# Patient Record
Sex: Female | Born: 1945 | Race: White | Hispanic: No | Smoking: Former smoker
Health system: Southern US, Community
[De-identification: ages and names within clinical notes are randomized; demographics above are authoritative.]

## PROBLEM LIST (undated history)

## (undated) ENCOUNTER — Inpatient Hospital Stay (HOSPITAL_COMMUNITY): Payer: Self-pay | Admitting: Orthopaedic Surgery

## (undated) ENCOUNTER — Encounter (HOSPITAL_COMMUNITY): Payer: Self-pay

## (undated) DIAGNOSIS — E039 Hypothyroidism, unspecified: Secondary | ICD-10-CM

## (undated) DIAGNOSIS — M169 Osteoarthritis of hip, unspecified: Secondary | ICD-10-CM

## (undated) DIAGNOSIS — I10 Essential (primary) hypertension: Secondary | ICD-10-CM

## (undated) DIAGNOSIS — E785 Hyperlipidemia, unspecified: Secondary | ICD-10-CM

## (undated) DIAGNOSIS — M1990 Unspecified osteoarthritis, unspecified site: Secondary | ICD-10-CM

## (undated) DIAGNOSIS — Z87442 Personal history of urinary calculi: Secondary | ICD-10-CM

## (undated) DIAGNOSIS — Z905 Acquired absence of kidney: Secondary | ICD-10-CM

## (undated) DIAGNOSIS — G4733 Obstructive sleep apnea (adult) (pediatric): Secondary | ICD-10-CM

## (undated) DIAGNOSIS — N390 Urinary tract infection, site not specified: Secondary | ICD-10-CM

## (undated) DIAGNOSIS — T8859XA Other complications of anesthesia, initial encounter: Secondary | ICD-10-CM

## (undated) DIAGNOSIS — Z978 Presence of other specified devices: Secondary | ICD-10-CM

## (undated) DIAGNOSIS — Z9989 Dependence on other enabling machines and devices: Secondary | ICD-10-CM

## (undated) DIAGNOSIS — R011 Cardiac murmur, unspecified: Secondary | ICD-10-CM

## (undated) DIAGNOSIS — R9389 Abnormal findings on diagnostic imaging of other specified body structures: Secondary | ICD-10-CM

## (undated) DIAGNOSIS — R519 Headache, unspecified: Secondary | ICD-10-CM

## (undated) DIAGNOSIS — R350 Frequency of micturition: Secondary | ICD-10-CM

## (undated) DIAGNOSIS — Z973 Presence of spectacles and contact lenses: Secondary | ICD-10-CM

## (undated) DIAGNOSIS — Z8639 Personal history of other endocrine, nutritional and metabolic disease: Secondary | ICD-10-CM

## (undated) DIAGNOSIS — C801 Malignant (primary) neoplasm, unspecified: Secondary | ICD-10-CM

## (undated) DIAGNOSIS — C649 Malignant neoplasm of unspecified kidney, except renal pelvis: Secondary | ICD-10-CM

## (undated) DIAGNOSIS — K219 Gastro-esophageal reflux disease without esophagitis: Secondary | ICD-10-CM

## (undated) DIAGNOSIS — M1710 Unilateral primary osteoarthritis, unspecified knee: Secondary | ICD-10-CM

## (undated) HISTORY — DX: Gastro-esophageal reflux disease without esophagitis: K21.9

## (undated) HISTORY — DX: Hypothyroidism, unspecified: E03.9

## (undated) HISTORY — DX: Osteoarthritis of hip, unspecified: M16.9

## (undated) HISTORY — DX: Malignant neoplasm of unspecified kidney, except renal pelvis (CMS-HCC): C64.9

## (undated) HISTORY — PX: COLONOSCOPY: PRO007

## (undated) HISTORY — DX: Essential (primary) hypertension: I10

## (undated) HISTORY — DX: Hyperlipidemia, unspecified: E78.5

## (undated) HISTORY — DX: Unilateral primary osteoarthritis, unspecified knee: M17.10

## (undated) HISTORY — PX: PB BIOPSY OF BREAST, INCISIONAL: 19101

## (undated) HISTORY — PX: PB REMOVE TONSILS/ADENOIDS,<12 Y/O: 42820

## (undated) HISTORY — PX: OTHER PROCEDURE: U1053

## (undated) HISTORY — PX: TOTAL HIP ARTHROPLASTY: SHX124

## (undated) HISTORY — PX: WISDOM TOOTH EXTRACTION: SHX21

## (undated) HISTORY — PX: COLON SURGERY: SHX602

## (undated) HISTORY — PX: TONSILLECTOMY: SUR1361

## (undated) HISTORY — PX: PARATHYROIDECTOMY: SHX19

## (undated) SURGERY — ARTHROPLASTY, KNEE
Site: Knee | Laterality: Right

## (undated) MED ORDER — LACTATED RINGERS IV SOLN
INTRAVENOUS | Status: AC
Start: 2015-12-22 — End: ?

## (undated) MED ORDER — SIMVASTATIN 10 MG OR TABS
10.00 mg | ORAL_TABLET | Freq: Every evening | ORAL | Status: AC
Start: 2014-01-06 — End: ?

## (undated) MED ORDER — ASPIRIN 325 MG OR TABS
325.00 mg | ORAL_TABLET | Freq: Two times a day (BID) | ORAL | Status: AC
Start: 2014-01-07 — End: ?

## (undated) MED ORDER — LIDOCAINE HCL 1 % IJ SOLN
0.10 mL | INTRAMUSCULAR | Status: AC | PRN
Start: 2013-12-24 — End: ?

## (undated) MED ORDER — LISINOPRIL-HYDROCHLOROTHIAZIDE 10-12.5 MG OR TABS
1.00 | ORAL_TABLET | Freq: Every morning | ORAL | Status: AC
Start: 2014-01-06 — End: ?

## (undated) MED ORDER — LEVOTHYROXINE SODIUM 137 MCG OR TABS
137.00 ug | ORAL_TABLET | Freq: Every morning | ORAL | Status: AC
Start: 2014-01-06 — End: ?

## (undated) MED ORDER — LIDOCAINE HCL 1 % IJ SOLN
0.10 mL | INTRAMUSCULAR | Status: AC | PRN
Start: 2015-12-22 — End: ?

## (undated) MED ORDER — NALOXONE HCL 0.4 MG/ML IJ SOLN
0.10 mg | INTRAMUSCULAR | Status: AC | PRN
Start: 2015-12-24 — End: ?

## (undated) MED ORDER — LACTATED RINGERS IV SOLN
INTRAVENOUS | Status: AC
Start: 2013-12-24 — End: ?

---

## 1993-12-05 DIAGNOSIS — E890 Postprocedural hypothyroidism: Secondary | ICD-10-CM

## 1993-12-05 HISTORY — PX: PB THYROIDECTOMY TOTAL/COMPLETE: 60240

## 1993-12-05 HISTORY — DX: Postprocedural hypothyroidism: E89.0

## 1993-12-05 HISTORY — PX: TOTAL THYROIDECTOMY: SHX2547

## 2003-12-06 HISTORY — PX: LAPAROSCOPIC CHOLECYSTECTOMY: SUR755

## 2006-12-05 HISTORY — PX: CHOLECYSTECTOMY, LAP: 56340

## 2009-03-04 ENCOUNTER — Ambulatory Visit (INDEPENDENT_AMBULATORY_CARE_PROVIDER_SITE_OTHER): Payer: Self-pay | Admitting: Orthopaedic Surgery

## 2009-03-11 ENCOUNTER — Ambulatory Visit (INDEPENDENT_AMBULATORY_CARE_PROVIDER_SITE_OTHER): Payer: Self-pay | Admitting: Orthopaedic Surgery

## 2009-03-17 ENCOUNTER — Encounter (INDEPENDENT_AMBULATORY_CARE_PROVIDER_SITE_OTHER): Payer: Self-pay | Admitting: Orthopaedic Surgery

## 2009-03-18 ENCOUNTER — Ambulatory Visit (INDEPENDENT_AMBULATORY_CARE_PROVIDER_SITE_OTHER): Admitting: Orthopaedic Surgery

## 2009-03-18 MED ORDER — LEVOTHYROXINE SODIUM 137 MCG OR TABS: 137.00 ug | ORAL_TABLET | Freq: Every morning | ORAL | Status: AC

## 2009-03-18 MED ORDER — LISINOPRIL-HYDROCHLOROTHIAZIDE 10-12.5 MG OR TABS: 1.00 | ORAL_TABLET | Freq: Every morning | ORAL | Status: AC

## 2009-03-18 NOTE — Progress Notes (Signed)
 This office note has been dictated.

## 2009-03-19 NOTE — Progress Notes (Signed)
 See Angela Calhoun's note for details. I saw and evaluated the patient. I  have discussed the patient with the [facility] and agree with his findings and  the plan we developed as written.    CLINIC: ORTHOPEDICS [place]      REPORT TYPE: NOTE      Dictating Practitioner: Inda Mcglothen L. Reedy Biernat, P.A.     Staff Physician: Samara Deist, M.D.    DATE OF SERVICE: 03/18/2009    REASON FOR VISIT:Right hip and left knee pain.            HISTORY OF PRESENT ILLNESS: This patient is a pleasant 63 year old female  who presents to orthopedic clinic today for the first time for evaluation  of right hip and left knee pain. She has a long history of progressive  right anterior hip pain dating back just over 5 years. She denies any  specific injury to the right hip, simply progressive pain and now  increasing stiffness and disability. She says she has primarily anterior  hip pain which radiates down into the groin as well as significant  difficulty getting her shoe and sock on. She does take over-the-counter  anti-inflammatories to help with this pain, but is having more and more  difficulty controlling her pain. She denies any specific leg length  discrepancy and denies any radicular component to her pain.    She also reports increasing left knee pain over the last year. She  localizes the pain primarily medially. She says it is not quite as bad as  her hip pain but now rates it about 3/10 in intensity. She denies any  specific instability or swelling. She does report some decreased range of  motion in the knee, but overall it is still better than her hip. She has  not had any injections in her hip or her knee, has not had any formal  physical therapy, and again takes only over-the-counter  anti-inflammatories.    PAST MEDICAL HISTORY: Significant for hypothyroidism, as well as  hypertension.    PAST SURGICAL HISTORY: Noncontributory.    FAMILY HISTORY: Noncontributory.    SOCIAL HISTORY: She is married. She is retired. She does have  no  children. She is a nonsmoker and denies illicit drug use. She eats no  special diet.    REVIEW OF SYSTEMS: An 18-point review of systems is well documented on her  chart. She does have a history of hypertension which is well controlled by  her report. Otherwise 18-point review of systems is negative.    PHYSICAL EXAMINATION:  On examination, this is a well-nourished, well-developed 63 year old female  in no current distress. She is 5 feet 7 inches tall, weighs 240 pounds.  Her BMI is nearly 38. Her blood pressure is 146/90 today. Temperature is  97.1, pulse is 73. She does walk with a markedly antalgic gait and has  moderate difficulty climbing onto and off of the examination table today.  Examination of the lower extremities reveals intact skin and nontender  varicosities. The leg lengths are essentially equal to apparent leg length  testing today. Hip range of motion testing from right to left reveals  flexion to 90/100, flexion contracture 0/0, abduction 30/60, adduction  5/10, internal rotation 0/15 and external rotation 30/40. She does have  markedly positive impingement signs in the right hip as well as a positive  Stinchfield test today. She has minimal tenderness over the greater  trochanter today. Examination of the left knee reveals mild varus  deformity.  She is tender along the medial joint line. She has mild  pseudo-laxity with valgus stress but no AP instability. Range of motion is  from 5 to 125 degrees. There is no effusion today. Distal strength and  sensation is well-maintained and distal pulses are 2+ sign.    IMAGING STUDIES: Plain radiographs of the right hip and pelvis obtained  today demonstrate advanced osteoarthritis of the right hip with joint space  narrowing and osteophyte formation. The left hip shows only mild  degenerative changes. Radiographs of the left knee obtained today  including AP, lateral and merchant view demonstrate significant medial  compartment osteoarthritis with  nearly complete loss of articular cartilage  space as well as osteophyte formation and subchondral sclerosis.    IMPRESSION:  1. Right hip pain secondary to osteoarthritis.  2. Left knee pain secondary to medial compartment osteoarthritis.    PLAN: We have had a long discussion with the patient today regarding  clinical and radiographic findings. She has symptomatic right hip  osteoarthritis as well as symptomatic left knee osteoarthritis. Although  historically and on examination today it appears that her hip is far more  symptomatic than her knee. We have talked about treatment options for both  her hip and knee arthritis today. These include activity modifications,  physical therapy, anti-inflammatories, weight loss, corticosteroid  injections or ultimately hip or knee replacement. At this point it appears  that since her hip is the most symptomatic, we would consider proceeding  with total hip arthroplasty first. We have talked about the importance of  losing some weight prior to surgery and we have discussed the fact that we  would prefer that her BMI was down under 35 prior to proceeding with any  kind of surgical intervention. She actually is a very understanding of  this and was hoping to lose some weight prior to surgery anyway. We have  talked about appropriate activities including stationary cycling as well as  swimming to help her lose some weight and avoid significant impact loading  on her lower extremity joints. We have also talked about the appropriate  use of anti-inflammatories to help control her symptoms. At this point,  she is not interested in any kind of injections in her knee or her hip. We  have talked about total hip arthroplasty including the risks, benefits and  alternatives today and will plan on bringing her back in about 3 months to  evaluate her weight loss goals and at that point likely begin the process  of scheduling a right total hip arthroplasty. The patient was seen  and  evaluated by Dr. Heide Spark and the plan was formulated and presented to  the patient by Dr. Newman Pies.      Job #: [id number]                Edited and Electronically signed by:  Samara Deist, M.D. 03/19/2009 01:11 P    DD: 03/18/2009 DT: 03/18/2009 04:09 P DocNo.: [id number]  [id number]      Primary Care Physician:  Bishop Limbo M.D  [phone] [address]  [place], [place] [address]    cc::

## 2009-06-17 ENCOUNTER — Ambulatory Visit (INDEPENDENT_AMBULATORY_CARE_PROVIDER_SITE_OTHER): Admitting: Orthopaedic Surgery

## 2009-06-17 MED ORDER — NAPROXEN SODIUM 220 MG OR TABS
220.00 mg | ORAL_TABLET | Freq: Three times a day (TID) | ORAL | Status: DC | PRN
Start: ? — End: 2013-12-24

## 2009-06-17 MED ORDER — OMEPRAZOLE 20 MG OR CPDR
20.00 mg | DELAYED_RELEASE_CAPSULE | Freq: Every morning | ORAL | Status: DC
Start: ? — End: 2019-01-22

## 2009-06-17 MED ORDER — SIMVASTATIN 20 MG OR TABS
20.00 mg | ORAL_TABLET | Freq: Every evening | ORAL | Status: DC
Start: ? — End: 2013-12-24

## 2009-06-17 NOTE — Patient Instructions (Signed)
Joint Replacement Surgery Scheduling    Angela Calhoun  Surgery Scheduler for Dr. Scott Ball  Office: [phone]  Fax: [phone]  [email or web address]    INSTRUCTIONS:  -contact my surgery scheduler to begin the surgery scheduling process.  -Please have your calender ready when discussing surgery dates.  -Depending on your age/health, you may need preoperative medical clearance by your primary care physician or specialist.  Please contact your primary care physician as soon as possible.    [facility], [facility], [facility] [place]  [address]   [place], [place] [address]  [phone]    [facility]  [address]  [place], [place] [address]  [phone]

## 2009-06-17 NOTE — Progress Notes (Signed)
 Subjective: Angela Calhoun returns today for recheck of her right hip and followup on her efforts on weight loss prior to considering any surgical intervention.  She has lost between 5 and 10 pounds and continues to work hard with dietary efforts.  She continues to experience rather significant right hip pain and does intermittently have left knee but her right hip is far more symptomatic.  She returns today for further discussion of right total hip replacement.  She has been trying to ride a stationary bike but is having difficulty doing this regularly due to hip pain.  She describes anterior hip pain and stiffness progressive disability.    PAST MEDICAL HISTORY: Significant for hypothyroidism, as well as  hypertension.    PAST SURGICAL HISTORY: Noncontributory.    FAMILY HISTORY: Noncontributory.    SOCIAL HISTORY: She is married. She is retired. She does have no  children. She is a nonsmoker and denies illicit drug use. She eats no  special diet.    REVIEW OF SYSTEMS: An 18-point review of systems is well documented on her  chart. She does have a history of hypertension which is well controlled by  her report. Otherwise 18-point review of systems is negative.    PHYSICAL EXAMINATION:  On examination, this is a well-nourished, well-developed 63 year old female  in no current distress. She is 5 feet 7 inches tall, weighs 240 pounds.  Her BMI is nearly 38. Her blood pressure is 146/90 today. Temperature is  97.1, pulse is 73. She does walk with a markedly antalgic gait and has  moderate difficulty climbing onto and off of the examination table today.  Examination of the lower extremities reveals intact skin and nontender  varicosities. The leg lengths are essentially equal to apparent leg length  testing today. Hip range of motion testing from right to left reveals  flexion to 90/100, flexion contracture 0/0, abduction 30/60, adduction  5/10, internal rotation 0/15 and external rotation 30/40. She  does have  markedly positive impingement signs in the right hip as well as a positive  Stinchfield test today. She has minimal tenderness over the greater  trochanter today. Examination of the left knee reveals mild varus  deformity. She is tender along the medial joint line. She has mild  pseudo-laxity with valgus stress but no AP instability. Range of motion is  from 5 to 125 degrees. There is no effusion today. Distal strength and  sensation is well-maintained and distal pulses are 2+ sign.    IMAGING STUDIES: Plain radiographs of the right hip and pelvis obtained  today demonstrate advanced osteoarthritis of the right hip with joint space  narrowing and osteophyte formation. The left hip shows only mild  degenerative changes. Radiographs of the left knee obtained today  including AP, lateral and merchant view demonstrate significant medial  compartment osteoarthritis with nearly complete loss of articular cartilage  space as well as osteophyte formation and subchondral sclerosis.    Plan: We have had a long discussion today regarding clinical and radiographic findings.  She certainly has endstage osteoarthritis of her right hip radiographically and has been trying hard to lose weight as per our recommendations.  She has been making some progress and we will go ahead and begin the scheduling process for her today.  We have requested that she continue to work hard on her weight loss efforts right up to the time of surgery.  At this point she would like to hold on any intervention for her left knee and will  proceed with a right total hip arthroplasty.  She has been provided with a class schedule today and has been provided a handout on total hip placement for her education.  All of her questions were answered today and she was provided Judeen Hammans his contact information.The patient was seen and evaluated by Dr. Newman Pies and the plan was formulated by Dr. Newman Pies and presented to the patient by him.

## 2009-06-19 NOTE — Progress Notes (Signed)
 ORTHO STAFF ADDENDUM:  See Magnus Sinning' note for details.  I saw and evaluated the patient.  I have discussed the patient with [facility] and agree with his findings and the plan we developed as written.    Angela Calhoun has significant right hip arthritis. She is overweight but has been making some progress toward losing weight.  We will start the scheduling process and she assured me she will continue with her weight loss program.  We discussed risks/benefits/rehab/expected outcomes of surgery today.  I answered all of her questions and provided her with a booklet about the surgery and also a [place] joint replacement class schedule.

## 2009-06-22 ENCOUNTER — Encounter (INDEPENDENT_AMBULATORY_CARE_PROVIDER_SITE_OTHER): Payer: Self-pay | Admitting: Orthopaedic Surgery

## 2009-09-09 ENCOUNTER — Telehealth (INDEPENDENT_AMBULATORY_CARE_PROVIDER_SITE_OTHER): Payer: Self-pay | Admitting: Orthopaedic Surgery

## 2009-09-29 ENCOUNTER — Encounter (INDEPENDENT_AMBULATORY_CARE_PROVIDER_SITE_OTHER): Payer: Self-pay | Admitting: Physician Assistant

## 2009-09-29 ENCOUNTER — Encounter (INDEPENDENT_AMBULATORY_CARE_PROVIDER_SITE_OTHER): Payer: Self-pay | Admitting: Anesthesiology

## 2009-11-11 ENCOUNTER — Encounter (INDEPENDENT_AMBULATORY_CARE_PROVIDER_SITE_OTHER): Payer: Self-pay | Admitting: Orthopaedic Surgery

## 2009-12-05 HISTORY — PX: OTHER PROCEDURE: U1053

## 2010-01-26 ENCOUNTER — Encounter (INDEPENDENT_AMBULATORY_CARE_PROVIDER_SITE_OTHER): Payer: Self-pay | Admitting: Orthopaedic Surgery

## 2010-01-28 ENCOUNTER — Ambulatory Visit (INDEPENDENT_AMBULATORY_CARE_PROVIDER_SITE_OTHER): Admitting: Orthopaedic Surgery

## 2010-01-28 VITALS — BP 113/57 | HR 57 | Temp 97.1°F | Ht 67.0 in | Wt 246.0 lb

## 2010-01-28 MED ORDER — HYDROCODONE-ACETAMINOPHEN 5-500 MG OR TABS
1.0000 | ORAL_TABLET | Freq: Four times a day (QID) | ORAL | Status: DC | PRN
Start: 2010-01-28 — End: 2010-03-11

## 2010-02-23 ENCOUNTER — Ambulatory Visit (INDEPENDENT_AMBULATORY_CARE_PROVIDER_SITE_OTHER): Admitting: Physician Assistant

## 2010-02-23 ENCOUNTER — Encounter (INDEPENDENT_AMBULATORY_CARE_PROVIDER_SITE_OTHER): Payer: Self-pay | Admitting: Physician Assistant

## 2010-02-23 ENCOUNTER — Encounter (INDEPENDENT_AMBULATORY_CARE_PROVIDER_SITE_OTHER): Admitting: Anesthesiology

## 2010-02-23 LAB — URINALYSIS
Bilirubin: NEGATIVE
Blood: NEGATIVE
Glucose: NEGATIVE
Ketones: NEGATIVE
Leuk Esterase: NEGATIVE
Nitrite: NEGATIVE
Protein: NEGATIVE
Specific Gravity: 1.008 (ref 1.002–1.030)
pH: 5 (ref 5.0–8.0)

## 2010-02-23 LAB — BASIC METABOLIC PANEL, BLOOD
BUN: 19 mg/dL (ref 6–20)
Bicarbonate: 32 mmol/L — ABNORMAL HIGH (ref 22–29)
Calcium: 9.8 mg/dL (ref 8.6–10.5)
Chloride: 101 mmol/L (ref 98–107)
Creatinine: 0.69 mg/dL (ref 0.51–0.95)
Glucose: 86 mg/dL (ref 70–115)
Potassium: 4.1 mmol/L (ref 3.5–5.1)
Sodium: 139 mmol/L (ref 136–145)

## 2010-02-23 LAB — CBC WITH DIFF, BLOOD
Abs Eosinophils: 0.2 10*3/uL (ref 0.0–0.5)
Abs Lymphs: 3 10*3/uL (ref 0.8–3.1)
Abs Monos: 0.5 10*3/uL (ref 0.2–0.8)
Absolute Neutrophil Count: 6.1 10*3/uL (ref 1.6–7.0)
Eosinophils: 2 % (ref 1–7)
Hct: 42.2 % (ref 34.0–45.0)
Hgb: 13.5 gm/dL (ref 11.2–15.7)
Imm Gran %: 1 % (ref 0–1)
Imm Gran Abs: 0.1 10*3/uL (ref 0.0–0.1)
Lymphocytes: 30 % (ref 19–53)
MCH: 27.6 pg (ref 26.0–32.0)
MCHC: 32 % (ref 32.0–36.0)
MCV: 86.3 um3 (ref 79.0–95.0)
MPV: 10.3 fL (ref 9.4–12.4)
Monocytes: 5 % (ref 5–12)
Plt Count: 198 10*3/uL (ref 160–370)
RBC: 4.89 10*6/uL (ref 3.90–5.20)
RDW: 15.3 % — ABNORMAL HIGH (ref 12.0–14.0)
Segs: 62 % (ref 34–71)
WBC: 9.8 10*3/uL (ref 4.0–10.0)

## 2010-02-23 LAB — PROTHROMBIN TIME, BLOOD
INR: 1
PT,Patient: 11.6 s (ref 9.7–12.5)

## 2010-02-23 LAB — SED RATE, BLOOD: Sed Rate: 16 mm/hr (ref 0–30)

## 2010-02-23 LAB — GFR: GFR: >60 mL/min

## 2010-02-24 LAB — MRSA SCREEN CULTURE

## 2010-02-25 LAB — URINE CULTURE

## 2010-03-01 LAB — ECG, COMPLETE (HC/~~LOC~~/ENCINITAS)
ATRIAL RATE: 64 {beats}/min
P AXIS: 61 degrees
PR INTERVAL: 146 ms
QRS INTERVAL/DURATION: 80 ms
QT: 396 ms
QTc (Bazett): 408 ms
R AXIS: 35 degrees
T AXIS: 39 degrees
VENTRICULAR RATE: 64 {beats}/min

## 2010-03-08 ENCOUNTER — Inpatient Hospital Stay
Admit: 2010-03-08 | Discharge: 2010-03-11 | Disposition: A | Discharge status: Home Health | Payer: Self-pay | Attending: Orthopaedic Surgery | Admitting: Orthopaedic Surgery

## 2010-03-08 ENCOUNTER — Encounter (HOSPITAL_COMMUNITY): Payer: Self-pay | Admitting: Orthopaedic Surgery

## 2010-03-08 DIAGNOSIS — K219 Gastro-esophageal reflux disease without esophagitis: Secondary | ICD-10-CM | POA: Insufficient documentation

## 2010-03-08 DIAGNOSIS — M169 Osteoarthritis of hip, unspecified: Principal | ICD-10-CM

## 2010-03-08 DIAGNOSIS — I10 Essential (primary) hypertension: Secondary | ICD-10-CM | POA: Insufficient documentation

## 2010-03-08 DIAGNOSIS — E039 Hypothyroidism, unspecified: Secondary | ICD-10-CM | POA: Insufficient documentation

## 2010-03-08 MED ORDER — OXYCODONE HCL 10 MG OR TB12
10.0000 mg | ORAL_TABLET | Freq: Two times a day (BID) | ORAL | Status: DC
Start: 2010-03-08 — End: 2010-03-11
  Administered 2010-03-08 – 2010-03-11 (×6): 10 mg via ORAL
  Filled 2010-03-08 (×6): qty 1

## 2010-03-08 MED ORDER — HYDROMORPHONE HCL 1 MG/ML IJ SOLN
0.4000 mg | INTRAMUSCULAR | Status: DC | PRN
Start: 2010-03-08 — End: 2010-03-08

## 2010-03-08 MED ORDER — FENTANYL CITRATE 0.05 MG/ML IJ SOLN
25.0000 ug | INTRAMUSCULAR | Status: DC | PRN
Start: 2010-03-08 — End: 2010-03-08

## 2010-03-08 MED ORDER — SODIUM CHLORIDE 0.9 % IV SOLN
12.5000 mg | Freq: Once | INTRAVENOUS | Status: DC | PRN
Start: 2010-03-08 — End: 2010-03-08

## 2010-03-08 MED ORDER — BISACODYL 10 MG RE SUPP
10.0000 mg | Freq: Every day | RECTAL | Status: DC | PRN
Start: 2010-03-10 — End: 2010-03-11

## 2010-03-08 MED ORDER — SIMVASTATIN 20 MG OR TABS
20.0000 mg | ORAL_TABLET | Freq: Every evening | ORAL | Status: DC
Start: 2010-03-08 — End: 2010-03-11
  Administered 2010-03-08 – 2010-03-10 (×3): 20 mg via ORAL
  Filled 2010-03-08 (×3): qty 1

## 2010-03-08 MED ORDER — LACTATED RINGERS IV SOLN
INTRAVENOUS | Status: DC
Start: 2010-03-08 — End: 2010-03-08

## 2010-03-08 MED ORDER — MAGNESIUM HYDROXIDE 2400 MG/10ML PO SUSP
10.0000 mL | Freq: Two times a day (BID) | ORAL | Status: DC | PRN
Start: 2010-03-09 — End: 2010-03-11

## 2010-03-08 MED ORDER — MENTHOL 3 MG MT LOZG
1.0000 | LOZENGE | Freq: Three times a day (TID) | OROMUCOSAL | Status: DC | PRN
Start: 2010-03-08 — End: 2010-03-11

## 2010-03-08 MED ORDER — ONDANSETRON HCL 4 MG/2ML IV SOLN
4.0000 mg | Freq: Four times a day (QID) | INTRAMUSCULAR | Status: DC | PRN
Start: 2010-03-08 — End: 2010-03-11

## 2010-03-08 MED ORDER — ZOLPIDEM TARTRATE 5 MG OR TABS
5.0000 mg | ORAL_TABLET | Freq: Every evening | ORAL | Status: DC | PRN
Start: 2010-03-08 — End: 2010-03-11

## 2010-03-08 MED ORDER — NALOXONE HCL 0.4 MG/ML IJ SOLN
0.1000 mg | INTRAMUSCULAR | Status: DC | PRN
Start: 2010-03-08 — End: 2010-03-11

## 2010-03-08 MED ORDER — LANSOPRAZOLE 30 MG OR CPDR
30.0000 mg | DELAYED_RELEASE_CAPSULE | Freq: Every day | ORAL | Status: DC
Start: 2010-03-09 — End: 2010-03-11
  Administered 2010-03-09 – 2010-03-11 (×3): 30 mg via ORAL
  Filled 2010-03-08 (×3): qty 1

## 2010-03-08 MED ORDER — BUPIVACAINE HCL 0.75 % IJ SOLN
INTRAMUSCULAR | Status: DC
Start: 2010-03-08 — End: 2010-03-10
  Filled 2010-03-08 (×2): qty 2.5

## 2010-03-08 MED ORDER — NALBUPHINE HCL 10 MG/ML IJ SOLN
1.5000 mg | INTRAMUSCULAR | Status: DC | PRN
Start: 2010-03-08 — End: 2010-03-11

## 2010-03-08 MED ORDER — OXYCODONE HCL 5 MG OR TABS
5.0000 mg | ORAL_TABLET | ORAL | Status: DC | PRN
Start: 2010-03-08 — End: 2010-03-11

## 2010-03-08 MED ORDER — MULTIVITAMINS OR TABS
1.0000 | ORAL_TABLET | Freq: Every day | ORAL | Status: DC
Start: 2010-03-08 — End: 2010-03-11
  Administered 2010-03-09 – 2010-03-11 (×3): 1 via ORAL
  Filled 2010-03-08 (×3): qty 1

## 2010-03-08 MED ORDER — ENOXAPARIN SODIUM 40 MG/0.4ML SC SOLN
40.0000 mg | Freq: Every day | SUBCUTANEOUS | Status: DC
Start: 2010-03-09 — End: 2010-03-11
  Administered 2010-03-09 – 2010-03-11 (×3): 40 mg via SUBCUTANEOUS
  Filled 2010-03-08 (×3): qty 0.4

## 2010-03-08 MED ORDER — ACETAMINOPHEN 325 MG PO TABS
650.0000 mg | ORAL_TABLET | ORAL | Status: DC | PRN
Start: 2010-03-08 — End: 2010-03-11

## 2010-03-08 MED ORDER — NARCOTIC DRIP (FOR PYXIS) PLACEHOLDER
Status: AC
Start: 2010-03-08 — End: 2010-03-09
  Filled 2010-03-08: qty 1

## 2010-03-08 MED ORDER — DEXTROSE-KCL-NACL 5-20-0.45 %-MEQ/L-% IV SOLN
INTRAVENOUS | Status: DC
Start: 2010-03-08 — End: 2010-03-11

## 2010-03-08 MED ORDER — HYDROCHLOROTHIAZIDE 25 MG OR TABS
12.5000 mg | ORAL_TABLET | Freq: Every day | ORAL | Status: DC
Start: 2010-03-08 — End: 2010-03-11
  Administered 2010-03-11: 12.5 mg via ORAL
  Filled 2010-03-08 (×3): qty 1

## 2010-03-08 MED ORDER — LISINOPRIL 10 MG OR TABS
10.0000 mg | ORAL_TABLET | Freq: Every day | ORAL | Status: DC
Start: 2010-03-08 — End: 2010-03-11
  Administered 2010-03-09 – 2010-03-11 (×3): 10 mg via ORAL
  Filled 2010-03-08 (×3): qty 1

## 2010-03-08 MED ORDER — DOCUSATE SODIUM 250 MG OR CAPS
250.0000 mg | ORAL_CAPSULE | Freq: Two times a day (BID) | ORAL | Status: DC
Start: 2010-03-08 — End: 2010-03-11
  Administered 2010-03-08 – 2010-03-11 (×6): 250 mg via ORAL
  Filled 2010-03-08 (×7): qty 1

## 2010-03-08 MED ORDER — PSYLLIUM 100 % OR PACK
1.0000 | PACK | Freq: Every day | ORAL | Status: DC
Start: 2010-03-09 — End: 2010-03-11
  Administered 2010-03-09 – 2010-03-11 (×3): 6 g via ORAL
  Filled 2010-03-08 (×3): qty 1

## 2010-03-08 MED ORDER — LISINOPRIL-HYDROCHLOROTHIAZIDE 10-12.5 MG OR TABS
1.0000 | ORAL_TABLET | Freq: Every day | ORAL | Status: DC
Start: 2010-03-08 — End: 2010-03-08

## 2010-03-08 MED ORDER — HYDRALAZINE HCL 20 MG/ML IJ SOLN
5.0000 mg | INTRAMUSCULAR | Status: DC | PRN
Start: 2010-03-08 — End: 2010-03-08

## 2010-03-08 MED ORDER — SODIUM CHLORIDE 0.9 % IV SOLN
2000.0000 mg | Freq: Three times a day (TID) | INTRAVENOUS | Status: AC
Start: 2010-03-08 — End: 2010-03-08
  Administered 2010-03-08 (×2): 2000 mg via INTRAVENOUS
  Filled 2010-03-08 (×2): qty 2000

## 2010-03-08 MED ORDER — CELECOXIB 200 MG OR CAPS
200.0000 mg | ORAL_CAPSULE | Freq: Two times a day (BID) | ORAL | Status: DC
Start: 2010-03-08 — End: 2010-03-11
  Administered 2010-03-08 – 2010-03-11 (×6): 200 mg via ORAL
  Filled 2010-03-08 (×6): qty 1

## 2010-03-08 MED ORDER — ACETAMINOPHEN 325 MG PO TABS
650.0000 mg | ORAL_TABLET | Freq: Four times a day (QID) | ORAL | Status: AC
Start: 2010-03-08 — End: 2010-03-10
  Administered 2010-03-08 – 2010-03-10 (×7): 650 mg via ORAL
  Filled 2010-03-08 (×7): qty 2

## 2010-03-08 MED ORDER — HYDROMORPHONE HCL 1 MG/ML IJ SOLN
0.5000 mg | INTRAMUSCULAR | Status: DC | PRN
Start: 2010-03-08 — End: 2010-03-11

## 2010-03-08 MED ORDER — ONDANSETRON HCL 4 MG/2ML IV SOLN
4.0000 mg | Freq: Once | INTRAMUSCULAR | Status: DC | PRN
Start: 2010-03-08 — End: 2010-03-08

## 2010-03-08 MED ORDER — OXYCODONE HCL 5 MG OR TABS
10.0000 mg | ORAL_TABLET | ORAL | Status: DC | PRN
Start: 2010-03-08 — End: 2010-03-11

## 2010-03-08 MED ORDER — NALOXONE HCL 0.4 MG/ML IJ SOLN
0.1000 mg | INTRAMUSCULAR | Status: DC | PRN
Start: 2010-03-08 — End: 2010-03-08

## 2010-03-08 MED ORDER — LABETALOL HCL 5 MG/ML IV SOLN
5.0000 mg | INTRAVENOUS | Status: DC | PRN
Start: 2010-03-08 — End: 2010-03-08

## 2010-03-08 MED ORDER — FLEET ADULT ENEMA 7-19 GM/118ML RE ENEM
1.0000 | ENEMA | Freq: Every day | RECTAL | Status: DC | PRN
Start: 2010-03-11 — End: 2010-03-11

## 2010-03-08 MED ORDER — LEVOTHYROXINE SODIUM 137 MCG OR TABS
137.0000 ug | ORAL_TABLET | Freq: Every day | ORAL | Status: DC
Start: 2010-03-08 — End: 2010-03-11
  Administered 2010-03-10 – 2010-03-11 (×2): 137 ug via ORAL
  Filled 2010-03-08 (×3): qty 1

## 2010-03-08 MED ORDER — DIPHENHYDRAMINE HCL 50 MG/ML IJ SOLN
12.5000 mg | Freq: Once | INTRAMUSCULAR | Status: DC | PRN
Start: 2010-03-08 — End: 2010-03-08

## 2010-03-08 NOTE — Interdisciplinary (Signed)
Respirations    Pt's respiratory rate has dropped as low as 3 breaths/min per monitor. Paged first MD on call to notify them. Count pt's respirations manually and obtained 8 breaths/min at the least. Respirations 9-12 breaths/min otherwise. Pt easily arousable. Continue to monitor.

## 2010-03-09 DIAGNOSIS — Z96649 Presence of unspecified artificial hip joint: Secondary | ICD-10-CM | POA: Insufficient documentation

## 2010-03-09 LAB — HEMOGRAM, BLOOD
Hct: 31.6 % — ABNORMAL LOW (ref 34.0–45.0)
Hgb: 10.1 g/dL — ABNORMAL LOW (ref 11.2–15.7)
MCH: 26.9 pg (ref 26.0–32.0)
MCHC: 32 % (ref 32.0–36.0)
MCV: 84 um3 (ref 79.0–95.0)
MPV: 10.5 fL (ref 9.4–12.4)
Plt Count: 159 10*3/uL — ABNORMAL LOW (ref 160–370)
RBC: 3.76 10*6/uL — ABNORMAL LOW (ref 3.90–5.20)
RDW: 15.3 % — ABNORMAL HIGH (ref 12.0–14.0)
WBC: 9.6 10*3/uL (ref 4.0–10.0)

## 2010-03-09 LAB — BASIC METABOLIC PANEL, BLOOD
BUN: 15 mg/dL (ref 6–20)
Bicarbonate: 23 mmol/L (ref 22–29)
Calcium: 8.4 mg/dL — ABNORMAL LOW (ref 8.6–10.5)
Chloride: 100 mmol/L (ref 98–107)
Creatinine: 0.64 mg/dL (ref 0.51–0.95)
Glucose: 118 mg/dL — ABNORMAL HIGH (ref 70–115)
Potassium: 3.9 mmol/L (ref 3.5–5.1)
Sodium: 133 mmol/L — ABNORMAL LOW (ref 136–145)

## 2010-03-09 LAB — GFR: GFR: >60 mL/min

## 2010-03-11 MED ORDER — OXYCODONE HCL 5 MG OR TABS
5.0000 mg | ORAL_TABLET | ORAL | Status: DC | PRN
Start: 2010-03-11 — End: 2013-12-24

## 2010-03-11 MED ORDER — DOCUSATE SODIUM 250 MG OR CAPS
250.0000 mg | ORAL_CAPSULE | Freq: Two times a day (BID) | ORAL | Status: DC
Start: 2010-03-11 — End: 2013-12-24

## 2010-03-11 MED ORDER — ENOXAPARIN SODIUM 40 MG/0.4ML SC SOLN
40.0000 mg | Freq: Every day | SUBCUTANEOUS | Status: DC
Start: 2010-03-11 — End: 2013-12-24

## 2010-03-11 MED ORDER — OXYCODONE HCL 10 MG OR TB12
10.0000 mg | ORAL_TABLET | Freq: Two times a day (BID) | ORAL | Status: DC
Start: 2010-03-11 — End: 2013-12-24

## 2010-03-18 ENCOUNTER — Telehealth (INDEPENDENT_AMBULATORY_CARE_PROVIDER_SITE_OTHER): Payer: Self-pay | Admitting: Orthopaedic Surgery

## 2010-03-31 ENCOUNTER — Encounter (INDEPENDENT_AMBULATORY_CARE_PROVIDER_SITE_OTHER): Payer: Self-pay | Admitting: Orthopaedic Surgery

## 2010-04-08 ENCOUNTER — Ambulatory Visit (INDEPENDENT_AMBULATORY_CARE_PROVIDER_SITE_OTHER): Admitting: Orthopaedic Surgery

## 2010-04-08 MED ORDER — TYLENOL PO
650.00 mg | Freq: Four times a day (QID) | ORAL | Status: DC | PRN
Start: ? — End: 2015-12-25

## 2010-05-05 ENCOUNTER — Telehealth (INDEPENDENT_AMBULATORY_CARE_PROVIDER_SITE_OTHER): Payer: Self-pay | Admitting: Orthopaedic Surgery

## 2010-05-07 ENCOUNTER — Telehealth (INDEPENDENT_AMBULATORY_CARE_PROVIDER_SITE_OTHER): Payer: Self-pay | Admitting: Orthopaedic Surgery

## 2010-05-07 DIAGNOSIS — M25559 Pain in unspecified hip: Secondary | ICD-10-CM

## 2010-05-07 DIAGNOSIS — Z96649 Presence of unspecified artificial hip joint: Secondary | ICD-10-CM

## 2010-05-07 DIAGNOSIS — M1610 Unilateral primary osteoarthritis, unspecified hip: Secondary | ICD-10-CM

## 2010-05-07 MED ORDER — AMOXICILLIN 500 MG OR CAPS
2000.0000 mg | ORAL_CAPSULE | ORAL | Status: DC
Start: 2010-05-07 — End: 2014-01-08

## 2010-06-23 ENCOUNTER — Encounter (INDEPENDENT_AMBULATORY_CARE_PROVIDER_SITE_OTHER): Payer: Self-pay | Admitting: Orthopaedic Surgery

## 2010-06-23 ENCOUNTER — Ambulatory Visit (INDEPENDENT_AMBULATORY_CARE_PROVIDER_SITE_OTHER): Admitting: Orthopaedic Surgery

## 2010-06-23 NOTE — Progress Notes (Signed)
 Post-op Total Hip Visit      Subjective: The patient returns today now approximately 3 months following Right total hip arthroplasty. The patient is ambulating with no support. The patient reports she is taking 0 pain pills a day. The patient rates her pain at 0. The patient denies problems with the incision or any fevers, chills or night sweats.    Medications: Current outpatient prescriptions   Medication Sig Dispense Refill   . amoxicillin (AMOXIL) 500 MG capsule Take 4 capsules by mouth 1 hour before procedure.  20 capsule  2   . oxyCODONE (ROXICODONE) 5 MG immediate release tablet Take 1 tablet by mouth every 4 hours as needed for Severe Pain (Pain Score 7-10). 1-2 tabs by mouth every 4 hours as needed for pain.  100 tablet  0   . simvastatin (ZOCOR) 20 MG tablet Take 20 mg by mouth every evening.       Marland Kitchen omeprazole (PRILOSEC) 20 MG capsule Take 20 mg by mouth daily.       Marland Kitchen levothyroxine (LEVOXYL) 137 MCG tablet Take 137 mcg by mouth daily.       Marland Kitchen lisinopril-hydrochlorothiazide (PRINZIDE, ZESTORETIC) 10-12.5 MG per tablet Take 1 Tab by mouth daily.       . Acetaminophen (TYLENOL PO) as needed.       . enoxaparin (LOVENOX) 40 MG/0.4ML injection Inject 0.4 mLs under the skin daily.  12 Syringe  0   . oxyCODONE (OXYCONTIN) 10 MG CR tablet Take 1 tablet by mouth every 12 hours.  20 tablet  0   . docusate sodium (COLACE) 250 MG capsule Take 1 capsule by mouth 2 times daily. For use while on narcotic pain medication post-operatively.  Hold for diarrhea  60 capsule  0   . naproxen (ALEVE) 220 MG tablet Take 220 mg by mouth every 8 hours as needed. 2 tabs twice a day               Allergies:  is allergic to adhesive tape.     Objective:   BP 121/84  Pulse 77  Temp(Src) 97.2 F (36.2 C) (Oral)  Ht 5\' 7"  (1.702 m)  Wt 111.131 kg (245 lb)  BMI 38.37 kg/m2   On examination this is a well nourished, well developed female in no current distress. Examination of the Right hip reveals a well healed incision without  evidence of erythema or infection. The leg lengths are equal. Range of motion testing reveals flexion to 80, abduction to 30, internal rotation to 30 and external rotation to 20. The calf is supple and non-tender. Distal strength and sensation is well maintained. Pulses at the dorsalis pedis and posterior tibial arteries are 2+.    Imaging: Radiographs of the Right hip, including AP pelvis and  frog leg lateral demonstrate a cementless total hip in excellent position. There is no evidence of fracture, dislocation, subsidence or implant failure.    Impression: Status post Right total hip arthroplasty. 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    Plan: We have again discussed post-operative instructions. Directions related to a home exercise program have been discussed with the patient. The patient may gradually increase her level of activity as tolerated. We have discussed antibiotics for dental prophylaxis and the patient was provided with a prescription for amoxicillin. Posterior hip precautions have been reviewed. All of the patient's questions were answered. We will plan on seeing the patient back at her one year  anniversary. If any problems develop prior to that time the patient will let us know and we can see her right away. The patient also has medial compartment osteoarthritis of the left knee and left hip pain, we discussed with the patient that if she needs surgical intervention for either her knee or hip prior to her one year anniversary and that we would be happy to see her sooner and the patient was also counseled that if she were having problems we will be happy to see her sooner.

## 2010-06-25 ENCOUNTER — Encounter (INDEPENDENT_AMBULATORY_CARE_PROVIDER_SITE_OTHER): Payer: Self-pay | Admitting: Orthopaedic Surgery

## 2010-07-08 ENCOUNTER — Encounter (INDEPENDENT_AMBULATORY_CARE_PROVIDER_SITE_OTHER): Payer: Self-pay | Admitting: Orthopaedic Surgery

## 2010-07-12 NOTE — Progress Notes (Signed)
 ORTHO STAFF ADDENDUM:  See the resident's note for details.  I saw and evaluated the patient.  I have discussed the patient with the resident and agree with his findings and the plan we developed as written.    Doing well with R THA.  Some Left knee and Left  Hip pain but R THA doing great.  F/u at one year anniversary or sooner PRN if she wishes to proceed w treatment of the left knee.

## 2012-12-05 DIAGNOSIS — Z85528 Personal history of other malignant neoplasm of kidney: Secondary | ICD-10-CM

## 2012-12-05 HISTORY — PX: NEPHRECTOMY: SHX65

## 2012-12-05 HISTORY — PX: TOTAL KNEE ARTHROPLASTY: SHX125

## 2012-12-05 HISTORY — DX: Personal history of other malignant neoplasm of kidney: Z85.528

## 2013-06-06 ENCOUNTER — Encounter (INDEPENDENT_AMBULATORY_CARE_PROVIDER_SITE_OTHER): Payer: Self-pay | Admitting: Orthopaedic Surgery

## 2013-06-18 ENCOUNTER — Encounter (INDEPENDENT_AMBULATORY_CARE_PROVIDER_SITE_OTHER): Payer: Self-pay | Admitting: Orthopaedic Surgery

## 2013-06-27 ENCOUNTER — Other Ambulatory Visit (INDEPENDENT_AMBULATORY_CARE_PROVIDER_SITE_OTHER): Payer: Medicare Other

## 2013-06-27 ENCOUNTER — Ambulatory Visit (INDEPENDENT_AMBULATORY_CARE_PROVIDER_SITE_OTHER): Payer: Medicare Other | Admitting: Orthopaedic Surgery

## 2013-06-27 ENCOUNTER — Other Ambulatory Visit (INDEPENDENT_AMBULATORY_CARE_PROVIDER_SITE_OTHER): Payer: Self-pay

## 2013-06-27 VITALS — BP 113/65 | HR 68 | Temp 97.2°F | Ht 67.0 in | Wt 254.0 lb

## 2013-06-27 DIAGNOSIS — M25559 Pain in unspecified hip: Secondary | ICD-10-CM

## 2013-06-27 MED ORDER — SIMVASTATIN 10 MG OR TABS: 10.00 mg | ORAL_TABLET | Freq: Every evening | ORAL | Status: AC

## 2013-06-27 NOTE — Patient Instructions (Signed)
Joint Replacement Surgery Scheduling      You can start the scheduling process by contacting my surgery scheduler, Teri Lopez:  Phone:  [phone]  Fax: [phone]    After reviewing your medical records it appears that you will require preoperative medical clearance by your PCP.    Please arrange a visit with your medical care provider and have any records or medical clearance documentation faxed to my surgery scheduler.    Once you have been formally cleared by your medical care provider we can provide you with a date for surgery.  Our surgery scheduler will provide this date for you and will help you coordinate preoperative visits.      Preoperative Appointments  Many patients require an office visit with our anesthesia department in the preoperative care center. This visit typically occurs 1-2 weeks prior to surgery.     During this visit your past medical history and medications will be reviewed.  Laboratory testing will be performed at this visit and occasionally additional x-rays or ECG will be needed.     These visits will be scheduled and coordinated by our surgery scheduler once you have a surgery date.      Preoperative Education  You will be offered the opportunity to attend a preoperative education class and review an online educational program related to your joint replacement surgery. This will be arranged and offered by our surgery scheduler.             [facility], [facility] [place]  [address]   [place], [place] [address]  [phone]    [facility]  [address]  [place] [address]  [phone]

## 2013-06-27 NOTE — Progress Notes (Signed)
Chief Complaint: left knee pain    History of Present Illness:    Angela Calhoun is a 67 year old female who is here for evaluation of left knee pain.  Patient had a total hip replacement on the right in 2010 by Dr Newman Pies which is doing great. At that time her left knee and left hip were also shown to have arthritis and she had severe medial compartment OA. Since 2010, her left knee pain has worsened significantly to the point she is significantly limited in her daily activities including walking stairs, housework etc.     She has recently began a weight loss program and medication with an internist at [facility]      She takes Aleve, which provides some relief, but otherwise has not tried other remedies. She is interested in a total knee arthroplasty.    Past Medical History   Diagnosis Date    Dyslipidemia     HTN (hypertension)     GERD (gastroesophageal reflux disease)     Hypothyroidism     Osteoarthritis of hip      Past Surgical History   Procedure Laterality Date    Cholecystectomy, lap  2008    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      Pb remove tonsils/adenoids,<13 y/o      Pb biopsy of breast, incisional  1990's    Knee i&d  1980's     Traumatic Injury to Knee      Allergies   Allergen Reactions    Adhesive Tape Rash     Current Outpatient Prescriptions   Medication Sig    Acetaminophen (TYLENOL PO) as needed.    amoxicillin (AMOXIL) 500 MG capsule Take 4 capsules by mouth 1 hour before procedure.    docusate sodium (COLACE) 250 MG capsule Take 1 capsule by mouth 2 times daily. For use while on narcotic pain medication post-operatively.  Hold for diarrhea    enoxaparin (LOVENOX) 40 MG/0.4ML injection Inject 0.4 mLs under the skin daily.    levothyroxine (LEVOXYL) 137 MCG tablet Take 137 mcg by mouth daily.    lisinopril-hydrochlorothiazide (PRINZIDE, ZESTORETIC) 10-12.5 MG per tablet Take 1 Tab by mouth daily.    naproxen (ALEVE) 220 MG tablet Take 220 mg by  mouth every 8 hours as needed. 2 tabs twice a day    omeprazole (PRILOSEC) 20 MG capsule Take 20 mg by mouth daily.    oxyCODONE (OXYCONTIN) 10 MG CR tablet Take 1 tablet by mouth every 12 hours.    oxyCODONE (ROXICODONE) 5 MG immediate release tablet Take 1 tablet by mouth every 4 hours as needed for Severe Pain (Pain Score 7-10). 1-2 tabs by mouth every 4 hours as needed for pain.    simvastatin (ZOCOR) 10 MG tablet Take 10 mg by mouth every evening.    simvastatin (ZOCOR) 20 MG tablet Take 20 mg by mouth every evening.     No current facility-administered medications for this visit.     History     Social History    Marital Status: Married     Spouse Name: N/A     Number of Children: 0    Years of Education: N/A     Occupational History    Retired      Social History Main Topics    Smoking status: Former Smoker    Smokeless tobacco: Not on file    Alcohol Use: No    Drug Use: No  Sexually Active: Yes -- Female partner(s)     Other Topics Concern    Not on file     Social History Narrative    Regular diet        Minimal Exercise     Family History   Problem Relation Age of Onset    Heart Disease Father      CAD    Heart Disease Mother     Heart Disease Sister      CAD    Heart Disease Brother      CAD    Diabetes Father      Type 2       Review of Systems:   General:  Negative for fevers, chills or night sweats.  Skin: Negative for rashes, sores or infection.  Eyes: Negative for visual changes, diplopia or blurry vision.  Ears/Nose/Throat/Mouth: Negative for dental infection or problems.  Negative for sore throat or congestion.   Respiratory: Negative for cough, sputum production, wheezing, sleep apnea.  Cardiovascular: Negative for chest pain, palpitations, syncope, orthopnea or pnd.  Gastrointestinal: Negative for nausea, vomiting, diarrhea, melena or hematochezia.    Genitourinary: Negative for dysuria, frequency, hesitancy or nocturia.  Musculoskeletal: Significant for joint  pain.  Neurologic: Negative for history of seizures, numbness, tingling or weakness.  Psychiatric: negative  Hematologic/Lymphatic/Immunologic: Negative for anemia or bleeding problems.  Negative for DVT or PE history.  Endocrine: negative    Physical Exam:  BP 113/65   Pulse 68   Temp(Src) 97.2 F (36.2 C) (Oral)   Ht 5\' 7"  (1.702 m)   Wt 115.214 kg (254 lb)   BMI 39.77 kg/m2  General: Pleasant and cooperative. Alert and Oriented. No distress.    Evaluation of the knee shows varus deformity and antalgic gait  There is mild effusion and no erythema  Palpation:    MJL: Pain   LJL: pain   Gerdy's: no pain   Pes bursa: pain   Tibial Tuberosity: no pain   Patellar Facets: no pain   Patellar Tendon: no pain     Range of motion:                                             Right Knee:   Left Knee:  Extension (Normal 0)         5     110  Flexion (Normal 140)         15                                     100             Provocative Testing:  Left knee stable to ligament exam.  Pain with McMurray's  Negative patellar Grind    Vascular: Distal pulses 2+ and regular.  Sensation: SILT intact bilateral LE      Imaging:  Imaging obtained today demonstrates progression of left knee OA with increased joint space narrowing, osteophytes, and sclerotic changes of all 3 compartments.     Left Hip radiograph demonstrates slight progression of arthritis compared to prior films in 2011.     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 Patient also attempting weight loss managed through a [facility] program and hopes to lose weight over the next 4 months prior to surgery.    Plan:  We have discussed clinical and radiographic findings with the patient today. We have discussed conservative treatment which would include activity modifications, weight reduction, NSAIDs and rest. We  have also discussed injections as a treatment option but they are unlikely to provide any lasting relief. Surgical options, including total knee replacement have also been presented to the patient.     The patient is a great candidate for total knee replacement on the left. We have discussed the risks and benefits of the operation today. Patient was counseled on expected recovery and perioperative protocols and will contact Teri to schedule.  All of the patient's questions were answered.     The patient was seen and evaluated by Dr. Newman Pies and the plan was formulated by Dr. Newman Pies and presented to the patient by him.

## 2013-06-28 NOTE — Progress Notes (Signed)
ORTHO STAFF ADDENDUM:  See the student's note for details.  I saw and evaluated the patient.  I have discussed the patient with the student and agree with his findings and the plan we developed as written.    [name] returns - R TKR doing great.  Left knee pain from OA.    L knee ROM 15-100.    Xrays show severe OA, L knee.    Plann - LEFT TKR.  We discussed risks/benefits/rehab/expected outcomes of surgery today.  I answered all of her questions.  We generated a worksheet today and gave her Teri's card for scheduling purposes.    Med clearance.    Chief Complaint: left knee pain   History of Present Illness:   Angela Calhoun is a 67 year old female who is here for evaluation of left knee pain. Patient had a total hip replacement on the right in 2010 by Dr Newman Pies which is doing great. At that time her left knee and left hip were also shown to have arthritis and she had severe medial compartment OA. Since 2010, her left knee pain has worsened significantly to the point she is significantly limited in her daily activities including walking stairs, housework etc.   She has recently began a weight loss program and medication with an internist at [facility]   She takes Aleve, which provides some relief, but otherwise has not tried other remedies. She is interested in a total knee arthroplasty.   Past Medical History    Diagnosis  Date    .  Dyslipidemia     .  HTN (hypertension)     .  GERD (gastroesophageal reflux disease)     .  Hypothyroidism     .  Osteoarthritis of hip       Past Surgical History    Procedure  Laterality  Date    .  Cholecystectomy, lap   2008    .  Pb thyroidectomy total/complete   1995      Goiter. Noncancerous.    .  Pb remove tonsils/adenoids,<13 y/o      .  Pb biopsy of breast, incisional   1990's    .  Knee i&d   1980's      Traumatic Injury to Knee      Allergies    Allergen  Reactions    .  Adhesive Tape  Rash      Current Outpatient Prescriptions    Medication  Sig    .  Acetaminophen  (TYLENOL PO)  as needed.    Marland Kitchen  amoxicillin (AMOXIL) 500 MG capsule  Take 4 capsules by mouth 1 hour before procedure.    .  docusate sodium (COLACE) 250 MG capsule  Take 1 capsule by mouth 2 times daily. For use while on narcotic pain medication post-operatively. Hold for diarrhea    .  enoxaparin (LOVENOX) 40 MG/0.4ML injection  Inject 0.4 mLs under the skin daily.    Marland Kitchen  levothyroxine (LEVOXYL) 137 MCG tablet  Take 137 mcg by mouth daily.    Marland Kitchen  lisinopril-hydrochlorothiazide (PRINZIDE, ZESTORETIC) 10-12.5 MG per tablet  Take 1 Tab by mouth daily.    .  naproxen (ALEVE) 220 MG tablet  Take 220 mg by mouth every 8 hours as needed. 2 tabs twice a day    .  omeprazole (PRILOSEC) 20 MG capsule  Take 20 mg by mouth daily.    Marland Kitchen  oxyCODONE (OXYCONTIN) 10 MG CR tablet  Take 1  tablet by mouth every 12 hours.    Marland Kitchen  oxyCODONE (ROXICODONE) 5 MG immediate release tablet  Take 1 tablet by mouth every 4 hours as needed for Severe Pain (Pain Score 7-10). 1-2 tabs by mouth every 4 hours as needed for pain.    .  simvastatin (ZOCOR) 10 MG tablet  Take 10 mg by mouth every evening.    .  simvastatin (ZOCOR) 20 MG tablet  Take 20 mg by mouth every evening.      No current facility-administered medications for this visit.      History      Social History    .  Marital Status:  Married      Spouse Name:  N/A      Number of Children:  0    .  Years of Education:  N/A      Occupational History    .  Retired       Social History Main Topics    .  Smoking status:  Former Games developer    .  Smokeless tobacco:  Not on file    .  Alcohol Use:  No    .  Drug Use:  No    .  Sexually Active:  Yes -- Female partner(s)      Other Topics  Concern    .  Not on file      Social History Narrative     Regular diet         Minimal Exercise      Family History    Problem  Relation  Age of Onset    .  Heart Disease  Father       CAD    .  Heart Disease  Mother     .  Heart Disease  Sister       CAD    .  Heart Disease  Brother       CAD    .  Diabetes  Father        Type 2      Review of Systems:   General: Negative for fevers, chills or night sweats.   Skin: Negative for rashes, sores or infection.   Eyes: Negative for visual changes, diplopia or blurry vision.   Ears/Nose/Throat/Mouth: Negative for dental infection or problems. Negative for sore throat or congestion.   Respiratory: Negative for cough, sputum production, wheezing, sleep apnea.   Cardiovascular: Negative for chest pain, palpitations, syncope, orthopnea or pnd.   Gastrointestinal: Negative for nausea, vomiting, diarrhea, melena or hematochezia.   Genitourinary: Negative for dysuria, frequency, hesitancy or nocturia.   Musculoskeletal: Significant for joint pain.   Neurologic: Negative for history of seizures, numbness, tingling or weakness.   Psychiatric: negative   Hematologic/Lymphatic/Immunologic: Negative for anemia or bleeding problems. Negative for DVT or PE history.   Endocrine: negative   Physical Exam:   BP 113/65  Pulse 68  Temp(Src) 97.2 F (36.2 C) (Oral)  Ht 5\' 7"  (1.702 m)  Wt 115.214 kg (254 lb)  BMI 39.77 kg/m2   General: Pleasant and cooperative. Alert and Oriented. No distress.   Evaluation of the knee shows varus deformity and antalgic gait   There is mild effusion and no erythema   Palpation:   MJL: Pain   LJL: pain   Gerdy's: no pain   Pes bursa: pain   Tibial Tuberosity: no pain   Patellar Facets: no pain   Patellar Tendon: no pain   Range of  motion:   Right Knee: Left Knee:   Extension (Normal 0) 5 110   Flexion (Normal 140) 15 100   Provocative Testing:   Left knee stable to ligament exam.   Pain with McMurray's   Negative patellar Grind   Vascular: Distal pulses 2+ and regular.   Sensation: SILT intact bilateral LE   Imaging:   Imaging obtained today demonstrates progression of left knee OA with increased joint space narrowing, osteophytes, and sclerotic changes of all 3 compartments.   Left Hip radiograph demonstrates slight progression of arthritis compared to prior films in  2011.   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 Patient also attempting weight loss managed through a [facility] program and hopes to lose weight over the next 4 months prior to surgery.   Plan:   We have discussed clinical and radiographic findings with the patient today. We have discussed conservative treatment which would include activity modifications, weight reduction, NSAIDs and rest. We have also discussed injections as a treatment option but they are unlikely to provide any lasting relief. Surgical options, including total knee replacement have also been presented to the patient.   The patient is a great candidate for total knee replacement on the left. We have discussed the risks and benefits of the operation today. Patient was counseled on expected recovery and perioperative protocols and will contact Teri to schedule. All of the patient's questions were answered.

## 2013-08-21 HISTORY — PX: PB LAPAROSCOPY RADICAL NEPHRECTOMY: 50545

## 2013-10-17 ENCOUNTER — Telehealth (INDEPENDENT_AMBULATORY_CARE_PROVIDER_SITE_OTHER): Payer: Self-pay | Admitting: Orthopaedic Surgery

## 2013-10-21 ENCOUNTER — Other Ambulatory Visit: Payer: Self-pay

## 2013-10-22 ENCOUNTER — Encounter (INDEPENDENT_AMBULATORY_CARE_PROVIDER_SITE_OTHER): Payer: Self-pay | Admitting: Orthopaedic Surgery

## 2013-11-10 LAB — EMMI , TOTAL KNEE REPLACEMENT: EMMI Video Order Number: 13749110360

## 2013-11-22 ENCOUNTER — Telehealth (INDEPENDENT_AMBULATORY_CARE_PROVIDER_SITE_OTHER): Payer: Self-pay | Admitting: Orthopaedic Surgery

## 2013-11-22 NOTE — Telephone Encounter (Signed)
 Rcv'd mc fm ZOX:[id number], Uma please review. Procedure:L TKA

## 2013-12-05 HISTORY — PX: OTHER PROCEDURE: U1053

## 2013-12-08 LAB — EMMI , ANESTHESIA (ADULT): EMMI Video Order Number: 17407152333

## 2013-12-12 ENCOUNTER — Telehealth (INDEPENDENT_AMBULATORY_CARE_PROVIDER_SITE_OTHER): Payer: Self-pay | Admitting: Orthopaedic Surgery

## 2013-12-12 NOTE — Telephone Encounter (Signed)
S/w pt - DOS: 2/2 Pre/post op appts scheduled.

## 2013-12-12 NOTE — Telephone Encounter (Signed)
Patient is calling to schedule an appointment for her surgery she states she called surgery scheduler left VM and emailed her. Patient just wants to schedule her surgery. Last time she heard from Faythe Dingwall was an email on 11/22/13. Please advice patient.    Ok per patent to leave detailed msg on VM.

## 2013-12-24 ENCOUNTER — Ambulatory Visit (INDEPENDENT_AMBULATORY_CARE_PROVIDER_SITE_OTHER): Payer: Medicare Other | Admitting: Physician Assistant

## 2013-12-24 ENCOUNTER — Other Ambulatory Visit (INDEPENDENT_AMBULATORY_CARE_PROVIDER_SITE_OTHER): Payer: Medicare Other | Attending: Physician Assistant

## 2013-12-24 ENCOUNTER — Ambulatory Visit (INDEPENDENT_AMBULATORY_CARE_PROVIDER_SITE_OTHER): Payer: Medicare Other

## 2013-12-24 ENCOUNTER — Encounter (INDEPENDENT_AMBULATORY_CARE_PROVIDER_SITE_OTHER): Payer: Self-pay | Admitting: Physician Assistant

## 2013-12-24 ENCOUNTER — Encounter (INDEPENDENT_AMBULATORY_CARE_PROVIDER_SITE_OTHER): Payer: Self-pay

## 2013-12-24 VITALS — BP 113/63 | HR 72 | Temp 97.5°F | Resp 16 | Ht 67.0 in | Wt 244.9 lb

## 2013-12-24 DIAGNOSIS — M1710 Unilateral primary osteoarthritis, unspecified knee: Secondary | ICD-10-CM

## 2013-12-24 DIAGNOSIS — I10 Essential (primary) hypertension: Secondary | ICD-10-CM

## 2013-12-24 DIAGNOSIS — M25569 Pain in unspecified knee: Principal | ICD-10-CM | POA: Insufficient documentation

## 2013-12-24 DIAGNOSIS — C649 Malignant neoplasm of unspecified kidney, except renal pelvis: Secondary | ICD-10-CM

## 2013-12-24 DIAGNOSIS — Z01818 Encounter for other preprocedural examination: Secondary | ICD-10-CM | POA: Insufficient documentation

## 2013-12-24 DIAGNOSIS — K219 Gastro-esophageal reflux disease without esophagitis: Secondary | ICD-10-CM

## 2013-12-24 DIAGNOSIS — E785 Hyperlipidemia, unspecified: Secondary | ICD-10-CM

## 2013-12-24 LAB — CBC WITH DIFF, BLOOD
ANC-Automated: 4.6 10*3/uL (ref 1.6–7.0)
Abs Eosinophils: 0.2 10*3/uL (ref 0.1–0.5)
Abs Lymphs: 2.5 10*3/uL (ref 0.8–3.1)
Abs Monos: 0.5 10*3/uL (ref 0.2–0.8)
EOSINOPHILS: 2 % (ref 1–4)
Hct: 41.7 % (ref 34.0–45.0)
Hgb: 13.3 g/dL (ref 11.2–15.7)
Lymphocytes: 32 % (ref 19–53)
MCH: 26.8 pg (ref 26.0–32.0)
MCHC: 31.9 % — ABNORMAL LOW (ref 32.0–36.0)
MCV: 83.9 um3 (ref 79.0–95.0)
MONOCYTES: 7 % (ref 5–12)
MPV: 10.1 fL (ref 9.4–12.4)
Plt Count: 226 10*3/uL (ref 140–370)
RBC: 4.97 10*6/uL (ref 3.90–5.20)
RDW: 15.6 % — ABNORMAL HIGH (ref 12.0–14.0)
Segs: 59 % (ref 34–71)
WBC: 7.8 10*3/uL (ref 4.0–10.0)

## 2013-12-24 LAB — URINALYSIS
Bilirubin: NEGATIVE
Glucose: NEGATIVE
Ketones: NEGATIVE
Leuk Esterase: NEGATIVE
Nitrite: NEGATIVE
Protein: NEGATIVE
Specific Gravity: 1.014 (ref 1.002–1.030)
pH: 6 (ref 5.0–8.0)

## 2013-12-24 LAB — BASIC METABOLIC PANEL, BLOOD
BUN: 22 mg/dL — ABNORMAL HIGH (ref 6–20)
Bicarbonate: 26 mmol/L (ref 22–29)
CHLORIDE: 98 mmol/L (ref 98–107)
Calcium: 10.5 mg/dL (ref 8.5–10.6)
Creatinine: 1.05 mg/dL — ABNORMAL HIGH (ref 0.51–0.95)
GFR: 52 mL/min
Glucose: 84 mg/dL (ref 70–115)
Potassium: 4.3 mmol/L (ref 3.5–5.1)
Sodium: 138 mmol/L (ref 136–145)

## 2013-12-24 LAB — PROTHROMBIN TIME, BLOOD
INR: 1
PT,Patient: 10.4 s (ref 9.7–12.5)

## 2013-12-24 LAB — TYPE & SCREEN
ABO/RH: A POS
ANTIBODY SCREEN: NEGATIVE

## 2013-12-24 LAB — SED RATE, BLOOD: Sed Rate: 11 mm/h (ref 0–30)

## 2013-12-24 LAB — ABO/RH CONFIRMATION: ABO/RH: A POS

## 2013-12-24 NOTE — Patient Instructions (Addendum)
Patient preoperative instructions:  1. Nothing to eat or drink after midnight the night prior to surgery.  However, please only take the following medications the morning of surgery with sips of water: Omeprazole and Levothyroxine.  Please do NOT take Lisinopril-HCT on the day of surgery.  2. Please hold any aspirin, ibuprofen, aleve, naprosyn, Mobic, and other nonsteroidal anti-inflammatory drugs, Celebrex, fish oil, Co-Q 10, Glucosamine/Chondroitin/MSM, vitamin E, other vitamins and herbal supplements for 7 days prior to surgery.  3. Prior to surgery, DO NOT HOLD YOUR NORMAL PRESCRIPTION MEDICATIONS unless advised otherwise.   4. You may take acetaminophen, (Tylenol) or other prescription pain meds not containing aspirin/ibuprofen prior to surgery if needed.  5. Please go to the [facility] lab located on the 3rd floor today: Room 327. (Note: the [facility] lab closes at 4:30 pm).

## 2013-12-24 NOTE — Progress Notes (Signed)
Follow-Up Visit      Attending MD:   Diona Foley    Chief Complaint   Patient presents with    Knee Pain, Left      History of Present Illness:     Angela Calhoun is a 68 year old female who is here for recheck of her left knee.  She has had years of progressive knee pain and disability.  She has global knee pain.  She is limited in her gait tolerance.  She is ready for knee replacement.    Previous hip replacement doing well.     Past Medical History   Diagnosis Date    Dyslipidemia     HTN (hypertension)     GERD (gastroesophageal reflux disease)     Hypothyroidism     Osteoarthritis of hip     Arthritis of knee     Kidney carcinoma 2014     Past Surgical History   Procedure Laterality Date    Cholecystectomy, lap  2008    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      Pb remove tonsils/adenoids,<13 y/o      Pb biopsy of breast, incisional  1990's    Knee i&d  1980's     Traumatic Injury to Knee     Pb laparoscopy radical nephrectomy       Cancer     Allergies   Allergen Reactions    Dermabond [Other] Rash    Adhesive Tape Rash     Current Outpatient Prescriptions   Medication Sig    Acetaminophen (TYLENOL PO) as needed.    amoxicillin (AMOXIL) 500 MG capsule Take 4 capsules by mouth 1 hour before procedure.    levothyroxine (LEVOXYL) 137 MCG tablet Take 137 mcg by mouth daily.    lisinopril-hydrochlorothiazide (PRINZIDE, ZESTORETIC) 10-12.5 MG per tablet Take 1 Tab by mouth daily.    omeprazole (PRILOSEC) 20 MG capsule Take 20 mg by mouth daily.    simvastatin (ZOCOR) 10 MG tablet Take 10 mg by mouth every evening.     No current facility-administered medications for this visit.         History     Social History    Marital Status: Married     Spouse Name: N/A     Number of Children: 0    Years of Education: N/A     Occupational History    Retired      Social History Main Topics    Smoking status: Former Smoker    Smokeless tobacco: Not on file    Alcohol Use: No     Drug Use: No    Sexual Activity: Yes     Partners: Male     Other Topics Concern    Not on file     Social History Narrative    Regular diet        Minimal Exercise     Family History   Problem Relation Age of Onset    Heart Disease Father      CAD    Heart Disease Mother     Heart Disease Sister      CAD    Heart Disease Brother      CAD    Diabetes Father      Type 2       Review of Systems:    General:  Negative for fevers, chills or night sweats.  Skin: Negative for rashes, sores or infection.  Ears/Nose/Throat/Mouth: Negative for dental infection or problems.  Negative for sore throat or congestion.   Cardiovascular: Negative for chest pain, palpitations, syncope, orthopnea or pnd.  Musculoskeletal: Significant for joint pain.  Neurologic: Negative for history of seizures, numbness, tingling or weakness.  Hematologic/Lymphatic/Immunologic: Negative for anemia or bleeding problems.  Neg for DVT or PE history.  No history of MRSA.    Physical Exam:  There were no vitals taken for this visit.  General Appearance: healthy, alert, no distress, pleasant affect, cooperative.  Extremities:  no cyanosis, clubbing. No edema.  Vascular:  Dorsal Ped: R: 2    L:2  Post Tib: R: 2     L:2  Skin:  Skin color, texture, turgor normal. No rashes or lesions.  Mental Status: Appearance/Cooperation: in no apparent distress and oriented times 3  Musculoskeletal: Left knee varus. TTP medial.  5 to 115.  Effusion.   The patient currently has severe pain with all passive range of motion of the joint. There is notable limitation in the range of motion of the joint with active and passive range of motion.  The patient has an antalgic gait pattern due to pain and joint dysfunction.    Recent radiographs of the left knee have been reviewed and demonstrate severe degenerative joint disease with joint space narrowing, osteophyte formation and subchondral sclerosis.        Assessment and Care Plan:  Left knee osteoarthritis  Plan:   Left total knee replacement    Orders Placed This Encounter   Procedures    Urinalysis    CBC w/Diff Lavender    Sedimentation Rate (ESR), Blood Lavender    Basic Metabolic Panel, Blood Green Plasma Separator Tube    Prothrombin Time, Blood Blue    Type & Screen Lavender       We did discuss postoperative instructions including physical therapy and pain control issues.  We have answered all questions regarding surgery and recovery.    We discussed risks including but not limited to bleeding, infection, dvt, pe, stiffness, dislocation, leg length inequality, nerve or vessel injury, fracture, incomplete resolution of symptoms, need for repeat surgery, MI, CVA and risks of anesthesia.      Patient has signed consent today.      NPO post midnight.  Ancef 1 gram IV preop.    Stop Nsaids and asa.      Discharge plan: Home with HHPT.    Will avoid NSAIDs due to recent nephrectomy.         Note Author: [name], PA

## 2013-12-24 NOTE — Anesthesia Preprocedure Evaluation (Addendum)
ANESTHESIA PRE-OPERATIVE EVALUATION    Patient Information  Name: Angela Calhoun    MRN: [id number]    DOB: [birth year]    Age: 68 year old    Sex: female  Primary language spoken:  English    C/C: 68 y/o F with Left knee OA and pain is scheduled for: Procedure(s): ARTHROPLASTY LT TOTAL KNEE with Dr. Leatrice Jewels on DOS: 01/06/2014.      ROS/Medical History:  General Review & History of Anesthetic Complications:   Patient summary reviewed   No history of anesthetic complications, No PONV, No history of difficult intubation and no family history of anesthetic complications      Cardiovascular:    Exercise tolerance: >4 METS (Exercise: Walks only ~10 mins. Limited only due to pain. Denies exertional sxs.)  (+)  hypercholesterolemia  hypertension well controlled        (-) angina, DOE    ECG reviewed   Pulmonary:  - negative ROS   (-) COPD, asthma, recent URI, sleep apnea Hematology/Oncology:     (-)  chemotherapy and radiation treatment    ROS Comments + hx L kidney cancer, stage 1 per pt report: s/p lap Left nephrectomy: 08/21/2013. No Chemo/xrt.   Neuro/Psych:   - negative ROS       (-) seizures, CVA, numbness, weakness   Infectious Disease:   Negative ROS         Endo/Other:      (+) hypothyroidism, arthritis  (-) diabetes mellitus    ROS Comments: + left knee and Left hip OA. (s/p R THR)  Endorses current pain: 9+/10     Left knee xrays: 06/27/2013:  IMPRESSION:  Slight progression of tricompartmental osteoarthrosis of the left knee, moderate to severe in the medial compartment, associated with mild genu varus deformity.  Lateral subluxation of the patella is redemonstrated.  Multiple intra-articular bodies, including a 2 cm body posteriorly.    Left hip xrays: 06/27/13:  IMPRESSION:  No acute bony abnormality. Moderate to severe left hip osteoarthrosis, unchanged from the prior study. Uncomplicated total right hip arthroplasty.     GI/Hepatic:     (+) GERD well controlled,   (-) hepatitis, liver disease, renal disease    Renal:         No chronic renal disease, Electrolyte acid base abnormality and IV Contrast in last 24-48 hours  ROS comments (hx of L kidney cancer) Pregnancy History:  Para: 0  Gravida: 0  Currently pregnant? no       [facility]) Notes:  no beta blocker therapy  [facility] Test & records reviewed by [facility] provider  [facility] Echo/ECG/Angio test (EKG: 11/19/2013: NSR, 68 BPM, nl EKG. (See OSH record in EMR)) available.  [facility] labs available.    Patient preoperative instructions:  1. Nothing to eat or drink after midnight the night prior to surgery.  However, advised to only take the following medications the morning of surgery with sips of water: Levothyroxine and Omeprazole.  Advised to NOT take Lisinopril-HCT on the day of surgery.  2. Advised to hold any aspirin, ibuprofen, aleve, naprosyn, Mobic, and other nonsteroidal anti-inflammatory drugs, Celebrex, fish oil, Co-Q 10, Glucosamine/Chondroitin/MSM, vitamin E, other vitamins and herbal supplements for 7 days prior to surgery.  3. Prior to surgery, advised to NOT [place] unless advised otherwise.   4. Advised may take acetaminophen, (Tylenol) or other prescription pain meds not containing aspirin/ibuprofen prior to surgery if needed.  5. Labs: chem, cbc, ESR,  PT, T&S and UA:  :      Patient preoperative instructions:  1. Nothing to eat or drink after midnight the night prior to surgery.  However, advised to only take the following medications the morning of surgery with sips of water: Levothyroxine and Omeprazole.  Advised to NOT take Lisinopril-HCT on the day of surgery.  2. Advised to hold any aspirin, ibuprofen, aleve, naprosyn, Mobic, and other nonsteroidal anti-inflammatory drugs, Celebrex, fish oil, Co-Q 10, Glucosamine/Chondroitin/MSM, vitamin E, other vitamins and herbal supplements for 7 days prior to surgery.  3. Prior to surgery, advised to NOT [place] unless advised otherwise.   4. Advised  may take acetaminophen, (Tylenol) or other prescription pain meds not containing aspirin/ibuprofen prior to surgery if needed.  5. Labs: chem, cbc, ESR, PT, T&S and UA:    Vital signs:  BP 113/63   Pulse 72   Temp(Src) 97.5 F (36.4 C) (Oral)   Resp 16   Ht $R'5\' 7"'io$  (1.702 m)   Wt 111.1 kg (244 lb 14.9 oz)   BMI 38.35 kg/m2   SpO2 97%   General: NAD, alert and oriented to TPP and situation, PERRL, speech clear. Here with her husband.  Psych: Normal affect; no evidence of physical, emotional, or financial abuse.      Physical Exam    Airway:  Inter-inciser distance 3-4 cm  Prognanth Able    Mallampati: III  Neck ROM: full  TM distance: 5-6 cm  Short thick neck: No    Cardiovascular:  - cardiovascular exam normal   Rhythm: regular   Rate: normal     Negative for murmur     Pulmonary:  - pulmonary exam normal      breath sounds clear to auscultation        Neuro/Neck/Skeletal/Skin:    Comment: Left knee:  Endorses current pain: 9+/10 and has decreased ROM Left knee due to pain. Walks with antalgic gait. (Full exam deferred to ortho)      Positive for Skeletal abnormalities      Dental:  - normal exam    Abdominal:    Comment: + obese abd  General: obesity   Abdomen: soft.   Bowel sounds: normal.    Additional Clinical Notes:         Has a physician diagnosed you with sleep agnea?: No  Do you use a CPAP at home?: No  OSA total score (A score of 2 or more is high risk. Offer patient sleep study.): 1    Past Medical History   Diagnosis Date    Dyslipidemia     HTN (hypertension)     GERD (gastroesophageal reflux disease)     Hypothyroidism     Osteoarthritis of hip      Left and s/p R THR    Arthritis of knee      Left > Right    Left Kidney carcinoma 08/21/2013 sx     s/p Left nephrectomy 08/21/2013: no CHEMO/XRT     Past Surgical History   Procedure Laterality Date    Cholecystectomy, lap  2008    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      Pb remove tonsils/adenoids,<13 y/o      Pb  biopsy of breast, incisional  1990's    Knee i&d  1980's     Traumatic Injury to Knee     Pb laparoscopy radical nephrectomy Left 08/21/2013     Cancer of  Left kidney    Colonoscopy      S/p right thr  2011     GA per pt report with REG     History   Substance Use Topics    Smoking status: Former Smoker -- 2.00 packs/day for 12 years     Types: Cigarettes     Quit date: 12/24/1980    Smokeless tobacco: Never Used      Comment: quit smoking in early 1980's after 12 years of 2PPD    Alcohol Use: No       Current Outpatient Prescriptions   Medication Sig Dispense Refill    Acetaminophen (TYLENOL PO) Take 650 mg by mouth 4 times daily as needed.        amoxicillin (AMOXIL) 500 MG capsule Take 4 capsules by mouth 1 hour before procedure.  20 capsule  2    levothyroxine (LEVOXYL) 137 MCG tablet Take 137 mcg by mouth every morning.        lisinopril-hydrochlorothiazide (PRINZIDE, ZESTORETIC) 10-12.5 MG per tablet Take 1 tablet by mouth every morning.        omeprazole (PRILOSEC) 20 MG capsule Take 20 mg by mouth every morning.        simvastatin (ZOCOR) 10 MG tablet Take 10 mg by mouth every evening.         No current facility-administered medications for this visit.     Allergies   Allergen Reactions    Dermabond [Other] Rash    Adhesive Tape Rash       Labs and Other Data  Lab Results   Component Value Date    NA 133 03/09/2010    K 3.9 03/09/2010    CL 100 03/09/2010    BICARB 23 03/09/2010    BUN 15 03/09/2010    CREAT 0.64 03/09/2010    GLU 118 03/09/2010    [place] 8.4 03/09/2010     No results found for this basename: AST,  ALT,  GGT,  LDH,  ALK,  TP,  ALB,  TBILI,  DBILI     Lab Results   Component Value Date    WBC 9.6 03/09/2010    RBC 3.76 03/09/2010    HGB 10.1 03/09/2010    HCT 31.6 03/09/2010    MCV 84.0 03/09/2010    MCHC 32.0 03/09/2010    RDW 15.3 03/09/2010    PLT 159 03/09/2010    MPV 10.5 03/09/2010    SEG 62 02/23/2010    LYMPHS 30 02/23/2010    MONOS 5 02/23/2010    EOS 2 02/23/2010     Lab Results   Component Value Date    INR  1.0 02/23/2010     No results found for this basename: ARTPH,  ARTPO2,  ARTPCO2       Anesthesia Plan:  ASA 2 (Mild systemic disease)     Planned induction method: general, regional and spinal   Planned monitoring method: Routine monitoring  Comments: (Discussed spinal vs general with +/- REG anesthesia with pt today.  Anesthesia induction method(s) to be discussed with patient and confirmed by attending anesthesiologist on the day of surgery.)

## 2013-12-24 NOTE — H&P (Signed)
PREOPERATIVE HISTORY AND PHYSICAL AND ANESTHESIA PRE-OPERATIVE EVALUATION    Patient Information  Name: Angela Calhoun    MRN: [id number]    DOB: 1946-05-09    Age: 68 year old    Sex: female  Primary language spoken:  English    C/C: 68 y/o F with Left knee OA and pain is scheduled for: Procedure(s): ARTHROPLASTY LT TOTAL KNEE with Dr. Leatrice Jewels on DOS: 01/06/2014.      HPI: as per EMR:  68 year old female with years of progressive Left knee pain and disability. She has global knee pain. She is limited in her gait tolerance. She is ready for knee replacement.    ROS/Medical History:  General Review & History of Anesthetic Complications:   Patient summary reviewed   No history of anesthetic complications, No PONV, No history of difficult intubation and no family history of anesthetic complications      Cardiovascular:  Exercise tolerance: >4 METS (Exercise: Walks only ~10 mins. Limited only due to pain. Denies exertional sxs.)  (+)  hypercholesterolemia  hypertension well controlled    (-) angina, DOE  ECG reviewed   Pulmonary:  - negative ROS   (-) COPD, asthma, recent URI, sleep apnea Hematology/Oncology:   (-)  chemotherapy and radiation treatment    ROS Comments + hx L kidney cancer, stage 1 per pt report: s/p lap Left nephrectomy: 08/21/2013. No Chemo/xrt.   Neuro/Psych:   - negative ROS   (-) seizures, CVA, numbness, weakness Infectious Disease:   Negative ROS       Endo/Other:    (+) hypothyroidism, arthritis  (-) diabetes mellitus  ROS Comments:  + left knee and Left hip OA. (s/p R THR)  Endorses current pain: 9+/10     Left knee xrays: 06/27/2013:  IMPRESSION:  Slight progression of tricompartmental osteoarthrosis of the left knee, moderate to severe in the medial compartment, associated with mild genu varus deformity.  Lateral subluxation of the patella is redemonstrated.  Multiple intra-articular bodies, including a 2 cm body posteriorly.    Left hip xrays: 06/27/13:  IMPRESSION:  No acute bony abnormality.  Moderate to severe left hip osteoarthrosis, unchanged from the prior study. Uncomplicated total right hip arthroplasty.   GI/Hepatic:   (+) GERD well controlled,   (-) hepatitis, liver disease, renal disease   Renal:     No chronic renal disease, Electrolyte acid base abnormality and IV Contrast in last 24-48 hours  ROS comments (hx of L kidney cancer) Pregnancy History:  Para: 0  Gravida: 0  Currently pregnant? no       [facility]) Notes:  no beta blocker therapy  [facility] Test & records reviewed by [facility] provider  [facility] Echo/ECG/Angio test (EKG: 11/19/2013: NSR, 68 BPM, nl EKG. (See OSH record in EMR)) available.    [facility] labs available.      Vital signs:  BP 113/63   Pulse 72   Temp(Src) 97.5 F (36.4 C) (Oral)   Resp 16   Ht 5' 7"  (1.702 Angela Calhoun)   Wt 111.1 kg (244 lb 14.9 oz)   BMI 38.35 kg/m2   SpO2 97%   General: NAD, alert and oriented to TPP and situation, PERRL, speech clear. Here with her husband.  Psych: Normal affect; no evidence of physical, emotional, or financial abuse.    Physical Exam  Airway:  Inter-inciser distance 3-4 cm  Prognanth Able    Mallampati: III  Neck ROM: full  TM distance: 5-6 cm  Short thick neck: No    Cardiovascular:  - cardiovascular exam normal   Rhythm: regular   Rate: normal   Negative for murmur     Pulmonary:  - pulmonary exam normal   breath sounds clear to auscultation    Neuro/Neck/Skeletal/Skin:  Comment: Left knee:  Endorses current pain: 9+/10 and has decreased ROM Left knee due to pain. Walks with antalgic gait. (Full exam deferred to ortho)  Positive for Skeletal abnormalities    Dental:  - normal exam    Abdominal:  Comment: + obese abd  General: obesity   Abdomen: soft.   Bowel sounds: normal.    Additional Clinical Notes:     Has a physician diagnosed you with sleep agnea?: No  Do you use a CPAP at home?: No  OSA total score (A score of 2 or more is high risk. Offer patient sleep study.): 1    Past Medical History   Diagnosis Date    Dyslipidemia     HTN  (hypertension)     GERD (gastroesophageal reflux disease)     Hypothyroidism     Osteoarthritis of hip      Left and s/p R THR    Arthritis of knee      Left > Right    Left Kidney carcinoma 08/21/2013 sx     s/p Left nephrectomy 08/21/2013: no CHEMO/XRT     Past Surgical History   Procedure Laterality Date    Cholecystectomy, lap  2008    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      Pb remove tonsils/adenoids,<13 y/o      Pb biopsy of breast, incisional  1990's    Knee i&d  1980's     Traumatic Injury to Knee     Pb laparoscopy radical nephrectomy Left 08/21/2013     Cancer of Left kidney    Colonoscopy      S/p right thr  2011     GA per pt report with REG     History   Substance Use Topics    Smoking status: Former Smoker -- 2.00 packs/day for 12 years     Types: Cigarettes     Quit date: 12/24/1980    Smokeless tobacco: Never Used      Comment: quit smoking in early 1980's after 12 years of 2PPD    Alcohol Use: No       Current Outpatient Prescriptions   Medication Sig Dispense Refill    Acetaminophen (TYLENOL PO) Take 650 mg by mouth 4 times daily as needed.        amoxicillin (AMOXIL) 500 MG capsule Take 4 capsules by mouth 1 hour before procedure.  20 capsule  2    levothyroxine (LEVOXYL) 137 MCG tablet Take 137 mcg by mouth every morning.        lisinopril-hydrochlorothiazide (PRINZIDE, ZESTORETIC) 10-12.5 MG per tablet Take 1 tablet by mouth every morning.        omeprazole (PRILOSEC) 20 MG capsule Take 20 mg by mouth every morning.        simvastatin (ZOCOR) 10 MG tablet Take 10 mg by mouth every evening.         No current facility-administered medications for this visit.     Allergies   Allergen Reactions    Dermabond [Other] Rash    Adhesive Tape Rash     Labs and Other Data  Lab Results   Component Value Date  NA 133 03/09/2010    K 3.9 03/09/2010    CL 100 03/09/2010    BICARB 23 03/09/2010    BUN 15 03/09/2010    CREAT 0.64 03/09/2010    GLU 118 03/09/2010    [place] 8.4  03/09/2010     No results found for this basename: AST,  ALT,  GGT,  LDH,  ALK,  TP,  ALB,  TBILI,  DBILI     Lab Results   Component Value Date    WBC 9.6 03/09/2010    RBC 3.76 03/09/2010    HGB 10.1 03/09/2010    HCT 31.6 03/09/2010    MCV 84.0 03/09/2010    MCHC 32.0 03/09/2010    RDW 15.3 03/09/2010    PLT 159 03/09/2010    MPV 10.5 03/09/2010    SEG 62 02/23/2010    LYMPHS 30 02/23/2010    MONOS 5 02/23/2010    EOS 2 02/23/2010     Lab Results   Component Value Date    INR 1.0 02/23/2010     No results found for this basename: ARTPH,  ARTPO2,  ARTPCO2     Anesthesia Plan:  ASA 2 (Mild systemic disease)   Planned induction method: general, regional and spinal   Planned monitoring method: Routine monitoring  Comments: (Discussed spinal vs general with +/- REG anesthesia with pt today.  Anesthesia induction method(s) to be discussed with patient and confirmed by attending anesthesiologist on the day of surgery.)     Patient preoperative instructions:  1. Nothing to eat or drink after midnight the night prior to surgery.  However, advised to only take the following medications the morning of surgery with sips of water: Levothyroxine and Omeprazole.  Advised to NOT take Lisinopril-HCT on the day of surgery.  2. Advised to hold any aspirin, ibuprofen, aleve, naprosyn, Mobic, and other nonsteroidal anti-inflammatory drugs, Celebrex, fish oil, Co-Q 10, Glucosamine/Chondroitin/MSM, vitamin E, other vitamins and herbal supplements for 7 days prior to surgery.  3. Prior to surgery, advised to NOT [place] unless advised otherwise.   4. Advised may take acetaminophen, (Tylenol) or other prescription pain meds not containing aspirin/ibuprofen prior to surgery if needed.  5. Labs: chem, cbc, ESR, PT, T&S and UA

## 2013-12-25 LAB — MRSA SURVEILLANCE CULTURE

## 2013-12-25 NOTE — Progress Notes (Signed)
12/25/2013: f/u preop labs from 12/24/13:    Labs are essentially WNL except as follows:    BUN/CREAT: sl elevated 22/1.05 with sl lower GFR: 52    (Last BUN/CREAT in EMR: 03/09/2010: was 15/0.64 WNL)      C. Pearson-Bennett, NP   [facility] preop anesthesia

## 2014-01-02 LAB — EMMI , ANESTHESIA (ADULT): EMMI Video Order Number: 15891471664

## 2014-01-06 ENCOUNTER — Inpatient Hospital Stay
Admission: RE | Admit: 2014-01-06 | Discharge: 2014-01-08 | DRG: 470 | Disposition: A | Discharge status: Home Health | Payer: Medicare Other | Source: Ambulatory Visit | Attending: Orthopaedic Surgery | Admitting: Orthopaedic Surgery

## 2014-01-06 ENCOUNTER — Inpatient Hospital Stay (HOSPITAL_COMMUNITY): Payer: Medicare Other | Admitting: Anesthesiology

## 2014-01-06 ENCOUNTER — Encounter (HOSPITAL_COMMUNITY): Admission: RE | Disposition: A | Discharge status: Home Health | Payer: Self-pay | Attending: Orthopaedic Surgery

## 2014-01-06 ENCOUNTER — Inpatient Hospital Stay (HOSPITAL_COMMUNITY): Payer: Medicare Other | Admitting: Orthopaedic Surgery

## 2014-01-06 ENCOUNTER — Encounter (HOSPITAL_COMMUNITY): Payer: Medicare Other | Admitting: Anesthesiology

## 2014-01-06 ENCOUNTER — Encounter (HOSPITAL_COMMUNITY): Payer: Medicare Other

## 2014-01-06 ENCOUNTER — Encounter (HOSPITAL_COMMUNITY): Payer: Self-pay

## 2014-01-06 DIAGNOSIS — Z96659 Presence of unspecified artificial knee joint: Secondary | ICD-10-CM

## 2014-01-06 DIAGNOSIS — Z01818 Encounter for other preprocedural examination: Secondary | ICD-10-CM

## 2014-01-06 DIAGNOSIS — R269 Unspecified abnormalities of gait and mobility: Secondary | ICD-10-CM

## 2014-01-06 DIAGNOSIS — G8918 Other acute postprocedural pain: Secondary | ICD-10-CM

## 2014-01-06 DIAGNOSIS — IMO0002 Reserved for concepts with insufficient information to code with codable children: Secondary | ICD-10-CM

## 2014-01-06 DIAGNOSIS — D62 Acute posthemorrhagic anemia: Secondary | ICD-10-CM | POA: Diagnosis not present

## 2014-01-06 DIAGNOSIS — E785 Hyperlipidemia, unspecified: Secondary | ICD-10-CM | POA: Diagnosis present

## 2014-01-06 DIAGNOSIS — C649 Malignant neoplasm of unspecified kidney, except renal pelvis: Secondary | ICD-10-CM | POA: Diagnosis present

## 2014-01-06 DIAGNOSIS — I10 Essential (primary) hypertension: Secondary | ICD-10-CM | POA: Diagnosis present

## 2014-01-06 DIAGNOSIS — E039 Hypothyroidism, unspecified: Secondary | ICD-10-CM | POA: Diagnosis present

## 2014-01-06 DIAGNOSIS — M1710 Unilateral primary osteoarthritis, unspecified knee: Principal | ICD-10-CM | POA: Diagnosis present

## 2014-01-06 DIAGNOSIS — K219 Gastro-esophageal reflux disease without esophagitis: Secondary | ICD-10-CM | POA: Diagnosis present

## 2014-01-06 DIAGNOSIS — M25569 Pain in unspecified knee: Secondary | ICD-10-CM

## 2014-01-06 DIAGNOSIS — R262 Difficulty in walking, not elsewhere classified: Secondary | ICD-10-CM

## 2014-01-06 SURGERY — ARTHROPLASTY, KNEE

## 2014-01-06 SURGERY — ARTHROPLASTY, KNEE
Anesthesia: Regional

## 2014-01-06 MED ORDER — EPHEDRINE SULFATE 50 MG/ML IJ SOLN
INTRAMUSCULAR | Status: DC | PRN
Start: 2014-01-06 — End: 2014-01-06
  Administered 2014-01-06: 10 mg via INTRAVENOUS

## 2014-01-06 MED ORDER — POLYVINYL ALCOHOL-POVIDONE 1.4-0.6 % OP SOLN
1.0000 [drp] | OPHTHALMIC | Status: DC | PRN
Start: 2014-01-06 — End: 2014-01-08

## 2014-01-06 MED ORDER — LEVOTHYROXINE SODIUM 137 MCG OR TABS
137.0000 ug | ORAL_TABLET | Freq: Every morning | ORAL | Status: DC
Start: 2014-01-07 — End: 2014-01-08
  Administered 2014-01-07 – 2014-01-08 (×2): 137 ug via ORAL
  Filled 2014-01-06 (×2): qty 1

## 2014-01-06 MED ORDER — FENTANYL CITRATE 0.05 MG/ML IJ SOLN
INTRAMUSCULAR | Status: DC | PRN
Start: 2014-01-06 — End: 2014-01-06
  Administered 2014-01-06 (×2): 50 ug via INTRAVENOUS

## 2014-01-06 MED ORDER — MIDAZOLAM HCL 2 MG/2ML IJ SOLN
INTRAMUSCULAR | Status: DC | PRN
Start: 2014-01-06 — End: 2014-01-06
  Administered 2014-01-06: 2 mg via INTRAVENOUS

## 2014-01-06 MED ORDER — ONDANSETRON HCL 4 MG/2ML IV SOLN
4.0000 mg | Freq: Once | INTRAMUSCULAR | Status: DC | PRN
Start: 2014-01-06 — End: 2014-01-06

## 2014-01-06 MED ORDER — PROPOFOL 10 MG/ML IV EMUL
INTRAVENOUS | Status: DC | PRN
Start: 2014-01-06 — End: 2014-01-06
  Administered 2014-01-06: 75 ug/kg/min via INTRAVENOUS

## 2014-01-06 MED ORDER — NALOXONE HCL 0.4 MG/ML IJ SOLN
0.1000 mg | INTRAMUSCULAR | Status: DC | PRN
Start: 2014-01-06 — End: 2014-01-08

## 2014-01-06 MED ORDER — ACETAMINOPHEN 325 MG PO TABS
650.0000 mg | ORAL_TABLET | ORAL | Status: DC | PRN
Start: 2014-01-06 — End: 2014-01-08

## 2014-01-06 MED ORDER — PSYLLIUM 100 % OR PACK
1.0000 | PACK | Freq: Every day | ORAL | Status: DC
Start: 2014-01-07 — End: 2014-01-08
  Administered 2014-01-07 – 2014-01-08 (×2): 6 g via ORAL
  Filled 2014-01-06 (×2): qty 1

## 2014-01-06 MED ORDER — ROPIVACAINE HCL 2 MG/ML IJ SOLN
INTRAMUSCULAR | Status: DC
Start: 2014-01-06 — End: 2014-01-08
  Administered 2014-01-06: 500 mL via PERINEURAL
  Filled 2014-01-06: qty 500

## 2014-01-06 MED ORDER — HYDROMORPHONE HCL 1 MG/ML IJ SOLN
0.5000 mg | INTRAMUSCULAR | Status: DC | PRN
Start: 2014-01-06 — End: 2014-01-06

## 2014-01-06 MED ORDER — METOCLOPRAMIDE HCL 5 MG/ML IJ SOLN
10.0000 mg | Freq: Four times a day (QID) | INTRAMUSCULAR | Status: DC | PRN
Start: 2014-01-06 — End: 2014-01-08

## 2014-01-06 MED ORDER — LISINOPRIL-HYDROCHLOROTHIAZIDE 10-12.5 MG OR TABS
1.0000 | ORAL_TABLET | Freq: Every morning | ORAL | Status: DC
Start: 2014-01-07 — End: 2014-01-06

## 2014-01-06 MED ORDER — FAMOTIDINE 20 MG OR TABS
20.0000 mg | ORAL_TABLET | Freq: Two times a day (BID) | ORAL | Status: DC
Start: 2014-01-06 — End: 2014-01-08
  Administered 2014-01-06 – 2014-01-08 (×4): 20 mg via ORAL
  Filled 2014-01-06 (×4): qty 1

## 2014-01-06 MED ORDER — FENTANYL CITRATE 0.05 MG/ML IJ SOLN
INTRAMUSCULAR | Status: DC | PRN
Start: 2014-01-06 — End: 2014-01-06
  Administered 2014-01-06: 5 ug via INTRATHECAL

## 2014-01-06 MED ORDER — BISACODYL 10 MG RE SUPP
10.0000 mg | Freq: Every day | RECTAL | Status: DC | PRN
Start: 2014-01-08 — End: 2014-01-08

## 2014-01-06 MED ORDER — DEXAMETHASONE 4 MG OR TABS
4.0000 mg | ORAL_TABLET | Freq: Once | ORAL | Status: AC
Start: 2014-01-08 — End: 2014-01-08
  Administered 2014-01-08 (×2): 4 mg via ORAL
  Filled 2014-01-06: qty 1

## 2014-01-06 MED ORDER — LISINOPRIL 10 MG OR TABS
10.0000 mg | ORAL_TABLET | Freq: Every day | ORAL | Status: DC
Start: 2014-01-07 — End: 2014-01-08
  Administered 2014-01-07 – 2014-01-08 (×2): 10 mg via ORAL
  Filled 2014-01-06 (×2): qty 1

## 2014-01-06 MED ORDER — MIDAZOLAM HCL 2 MG/2ML IJ SOLN
INTRAMUSCULAR | Status: DC | PRN
Start: 2014-01-06 — End: 2014-01-06
  Administered 2014-01-06 (×2): 1 mg via INTRAVENOUS

## 2014-01-06 MED ORDER — CEFAZOLIN SODIUM 1 GM IJ SOLR
INTRAMUSCULAR | Status: DC | PRN
Start: 2014-01-06 — End: 2014-01-06
  Administered 2014-01-06: 2000 mg via INTRAVENOUS

## 2014-01-06 MED ORDER — OXYCODONE HCL 10 MG OR TABS
10.0000 mg | ORAL_TABLET | ORAL | Status: DC | PRN
Start: 2014-01-06 — End: 2014-01-08
  Administered 2014-01-07 – 2014-01-08 (×9): 10 mg via ORAL
  Filled 2014-01-06 (×11): qty 1

## 2014-01-06 MED ORDER — BUPIVACAINE IN DEXTROSE 0.75-8.25 % IT SOLN
INTRATHECAL | Status: DC | PRN
Start: 2014-01-06 — End: 2014-01-06
  Administered 2014-01-06: 1.8 mL via INTRATHECAL

## 2014-01-06 MED ORDER — LACTATED RINGERS IV SOLN
INTRAVENOUS | Status: DC | PRN
Start: 2014-01-06 — End: 2014-01-06
  Administered 2014-01-06: 14:00:00 via INTRAVENOUS

## 2014-01-06 MED ORDER — FENTANYL CITRATE 0.05 MG/ML IJ SOLN
25.0000 ug | INTRAMUSCULAR | Status: DC | PRN
Start: 2014-01-06 — End: 2014-01-06

## 2014-01-06 MED ORDER — SODIUM CHLORIDE 0.9 % IV SOLN
200.0000 ug | INTRAVENOUS | Status: DC | PRN
Start: 2014-01-06 — End: 2014-01-06
  Administered 2014-01-06: .4 ug/kg/h via INTRAVENOUS

## 2014-01-06 MED ORDER — ACETAMINOPHEN 10 MG/ML IV SOLN
INTRAVENOUS | Status: DC | PRN
Start: 2014-01-06 — End: 2014-01-06
  Administered 2014-01-06: 1000 mg via INTRAVENOUS

## 2014-01-06 MED ORDER — MEPERIDINE HCL 25 MG/ML IJ SOLN
12.5000 mg | INTRAMUSCULAR | Status: DC | PRN
Start: 2014-01-06 — End: 2014-01-06

## 2014-01-06 MED ORDER — FENTANYL CITRATE 0.05 MG/ML IJ SOLN
50.0000 ug | INTRAMUSCULAR | Status: DC | PRN
Start: 2014-01-06 — End: 2014-01-06

## 2014-01-06 MED ORDER — DOCUSATE SODIUM 250 MG OR CAPS
250.0000 mg | ORAL_CAPSULE | Freq: Two times a day (BID) | ORAL | Status: DC
Start: 2014-01-06 — End: 2014-01-08
  Administered 2014-01-06 – 2014-01-08 (×4): 250 mg via ORAL
  Filled 2014-01-06 (×4): qty 1

## 2014-01-06 MED ORDER — FLEET ADULT ENEMA 7-19 GM/118ML RE ENEM
1.0000 | ENEMA | Freq: Every day | RECTAL | Status: DC | PRN
Start: 2014-01-09 — End: 2014-01-08

## 2014-01-06 MED ORDER — MULTI-VITAMINS PO TABS
1.0000 | ORAL_TABLET | Freq: Every day | ORAL | Status: DC
Start: 2014-01-07 — End: 2014-01-08
  Administered 2014-01-07 – 2014-01-08 (×2): 1 via ORAL
  Filled 2014-01-06 (×2): qty 1

## 2014-01-06 MED ORDER — SODIUM CHLORIDE 0.9 % IV SOLN
12.5000 mg | Freq: Once | INTRAVENOUS | Status: DC | PRN
Start: 2014-01-06 — End: 2014-01-06

## 2014-01-06 MED ORDER — MENTHOL 3 MG MT LOZG
1.0000 | LOZENGE | Freq: Three times a day (TID) | OROMUCOSAL | Status: DC | PRN
Start: 2014-01-06 — End: 2014-01-08

## 2014-01-06 MED ORDER — LACTATED RINGERS IV SOLN
INTRAVENOUS | Status: DC
Start: 2014-01-06 — End: 2014-01-06

## 2014-01-06 MED ORDER — RIVAROXABAN 10 MG PO TABS
10.0000 mg | ORAL_TABLET | ORAL | Status: DC
Start: 2014-01-07 — End: 2014-01-08
  Administered 2014-01-07: 10 mg via ORAL
  Filled 2014-01-06: qty 1

## 2014-01-06 MED ORDER — DIPHENHYDRAMINE HCL 25 MG OR TABS OR CAPS CUSTOM
25.0000 mg | ORAL_CAPSULE | Freq: Every evening | ORAL | Status: DC | PRN
Start: 2014-01-06 — End: 2014-01-08

## 2014-01-06 MED ORDER — LIDOCAINE HCL 2 % IJ SOLN
INTRAMUSCULAR | Status: DC | PRN
Start: 2014-01-06 — End: 2014-01-06
  Administered 2014-01-06: 35 mL via PERINEURAL

## 2014-01-06 MED ORDER — VITAMIN C 500 MG OR TABS
500.0000 mg | ORAL_TABLET | Freq: Two times a day (BID) | ORAL | Status: DC
Start: 2014-01-06 — End: 2014-01-08
  Administered 2014-01-06 – 2014-01-08 (×4): 500 mg via ORAL
  Filled 2014-01-06 (×4): qty 1

## 2014-01-06 MED ORDER — NALOXONE HCL 0.4 MG/ML IJ SOLN
0.1000 mg | INTRAMUSCULAR | Status: DC | PRN
Start: 2014-01-06 — End: 2014-01-06

## 2014-01-06 MED ORDER — OXYCODONE HCL 5 MG OR TABS
5.0000 mg | ORAL_TABLET | ORAL | Status: DC | PRN
Start: 2014-01-06 — End: 2014-01-08
  Administered 2014-01-07: 5 mg via ORAL
  Filled 2014-01-06: qty 1

## 2014-01-06 MED ORDER — DEXAMETHASONE 4 MG OR TABS
6.0000 mg | ORAL_TABLET | Freq: Once | ORAL | Status: AC
Start: 2014-01-07 — End: 2014-01-07
  Administered 2014-01-07: 6 mg via ORAL
  Filled 2014-01-06: qty 1

## 2014-01-06 MED ORDER — CALCIUM CARBONATE ANTACID 500 MG OR CHEW
500.0000 mg | CHEWABLE_TABLET | Freq: Four times a day (QID) | ORAL | Status: DC | PRN
Start: 2014-01-06 — End: 2014-01-08

## 2014-01-06 MED ORDER — HYDROMORPHONE HCL 1 MG/ML IJ SOLN
0.5000 mg | INTRAMUSCULAR | Status: DC | PRN
Start: 2014-01-06 — End: 2014-01-08
  Administered 2014-01-07: 0.5 mg via INTRAVENOUS
  Filled 2014-01-06: qty 0.5

## 2014-01-06 MED ORDER — SODIUM CHLORIDE 0.9 % IV SOLN
1000.0000 mg | Freq: Three times a day (TID) | INTRAVENOUS | Status: AC
Start: 2014-01-06 — End: 2014-01-07
  Administered 2014-01-06 – 2014-01-07 (×2): 1000 mg via INTRAVENOUS
  Filled 2014-01-06 (×2): qty 1000

## 2014-01-06 MED ORDER — DIPHENHYDRAMINE HCL 50 MG/ML IJ SOLN
12.5000 mg | Freq: Once | INTRAMUSCULAR | Status: DC | PRN
Start: 2014-01-06 — End: 2014-01-06

## 2014-01-06 MED ORDER — ONDANSETRON HCL 4 MG/2ML IV SOLN
4.0000 mg | Freq: Four times a day (QID) | INTRAMUSCULAR | Status: DC
Start: 2014-01-06 — End: 2014-01-08
  Administered 2014-01-06 – 2014-01-07 (×2): 4 mg via INTRAVENOUS
  Filled 2014-01-06 (×3): qty 2

## 2014-01-06 MED ORDER — SODIUM CHLORIDE 0.9 % IR SOLN
Status: DC | PRN
Start: 2014-01-06 — End: 2014-01-06
  Administered 2014-01-06: 15:00:00

## 2014-01-06 MED ORDER — SIMVASTATIN 10 MG OR TABS
10.0000 mg | ORAL_TABLET | Freq: Every evening | ORAL | Status: DC
Start: 2014-01-06 — End: 2014-01-08
  Administered 2014-01-06 – 2014-01-07 (×2): 10 mg via ORAL
  Filled 2014-01-06 (×2): qty 1

## 2014-01-06 MED ORDER — ROPIVACAINE HCL 5 MG/ML IJ SOLN
INTRAMUSCULAR | Status: DC | PRN
Start: 2014-01-06 — End: 2014-01-06
  Filled 2014-01-06: qty 30

## 2014-01-06 MED ORDER — HYDROCHLOROTHIAZIDE 25 MG OR TABS
12.5000 mg | ORAL_TABLET | Freq: Every day | ORAL | Status: DC
Start: 2014-01-07 — End: 2014-01-08
  Administered 2014-01-07 – 2014-01-08 (×2): 12.5 mg via ORAL
  Filled 2014-01-06 (×2): qty 1

## 2014-01-06 MED ORDER — SODIUM CHLORIDE 0.9 % IV SOLN
INTRAVENOUS | Status: DC
Start: 2014-01-06 — End: 2014-01-08
  Administered 2014-01-06: 20:00:00 via INTRAVENOUS

## 2014-01-06 MED ORDER — DEXAMETHASONE SODIUM PHOSPHATE 4 MG/ML IJ SOLN (CUSTOM)
INTRAMUSCULAR | Status: DC | PRN
Start: 2014-01-06 — End: 2014-01-06
  Administered 2014-01-06: 8 mg via INTRAVENOUS

## 2014-01-06 MED ORDER — MAGNESIUM HYDROXIDE 400 MG/5ML OR SUSP
30.0000 mL | Freq: Two times a day (BID) | ORAL | Status: DC | PRN
Start: 2014-01-07 — End: 2014-01-08

## 2014-01-06 MED ORDER — ACETAMINOPHEN 325 MG PO TABS
650.0000 mg | ORAL_TABLET | Freq: Four times a day (QID) | ORAL | Status: DC
Start: 2014-01-06 — End: 2014-01-08
  Administered 2014-01-06 – 2014-01-08 (×7): 650 mg via ORAL
  Filled 2014-01-06 (×7): qty 2

## 2014-01-06 MED ORDER — LIDOCAINE HCL 1 % IJ SOLN
0.1000 mL | INTRAMUSCULAR | Status: DC | PRN
Start: 2014-01-06 — End: 2014-01-06

## 2014-01-06 MED ORDER — OXYCODONE HCL ER 10 MG PO T12A
10.0000 mg | EXTENDED_RELEASE_TABLET | Freq: Two times a day (BID) | ORAL | Status: DC
Start: 2014-01-06 — End: 2014-01-08
  Administered 2014-01-06 – 2014-01-08 (×4): 10 mg via ORAL
  Filled 2014-01-06 (×4): qty 1

## 2014-01-06 MED ORDER — ZOLPIDEM TARTRATE 5 MG OR TABS
5.0000 mg | ORAL_TABLET | Freq: Every evening | ORAL | Status: DC | PRN
Start: 2014-01-06 — End: 2014-01-08

## 2014-01-06 MED ORDER — OXYCODONE HCL 5 MG OR TABS
5.0000 mg | ORAL_TABLET | ORAL | Status: DC | PRN
Start: 2014-01-06 — End: 2014-01-08
  Administered 2014-01-06: 10 mg via ORAL
  Administered 2014-01-08: 5 mg via ORAL
  Filled 2014-01-06: qty 1

## 2014-01-06 SURGICAL SUPPLY — 37 items
BANDAGE ELASTIC W/ VELCRO 6 X 5 YDS, STERILE (Dressings/packing) ×4
BANDAGE ESMARK STERILE 6 (Dressings/packing) ×2
BASE TIBIAL ATTUNE F-B SIZE 5 METALLIC CEMENTED (Cups/Liners/Heads/Shells/Body) ×2 IMPLANT
BLADE RECIPROCATING, HEAVY DUTY LONG (Knives/Blades) ×2 IMPLANT
BLADE SAGITTAL DUAL CUT (Knives/Blades) ×2 IMPLANT
BONE CEMENT SIMPLEX P RADIOPAQUE (Matrix/Putty) ×4 IMPLANT
CEMENT MIXING SYSTEM W/ BREAKAWAY NOZZLE (Misc Medical Supply) ×2 IMPLANT
COVER LIGHT HANDLE (Drape/Gowns/Gloves/Pack) ×4 IMPLANT
COVER SUPPORT HIP GRIP (Misc Medical Supply) ×2 IMPLANT
DERMABOND ADVANCE 0.7ML (Suture)
DRAPE IOBAN 2 ANTIMICROBIAL 23" X 17" (Drapes/towels) ×2
DRAPE ORTHOMAX EXTREMITY T DRAPE, STERILE (Drapes/towels) ×2
DRESSING MEPILEX BORDER AG SILVER 4 X 10 IN (Dressings/packing) ×2 IMPLANT
FEMUR ATTUNE PS LT SIZE  5 CEM ×2 IMPLANT
FILM IOBAN 2 ANTIMICROBIAL 23" X 17" (Drapes/towels) ×2 IMPLANT
GLOVE BIOGEL INDICATOR UNDERGLOVE SIZE 8 (Drape/Gowns/Gloves/Pack) ×20 IMPLANT
GLOVE SURGEON BIOGEL SIZE 7.5 (Drape/Gowns/Gloves/Pack) ×20 IMPLANT
HOOD FLYTE STERI-SHIELD (Misc Medical Supply) ×6 IMPLANT
HOOD STERI-SHIELD (Misc Medical Supply) ×6
INSERT ATTUNE PS FB SIZE  5 6MM ×2 IMPLANT
PAD GROUND VALLEYLAB REM ADULT E7507 (Misc Medical Supply) ×2 IMPLANT
PAD UNDERCAST WEBRIL 6X4YD (Dressings/packing) ×2
PREP DURAPREP SURGICAL SOLUTION 26 ML (Misc Surgical Supply) ×6 IMPLANT
RASP RECIPORCATING (Drills/Bits/Burs/Taps/Reamers) ×2 IMPLANT
SEQUENTIAL COMPRESSION STOCKINGS REGULAR (Misc Medical Supply) IMPLANT
SET INTERPULSE HANDPIECE W/ HF TIP AND SUCTION (Misc Medical Supply) ×2
SOLUTION IRR BAG .9% N/S 3000ML (Non-Pharmacy Meds/Solutions) ×2 IMPLANT
SOLUTION IRR POUR BTL 0.9% NS 1000ML (Non-Pharmacy Meds/Solutions) ×2 IMPLANT
SOLUTION IRR POUR BTL H20 1000ML (Non-Pharmacy Meds/Solutions) ×10
STOCKING KNEE LG REGULAR (Misc Medical Supply) ×2 IMPLANT
STRYKER PATELLA ATTUNE 32MM MEDIALIZED DOME CEMENTED (Prosthesis) ×2 IMPLANT
SURGICAL PACK TOTAL JOINT TRAY CUSTOM (Procedure Packs/kits) ×2 IMPLANT
SUTURE STRATAFIX SPIRAL 3-0 12" FS SXMD2B412 (Suture) ×2
SUTURE STRATAFIX SPIRAL PDO 1 14" CTX SXPD2B405 (Suture) ×2 IMPLANT
SUTURE VICRYL PLUS 0 18" MO-4 CR VCP701D (Suture) ×2
SUTURE VICRYL PLUS 1 18" MO-4 CR VCP702D (Suture) ×2
TOURNIQUET 34 DUAL HOSE STRL (Misc Medical Supply) ×2 IMPLANT

## 2014-01-06 NOTE — Op Note (Addendum)
ORTHOPAEDIC OPERATIVE REPORT    Date of Operation: 01/06/2014    PREOPERATIVE DIAGNOSIS: LEFT knee osteoarthritis.   POSTOPERATIVE DIAGNOSIS: LEFT knee osteoarthritis.   PROCEDURE PERFORMED: LEFT total knee arthroplasty.   SURGEON/STAFF: Chancy Milroy, MD   ASSISTANT: Gertie Baron, PA-C; Leodis Rains, MD; Ellwood Dense, MD    PLEASE NOTE; THERE WAS NO RESIDENT WITH SUFFICIENT LEVEL OF TRAINING AVAILABLE TO ASSIST      ANESTHESIA: SPINAL plus adductor canal nerve block.   ESTIMATED BLOOD LOSS: 25 mL.   COMPLICATIONS: None.   TOURNIQUET TIME: Approximately 80 minutes.   IMPLANTS: I used DePuy ATTUNE femur size 5; tibia, size 5; posterior stabilized polyethylene insert 6 mm; patellar button 32 mm.     INDICATIONS FOR SURGERY: Angela Calhoun is a very pleasant 68 year old female who has osteoarthritis of the left knee and has failed nonoperative modalities.  The patient wishes to proceed with the knee replacement surgery.  The patient now has progressed to severe joint pain with significant limitation in activity due to this pain.  Patient is unable to walk unlimited without some form of support due to pain.  Patient has difficulty changing positions or going up or down stairs due to severe pain.  The patient also has night pain and pain that affects activities of daily living.  The patient has been educated on extensive conservative treatment including activity modifications, weight loss, physical therapy, bracing, oral medications and injections.  The patient has tried and failed or refused all conservative treatment options.  NSAIDs were not effective or discontinued due to GI or cardiovascular risk profile.  Physical therapy has not improved symptoms. Injections were offered and failed to improve symptoms or function.  Despite these measures pain has increased and function is decreasing.  The patient currently has severe pain with all passive range of motion of the joint. There is notable limitation in the range  of motion of the joint with active and passive range of motion.  The patient has an antalgic gait pattern due to pain and joint dysfunction.  Recent radiographs of the knee have been reviewed and demonstrate severe degenerative joint disease with joint space narrowing, osteophyte formation and subchondral sclerosis.  The patient is an excellent candidate for joint replacement due to increased pain, declining function, physical examination and radiographic evidence of severe osteoarthrtis and failure of conservative measures.      We discussed pros and cons, risks, benefits, and expected outcome and rehab. I answered all questions and the patient wishes to proceed.     PROCEDURE IN DETAIL: The patient was met in the preoperative area. The left lower extremity was marked. The patient had a femoral nerve block and catheter placed in the pre-operative area. The patient was brought back to the operating room and transferred to the table in the supine position. Spinal anesthesia was induced. The patient was given 1 gram of Ancef pre-operatively. We placed a tourniquet on the left thigh. We prepped and draped the left lower extremity in the usual sterile fashion.  Next, we exsanguinated the limb and elevated the tourniquet.  We next made a skin incision anterior midline and carried the dissection down to the extensor mechanism. I then made a standard medial parapatellar arthrotomy for exposure of the knee joint. The joint was exposed and I started with preparation of the patella. The patella was measured, and I resected approximately 9 mm of the joint surface with an oscillating saw. I sized this to a size  32. I placed the lug drill holes in the patella, placed the patella protector, then went on to the femur.     I used intramedullary alignment in order to set the distal femoral cutting block to resect approximately 10 mm of distal femur at a 5 degree valgus angle. The cutting block was pinned in place. I then made the  distal femoral cut. I removed the cut pieces of bone. I then sized the distal femur to a size 5, double checked the rotational alignment off of the epicondylar axis and Whiteside's line and then placed the pilot drill holes into the distal femur and then placed the 4-in-1 cutting block onto the distal femur. I made the 4 distal femoral cuts, removed those cut pieces of bone and then placed the box cutting guide on the distal femur, made the box cut. I then removed that cut piece of bone and went on to prepare the tibia. I used extramedullary alignment in order to set the proximal tibial cutting block to resect approximately 9 mm of proximal tibia. The cutting block was pinned in place. I made the proximal tibia cut, removed this, double checked the flexion and extension gaps, which balanced out beautifully. Also varus and valgus balance was very nice at this point. I removed the medial and lateral menisci.  I then completed preparation of the proximal tibia, sizing it to a size 5. I placed the drill and keel punch into the tibia and then went on to trial with the actual trial implants, and found with a 6 mm insert the patient had excellent range of motion and excellent balance of the collateral ligaments through full range of motion.  I was happy with the reconstruction. I, therefore, removed all the trials, opened all the implants, irrigated and dried the bony surface. I mixed cement on the back table and then cemented the components in place starting with the tibia, followed by femur, followed by the patella. I removed all excess bone cement. I then placed the tibial insert and waited for the cement to harden. I then irrigated the knee and closed.  The joint was closed with a combination of 0 Vicryl and #2 Quill. The dermis was closed with 0 Vicryl, the subcutaneous layer with 2-0 Vicryl, the skin with 2-0 Quill in a running, subcuticular fashion, followed by Dermabond, Telfa, 4 x 4's, and an Ace wrap. The patient  was then awakened, extubated, and transferred to the recovery room in stable condition.     POSTOPERATIVE PLAN: Weightbearing as tolerated. Xarelto for DVT prophylaxis per the AAOS guidelines and Ancef for infection prophylaxis. Pedicycle while in the hospital.

## 2014-01-06 NOTE — Anesthesia Procedure Notes (Signed)
Regional Anesthesia Block Procedure Note  Date & Time: Date & Time: 01/06/2014 1:25 PM    Short Procedural Summary (Full description detailed in note below):  Procedure #1 -  Adductor canal.    Laterality - Left.    Block type - Continuous catheter.                  Indications  Procedure performed to address pain in the Knee/leg.    Patient seen and examined in the Regional sedation room area.  This procedure was performed at the request of the referring physician for post operative pain control.    Pre-existing neurological conditions : None    Universal protocol  Universal Protocol: Verbal consent obtained, risks and benefits discussed, patient states understanding of the procedure being performed, the patient's understanding of the procedure matches consent given, procedure consent matches procedure scheduled, relevant documents present and verified, site marked, required blood products, implants, devices, and special equipment available and Immediately prior to procedure a time out was called to verify the correct patient, procedure, equipment, support staff and site/side marked as required  Consent given by: patient  Patient identity confirmed by: verbally with patient, arm band, provided demographic data and hospital-assigned identification number    Vital signs monitored: ASA Monitors  Sterile skin prep: Choloraprep (Chorahexadine)  O2 administered: Face Mask    Patient Instructions  Informed patient that they should avoid bearing weight on the blocked limb without assistance until nerve block has resolved and per their surgeon's instructions      Block Procedure #1  Block procedure type: Adductor canal  Procedure laterality: Left      Nerve block:  Continuous catheter  Needle gauge to anesthetize skin entry site: 25G  Needle gauge for procedure: 17g and Touhy  Block technique: Ultrasound guided  Ultrasound guidance used to identify relevant anatomy.  Ultrasound guidance used to visualize local anesthetic  spread around nerve(s).  Ultrasound guidance used to guide needle to targeted nerve and avoid vascular puncture.  Catheter type: Non-stimulating  Brief medication summary  IV sedation as documented on MAR. See MAR for full medication details  Local anesthetic medication: Lidocaine   Local anesthetic volume: Lidocaine 2% + epi 5:364W 35 mL  Complications: None  Procedure #1 comments: Given tape allergy, benzoine not used. Skin prep pad and tegaderm only used to secure catheter. No skin irritation noted.      Block Procedure #2    Block Procedure #3    Block Procedure #4

## 2014-01-06 NOTE — H&P (Signed)
HISTORY & PHYSICAL - INTERVAL ASSESSMENT    **ONLY TO BE USED IN ADDITION TO A HISTORY & PHYSICAL**    Angela Calhoun  [id number]      This interval assessment is required for History & Physical completed less than 30 days but more than 24 hours prior to the admission or surgery. A History & Physical completed more than 30 days prior to the admission or surgery must be repeated.    Current Medical Status:  Unchanged    Medications / Allergies:  Unchanged    Review of Systems:  Unchanged    Physical Examination:  Unchanged    Laboratory or Clinical Data:  Unchanged    Modifications of Initial Care Plan:  Unchanged       The patient now has progressed to severe joint pain with significant limitation in activity due to this pain. Patient is unable to walk unlimited without some form of support due to pain. Patient has difficulty changing positions or going up or down stairs due to severe pain. The patient also has night pain and pain that affects activities of daily living.   The patient has been educated on extensive conservative treatment including activity modifications, weight loss, physical therapy, bracing, oral medications and injections. The patient has tried and failed or refused all conservative treatment options. NSAIDs were not effective or discontinued due to GI or cardiovascular risk profile. Physical therapy has not improved symptoms. Injections were offered and failed to improve symptoms or function. Despite these measures pain has increased and function is decreasing.     The patient currently has severe pain with all passive range of motion of the joint. There is notable limitation in the range of motion of the joint with active and passive range of motion. The patient has an antalgic gait pattern due to pain and joint dysfunction.     The patient is an excellent candidate for joint replacement due to increased pain, declining function, physical examination and radiographic evidence of severe  osteoarthrtis and failure of conservative measures.         Janine Ores     01/06/2014     12:31 PM

## 2014-01-06 NOTE — Interdisciplinary (Signed)
Assume care of patient, report received from [facility]. Pt awake, denies any pain at this time.

## 2014-01-06 NOTE — Anesthesia Procedure Notes (Signed)
Epidural/Spinal Block Procedure Note  Date/Time:  Date & Time: 01/06/2014 2:48 PM   Universal Protocol:  Universal Protocol: Verbal consent obtained, written consent obtained, risks and benefits discussed, patient states understanding of the procedure being performed, the patient's understanding of the procedure matches consent given, procedure consent matches procedure scheduled, relevant documents present and verified, test results available and properly labeled, site marked, required blood products, implants, devices, and special equipment available and Immediately prior to procedure a time out was called to verify the correct patient, procedure, equipment, support staff and site/side marked as required  Consent given by: patient  Patient identity confirmed by: verbally with patient, provided demographic data and hospital-assigned identification number   Procedure:    Procedure type: Spinal block  Sterile skin prep: Duraprep  Patient position: Sitting  Level: Lumbar    Interspace: L2-3    Needle gauge used to anesthetize site: 25G  Needle gauge used to perform procedure: 25g  Insertion attempts: 1  no Ane Parasthesia Yes/No    epidural CSF          no Ultrasound Guided Procedure       Comments:

## 2014-01-06 NOTE — Progress Notes (Signed)
[facility]  Postoperative Check Note    S: Patient is resting comfortably in bed in the pacu. Pain is controlled well. No CP/SOB/N/V.     O:   BP 150/82   Pulse 86   Temp(Src) 97.8 F (36.6 C)   Resp 16   Ht 5\' 7"  (1.702 m)   Wt 108.863 kg (240 lb)   BMI 37.58 kg/m2   SpO2 97%    Gen: Patient comfortably in bed, no acute distress, conversational, AAOx3.   CV: RRR per pulse   Pulm: no distress   LLE:   Surgical dressing c/d/i   Thigh & calf soft/compressible, NTTP   Sensory: exam limited due to anesthesia    Motor: exam limited due to anesthesia     2+ DP, toes WWP, BCR     A/P:  68 yo f s/p L TKA with Dr. Diona Foley on 2/2, doing well   1. Activity: wbat, pt/ot  2. Abx: Ancef for 24 hours postop   3. Pain: oxy, tylenol, dilaudid for breakthrough   4. Proph: Start Xarelto 23 hrs postop, SCDs   5. Foley: dc on POD 1  6. Diet: Regular  7. Dispo: admit to floor

## 2014-01-06 NOTE — Brief Op Note (Signed)
BRIEF OPERATIVE NOTE - KNEE ARTHROPLASTY      PREOPERATIVE DIAGNOSIS: LEFT KNEE ARTHRITIS    POSTOPERATIVE DIAGNOSIS: Same    PROCEDURE: LEFT TOTAL Knee Replacement    ATTENDING SURGEON: Vihaan Gloss  ASSISTANTS(s): Gertie Baron, PA-C; Leodis Rains, MD; Ellwood Dense, MD      ANESTHESIA: SPINAL + ADDUCTOR CANAL BLOCK    WOUND CLASSIFICATION:  Class I (clean)    WOUND CLOSURE STATUS:  All layers of surgical incision (deep and superficial) were fully closed.    FINDINGS: Degeneration consistent with OA    SPECIMENS: cut bone    Fluids/Blood Products:      IV Fluids: 1700 mL    Blood Products: none    EBL: 25 ML    Urine Output: 440 mL    COMPLICATIONS: none    Tourniquet Time: 80 minutes  DISPOSITION: to PACU stable    POST OP PLAN:  Pathway, Ancef, XARELTO for DVT prophylaxis

## 2014-01-07 LAB — HEMOGRAM, BLOOD
Hct: 36.1 % (ref 34.0–45.0)
Hgb: 11.6 gm/dL (ref 11.2–15.7)
MCH: 27.1 pg (ref 26.0–32.0)
MCHC: 32.1 % (ref 32.0–36.0)
MCV: 84.3 um3 (ref 79.0–95.0)
MPV: 10.3 fL (ref 9.4–12.4)
Plt Count: 160 10*3/uL (ref 140–370)
RBC: 4.28 10*6/uL (ref 3.90–5.20)
RDW: 15.3 % — ABNORMAL HIGH (ref 12.0–14.0)
WBC: 12.2 10*3/uL — ABNORMAL HIGH (ref 4.0–10.0)

## 2014-01-07 LAB — BASIC METABOLIC PANEL, BLOOD
BUN: 19 mg/dL (ref 6–20)
Bicarbonate: 23 mmol/L (ref 22–29)
Calcium: 9.3 mg/dL (ref 8.5–10.6)
Chloride: 104 mmol/L (ref 98–107)
Creatinine: 0.95 mg/dL (ref 0.51–0.95)
GFR: 59 mL/min
Glucose: 122 mg/dL — ABNORMAL HIGH (ref 70–115)
Potassium: 4.3 mmol/L (ref 3.5–5.1)
Sodium: 137 mmol/L (ref 136–145)

## 2014-01-07 NOTE — Progress Notes (Signed)
[facility]   Orthopaedic Surgery Progress Note    Current Hospital Stay:   1 day - Admitted on: 01/06/2014    Events:  No acute events overnight.     Subjective:   Patient resting comfortably in bed.  Pain well controlled.  No complaints. Denies CP/SOB/HA/Lightheadedness    Objective:   Vital Signs:   Temperature:  [97.7 F (36.5 C)-98.2 F (36.8 C)] 97.7 F (36.5 C) (02/03 0537)  Blood pressure (BP): (85-150)/(51-82) 106/67 mmHg (02/03 0537)  Heart Rate:  [62-88] 62 (02/03 0537)  Respirations:  [12-18] 16 (02/03 0537)  Pain Score:  [-] 8 (02/03 4098)  O2 Device:  [-] None (Room air) (02/02 1955)  SpO2:  [94 %-100 %] 96 % (02/03 0537)  Weights (last 14 days)    Date/Time Weight Who    01/06/14 1956 110.678 kg (244 lb) DS    01/06/14 1233 108.863 kg (240 lb) JD            Intake/Output Summary (Last 24 hours) at 01/07/14 1191  Last data filed at 01/07/14 4782   Gross per 24 hour   Intake 3672.5 ml   Output   1400 ml   Net 2272.5 ml       Physical Exam:   General: Well appearing, no acute distress, AAOx3  Skin: left knee dressing c/d/i  Sensation: SILT s/s/sp/dp/t nerves  Motor: 5/5 TA/GS/EHL/FHL   Peripheral vascular: palpable DP, foot wwp, BCR      Lines: PIV  Drains: None  Tubes: None    Laboratory Data:  Results for orders placed during the hospital encounter of 01/06/14   HEMOGRAM, BLOOD       Result Value Range    WBC 12.2 (*) 4.0 - 10.0 1000/mm3    RBC 4.28  3.90 - 5.20 mill/mm3    Hgb 11.6  11.2 - 15.7 gm/dL    Hct 36.1  34.0 - 45.0 %    MCV 84.3  79.0 - 95.0 um3    MCH 27.1  26.0 - 32.0 pgm    MCHC 32.1  32.0 - 36.0 %    RDW 15.3 (*) 12.0 - 14.0 %    MPV 10.3  9.4 - 12.4 fL    Plt Count 160  140 - 370 9562/ZH0   BASIC METABOLIC PANEL, BLOOD       Result Value Range    Glucose 122 (*) 70 - 115 mg/dL    BUN 19  6 - 20 mg/dL    Creatinine 0.95  0.51 - 0.95 mg/dL    GFR 59      Sodium 137  136 - 145 mmol/L    Potassium 4.3  3.5 - 5.1 mmol/L    Chloride 104  98 - 107 mmol/L    Bicarbonate 23  22  - 29 mmol/L    Calcium 9.3  8.5 - 10.6 mg/dL     Lab Results   Component Value Date    WBC 12.2 01/07/2014    HGB 11.6 01/07/2014    HCT 36.1 01/07/2014    PLT 160 01/07/2014     Lab Results   Component Value Date    NA 137 01/07/2014    K 4.3 01/07/2014    CL 104 01/07/2014    BICARB 23 01/07/2014    BUN 19 01/07/2014    CREAT 0.95 01/07/2014    GLU 122 01/07/2014    Wood 9.3 01/07/2014     No results found for this  basename: INR, PTT       Imaging:  X-ray left knee: prosthetic hardware in excellent position without evidence of fracture or failure.    Assessment: 68 year old female s/p left total knee arthroplasty (2/2) doing well postop.     Plan:   - left knee pathway/precautions  - DVT Proph:  TED/SCD's/chemoprophylaxis with Rivaroxaban 10mg  PO q day x 3 weeks to start POD #1 (23 hours postop)  - Analgesia: adductor block which is managed by regional anesthesia, pain controlled - continue current medication regimen  - Foley out POD#1 in am   - F/U POD#1 CBC in am  - Antibiotics: Ancef Q8H x 2 more doses  - Activity: PT/OT, WBAT LLE, knee pathway  - Dispo: planning home vs snf, pending pt     The patient was seen and examined with the senior resident and the above plan was discussed.      -------------------------------------------------------  Ellwood Dense, M.D.  PGY-1 Orthopaedic Surgery  Pager [phone]

## 2014-01-07 NOTE — Interdisciplinary (Addendum)
A user error has taken place: charting done on wrong patient and has been corrected.

## 2014-01-07 NOTE — Interdisciplinary (Signed)
PT Daily Treatment Note    Subjective:  RLE: No Precautions  LLE: WBAT  RUE: No Precautions  LUE: No Precautions  Additional  Precautions/Contrandications:: Fall Risk       Pain Score (0-10): 8  Pain Location: 6/10 L knee at rest and 8/10 with ROM. Pain meds on board and pt accepting therapy despite pain.    Status: Agreeable to treatment;RN ok'd treatment;Premedicated for Pain    Treatment Today  Therapeutic Activities [97530]: x1  Gait Training [97116]: x1  Therex [97110]: x1                                                                                                                                          Objective:   Objective Findings: Pt supine in bed upon arrival. Performed HEP per TKA protocol; AAROM for HS to maximize L knee ROM. Pt supine to sit EOB with HOB up 30-deg, bed rail and SBA. Pt sit to stand with FWW and CGA. Pt transferred to/from low toilet with FWW and minA/mod A, respectively. Pt amb x 150' with FWW and CGA. Demo's antalgic step through pattern with decreased LLE WB and push off. Pt transferred to chair with FWW and CGA. Pt set up on pedicycle; able to complete full forward revolutions with moderate effort. L knee PROM 2 - 88. Pt left up in chair with call light and all needs within reach.    Assessment:  Assessment: Pt tolerated PT treatment well and is making good, steady improvement. Pt's L knee ROM most limited by pain with end-range. Pt, however, increasing activity tolerance, as seen by increased walking distance. Will benefit from cont'd IPPT to progress funcitonal mob and activity tolerance in order to maximize level of IND and safety prior to D/C.  Equipment Recommendations: has own equipment    Plan:  Continue therapy for Diagnosis: Abnormality of gait  (abnormal pattern/coordination) 781.2  Focus for Next Treatment: Stair training;Gait training;Functional mobility training;Balance activities;Graded tasks to increase function;Safety instruction;Therapeutic exercise;Family  training  Frequency: 1-2 times daily    Total Treatment Time (min): 45  Total Timed Treatment (min) : 45

## 2014-01-07 NOTE — Plan of Care (Signed)
Problem: Post Operative Day 1  Goal: Knowledge and demonstration of post operative practices/ behaviors by the patient  Outcome: Met  Patient met with PT and OT today for eval and treat.  Educational materials provided and given to patient.  Tolerated pedicycle for 20 mins. Up with assist with fww.  Will continue to follow up patient.    Problem: Falls - Risk of  Goal: Absence of falls  Outcome: Met  Fall precaution in place.  Up with min assist with fww.  Call light within reach.    Problem: Pain - Acute  Goal: Communication of presence of pain  Outcome: Met  Pain rates from 5-10/10 left knee pain medicated with oxycontin 10 mg as scheduled.  Also given oxycodone 10 mg every 3 hrs as needed.  Will continue to assess and monitor pain and medicate as ordered.    Problem: Infection  Goal: Absence of infection signs and symptoms  Outcome: Met  Left knee incision with occlusive dressing, c/d/i.  No s/s of infection noted.  Patient afebrile, v/s stable.  Will continue to monitor.

## 2014-01-07 NOTE — Interdisciplinary (Signed)
OCCUPATIONAL THERAPY EVALUATION NOTE    Admission Information    Reasons for Admission: 68 y/o female with L knee OA, POD#1 s/p L TKA with adductor canal nerve block  Past Medical History: Cancer;HTN;Previous surgeries  Reasons for Therapy: Difficulty with ADL's  ADL: I with ADLs/functional mobility  Type of Home: House  Home Layout: Two level;Able to Live on Main level with bedroom/bathroom  Living Arrangements: Spouse / significant other  Bathroom: Walkin shower;Shower/tub seat;Raised toilet;Grab bars  Home Equipment: FWW;4-wheeled walker  Occupations: Retired    Objective Assessment    Parameters:    Type of Eval: Evaluation 97003  RUE: No Precautions  LUE: No Precautions  RLE: No Precautions  LLE: WBAT  Additional  Precautions/Contrandications:: Fall Risk  Equipment Present: IV;Other (comment) (adductor nerve block)  Medications Affecting Treatment: narcotics;paralytics;analgesics  Pain Score (0-10): 8  Pain Location: L knee; premedicated     Exam and Neuro Assessment:    Oral Exam: no deficits  Visual: no deficits  Auditory: no deficits  Level of Consciousness: alert  Orientation Level: oriented x4  Safety: good  Follows Commands: multistep  Neglect: none  Coordination: No deficits  Sensation: No deficits  Spasticity: None    ADL and Mobility Assessment:    Feeding: Independent  Grooming/Hygiene: min assist  Upper Body Dressing: supervision  Lower Body Dressing: min assist  Toileting: min assist  Bed Mobility: supervision  Toilet Transfer: min assist (FWW)  Sit - Stand : min assist (FWW)  Sitting Balance: Good  Standing Balance: Fair (FWW)  Equipment Needed : FWW  Endurance: good     Upper Extremity Assessment        Upper Extremity Comments: BUE strength 5/5; ROM WFL to participate in ADLs  Lower Extremity Comments: PT consulted      Treatment Today    Type of Eval: Evaluation 97003  ADL Training [97535]: x1  Therapeutic Activities [97530]: x1                Treatment Plan  The goals listed below are  achievable and realistic within the designated time frame and the treatments listed below, and referred to in the treatment plan, are necessary to achieve these goals. The functional goals were created based on the patient's prior level of function.    Diagnosis: Difficulty in walking (dec activity tolerance ) 719.7    Assessment  Assessment: Pt is a 68 y/o female with L knee OA, POD#1 s/p L TKA with adductor canal nerve block. Pt presents to OT at overall Min A for OOB ADLs and functional mobility using FWW. Pt educated on role of OT, home/ADL safety, POC. Pt would benefit from skilled OT to maximize independence with ADLs. Pt lives with husband who can assist as needed. Recommend d/c home with supervision. Thank you for this consult.     Problems  Problems: ADL impairment    Functional Limitations  Functional Limitation   Current Impairment Category: Q9476: Self Care  Current Impairment %: CJ: 20-39% impaired, limited or restriced  Goal Impairment: L4650: Self Care  Goal Impairment  %: CI: 1-19% impaired, limited or restricted  Rationale: Clinical judgement;Acceptable functional assessment    Goals  Goal #1: Pt will demo sinkside G/H tasks with SPV.   # Visits: 1-3  Progress: New  Goal #2: Pt will demo toilet and shower transfers with SPV.   # Visits: 1-3  Progress: New  Goal #3: Pt will demo LB dressing with SPV.   # Visits: 3-5  Progress: New    Recommendations  Equipment Recommendations: has own equipment    Patient to continue therapy:   Frequency: 4-7 times/wk       Total Treatment Time (min): 45  Total Timed Treatment (min) : 30

## 2014-01-07 NOTE — Interdisciplinary (Addendum)
01/07/14 1316   Patient Information   Why is Patient in the Hospital? s/p L TKA   Prior to Level of Function Ambulatory/Independent with ADL's   Assistive Device Walker   Discharge Planning   Type of Residence Private residence   Patient expects to be discharged to: home   States previously contacted by [place] [facility] for physical therapy and would like the agency. However, patient out of service area.

## 2014-01-07 NOTE — Plan of Care (Signed)
Problem: Falls - Risk of  Goal: Absence of falls  Outcome: Met  Patient educated and encouraged to press call light for any patient needs; verbal understanding received.  Call light in reach; bed alarm on; patient refused to dangle this am due to pain.  No patient falls or injuries.    Problem: Pain - Acute  Goal: Communication of presence of pain  Outcome: Met  Patient pain managed with Oxycodone 10 mg PO every 3 hours PRN; RESCUE dose 0.5 mg IV dilaudid given x 1; patient educated to press ropivacaine every 30 minutes to aid in pain relief.  Patient resting comfortably between doses.     Problem: Infection  Goal: Absence of infection signs and symptoms  Outcome: Met  Patient remains afebrile throughout shift; vital signs stable; ancef 1 gm given x 2 doses.

## 2014-01-07 NOTE — Progress Notes (Signed)
Regional Anesthesia Follow-Up Note  Peripheral Nerve Block    Referring Service: Ortho Joint  Surgeon: Diona Foley    History of Present Illness:     Angela Calhoun is a 68 year old female who was admitted on 01/06/2014 . The patient is POD  1, catheter day 2 s/p L TKA. The patient had a left adductor canal peripheral nerve block placed for perioperative/postoperative pain control.     The infusion contains ropivicaine 0.2% running at 8 mls/hour continuous, and 4 ml bolus with lockout of 30 minutes.    Regarding the pain, patient reports pain score: 0 for anterior knee, 9 for posterior knee radiating into calf.    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     s/p Left nephrectomy 08/21/2013: no CHEMO/XRT     History     Social History    Marital Status: Married     Spouse Name: N/A     Number of Children: 0    Years of Education: N/A     Occupational History    Retired; Estate agent work for company: [employer]      Social History Main Topics    Smoking status: Former Smoker -- 2.00 packs/day for 12 years     Types: Cigarettes     Quit date: 12/24/1980    Smokeless tobacco: Never Used      Comment: quit smoking in early 1980's after 12 years of 2PPD    Alcohol Use: No    Drug Use: No    Sexual Activity: Yes     Partners: Male     Other Topics Concern    Not on file     Social History Narrative    Married.  She is retired.  She does have no children.  She is a nonsmoker and denies illicit drug use.  She eats no special diet.    Regular diet    Minimal Exercise     Allergies   Allergen Reactions    Dermabond [Other] Rash    Adhesive Tape Rash     Current Facility-Administered Medications   Medication     acetaminophen (TYLENOL) tablet 650 mg    acetaminophen (TYLENOL) tablet 650 mg    artificial tears ophthalmic solution 1 drop    ascorbic acid (VITAMIN C) tablet 500 mg    [START ON 01/08/2014] bisacodyl (DULCOLAX) suppository 10 mg    calcium carbonate (TUMS) chewable tablet 500 mg    [START ON 01/08/2014] dexamethasone (DECADRON) tablet 4 mg    diphenhydrAMINE (BENADRYL) tablet 25 mg    docusate sodium (COLACE) capsule 250 mg    famotidine (PEPCID) tablet 20 mg    hydrochlorothiazide (HYDRODIURIL) tablet 12.5 mg    HYDROmorphone (DILAUDID) injection 0.5 mg    levothyroxine (SYNTHROID) tablet 137 mcg    lisinopril (PRINIVIL, ZESTRIL) tablet 10 mg    magnesium hydroxide (MILK OF MAGNESIA) suspension 30 mL    menthol (CEPACOL) lozenge 3 mg    metoclopramide (REGLAN) injection 10 mg    multivitamin tablet 1 tablet  nalOXone (NARCAN) injection 0.1 mg    ondansetron (ZOFRAN) injection 4 mg    oxyCODONE (OXYCONTIN) ER tablet 10 mg    oxyCODONE (ROXICODONE) tablet 10 mg    oxyCODONE (ROXICODONE) tablet 5 mg    oxyCODONE (ROXICODONE) tablet 5 mg    psyllium (METAMUCIL) packet 6 g    rivaroxaban (XARELTO) tablet 10 mg    ropivacaine 0.2% via Regional Pain Pump    simvastatin (ZOCOR) tablet 10 mg    sodium chloride 0.9% infusion    [START ON 01/09/2014] sodium phosphates (FLEET) enema 1 bottle    zolpidem (AMBIEN) tablet 5 mg       Review of Systems:    Positive for: none  Negative for: tinnitus, perioral numbness, headache, dizziness, sedation, insomnia, pruritis, nausea/vomiting, weakness and seizures    Physical Exam:  Vitals: BP 124/76   Pulse 83   Temp(Src) 36.4 C   Resp 18   Ht 5\' 7"  (1.702 m)   Wt 110.678 kg (244 lb)   BMI 38.21 kg/m2   SpO2 99%  Catheter is in situ  Catheter site is clean and dry without erythema induration or tenderness to palpation  Sensory block: appropriate  Motor block: absent    Assessment:  This is a 68 year old female who has excellent pain control with regional  nerve catheter to anterior knee.    Plan:   Continue current infusion. Patient re-educated that nerve block will only help control pain to anterior knee and that she must request systemic analgesics for relief of posterior knee pain. Patient understands, all questions answered.    For questions or concerns please contact the regional anesthesia fellow/resident on-call.  Note Author: Karma Lew, MD

## 2014-01-07 NOTE — Interdisciplinary (Signed)
PHYSICAL THERAPY EVALUATION NOTE    Admission Information    Reasons for Admission: 68 y/o female with L knee OA, POD#1 s/p L TKA with adductor canal nerve block  Past Medical History: HTN;Cardiac;Previous surgeries  Medical History: dyslipidemia, GERD, L kidney carcinoma  Reasons for Therapy: Difficulty with mobility  Mobility: Independent with mobility  Type of Home: House  Home Layout: Two level;Bed/Bath upstairs;Able to Live on Main level with bedroom/bathroom  Living Arrangements: Spouse / significant other  Bathroom: Walkin shower;Shower/tub seat  Home Equipment: FWW;4-wheeled walker;SPC  Occupations: Retired    Objective Assessment    Parameters:    Type of Eval: Evaluation  Additional  Precautions/Contrandications:: Fall Risk  LLE: WBAT  Equipment Present: IV;Other (comment) (adductor block)  Medications Affecting Tx: Analgesics;Narcotics  Pain Score (0-10): 8  Pain Location: 6/10 resting L knee pain. Increased to 8/10 with ROM. RN aware and pain meds on board.    Exam:    Oral Exam: No deficits  Visual: no deficits  Auditory: WNL  Level of Consciousness: Alert  Orientation Level: Oriented X4  Safety: Good  Follows Commands: Multistep  Neglect: None  Coordination: no deficits  Sensation: no deficits  Spasticity: none  Tone: normal  Upper Extremity Comments: OT consulted, please see OT eval    Mobility Assessment:    Rolling: supervision (SBA)  Supine - Sit: supervision (SBA)  Sit - Supine: supervision (SBA)  Sit - Stand: min assit (w/ FWW)  Stand - Sit: min assist (CGA w/ FWW)  Chair Transfer: min assist (CGA w/ FWW)  Ambulation: min assist (CGA w/ FWW)  Feet Ambulated: 60  Description of Gait: Antalgic step to pattern with LLE leading. Decreased WB through LLE.  Equipment Needed: FWW  Static Balance - Sitting: Good  Static Balance - Standing: Good  Dynamic Balance - Sitting: Good  Dynamic Balance - Standing: Fair  Comments: Pt performed HEP per TKA protocol. Pt set up on pedicycle and educated in pedicycle  protocol. U/A to perform full revolutions.  Activity Tolerance: good    Lower Extremity Assessment       Lower Extremity Comments: RLE grossly 4+/5 MMT throughout. LLE grossly 4+/5 MMT, except hip FLX and knee EXT 3+/5 with associated pain increase. L knee PROM 2 - 70.  Upper Extremity Comments: OT consulted, please see OT eval    Treatment Today  Type of Eval: Evaluation  Gait Training [97116]: x1  Therex [97110]: x1          Treatment Plan  The goals listed below are achievable and realistic within the designated time frame and the treatments listed below, and referred to in the treatment plan, are necessary to achieve these goals. The functional goals were created based on the patient's prior level of function.    Diagnosis: Abnormality of gait  (abnormal pattern/coordination) 781.2    Assessment  Assessment: Pt is a 68 y/o female POD#1 s/p L TKA with adductor block. Pt tolerated PT eval.Tx well and currently req's min A - CGA with FWW for OOB mobility. Pt presents with impaired gait, decreased LLE strength and impaired L knee ROM. Pt was educated in the role of PT, safety, HEP and POC. Will benefit from cont'd IPPT to progress funcitonal mob and activity tolerance in order to maximize level of IND and safety prior to D/C. Anticipate D/C home with cont'd PT once medically clear and pending stair clearance. Husband able to provide SPV at home.    Problems  Problems: Functional mobility impairment;ROM  impairment;Strength impairment;Activity tolerance impairment    Functional Limitations  Functional Limitation   Current Impairment Category: W1093: Mobility  Current Impairment %: CJ: 20-39% impaired, limited or restriced  Goal Impairment: A3557: Mobility  Goal Impairment %: CI: 1-19% impaired, limited or restricted  Rationale: Clinical judgement    Goals  Patient Stated Goal: "To be able to get up stairs."  Goal #1: Pt will be SPV with all functional transfers with LRAD.  # Visits: 3-5  Progress: New  Goal #2: Pt  will ambulate >/= 200' with LRAD and SPV.  # Visits: 7-10  Progress: New  Goal #3: Pt will negotiate one flight of stairs with one handrail and LRAD with CGA.  # Visits: 7-10  Progress: New    Recommendations  Equipment Recommendations: has own equipment    Patient to continue therapy:   Frequency: 1-2 times daily  Focus for Next Treatment: Stair training;Gait training;Functional mobility training;Balance activities;Graded tasks to increase function;Safety instruction;Therapeutic exercise;Family training    Total Treatment Time (min): 45  Total Timed Treatment (min) : 30

## 2014-01-08 MED ORDER — OXYCODONE HCL ER 10 MG PO T12A
10.0000 mg | EXTENDED_RELEASE_TABLET | Freq: Two times a day (BID) | ORAL | Status: DC
Start: 2014-01-08 — End: 2015-12-22

## 2014-01-08 MED ORDER — DOCUSATE SODIUM 250 MG OR CAPS
250.0000 mg | ORAL_CAPSULE | Freq: Two times a day (BID) | ORAL | Status: DC
Start: 2014-01-08 — End: 2015-12-22

## 2014-01-08 MED ORDER — OXYCODONE HCL 5 MG OR TABS
5.0000 mg | ORAL_TABLET | ORAL | Status: DC | PRN
Start: 2014-01-08 — End: 2015-12-22

## 2014-01-08 MED ORDER — RIVAROXABAN 10 MG PO TABS
10.0000 mg | ORAL_TABLET | ORAL | Status: DC
Start: 2014-01-09 — End: 2015-12-22

## 2014-01-08 NOTE — Discharge Instructions (Signed)
General Instructions      Wear your white stockings during the day - these help control the typical swelling in your legs after surgery and minimize the likelihood of blood clots.      Elevate your leg when you are resting to help minimize the swelling. Keep your knee straight while lying in bed, you can place a pillow under your heal to help with this.       Use ice to help control the swelling and pain.  DO NOT USE HEAT - this will increase the swelling.       Call the office or operator if you develop fevers (over 100.5) or chills, develop drainage from the site of the wound or redness surrounding the wound, increased pain, increased leg swelling, lightheadedness or headache.       Go to the emergency room if you develop chest pain, shortness of breath, or have a change in mental status.       You should have an office appointment about 4 weeks following your discharge from the hospital.  If you do not, please call [phone]. Generally, you should return to see your surgeon at the following intervals, but this may be individualized depending on special circumstances.    Follow-up appointments (from date of surgery):     Approximately 4 weeks after surgery     Approximately 4 to 6 months after surgery     One year after surgery     Then once every 2 to 4 years.     Wound Care      You may shower 3 days after surgery if the wound is dry and has NO drainage. Keep water exposure to the incision site brief and blot it dry when you get out.  Do not bathe or swim or Jacuzzi (ie. Do not submerge the incision) for approximately 3 to 4 weeks.      Do not use ointments or creams on the incision.        Keep the incision clean and dry.  Any drainage from the incision should be reported to the doctor immediately.        You may notice some bruising around the incision and into the thigh or leg.  This is not uncommon and should begin to go away within the first 2 weeks after surgery      When the surgical incision was made  down the front of your knee the nerves in the skin were divided.  For this reason the skin in this area may feel fuzzy or numb. This is normal for patients with knee replacement surgery and the sensation will decrease with time.    You may have swelling and warmth about the knee persistently for 6 weeks after surgery. This will gradually decrease, but it may take 6 months to a year for the swelling to resolve completely.    Activity      Unless otherwise instructed by your doctor you can put all of your weight on your operated leg.        You may walk as much as you can tolerate with your walker or crutches.        You can slowly progress to full weight bearing under the direction of your physical therapist within about 2-4 weeks.      The question of when to return to driving is impossible to generalize to everyone because it is largely dependent on the individual.  Importantly, doctors do not have   a license with the DMV to clear you or release you to return to driving.  There are 3 primary criteria that must be met.  You need to be off of narcotic pain medicines (otherwise you are driving under the influence).  You need to be able to get in and out of the drivers seat comfortably.  And you must have regained your normal reflexes / strength.  Also, return to driving depends partly on what side had surgery (ie. Right leg operates the pedals; people with Left side surgery can generally get back to driving much sooner unless you have a clutch).  The average time to return to driving is around 4 weeks for the right side and usually sooner for the left.  We recommend testing yourself with another licensed driver in an empty parking lot or quiet street first in order to check your reflexes moving your foot from pedal to pedal.    Medications    - TAKE ALL MEDICATIONS AS PRESCRIBED -    You will likely be taking both a long acting and a short acting pain narcotic medication after surgery.  It is recommended that  you begin to wean off of the long acting medication about 7-10 days after surgery.  The short acting medication can be continued as needed.  To help wean off of the pain medications or to supplement pain control you can use Tylenol. Do not mix pain medications with alcohol. Do not drive while taking narcotics.      If you are taking a blood thinner such as Lovenox, Xarelto or Coumadin, DO NOT take aspirin or anti-inflammatories such as Advil, Aleve, Motrin or ibuprofen.      You will need to take antibiotics prior to any dental appointment for at least 2 years following surgery.  This prescription is typically provided for you at your 4 month post-operative visit.      You will not be discharged from the hospital with any antibiotics unless there are specific concerns regarding infection.

## 2014-01-08 NOTE — Interdisciplinary (Signed)
PHYSICAL THERAPY DISCHARGE SUMMARY    Diagnosis: Abnormality of gait  (abnormal pattern/coordination) 781.2    Discharge Date: 01/08/14  Pain Score (0-10): 5  Pain Location: L knee    Treatment today:   Therapeutic Activities [16606]: x1  Gait Training [97116]: x1  Therex [97110]: x1        Objective Findings: Pt sitting up in chair upon arrival. Pt performed seated heel slides x 10. L knee PROM 0 - 88. Pt sit to stand with FWW and SPV. Pt amb x 200' with FWW and SPV. Demo's step through pattern with decreased L stance and push off. Pt demo's good safety awareness; no VCing for FWW mgmt or posture. Pt transferred to chair with FWW and SPV. PT setup on pedicycle; able to perform full, forward revolutions. Pt ed re: stairs. Pt reports that she has a bed/bathroom downstairs and does not have to go up stairs. Pt ed re: safety and the imporance of pre-planning mvmts. Pt left up in chair with call light and all needs wihtin reach,    Assessment: Pt tolerated PT treatment well and made good progress while in house. Pt is now SPV with all functional transfers and gait with FWW. Did not practice stair negotiation 2/2 pt able to stair downstairs at home. Pt no longer has skilled IPPT needs. Recommend D/C home with cont'd PT once medically cleared. HUsband able to provide 24/7 SPV.        Goals Met: partially met   Goal Status: Stair goal not met 2/2 pt able to stay downstairs at home.  Response toTreatment: Good  Goal Impairment: Y0459: Mobility  Goal Impairment %: CI: 1-19% impaired, limited or restricted  Discharge Impairment: G8980: Mobility  Discharge Impairment %: CI: 1-19% impaired, limited or restricted  Rationale: Clinical judgement  Destination : Home  Follow Up Care: Physician;Home Health     Total Treatment Time (min): 45  Total Timed Treatment (min) : 45

## 2014-01-08 NOTE — Interdisciplinary (Signed)
Silver mepilex dressing changed as per MD.Manooch Gobar is the  Main RN of this patient. Helped him to d/c pt. Home.  Reviewed med rec and dc rx with patient and family member at  bedside including f/u appt. Patient verbalized understanding. IV removed with tip intact.NO s/s of infections noted. Instructed on s\sx to monitor upon discharge. All questions addressed. D/C papers given to pt. Aware. Endorsed to [place].

## 2014-01-08 NOTE — Plan of Care (Signed)
Problem: Falls - Risk of  Goal: Absence of falls  Outcome: Met  Patient is out of bed using walker and stand by assist.  Gait is unsteady at times; ongoing PT/OT;  encouraged patient to press call light for any patient needs; no patient falls or injuries    Problem: Pain - Acute  Goal: Communication of presence of pain  Outcome: Met  Left Knee pain managed with Oxycontin 5-10 mg PO PRN; patient is resting comfortably between doses.

## 2014-01-08 NOTE — Progress Notes (Signed)
Regional Anesthesia Follow-Up Note  Peripheral Nerve Block    Referring Service: Ortho Joint  Surgeon: Diona Foley    History of Present Illness:     Angela Calhoun is a 68 year old female who was admitted on 01/06/2014 . The patient is POD  2, catheter day 3 s/p L TKA. The patient had a left adductor canal peripheral nerve block placed for perioperative/postoperative pain control.     The infusion contains ropivicaine 0.2% running at 8 mls/hour continuous, and 4 ml bolus with lockout of 30 minutes. Block discontinued this morning 8am by primary team.    Regarding the pain, patient reports pain score: Controlled.    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     s/p Left nephrectomy 08/21/2013: no CHEMO/XRT     History     Social History    Marital Status: Married     Spouse Name: N/A     Number of Children: 0    Years of Education: N/A     Occupational History    Retired; Estate agent work for company: [employer]      Social History Main Topics    Smoking status: Former Smoker -- 2.00 packs/day for 12 years     Types: Cigarettes     Quit date: 12/24/1980    Smokeless tobacco: Never Used      Comment: quit smoking in early 1980's after 12 years of 2PPD    Alcohol Use: No    Drug Use: No    Sexual Activity: Yes     Partners: Male     Other Topics Concern    Not on file     Social History Narrative    Married.  She is retired.  She does have no children.  She is a nonsmoker and denies illicit drug use.  She eats no special diet.    Regular diet    Minimal Exercise     Allergies   Allergen Reactions    Dermabond [Other] Rash    Adhesive Tape Rash     Current Facility-Administered Medications   Medication     acetaminophen (TYLENOL) tablet 650 mg    acetaminophen (TYLENOL) tablet 650 mg    artificial tears ophthalmic solution 1 drop    ascorbic acid (VITAMIN C) tablet 500 mg    bisacodyl (DULCOLAX) suppository 10 mg    calcium carbonate (TUMS) chewable tablet 500 mg    diphenhydrAMINE (BENADRYL) tablet 25 mg    docusate sodium (COLACE) capsule 250 mg    famotidine (PEPCID) tablet 20 mg    hydrochlorothiazide (HYDRODIURIL) tablet 12.5 mg    HYDROmorphone (DILAUDID) injection 0.5 mg    levothyroxine (SYNTHROID) tablet 137 mcg    lisinopril (PRINIVIL, ZESTRIL) tablet 10 mg    magnesium hydroxide (MILK OF MAGNESIA) suspension 30 mL    menthol (CEPACOL) lozenge 3 mg    metoclopramide (REGLAN) injection 10 mg    multivitamin tablet 1 tablet    nalOXone (NARCAN) injection 0.1 mg    ondansetron (ZOFRAN) injection 4 mg  oxyCODONE (OXYCONTIN) ER tablet 10 mg    oxyCODONE (ROXICODONE) tablet 10 mg    oxyCODONE (ROXICODONE) tablet 5 mg    oxyCODONE (ROXICODONE) tablet 5 mg    psyllium (METAMUCIL) packet 6 g    rivaroxaban (XARELTO) tablet 10 mg    simvastatin (ZOCOR) tablet 10 mg    sodium chloride 0.9% infusion    [START ON 01/09/2014] sodium phosphates (FLEET) enema 1 bottle    zolpidem (AMBIEN) tablet 5 mg       Review of Systems:    Positive for: none  Negative for: tinnitus, perioral numbness, headache, dizziness, sedation, insomnia, pruritis, nausea/vomiting, weakness and seizures    Physical Exam:  Vitals: BP 111/65   Pulse 65   Temp(Src) 36.6 C   Resp 16   Ht 5\' 7"  (1.702 m)   Wt 110.678 kg (244 lb)   BMI 38.21 kg/m2   SpO2 96%  Catheter is in situ  Catheter site is clean and dry without erythema induration or tenderness to palpation  Sensory block: appropriate  Motor block: absent    Assessment:  This is a 68 year old female who has excellent pain control with regional nerve catheter.    Plan:   Catheter discontinued with tip intact. Will f/u residual sensory block tomorrow.    For questions or  concerns please contact the regional anesthesia fellow/resident on-call.  Note Author: Karma Lew, MD

## 2014-01-08 NOTE — Interdisciplinary (Signed)
AVS sent via [facility] to [facility]. Talked with intake at [facility] who states patient scheduled for start of care tomorrow.

## 2014-01-08 NOTE — Progress Notes (Signed)
[facility]   Orthopaedic Surgery Progress Note    Current Hospital Stay:   2 days - Admitted on: 01/06/2014    Events:  No acute events overnight. Ambulated 150' with fww with PT yesterday.     Subjective:   Patient resting comfortably in bed.  Pain well controlled.  No complaints. Denies CP/SOB/HA/Lightheadedness    Objective:   Vital Signs:   Temperature:  [97.4 F (36.3 C)-97.9 F (36.6 C)] 97.4 F (36.3 C) (02/04 0400)  Blood pressure (BP): (110-124)/(56-76) 119/65 mmHg (02/04 0400)  Heart Rate:  [65-83] 74 (02/04 0400)  Respirations:  [14-18] 14 (02/04 0400)  Pain Score:  [-] 5 (02/04 0537)  O2 Device:  [-] None (Room air) (02/04 0400)  SpO2:  [95 %-99 %] 98 % (02/04 0400)  Weights (last 14 days)    Date/Time Weight Who    01/06/14 1956 110.678 kg (244 lb) DS    01/06/14 1233 108.863 kg (240 lb) JD            Intake/Output Summary (Last 24 hours) at 01/07/14 7062  Last data filed at 01/07/14 3762   Gross per 24 hour   Intake 3672.5 ml   Output   1400 ml   Net 2272.5 ml       Physical Exam:   General: Well appearing, no acute distress, AAOx3  Skin: left knee dressing c/d/i  Sensation: SILT s/s/sp/dp/t nerves  Motor: 5/5 TA/GS/EHL/FHL   Peripheral vascular: palpable DP, foot wwp, BCR      Lines: PIV  Drains: None  Tubes: None    Laboratory Data:  Results for orders placed during the hospital encounter of 01/06/14   HEMOGRAM, BLOOD       Result Value Range    WBC 12.2 (*) 4.0 - 10.0 1000/mm3    RBC 4.28  3.90 - 5.20 mill/mm3    Hgb 11.6  11.2 - 15.7 gm/dL    Hct 36.1  34.0 - 45.0 %    MCV 84.3  79.0 - 95.0 um3    MCH 27.1  26.0 - 32.0 pgm    MCHC 32.1  32.0 - 36.0 %    RDW 15.3 (*) 12.0 - 14.0 %    MPV 10.3  9.4 - 12.4 fL    Plt Count 160  140 - 370 8315/VV6   BASIC METABOLIC PANEL, BLOOD       Result Value Range    Glucose 122 (*) 70 - 115 mg/dL    BUN 19  6 - 20 mg/dL    Creatinine 0.95  0.51 - 0.95 mg/dL    GFR 59      Sodium 137  136 - 145 mmol/L    Potassium 4.3  3.5 - 5.1 mmol/L    Chloride  104  98 - 107 mmol/L    Bicarbonate 23  22 - 29 mmol/L    Calcium 9.3  8.5 - 10.6 mg/dL     Lab Results   Component Value Date    WBC 12.2 01/07/2014    HGB 11.6 01/07/2014    HCT 36.1 01/07/2014    PLT 160 01/07/2014     Lab Results   Component Value Date    NA 137 01/07/2014    K 4.3 01/07/2014    CL 104 01/07/2014    BICARB 23 01/07/2014    BUN 19 01/07/2014    CREAT 0.95 01/07/2014    GLU 122 01/07/2014    [place] 9.3 01/07/2014  No results found for this basename: INR,  PTT       Imaging:  X-ray left knee: prosthetic hardware in excellent position without evidence of fracture or failure.    Assessment: 68 year old female s/p left total knee arthroplasty (2/2) doing well postop.     Plan:   - left knee pathway/precautions  - DVT Proph:  TED/SCD's/chemoprophylaxis with Rivaroxaban 10mg  PO q day x 3 weeks to start POD #1 (23 hours postop)  - Analgesia: adductor block which is managed by regional anesthesia, pain controlled - continue current medication regimen  - Foley out POD#1 in am   - F/U POD#1 CBC in am  - Antibiotics: Ancef Q8H x 2 more doses  - Activity: PT/OT, WBAT LLE, knee pathway  - Dispo: planning home vs snf, pending pt, possibly today     The patient was seen and examined with the senior resident and the above plan was discussed.      -------------------------------------------------------  Ellwood Dense, M.D.  PGY-1 Orthopaedic Surgery  Pager [phone]

## 2014-01-08 NOTE — Interdisciplinary (Signed)
OT Daily Treatment Note    Subjective:       Pain Score (0-10): 4  Pain Location: left knee    Status: Agreeable to treatment;RN ok'd treatment    Treatment Today  ADL Training [22297]: x3              Objective:  Objective Findings: Pt seen for functional acts. Pt supine-sit with SPV. Pt donn/doff pants with SPV. Pt sit-stand with SPV. Pt pull up pants to waist with SPV. Pt ambulated to toilet with FWW with SPV. Pt transferred on/off BSC with SPV. Pt ambulated to sink with FWW with SPV. Pt completed G&H with setup with SPV. Pt ambulated to shower with SPV. Pt transferred in/out of shower with SPV. Pt ambulated to bedside chair with SPV. Pt transferred to chair with SPV. Pt left up in chair with family in room, call bell at side.     Assessment:  Assessment: Pt increased all functional acts to SPV. Pt good safety with transfers. Pt good functional standing balance. Pt good LB dressing acts. Pt husband will bel at home to assist. Pt would be able to go home with assist. Pt has met goals. Will consult primary therapist for further OT needs.        Plan:  Continue therapy for Diagnosis: Difficulty in walking (dec activity tolerance ) 719.7     Focus for Next Treatment:  (Will consult primary therapist for further OT needs)    Total Treatment Time (min): 40  Total Timed Treatment (min) : 40

## 2014-01-08 NOTE — Interdisciplinary (Signed)
Patient given verbal and written discharge instructions regarding diet, activity restrictions, s&s to seek medical attention for, f/u apt, pre admission meds to continue/discontinue, and discharge medications.

## 2014-01-09 ENCOUNTER — Telehealth (INDEPENDENT_AMBULATORY_CARE_PROVIDER_SITE_OTHER): Payer: Self-pay | Admitting: Anesthesiology

## 2014-01-09 NOTE — Telephone Encounter (Signed)
Regional Anesthesia Telephone Encounter Follow-Up Note  Peripheral Nerve Block    History of Present Illness:     Angela Calhoun is a 68 year old female who is POD 3 status post L TKA. The patient had a left adductor canal peripheral nerve block catheter placed for post-operative pain/intraoperative anesthesia, discontinued yesterday prior to hospital discharge.     Patient reports:  Nerve block site is clean and dry without erythema, induration or tenderness to palpation  Sensory block: absent  Motor block: absent    Assessment:  This is a 68 year old female  who had a regional nerve block that has resolved.    Plan:   No further intervention required  Patient advised to call [phone] and page the regional anesthesia fellow/resident on-call with any questions or concerns.

## 2014-01-09 NOTE — Interdisciplinary (Signed)
OCCUPATIONAL THERAPY DISCHARGE SUMMARY    Diagnosis: Difficulty in walking (dec activity tolerance ) 719.7    Discharge Date: 01/08/14      Response of Treatment: Good  Goal Impairment: X1062: Self Care  Goal Impairment  %: CI: 1-19% impaired, limited or restricted  Discharge Impairment: I9485: Self Care  Discharge Impairment %: CI: 1-19% impaired, limited or restricted  Rationale: Acceptable functional assessment;Clinical judgement  Goals Met: met     Destination : Home  Follow Up Care: Physician;Home Health

## 2014-01-09 NOTE — Progress Notes (Signed)
I saw and examined the patient with the fellow and agree with the assessment and plan.  Pain is well controlled.  Will continue current infusion.

## 2014-01-09 NOTE — Discharge Summary (Signed)
Patient Name:  Angela Calhoun    Principal Diagnosis (required):  Left Knee Arthritis    Hospital Problem List (required):  Left Knee Arthritis     Patient Active Problem List   Diagnosis    Osteoarthritis of hip    Hypertensive disorder    Gastroesophageal reflux disease    Hypothyroidism    S/P prosthetic total arthroplasty of the hip    Dyslipidemia    HTN (hypertension)    GERD (gastroesophageal reflux disease)    Arthritis of knee    Kidney carcinoma       Additional Hospital Diagnoses ("rule out" or "suspected" diagnoses, etc.):  None    Principal Procedure During This Hospitalization (required):  Left Total Knee Arthroplasty performed on 01/06/14    Other Procedures Performed During This Hospitalization (required):  None     Consultations Obtained During This Hospitalization:  Regional anesthesia  PT  OT  CM    Discharge disposition  Stable to home    Reason for Admission to the Hospital / History of Present Illness:  Per preop office visit 12/30/13:  Angela Calhoun is a 68 year old female who is here for recheck of her left knee. She has had years of progressive knee pain and disability. She has global knee pain. She is limited in her gait tolerance. She is ready for knee replacement. Previous hip replacement doing well.    Hospital Course by Problem (required):  The patient presented to the preoperative holding area on day of surgery and admission. The patient was taken to the operating room for the planned operation. The knee arthroplasty proceeded uneventfully and without complication - please see operative details for report.  The patient was transferred to the PACU in stable condition and then admitted to the floor on the Orthopaedic Surgery Adult Reconstruction service.     The post-operative plan was consistent with the knee arthroplasty pathway, which included the following:  Foley removal on POD#1  Xarelto daily x 3 weeks for DVT prophylaxis  PO pain medication  24 hours of  prophylactic antibiotics  Working with PT and use of a pedicycle three times daily     A hemogram was also performed and was consistent with expected acute blood loss anemia; the patient remained asymptomatic and stable throughout the hospitalization.     Patient was ready for Discharge on POD#2.    On the day of discharge the patient was medically stable, urinating without difficulty, tolerating oral diet, pain well controlled, and was cleared for discharge by PT/OT. Patient will have HHPT. The patient was given strict return precautions, wound care instructions, and activity restrictions and demonstrated clear understanding.       Patient will follow-up in 4 weeks in clinic.    Tests Outstanding at Discharge Requiring Follow Up:  None    Discharge Condition (required):  Stable    Key Physical Exam Findings at Discharge:  AAOx3  Incision healing well, no drainage or erythema, recovered with mepilex dressing.   Motor: 5/5 strength ehl/fhl/ta/gs  Sensory: sensation intact s/s/sp/dp/tib.  Vasc: wwp, BCR    Discharge Diet:  Regular.     Discharge Medications:  Discharge Medication List as of 01/08/2014  3:56 PM      START taking these medications    Details   docusate sodium (COLACE) 250 MG capsule Take 1 capsule by mouth every 12 hours., Disp-40 capsule, R-0, ePrescribe      oxyCODONE (OXYCONTIN) 10 MG Controlled-Release tablet Take 1 tablet  by mouth every 12 hours., Disp-20 tablet, R-0, Handwritten      oxyCODONE (ROXICODONE) 5 MG immediate release tablet Take 1 tablet by mouth every 4 hours as needed for Moderate Pain (Pain Score 4-6) or Severe Pain (Pain Score 7-10)., Disp-100 tablet, R-0, Handwritten      rivaroxaban (XARELTO) 10 MG TABS Take 1 tablet by mouth every 24 hours., Disp-21 tablet, R-0, ePrescribe         CONTINUE these medications which have NOT CHANGED    Details   Acetaminophen (TYLENOL PO) Take 650 mg by mouth 4 times daily as needed., Historical Med      levothyroxine (LEVOXYL) 137 MCG tablet Take  137 mcg by mouth every morning., Historical Med      lisinopril-hydrochlorothiazide (PRINZIDE, ZESTORETIC) 10-12.5 MG per tablet Take 1 tablet by mouth every morning., Historical Med      omeprazole (PRILOSEC) 20 MG capsule Take 20 mg by mouth every morning., Historical Med      simvastatin (ZOCOR) 10 MG tablet Take 10 mg by mouth every evening., Historical Med         STOP taking these medications       amoxicillin (AMOXIL) 500 MG capsule Comments:   Reason for Stopping:               Allergies:  Dermabond [other] and Adhesive tape    Discharge Disposition:  Home.    Discharge Code Status:  Full code / full care  This code status is not changed from the time of admission.    Follow Up Appointments:    Scheduled appointments:  Future Appointments  Date Time Provider [place]   02/06/2014 2:40 PM [name], MD [facility]   05/08/2014 1:20 PM [name], MD [facility] Ortho [facility]       -----------------------------------------------  Ellwood Dense, M.D.  PGY-1 Orthopaedic Surgery  Pager [phone]

## 2014-01-10 ENCOUNTER — Telehealth (INDEPENDENT_AMBULATORY_CARE_PROVIDER_SITE_OTHER): Payer: Self-pay | Admitting: Orthopaedic Surgery

## 2014-01-10 DIAGNOSIS — Z96659 Presence of unspecified artificial knee joint: Principal | ICD-10-CM

## 2014-01-10 NOTE — Telephone Encounter (Signed)
Per Tressa Busman no special wound care needed- routine wound care appropriate. Pt aware. Matter resolved.

## 2014-01-10 NOTE — Telephone Encounter (Addendum)
Pt calling to inform medication XARELTO) 10 MG not cover for more then 7 tabs by insurance.  Patients insurance will not pay for additional  medication until Dr Diona Foley calls insurance.  Peer to peer  Phone [phone] RX [phone]  [facility] -- [address] -- [place] -- [place] -- [place] -- [phone] -- [phone]. Ok to leave msg.

## 2014-01-10 NOTE — Telephone Encounter (Signed)
[facility] PT Order placed and faxed to Jan at [facility]    Discussed pain management with Jan- advised to continue with pain meds as ordered, tylenol PO, increase icing and elevating as much as possible to help with edema.     Discuss wound care- no notes in progress note/d'c notes on wound care - pt was advised by nursing staff to 1) not get wound wet 2) do not change dressing- come back to MD in two weeks to change dressing------again no notes on dressing ([facility] states standard TKA wound care). Will route to dustyn to advise if pt needs special wound care instructions

## 2014-01-10 NOTE — Telephone Encounter (Signed)
Noted, will route to [facility] to call in peer to peer asap

## 2014-01-10 NOTE — Procedures (Signed)
FINAL PATHOLOGIC DIAGNOSIS:  A: Left knee bone, arthroplasty       -Degenerative joint disease.  SPECIMEN(S) SUBMITTED:  A: Left knee bone  CLINICAL HISTORY:  No history given.  GROSS DESCRIPTION:  A: The specimen (received in formalin labeled with the  patient's name, medical record number and "left knee bone") consists of  multiple pieces of soft and osseous tissue aggregating to 9.0 x 9.0 x 5.0  cm.  The identifiable articular cartilage appears pitted tan and granular  and is remarkable for extensive eburnation and osteophyte formation.  The  tibial plateau and femoral condyles are both identified grossly.  Sectioning reveals tan-yellow brittle cut surfaces.  A representative  section of eburnated articular cartilage is submitted in cassette A1 after  decalcification.  MM/kc  CONFIDENTIAL HEALTH INFORMATION: Health Care information is personal and  sensitive information. If it is being faxed to you it is done so under  appropriate authorization from the patient or under circumstances that do  not require patient authorization. You, the recipient, are obligated to  maintain it in a safe, secure and confidential manner. Re-disclosure  without additional patient consent or as permitted by law is prohibited.  Unauthorized re-disclosure or failure to maintain confidentiality could  subject you to penalties described in federal and state law.  If you have  received this report or facsimile in error, please notify the [facility] immediately and destroy the received document(s).    Material reviewed and Interpreted and  Report Electronically Signed by:  Oletha Blend M.D., Ph.D. [phone])  Attending Surgical Pathologist  01/09/14 12:52  Electronic Signature derived from a single  controlled access password

## 2014-01-10 NOTE — Telephone Encounter (Signed)
Pt's Physical Therapist from [facility] is calling that she has questions in regards to pt's dressing and also has questions in regards to pain control. She is requesting another order for more PT for the pt.

## 2014-01-13 ENCOUNTER — Telehealth (INDEPENDENT_AMBULATORY_CARE_PROVIDER_SITE_OTHER): Payer: Self-pay | Admitting: Orthopaedic Surgery

## 2014-01-13 NOTE — Telephone Encounter (Signed)
Per Lupita Leash to stop xarelto and switch to asa 325 mg bid. Advised pt as noted, pt v/u. Pt may call back with any questions or concerns. Matter resolved.

## 2014-01-13 NOTE — Telephone Encounter (Signed)
Angela Calhoun OT from [place], pt had her OT evaluation on Friday 01/10/14, per Angela Calhoun  Pt did very well on her ADL evaluation no more visits for OT.  If any questions please call @ [phone].

## 2014-01-13 NOTE — Telephone Encounter (Signed)
Routed to Dustyn

## 2014-01-16 ENCOUNTER — Telehealth (INDEPENDENT_AMBULATORY_CARE_PROVIDER_SITE_OTHER): Payer: Self-pay | Admitting: Orthopaedic Surgery

## 2014-01-16 NOTE — Telephone Encounter (Signed)
Angela Calhoun is concerned she is experiencing symptoms of a bladder infection. 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 Ayvah Caroll that an RN would call to discuss treatment.

## 2014-01-16 NOTE — Telephone Encounter (Signed)
DOS 01/06/14 LTKA  Pt is urinating frequently, has a squeezing sensation in bladder, no burning, no blood, urine looks cloudy. Pt has hx UTI's- feels similar. Pt called her urologist office and spoke with their RN (Dr. Marlowe Alt, [place], [phone]) and they should be calling back soon. Pt lives in [place] and would need to go to [place] [phone], [phone]) if labs required. Advised to increase fluids and pt is using cranberry pills.

## 2014-01-21 NOTE — Telephone Encounter (Signed)
Pt was able to get abx with urology last week and is f/u with them once abx completed. Pt v/u, may call back with questions/concerns. Matter resolved.

## 2014-01-31 NOTE — Anesthesia Postprocedure Evaluation (Signed)
Anesthesia Transfer of Care Note    Patient: Angela Calhoun    Procedures performed: Procedure(s):  ARTHROPLASTY LT TOTAL KNEE    Vital signs: stable           Anesthesia Post Note    Patient: Angela Calhoun    Procedure(s) Performed: Procedure(s):  ARTHROPLASTY LT TOTAL KNEE      Final anesthesia type: regional, spinal    Patient location: PACU    Post anesthesia pain: adequate analgesia    Post assessment: no apparent anesthetic complications, tolerated procedure well and no evidence of recall    Mental status: awake, alert  and oriented    Airway Patent: Yes    Last Vitals:   Filed Vitals:    01/08/14 0753   BP: 111/65   Pulse: 65   Temp: 36.6 C   Resp: 16   SpO2: 96%       Post vital signs: stable    Hydration: adequate    N/V:no    Anesthetic complications: no    Disposal of controlled substances: All controlled substances during the case accounted for and disposed of per hospital policy    Plan of care per primary team.

## 2014-02-04 ENCOUNTER — Encounter (INDEPENDENT_AMBULATORY_CARE_PROVIDER_SITE_OTHER): Payer: Self-pay | Admitting: Orthopaedic Surgery

## 2014-02-04 DIAGNOSIS — Z96659 Presence of unspecified artificial knee joint: Principal | ICD-10-CM

## 2014-02-06 ENCOUNTER — Ambulatory Visit (INDEPENDENT_AMBULATORY_CARE_PROVIDER_SITE_OTHER): Payer: Medicare Other | Admitting: Orthopaedic Surgery

## 2014-02-06 ENCOUNTER — Inpatient Hospital Stay (INDEPENDENT_AMBULATORY_CARE_PROVIDER_SITE_OTHER): Admit: 2014-02-06 | Discharge: 2014-02-06 | Disposition: A | Discharge status: Home / Self Care | Payer: Medicare Other

## 2014-02-06 VITALS — BP 120/75 | Temp 98.3°F | Ht 67.0 in | Wt 245.0 lb

## 2014-02-06 DIAGNOSIS — M25569 Pain in unspecified knee: Secondary | ICD-10-CM

## 2014-02-06 DIAGNOSIS — M1710 Unilateral primary osteoarthritis, unspecified knee: Secondary | ICD-10-CM

## 2014-02-06 DIAGNOSIS — Z96659 Presence of unspecified artificial knee joint: Secondary | ICD-10-CM

## 2014-02-06 MED ORDER — HYDROCODONE-ACETAMINOPHEN 5-325 MG OR TABS
1.0000 | ORAL_TABLET | Freq: Four times a day (QID) | ORAL | Status: DC | PRN
Start: 2014-02-06 — End: 2015-12-22

## 2014-02-06 NOTE — Progress Notes (Signed)
History of Present Illness:     Angela Calhoun is a 68 year old female who is here for recheck now 4 weeks following left total knee arthroplasty.  Patient is doing well.  The patient is progressing well with physical therapy.    No reports of fevers, chills or night sweats.  No problems with the incision.    Past Medical History   Diagnosis Date    Dyslipidemia     HTN (hypertension)     GERD (gastroesophageal reflux disease)     Hypothyroidism     Osteoarthritis of hip      Left and s/p R THR    Arthritis of knee      Left > Right    Left Kidney carcinoma 08/21/2013 sx     s/p Left nephrectomy 08/21/2013: no CHEMO/XRT     Past Surgical History   Procedure Laterality Date    Cholecystectomy, lap  2008    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      Pb remove tonsils/adenoids,<13 y/o      Pb biopsy of breast, incisional  1990's    Knee i&d  1980's     Traumatic Injury to Knee     Pb laparoscopy radical nephrectomy Left 08/21/2013     Cancer of Left kidney    Colonoscopy      S/p right thr  2011     GA per pt report with REG     Allergies   Allergen Reactions    Dermabond [Other] Rash    Adhesive Tape Rash     Current Outpatient Prescriptions   Medication Sig    Acetaminophen (TYLENOL PO) Take 650 mg by mouth 4 times daily as needed.    docusate sodium (COLACE) 250 MG capsule Take 1 capsule by mouth every 12 hours.    HYDROcodone-acetaminophen (NORCO) 5-325 MG tablet Take 1 tablet by mouth every 6 hours as needed for Moderate Pain (Pain Score 4-6).    levothyroxine (LEVOXYL) 137 MCG tablet Take 137 mcg by mouth every morning.    lisinopril-hydrochlorothiazide (PRINZIDE, ZESTORETIC) 10-12.5 MG per tablet Take 1 tablet by mouth every morning.    omeprazole (PRILOSEC) 20 MG capsule Take 20 mg by mouth every morning.    oxyCODONE (OXYCONTIN) 10 MG Controlled-Release tablet Take 1 tablet by mouth every 12 hours.    oxyCODONE (ROXICODONE) 5 MG immediate release tablet Take  1 tablet by mouth every 4 hours as needed for Moderate Pain (Pain Score 4-6) or Severe Pain (Pain Score 7-10).    rivaroxaban (XARELTO) 10 MG TABS Take 1 tablet by mouth every 24 hours.    simvastatin (ZOCOR) 10 MG tablet Take 10 mg by mouth every evening.     No current facility-administered medications for this visit.       Physical Exam:  BP 120/75    Temp(Src) 98.3 F (36.8 C) (Oral)    Ht 5\' 7"  (1.702 m)    Wt 111.131 kg (245 lb)    BMI 38.36 kg/m2     General Appearance: healthy, alert, no distress, pleasant affect, cooperative.  Mental Status: Appearance/Cooperation: in no apparent distress and oriented times 3. Extremities:  no cyanosis, clubbing. trace edema.  Incision is well healed.  There are no signs of infection.  Alignment is anatomic.  Range of motion is from 3 to 100.  Knee is stable to ligamentous testing.    Imaging:  Radiographs of the left knee demonstrate  cemented total knee arthroplasty in excellent alignment without evidence of fracture or failure.     Assessment:  Status post left total knee arthroplasty     Plan:  We have discussed post-operative instructions.  Directions related to a home exercise program were given.  Wound care instructions were discussed.  We will transition to out patient physical therapy.  Discussed pain control.  Continue to weight bear as tolerated.  Plan on seeing the patient back in 3 months unless there are any problems or concerns sooner.       Orders Placed This Encounter   Procedures    Physical Therapy - Outside (Non-[facility])

## 2014-02-18 ENCOUNTER — Telehealth (INDEPENDENT_AMBULATORY_CARE_PROVIDER_SITE_OTHER): Payer: Self-pay | Admitting: Orthopaedic Surgery

## 2014-02-18 DIAGNOSIS — I998 Other disorder of circulatory system: Secondary | ICD-10-CM

## 2014-02-18 DIAGNOSIS — Z96659 Presence of unspecified artificial knee joint: Principal | ICD-10-CM

## 2014-02-18 NOTE — Telephone Encounter (Signed)
Carmela from [facility] called with concerns states, " Patient came in today with increase soreness and extra red on her left knee, she had left TKA on 01/09/14. She states she has pain on her calf and when I performed Homan's test she was in a lot of pain. " Please advice.

## 2014-02-18 NOTE — Telephone Encounter (Signed)
PT advised most swelling/redness to anterior distal portion of knee, calf is very sore/tender to touch- pt's PT advised positive Homans and she needs to have test for DVT. I advised pt I saw original message and routed to provider for STAT US orders however no answer yet, advised pt to come to ED to r/o DVT and assess knee redness/swelling (r/o s/s of infection). Pt vu- will go to nearest ED as she lives in [place] and will call us with findings.

## 2014-02-18 NOTE — Telephone Encounter (Signed)
Pt requesting to speak to RN, pt states her physical therapist thinks she has blood cloth. Pt states her incision is red, swollen and painful. Pls call and advise.

## 2014-02-19 ENCOUNTER — Encounter (INDEPENDENT_AMBULATORY_CARE_PROVIDER_SITE_OTHER): Payer: Self-pay | Admitting: Orthopaedic Surgery

## 2014-02-19 ENCOUNTER — Telehealth (INDEPENDENT_AMBULATORY_CARE_PROVIDER_SITE_OTHER): Payer: Self-pay | Admitting: Orthopaedic Surgery

## 2014-02-19 ENCOUNTER — Other Ambulatory Visit: Payer: Medicare Other | Attending: Physician Assistant | Admitting: Orthopaedic Surgery

## 2014-02-19 VITALS — BP 103/70 | HR 92 | Temp 98.0°F

## 2014-02-19 DIAGNOSIS — Z09 Encounter for follow-up examination after completed treatment for conditions other than malignant neoplasm: Secondary | ICD-10-CM

## 2014-02-19 DIAGNOSIS — M25569 Pain in unspecified knee: Secondary | ICD-10-CM | POA: Insufficient documentation

## 2014-02-19 DIAGNOSIS — M1710 Unilateral primary osteoarthritis, unspecified knee: Secondary | ICD-10-CM | POA: Insufficient documentation

## 2014-02-19 LAB — BODY FLUID CELL CT / DIFF
Fluid RBC mm3: 59000 uL
Fluid WBC mm3: 773 uL
Lymphocytes fluid %: 34 %
Macrophages fluid %: 24 %
Neutrophils Fluid %: 42 %

## 2014-02-19 MED ORDER — CEPHALEXIN 500 MG OR CAPS
500.0000 mg | ORAL_CAPSULE | Freq: Four times a day (QID) | ORAL | Status: DC
Start: 2014-02-19 — End: 2015-12-22

## 2014-02-19 NOTE — Telephone Encounter (Signed)
PT went to ED yesterday, xrays- WNL, [place]- negative for DVT, pt was diagnoses with cephalexin 500 mg QID for 7 days, marked were redness was and there is already a decrease in redness. Soreness to knee is much improved. Pt will follow up in office today. Pt confirmed appt date/time/details as scheduled, matter resolved.

## 2014-02-19 NOTE — Progress Notes (Signed)
History of Present Illness:     Angela Calhoun is a 68 year old female who is here for recheck now 6 weeks following right total knee arthroplasty.  She was in the ER yesterday after her therapist noted increased swelling and redness around her knee.  She had an ultrasound that was negative for DVT and she was started on Keflex.    Her symptoms have improved significantly since yesterday.  No reports of fevers, chills or night sweats.  No problems with the incision but she did have a large area of redness around the anterior knee.    Past Medical History   Diagnosis Date    Dyslipidemia     HTN (hypertension)     GERD (gastroesophageal reflux disease)     Hypothyroidism     Osteoarthritis of hip      Left and s/p R THR    Arthritis of knee      Left > Right    Left Kidney carcinoma 08/21/2013 sx     s/p Left nephrectomy 08/21/2013: no CHEMO/XRT     Past Surgical History   Procedure Laterality Date    Cholecystectomy, lap  2008    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      Pb remove tonsils/adenoids,<13 y/o      Pb biopsy of breast, incisional  1990's    Knee i&d  1980's     Traumatic Injury to Knee     Pb laparoscopy radical nephrectomy Left 08/21/2013     Cancer of Left kidney    Colonoscopy      S/p right thr  2011     GA per pt report with REG     Allergies   Allergen Reactions    Dermabond [Other] Rash    Adhesive Tape Rash     Current Outpatient Prescriptions   Medication Sig    Acetaminophen (TYLENOL PO) Take 650 mg by mouth 4 times daily as needed.    docusate sodium (COLACE) 250 MG capsule Take 1 capsule by mouth every 12 hours.    HYDROcodone-acetaminophen (NORCO) 5-325 MG tablet Take 1 tablet by mouth every 6 hours as needed for Moderate Pain (Pain Score 4-6).    levothyroxine (LEVOXYL) 137 MCG tablet Take 137 mcg by mouth every morning.    lisinopril-hydrochlorothiazide (PRINZIDE, ZESTORETIC) 10-12.5 MG per tablet Take 1 tablet by mouth every morning.     omeprazole (PRILOSEC) 20 MG capsule Take 20 mg by mouth every morning.    oxyCODONE (OXYCONTIN) 10 MG Controlled-Release tablet Take 1 tablet by mouth every 12 hours.    oxyCODONE (ROXICODONE) 5 MG immediate release tablet Take 1 tablet by mouth every 4 hours as needed for Moderate Pain (Pain Score 4-6) or Severe Pain (Pain Score 7-10).    rivaroxaban (XARELTO) 10 MG TABS Take 1 tablet by mouth every 24 hours.    simvastatin (ZOCOR) 10 MG tablet Take 10 mg by mouth every evening.     No current facility-administered medications for this visit.       Physical Exam:  BP 103/70    Pulse 92    Temp(Src) 98 F (36.7 C) (Oral)   General Appearance: healthy, alert, no distress, pleasant affect, cooperative.  Mental Status: Appearance/Cooperation: in no apparent distress and oriented times 3. Extremities:  no cyanosis, clubbing. 1+ edema.  Incision is well healed.  Mild effusion. She does have slight discoloration anteriorely.   Alignment is anatomic.  Range  of motion is from 2 to 115.  Knee is stable to ligamentous testing.    Imaging:  None    Assessment:  Status post right total knee arthroplasty     Plan:  We have discussed post-operative instructions.  At this point we have recommended a knee aspiration to evaluate for deep infection.  She has improved with the keflex and was very painful prior to starting the antibiotics.  After informed consent and under sterile conditions the left knee was aspirated of 20cc of bloody synovial fluid.  We will send this for cell count and culture.  We will contact her with results.  Directions related to a home exercise program were given.  Wound care instructions were discussed.    Discussed pain control.  Continue to weight bear as tolerated.      No orders of the defined types were placed in this encounter.

## 2014-02-19 NOTE — Telephone Encounter (Signed)
Pt is requesting RN call back from [facility]. Pt states she went to ER yesterday and wanted to discuss the findings. Pls advise

## 2014-02-24 LAB — FLUID CULTURE IN BLOOD BOTTLES: Fluid Culture Result: NO GROWTH

## 2014-02-24 LAB — STERILE FLUID CULTURE W/GRAM STAIN, AEROBIC: FLUID CULTURE RESULT: NO GROWTH

## 2014-02-26 ENCOUNTER — Encounter (INDEPENDENT_AMBULATORY_CARE_PROVIDER_SITE_OTHER): Payer: Self-pay | Admitting: Physician Assistant

## 2014-02-26 LAB — ANAEROBIC CULTURE

## 2014-02-26 NOTE — Telephone Encounter (Signed)
From: [name]  To: [name], [place]  Sent: 02/26/2014 8:06 AM PDT  Subject: 1-Non Urgent Medical Advice    I received a final test result as of 3/25 on my Anaerobic Culture Result. I think it confirms that I don't have an infection in my knee. Just want to be sure I'm understanding the test result information correctly. I finished taking the antibiotic and my knee is feeling much better. I started going back to physical therapy this week. Thank you, Brendia Sacks

## 2014-02-26 NOTE — Telephone Encounter (Signed)
Routed to Dustyn

## 2014-03-13 ENCOUNTER — Encounter (INDEPENDENT_AMBULATORY_CARE_PROVIDER_SITE_OTHER): Payer: Self-pay | Admitting: Physician Assistant

## 2014-03-14 NOTE — Telephone Encounter (Signed)
From: [name]  To: [name], [place]  Sent: 03/13/2014 4:15 PM PDT  Subject: 1-Non Urgent Medical Advice    Wanted to check in with you before having a procedure scheduled through my kidney surgeon. I am scheduled for a CT scan with contrast on April 30 as a followup for my right kidney. Do you feel I can go ahead and have this procedure done given the amount of time since my knee replacement on Feb. 2. Thank you, Angela Calhoun

## 2014-03-14 NOTE — Telephone Encounter (Signed)
Routed to Dustyn to advise

## 2014-04-05 NOTE — Progress Notes (Signed)
ORTHO STAFF ADDENDUM:  See Gertie Baron note for details.  I saw and evaluated the patient.  I have discussed the patient with [facility] and agree with his findings and the plan we developed as written.    Angela Calhoun comes in for eval of redness after TKR.  Was sent to the ED.  See Dava Najjar note for details.    In order to definitively r/o infection, recommended aspiration today.  Under sterile conditions, I aspirated the knee.  Sent for Cell count and culture.  We will f/u on those results.

## 2014-05-05 ENCOUNTER — Encounter (INDEPENDENT_AMBULATORY_CARE_PROVIDER_SITE_OTHER): Payer: Self-pay | Admitting: Physician Assistant

## 2014-05-05 DIAGNOSIS — Z96659 Presence of unspecified artificial knee joint: Principal | ICD-10-CM

## 2014-05-08 ENCOUNTER — Inpatient Hospital Stay (INDEPENDENT_AMBULATORY_CARE_PROVIDER_SITE_OTHER): Admit: 2014-05-08 | Discharge: 2014-05-08 | Disposition: A | Discharge status: Home / Self Care | Payer: Medicare Other

## 2014-05-08 ENCOUNTER — Ambulatory Visit (INDEPENDENT_AMBULATORY_CARE_PROVIDER_SITE_OTHER): Payer: Medicare Other | Admitting: Physician Assistant

## 2014-05-08 ENCOUNTER — Encounter (INDEPENDENT_AMBULATORY_CARE_PROVIDER_SITE_OTHER): Payer: Medicare Other | Admitting: Orthopaedic Surgery

## 2014-05-08 VITALS — BP 114/74 | HR 79 | Temp 97.2°F | Ht 67.0 in | Wt 240.0 lb

## 2014-05-08 DIAGNOSIS — Z96649 Presence of unspecified artificial hip joint: Secondary | ICD-10-CM

## 2014-05-08 DIAGNOSIS — Z96659 Presence of unspecified artificial knee joint: Secondary | ICD-10-CM

## 2014-05-08 DIAGNOSIS — M25569 Pain in unspecified knee: Secondary | ICD-10-CM

## 2014-05-08 DIAGNOSIS — M25561 Pain in right knee: Secondary | ICD-10-CM

## 2014-05-08 DIAGNOSIS — M25559 Pain in unspecified hip: Secondary | ICD-10-CM

## 2014-05-08 DIAGNOSIS — M25551 Pain in right hip: Secondary | ICD-10-CM

## 2014-05-08 NOTE — Progress Notes (Signed)
History of Present Illness:     Angela Calhoun is a 68 year old female who is here for recheck now 4 months following left total knee arthroplasty.  Patient is doing well with her knee.  The patient is progressing well and is back to most normal activities.  No reports of fevers, chills or night sweats.  No problems with the incision.  She has done great but has had some mild posterior right hip pain.     Past Medical History   Diagnosis Date    Dyslipidemia     HTN (hypertension)     GERD (gastroesophageal reflux disease)     Hypothyroidism     Osteoarthritis of hip      Left and s/p R THR    Arthritis of knee      Left > Right    Left Kidney carcinoma 08/21/2013 sx     s/p Left nephrectomy 08/21/2013: no CHEMO/XRT     Past Surgical History   Procedure Laterality Date    Cholecystectomy, lap  2008    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      Pb remove tonsils/adenoids,<13 y/o      Pb biopsy of breast, incisional  1990's    Knee i&d  1980's     Traumatic Injury to Knee     Pb laparoscopy radical nephrectomy Left 08/21/2013     Cancer of Left kidney    Colonoscopy      S/p right thr  2011     GA per pt report with [facility]     Allergies   Allergen Reactions    Dermabond [Other] Rash    Adhesive Tape Rash     Current Outpatient Prescriptions   Medication Sig    Acetaminophen (TYLENOL PO) Take 650 mg by mouth 4 times daily as needed.    cephALEXin (KEFLEX) 500 MG capsule Take 1 capsule by mouth 4 times daily.    docusate sodium (COLACE) 250 MG capsule Take 1 capsule by mouth every 12 hours.    HYDROcodone-acetaminophen (NORCO) 5-325 MG tablet Take 1 tablet by mouth every 6 hours as needed for Moderate Pain (Pain Score 4-6).    levothyroxine (LEVOXYL) 137 MCG tablet Take 137 mcg by mouth every morning.    lisinopril-hydrochlorothiazide (PRINZIDE, ZESTORETIC) 10-12.5 MG per tablet Take 1 tablet by mouth every morning.    omeprazole (PRILOSEC) 20 MG capsule Take 20 mg by  mouth every morning.    oxyCODONE (OXYCONTIN) 10 MG Controlled-Release tablet Take 1 tablet by mouth every 12 hours.    oxyCODONE (ROXICODONE) 5 MG immediate release tablet Take 1 tablet by mouth every 4 hours as needed for Moderate Pain (Pain Score 4-6) or Severe Pain (Pain Score 7-10).    rivaroxaban (XARELTO) 10 MG TABS Take 1 tablet by mouth every 24 hours.    simvastatin (ZOCOR) 10 MG tablet Take 10 mg by mouth every evening.     No current facility-administered medications for this visit.       Physical Exam:  BP 114/74   Pulse 79   Temp(Src) 97.2 F (36.2 C) (Oral)   Ht 5\' 7"  (1.702 m)   Wt 108.863 kg (240 lb)   BMI 37.58 kg/m2  General Appearance: healthy, alert, no distress, pleasant affect, cooperative.  Mental Status: Appearance/Cooperation: in no apparent distress and oriented times 3. Extremities:  no cyanosis, clubbing. No edema.  Incision is well healed.  There are no signs  of infection.  Alignment is anatomic.  Range of motion is from 0 to 120.  Knee is stable to ligamentous testing.  Hip motion is full and pain free.  Negative SLR.      Imaging:  Radiographs of the left knee demonstrate cemented total knee arthroplasty in excellent alignment without evidence of fracture or failure. Her right hip shows a cementless total hip arthroplasty in excellent position.     Assessment:  Status post left total knee arthroplasty.  S/P right total hip.      Plan:  We have discussed post-operative instructions.  The patient may remain as active as tolerated.  Directions related to appropriate activities and recommendations for exercise have been discussed.    Antibiotics for dental prophylaxis have been discussed and we will call this in today.  Her hip appears stable. Her symptoms may be related to her spine but she is not interested in any formal management or intervention now.   Plan on seeing the patient back at the one year anniversary unless there are any problems or concerns sooner.       Orders Placed  This Encounter   Procedures    X-Ray Hip Unilat Compl Min 2 Views - Right

## 2015-07-31 ENCOUNTER — Encounter (INDEPENDENT_AMBULATORY_CARE_PROVIDER_SITE_OTHER): Payer: Self-pay | Admitting: Orthopaedic Surgery

## 2015-07-31 ENCOUNTER — Inpatient Hospital Stay (INDEPENDENT_AMBULATORY_CARE_PROVIDER_SITE_OTHER): Admit: 2015-07-31 | Discharge: 2015-07-31 | Disposition: A | Discharge status: Home / Self Care | Payer: Medicare Other

## 2015-07-31 ENCOUNTER — Ambulatory Visit (INDEPENDENT_AMBULATORY_CARE_PROVIDER_SITE_OTHER): Payer: Medicare Other | Admitting: Orthopaedic Surgery

## 2015-07-31 VITALS — BP 127/78 | HR 69 | Ht 67.0 in | Wt 262.0 lb

## 2015-07-31 DIAGNOSIS — M1990 Unspecified osteoarthritis, unspecified site: Secondary | ICD-10-CM

## 2015-07-31 DIAGNOSIS — M25552 Pain in left hip: Principal | ICD-10-CM

## 2015-07-31 DIAGNOSIS — M1612 Unilateral primary osteoarthritis, left hip: Secondary | ICD-10-CM

## 2015-07-31 NOTE — Progress Notes (Signed)
Follow Up - Hip    Chief complaint: Left hip pain.    History of Present Illness:   Angela Calhoun is a 69 year old female presenting with chronic left hip pain requesting a THA. She has previously undergone a R THA (03/2010) and L TKA (01/2014) by Dr. Diona Foley. The patient has a long history of osteoarthritis of the left hip. She states that her pain has been progressively worsening. The pain is worse with activity and improves with rest. She now has trouble with ADLs and cannot walk more than a few blocks before having to rest secondary to pain. She takes tylenol with moderate pain relief. Also complaining of a long history of right knee pain. Her knee is starting to feel unstable. Wants to get her left THA before she considers management of her right knee. Has a history of renal carcinoma with left nephrectomy in 2014, prior to her last TKA. States her follow-ups have shown no recurrence of the cancer.    Past Medical History:  has a past medical history of Arthritis of knee; Dyslipidemia; GERD (gastroesophageal reflux disease); HTN (hypertension); Hypothyroidism; Left Kidney carcinoma (08/21/2013 sx); and Osteoarthritis of hip.     Past Surgical History:  has a past surgical history that includes cholecystectomy, lap (2008); Thyroidectomy (1995); Remove Tonsils/Adenoids,<13 Y/O; Biopsy Of Breast, Incisional (1990's); Knee I&D (1980's); Lap, Radical Nephrectomy (Left, 08/21/2013); Colonoscopy; and s/p Right THR (2011).     Allergies: is allergic to dermabond [other] and adhesive tape.     Review of Systems:   General:  Negative for fevers, chills or night sweats.  Skin: Negative for rashes, sores or infection.  Eyes: Negative for visual changes, diplopia or blurry vision.  Ears/Nose/Throat/Mouth: Negative for dental infection or problems.  Negative for sore throat or congestion.   Respiratory: Negative for cough, sputum production, wheezing, sleep apnea.  Cardiovascular: Negative for chest pain, palpitations,  syncope, orthopnea or pnd.  Gastrointestinal: Negative for nausea, vomiting, diarrhea, melena or hematochezia.    Genitourinary: Negative for dysuria, frequency, hesitancy or nocturia.  Musculoskeletal: Significant for joint pain.  Neurologic: Negative for history of seizures, numbness, tingling or weakness.  Psychiatric: negative  Hematologic/Lymphatic/Immunologic: Negative for anemia or bleeding problems. Negative for DVT or PE history.  Endocrine: negative      Physical Examination: General Appearance: healthy, alert, no distress, pleasant affect, cooperative.    Vitals: BP 127/78  Pulse 69  Ht 5\' 7"  (1.702 m)  Wt 118.8 kg (262 lb)  BMI 41.04 kg/m2    Patient walks with a/n nonantalgic gait and has pain climbing onto the exam table.    Hips are tender to palpation today.  Range of motion:  Left Hip: Flexion 110, Abduction 30, Adduction 5, Internal Rotation 0, External Rotation 15  Positive Stinchfield and Impingement Signs  Right Knee:  Flexion 120, Extension 0  Stable to ligamentous testing.  Crepitus with flexion.  Distal pulses are 2+ and regular.    Sensation to light touch testing is grossly intact in the lower extremities and strength is well maintained.        Imaging: Radiographs of the left hip, including AP pelvis, demonstrate moderate to severe osteoarthritis.    Impression: Angela Calhoun is a 69 year old female with left hip osteoarthritis that is amenable to surgical treatment.     Plan:  We have discussed clinical and radiographic findings with the patient today.  We have discussed conservative treatment which would include activity modifications, weight reduction,  NSAIDs and rest.  We have also discussed injections as a treatment option.  Surgical options have also been presented to the patient.    We discussed the risks, benefits, and alternatives to total hip arthroplasty with risks including but not being limited to chronic pain, bleeding, infection, implant failure, need for further  procedures including resection/revision, fracture, dislocation, leg length discrepancy, damage to adjacent structures including blood vessels and nerves with associated numbness and weakness of the extremity, DVT/PE and anesthesia risks including heart attack, stroke, coma, and death. We also discussed the surgery itself and the expected postoperative course. Patient's pain is significant and patient's condition has impacted quality of life to such a degree that the patient accepts the risks and wishes to proceed with total joint arthroplasty of the hip.  A surgery schedule was filled out in clinic today. Consent was obtained in clinic in the presence of a witness as well. The patient was provided the surgery schedulers contact information, whom patient will coordinate a date for surgery. All questions were answered.  - Patient advised to enter into a weight loss program before the surgery, agrees and is amenable.  - Medical clearance for surgery by PCP required before surgery.

## 2015-07-31 NOTE — Patient Instructions (Signed)
Joint Replacement Surgery Scheduling      You can start the scheduling process by contacting my surgery scheduler, Teri Lopez:  Phone:  [phone]  Fax: [phone]    After reviewing your medical records it appears that you will require preoperative medical clearance by your PCP.    Please arrange a visit with your medical care provider and have any records or medical clearance documentation faxed to my surgery scheduler.    Once you have been formally cleared by your medical care provider we can provide you with a date for surgery.  Our surgery scheduler will provide this date for you and will help you coordinate preoperative visits.      Preoperative Appointments  Many patients require an office visit with our anesthesia department in the preoperative care center. This visit typically occurs 1-2 weeks prior to surgery.     During this visit your past medical history and medications will be reviewed.  Laboratory testing will be performed at this visit and occasionally additional x-rays or ECG will be needed.     These visits will be scheduled and coordinated by our surgery scheduler once you have a surgery date.      Preoperative Education  You will be offered the opportunity to attend a preoperative education class and review an online educational program related to your joint replacement surgery. This will be arranged and offered by our surgery scheduler.             [facility], [facility] [place]  [address]   [place], [place] [address]  [phone]    [facility]  [address]  [place] [address]  [phone]    SLEEP APNEA QUESTIONNAIRE  ‘STOP BANG’    Sleep apnea has been associated with an increased risk of breathing complications after surgery in patients requiring general anesthesia.  These risks can be decreased with appropriate care and monitoring after surgery.  This questionnaire is an important tool for your healthcare team to  determine your level of risk so that we can provide you with the optimal level of care.      1. Snoring   Do you snore loudly (louder than talking or loud enough to be heard   through closed doors)?   Yes  No     2. Tired   Do you often feel tired, fatigued, or sleepy during daytime?   Yes  No     3. Observed   Has anyone observed you stop breathing during your sleep?   Yes  No     4. Blood pressure   Do you have or are you being treated for high blood pressure?   Yes  No      5. BMI   BMI more than 35 kg/m2?   Yes  No     6. Age   Age over 50 yr old?   Yes  No     7. Neck circumference   Neck circumference greater than 40 cm (16 inches)?   Yes  No     8. Gender   Gender female?   Yes  No     * Neck circumference is measured by staff   High risk of OSA: answering yes to three or more items   Low risk of OSA: answering yes to less than three items

## 2015-07-31 NOTE — Progress Notes (Signed)
Agree with MS4 note.    Ms. Warmack returns with LEFT hip pain due to OA.  This has progressed and now she is ready for LEFT THA.  Her right hip has done very well and her Left knee has done well.     07/31/15  1041   BP: 127/78   Pulse: 69       5.7" // 260 lbs.  BMI 41.    ROM limited and painful.    xrays show severe LEFT hip OA.    Plan - LEFT HIP THA.  We discussed risks/benefits/rehab/expected outcomes of surgery today.  I answered all of the patient's questions.  We generated a worksheet today and gave her Teri's card for scheduling purposes.  Recommended that she try to lose some weight prior to surgery.  She wants to have surg in January   Today's visit and discussion was greater than 25 minutes in length.    ----------------------------------------------    Chief complaint: Left hip pain.    History of Present Illness:   Angela Calhoun is a 69 year old female presenting with chronic left hip pain requesting a THA. She has previously undergone a R THA (03/2010) and L TKA (01/2014) by Dr. Diona Foley. The patient has a long history of osteoarthritis of the left hip. She states that her pain has been progressively worsening. The pain is worse with activity and improves with rest. She now has trouble with ADLs and cannot walk more than a few blocks before having to rest secondary to pain. She takes tylenol with moderate pain relief. Also complaining of a long history of right knee pain. Her knee is starting to feel unstable. Wants to get her left THA before she considers management of her right knee. Has a history of renal carcinoma with left nephrectomy in 2014, prior to her last TKA. States her follow-ups have shown no recurrence of the cancer.    Past Medical History:  has a past medical history of Arthritis of knee; Dyslipidemia; GERD (gastroesophageal reflux disease); HTN (hypertension); Hypothyroidism; Left Kidney carcinoma (08/21/2013 sx); and Osteoarthritis of hip.     Past Surgical History:  has a  past surgical history that includes cholecystectomy, lap (2008); Thyroidectomy (1995); Remove Tonsils/Adenoids,<13 Y/O; Biopsy Of Breast, Incisional (1990's); Knee I&D (1980's); Lap, Radical Nephrectomy (Left, 08/21/2013); Colonoscopy; and s/p Right THR (2011).     Allergies: is allergic to dermabond [other] and adhesive tape.     Review of Systems:   General: Negative for fevers, chills or night sweats.  Skin: Negative for rashes, sores or infection.  Eyes: Negative for visual changes, diplopia or blurry vision.  Ears/Nose/Throat/Mouth: Negative for dental infection or problems. Negative for sore throat or congestion.   Respiratory: Negative for cough, sputum production, wheezing, sleep apnea.  Cardiovascular: Negative for chest pain, palpitations, syncope, orthopnea or pnd.  Gastrointestinal: Negative for nausea, vomiting, diarrhea, melena or hematochezia.   Genitourinary: Negative for dysuria, frequency, hesitancy or nocturia.  Musculoskeletal: Significant for joint pain.  Neurologic: Negative for history of seizures, numbness, tingling or weakness.  Psychiatric: negative  Hematologic/Lymphatic/Immunologic: Negative for anemia or bleeding problems. Negative for DVT or PE history.  Endocrine: negative      Physical Examination: General Appearance: healthy, alert, no distress, pleasant affect, cooperative.    Vitals:    BP 127/78 Pulse 69 Ht 5\' 7"  (1.702 m) Wt 118.8 kg (262 lb) BMI 41.04 kg/m2           Patient walks with a/n  nonantalgic gait and has pain climbing onto the exam table.   Hips are tender to palpation today.  Range of motion:  Left Hip: Flexion 110, Abduction 30, Adduction 5, Internal Rotation 0, External Rotation 15  Positive Stinchfield and Impingement Signs  Right Knee:  Flexion 120, Extension 0  Stable to ligamentous testing.  Crepitus with flexion.  Distal pulses are 2+ and regular.   Sensation to light touch testing is grossly intact in the lower extremities and strength is well  maintained.      Imaging: Radiographs of the left hip, including AP pelvis, demonstrate moderate to severe osteoarthritis.    Impression: Angela Calhoun is a 69 year old female with left hip osteoarthritis that is amenable to surgical treatment.    Plan:  We have discussed clinical and radiographic findings with the patient today. We have discussed conservative treatment which would include activity modifications, weight reduction, NSAIDs and rest. We have also discussed injections as a treatment option. Surgical options have also been presented to the patient.   We discussed the risks, benefits, and alternatives to total hip arthroplasty with risks including but not being limited to chronic pain, bleeding, infection, implant failure, need for further procedures including resection/revision, fracture, dislocation, leg length discrepancy, damage to adjacent structures including blood vessels and nerves with associated numbness and weakness of the extremity, DVT/PE and anesthesia risks including heart attack, stroke, coma, and death. We also discussed the surgery itself and the expected postoperative course. Patient's pain is significant and patient's condition has impacted quality of life to such a degree that the patient accepts the risks and wishes to proceed with total joint arthroplasty of the hip. A surgery schedule was filled out in clinic today. Consent was obtained in clinic in the presence of a witness as well. The patient was provided the surgery schedulers contact information, whom patient will coordinate a date for surgery. All questions were answered.  - Patient advised to enter into a weight loss program before the surgery, agrees and is amenable.  - Medical clearance for surgery by PCP required before surgery.

## 2015-08-13 ENCOUNTER — Encounter (INDEPENDENT_AMBULATORY_CARE_PROVIDER_SITE_OTHER): Payer: Self-pay | Admitting: Orthopaedic Surgery

## 2015-08-13 ENCOUNTER — Telehealth (INDEPENDENT_AMBULATORY_CARE_PROVIDER_SITE_OTHER): Payer: Self-pay | Admitting: Orthopaedic Surgery

## 2015-08-13 NOTE — Telephone Encounter (Signed)
9/8 late entry s/w pt - Requesting surgery after Jan 16th,2017 2nd joint replacement with Angela Calhoun. Advised Med Clearance prior to scheduling surgery - pcp:Angela Calhoun [phone] [phone] [facility] faxed & emailed copy to pt. States per Angela Calhoun instructed her to "try and lose weight no official weight check was indicated."  (procedure:L tha)

## 2015-08-28 ENCOUNTER — Telehealth (INDEPENDENT_AMBULATORY_CARE_PROVIDER_SITE_OTHER): Payer: Self-pay | Admitting: Orthopaedic Surgery

## 2015-08-28 NOTE — Telephone Encounter (Signed)
recieved [facility] pcp:Uma Devaki please review. (procedure:L tha)

## 2015-08-28 NOTE — Telephone Encounter (Signed)
Cleared for surg

## 2015-09-03 ENCOUNTER — Encounter (INDEPENDENT_AMBULATORY_CARE_PROVIDER_SITE_OTHER): Payer: Medicare Other | Admitting: Orthopaedic Surgery

## 2015-09-04 NOTE — Telephone Encounter (Signed)
S/w pt - Request surgery after Jan 16 prefers a Friday. Th DOS:1/20 Pre/post op appts scheduled. (procedure:L tha)

## 2015-12-08 ENCOUNTER — Encounter (INDEPENDENT_AMBULATORY_CARE_PROVIDER_SITE_OTHER): Payer: Self-pay | Admitting: Orthopaedic Surgery

## 2015-12-09 NOTE — Telephone Encounter (Signed)
From: [name]  To: [name], MD  Sent: 12/08/2015 8:48 AM PST  Subject: 20-Other    I have an appointment on Friday, Jan. 6, to meet with an anesthesiologist prior to my hip replacement surgery on Jan. 19. I had previously requested that Princess Bruins to do my spinal so will I be meeting with her or someone else? Thank you, Brendia Sacks

## 2015-12-10 ENCOUNTER — Other Ambulatory Visit (INDEPENDENT_AMBULATORY_CARE_PROVIDER_SITE_OTHER): Payer: Self-pay | Admitting: Orthopaedic Surgery

## 2015-12-11 ENCOUNTER — Encounter (INDEPENDENT_AMBULATORY_CARE_PROVIDER_SITE_OTHER): Payer: Self-pay | Admitting: Orthopaedic Surgery

## 2015-12-11 ENCOUNTER — Encounter (INDEPENDENT_AMBULATORY_CARE_PROVIDER_SITE_OTHER): Payer: Medicare Other

## 2015-12-14 ENCOUNTER — Encounter (INDEPENDENT_AMBULATORY_CARE_PROVIDER_SITE_OTHER): Payer: Self-pay | Admitting: Orthopaedic Surgery

## 2015-12-14 NOTE — Telephone Encounter (Signed)
From: [name]  To: [name], MD  Sent: 12/11/2015 1:57 PM PST  Subject: 20-Other    As of Jan.- 5th, I was diagnosed with a UTI. I am under the care from my Urologist, Dr. Samara Deist who is with [facility] in [place]. She had prescribed a Sulfa antibiotic which I will start taking today. SMZ/TMP DS 800-160 TAB AMN 2x daily for 7 days. I will have another urine sample taken on Jan. 13th and will see Dr. Inez Catalina again on Jan. 17th. With my hip replacement surgery on Jan. 19th scheduled with you, Dr. Inez Catalina wanted me to advise you of what is going on. She mentioned she could keep me on antibiotics between now and through my surgery date and beyond if needed. I really need to have this new hip but need your opinion on whether you feel it should still be done. Dr. Inez Catalina can fax to you my latest urine lab results if you want/need to see that information. Please give me your Fax number. Thank you for your time and help with this. Angela Calhoun

## 2015-12-15 NOTE — Telephone Encounter (Signed)
From: [name]  To: [name], MD  Sent: 12/14/2015 12:32 PM PST  Subject: 20-Other    Sorry to bother you but I just got a phone call from [place] on my home phone and was unable to pick up the call in time before it went to recording. Then I couldn't figure out the phone number to call back to because it was garbled and only heard [phone]....  Since it was a recorded message and not a live person speaking, it may only have been to remind me I have an upcoming pre-op appt. with anesthesia on Jan. 17th. Any way I thought I would send this message and inquire as a just in case. Thank you, Brendia Sacks

## 2015-12-20 LAB — EMMI , ANESTHESIA (ADULT): EMMI Video Order Number: 19578443988

## 2015-12-22 ENCOUNTER — Encounter (INDEPENDENT_AMBULATORY_CARE_PROVIDER_SITE_OTHER): Payer: Self-pay | Admitting: Nurse Practitioner

## 2015-12-22 ENCOUNTER — Ambulatory Visit (INDEPENDENT_AMBULATORY_CARE_PROVIDER_SITE_OTHER): Payer: Medicare Other | Admitting: Nurse Practitioner

## 2015-12-22 ENCOUNTER — Other Ambulatory Visit (INDEPENDENT_AMBULATORY_CARE_PROVIDER_SITE_OTHER): Payer: Medicare Other

## 2015-12-22 VITALS — BP 132/70 | HR 73 | Temp 97.4°F | Resp 16 | Ht 65.5 in | Wt 256.6 lb

## 2015-12-22 DIAGNOSIS — Z01818 Encounter for other preprocedural examination: Principal | ICD-10-CM

## 2015-12-22 LAB — COMPREHENSIVE METABOLIC PANEL, BLOOD
ALT (SGPT): 21 U/L (ref 0–33)
AST (SGOT): 22 U/L (ref 0–32)
Albumin: 4.2 g/dL (ref 3.5–5.2)
Alkaline Phos: 92 U/L (ref 35–140)
Anion Gap: 11 mmol/L (ref 7–15)
BUN: 20 mg/dL (ref 8–23)
Bicarbonate: 26 mmol/L (ref 22–29)
Bilirubin, Tot: 0.35 mg/dL (ref <1.20)
Calcium: 10.7 mg/dL — ABNORMAL HIGH (ref 8.5–10.6)
Chloride: 100 mmol/L (ref 98–107)
Creatinine: 1.1 mg/dL — ABNORMAL HIGH (ref 0.51–0.95)
GFR: 49 mL/min
Glucose: 92 mg/dL (ref 70–99)
Potassium: 4.6 mmol/L (ref 3.5–5.1)
Sodium: 137 mmol/L (ref 136–145)
Total Protein: 7.1 g/dL (ref 6.0–8.0)

## 2015-12-22 LAB — GLYCOSYLATED HGB(A1C), BLOOD: Glyco Hgb (A1C): 5.6 % (ref 4.8–5.9)

## 2015-12-22 LAB — HEMOGRAM, BLOOD
Hct: 43.2 % (ref 34.0–45.0)
Hgb: 13.7 gm/dL (ref 11.2–15.7)
MCH: 27.8 pg (ref 26.0–32.0)
MCHC: 31.7 % — ABNORMAL LOW (ref 32.0–36.0)
MCV: 87.8 um3 (ref 79.0–95.0)
MPV: 10 fL (ref 9.4–12.4)
Plt Count: 201 10*3/uL (ref 140–370)
RBC: 4.92 10*6/uL (ref 3.90–5.20)
RDW: 15.6 % — ABNORMAL HIGH (ref 12.0–14.0)
WBC: 9.3 10*3/uL (ref 4.0–10.0)

## 2015-12-22 LAB — SED RATE, BLOOD: Sed Rate: 6 mm/hr (ref 0–30)

## 2015-12-22 LAB — TYPE & SCREEN
ABO/RH: A POS
Antibody Screen: NEGATIVE

## 2015-12-22 LAB — APTT, BLOOD: PTT: 30.6 s (ref 25.0–34.0)

## 2015-12-22 LAB — PROTHROMBIN TIME, BLOOD
INR: 1
PT,Patient: 10.7 s (ref 9.7–12.5)

## 2015-12-22 MED ORDER — VITAMIN D3 75 MCG (3000 UT) PO TABS: 1.00 | ORAL_TABLET | Freq: Every day | ORAL | Status: AC

## 2015-12-22 MED ORDER — PROBIOTIC DAILY PO: ORAL | Status: AC

## 2015-12-22 MED ORDER — CENTRUM SILVER OR TABS: 1.00 | ORAL_TABLET | Freq: Every day | ORAL | Status: AC

## 2015-12-22 MED ORDER — CRANBERRY 300 MG OR TABS: ORAL_TABLET | ORAL | Status: AC

## 2015-12-22 NOTE — Patient Instructions (Signed)
PREOPERATIVE ANESTHESIA INFORMATION    Your surgery is currently scheduled at  [facility]  hospital/facility/department on   With a planned report time of 83am        ?  [place], [address], [place], [place] [id number]    The main enterance to [facility] is thru [place]        Please check in at Main Admissions on the 1st floor              QUESTIONS  If you have any questions between now and the day of your surgery, please do not hesitate to call:     [place]: [phone]             DAY OF SURGERY ARRIVAL TIME:  On the day of your Surgery/Procedure, please arrive at the time provided by the surgery/preop team. If you have any questions regarding your arrival time, please call:         [facility]                          [phone]       MEDICATION INSTRUCTIONS:     MEDICATIONS TO STOP 7 DAYS BEFORE SURGERY/PROCEDURE:     PLEASE HOLD ASPIRIN AND ALL NSAIDS (non-steroidal anti-inflammatory drugs) SUCH AS advil, aleve, motrin, ibuprofen, relafen, lodine, feldene, Diclofenac, voltaren, indomethacin, naproxen, celebrex, Mobic.        Please hold vitamins, supplements, herbs & fish oil.       REGULAR PRESCRIPTION MEDICATIONS:     Regular prescription medications should be taken the day of surgery with sips of water EXCEPT lisinopril        AFTER YOUR VISIT WITH [place], IF YOU START TAKING A NEW MEDICATION BEFORE SURGERY, PLEASE CALL us TO MAKE SURE IT IS SAFE TO TAKE & WON'T EFFECT YOUR SURGERY.                TO DO LIST:       Please go to the LAB; [id number] (Lab closed 12PM-1PM) on 3rd floor                                                                                 Sleep Apnea   ?                                                         Because you have diagnosed sleep apnea or because you have been screened today as high-risk for sleep  apnea, you are at an elevated risk for breathing problems that could causesleep disturbances or even more serious conditions the first week after your surgery. If you have a CPAP machine, 1. please use your CPAP every night up to your surgery, 2. Please bring your CPAP mask and tubing with you to surgery, and 3. Please use your CPAP after surgery  every time you sleep. This is especially important the first week after surgerybecause of the known increase of apneaevents in this time period. It is recommended that all patients with diagnosedsleep apnea or those deemed high risk do not take sedatives or drink alcohol while on pain medications. With sleep apnea, breathing while asleep is easier for the body when laying on your side or stomach. Please try not to sleep on your back.     EATING/DRINKING     PLEASE DO NOT EAT OR DRINK ANYTHING AFTER MIDNIGHT THE NIGHT BEFORE SURGERY.                                                             ?  Preparing for your Surgery:     Please wear clean loose-fitting clothes and leave valuables at home        Bring a picture ID and your insurance card, and be prepared to pay your deductible or co-insurance by cash, check, or credit card when you arrive.        If you are going home after your surgery, please make sure to arrange for an adult to drive you home. If you do not do so, your surgery may be cancelled.      On The Day of Your Surgery:      Check in at the location mentioned above        You will meet your anesthesia and surgery teams before surgery.        Once surgery is over, you will wake up in the recovery room where you will be able to see your friends/family.        Once your time in the recovery room is complete, you will either go home or be admitted as planned.        If you go home, someone will need to stay with you for the first 24 hours after surgery.       Additional information about what to expect before & after surgery is  available online at:  [email or web address]    Or by searching "You-tube" for [place] before surgery and [place] after surgery    You medical records are available to you at [email or web address]  Select create account.

## 2015-12-22 NOTE — Addendum Note (Signed)
Addended by: Aida Raider on: 12/22/2015 04:31 PM     Modules accepted: Orders

## 2015-12-22 NOTE — Anesthesia Preprocedure Evaluation (Addendum)
ANESTHESIA PRE-OPERATIVE EVALUATION    Patient Information    Name: Angela Calhoun    MRN: [id number]    DOB: Aug 05, 1946    Age: 70 year old    Sex: female  Procedure(s):  ARTHROPLASTY, LT HIP      BP 132/70  Pulse 73  Temp 97.4 F (36.3 C) (Oral)  Resp 16  Ht 5' 5.5" (1.664 m)  Wt 116.4 kg (256 lb 9.9 oz)  SpO2 98%  BMI 42.05 kg/m2   BMI kg/m2: 42.05 kg/m2    Primary language spoken:  English    ROS/Medical History:  General Review & History of Anesthetic Complications:   Patient summary reviewed   No history of anesthetic complications, No PONV, No history of difficult intubation and no family history of anesthetic complications     Slow to emerge from gallbladder surgery with geta ~2005  Cardiovascular:    Exercise tolerance: >4 METS (Can go up flight of stairs w/o cp can walk a block    walking w/o assistance )  (+)  hypercholesterolemia  hypertension well controlled        (-) angina, DOE, cardiac stents    ECG reviewed  ROS comment: Cleared for surgery as low risk    ekg nsr 08/2015    Pulmonary:  - negative ROS   (+) recent URI, , , , ,   (-) COPD, asthma, shortness of breathSleep apnea: snores and has htn  Hematology/Oncology:   - negative ROS  (-)  chemotherapy and radiation treatment    ROS Comments + hx L kidney cancer, stage 1 per pt report: s/p lap Left nephrectomy: 08/21/2013. No Chemo/xrt.   Neuro/Psych:   - negative ROS       (-) seizures, CVA, numbness, weakness   Infectious Disease:   Negative ROS         Endo/Other:      (+) hypothyroidism, arthritis  (-) diabetes mellitus, hyperthyroidism, blood dyscrasia, no back pain    ROS Comments: Left hip oa    sp rt hip and left knee replacement  GI/Hepatic:     (+) GERD well controlled,   (-) hepatitis, liver disease, renal disease   Renal:         No chronic renal disease, Electrolyte acid base abnormality and IV Contrast in last 24-48 hours  ROS comments Sp nephrectomy 2/2 rcc   Had ua sample done Monday by urologist at [facility]  - was okay -  had cx done will fax results to dr ball   No urinary sx's    last uti treated 1 week ago  Pregnancy History:  Para: 0  Gravida: 0  Currently pregnant? no       [facility]) Notes:  no beta blocker therapy  [facility] Test & records reviewed by [facility] provider  [facility] Echo/ECG/Angio test (EKG: 11/19/2013: NSR, 68 BPM, nl EKG. (See OSH record in EMR)) available.  [facility] labs available.      T/s ,urine,esr   Reviewed avs :  :    Studies ordered by [facility] practitioner: CBC, COAGs and Hematology    T/s ,urine,esr   Reviewed avs :          Physical Exam    Airway:  Inter-inciser distance 3-4 cm  Prognanth Able    Mallampati: III  Neck ROM: full  TM distance: 5-6 cm  Short thick neck: No    Cardiovascular:  - cardiovascular exam normal   Rhythm: regular  Rate: normal     Negative for murmur     Pulmonary:  - pulmonary exam normal      breath sounds clear to auscultation        Neuro/Neck/Skeletal/Skin:  - [place] ANE PHYS EXAM NEGATIVE ROS SKIN SKELETAL NEURO NECK          Dental:  - normal exam    Abdominal:   - normal exam     General: obesity     Additional Clinical Notes:                      Has a physician diagnosed you with sleep agnea?: No  Do you use a CPAP at home?: No       Past Medical History   Diagnosis Date   . Arthritis of knee      Left > Right   . Dyslipidemia    . GERD (gastroesophageal reflux disease)    . HTN (hypertension)    . Hypothyroidism    . Left Kidney carcinoma 08/21/2013 sx     s/p Left nephrectomy 08/21/2013: no CHEMO/XRT   . Osteoarthritis of hip      Left and s/p R THR     Past Surgical History   Procedure Laterality Date   . Cholecystectomy, lap  2008   . 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     . Pb remove tonsils/adenoids,<13 y/o     . Pb biopsy of breast, incisional  1990's   . Knee i&d  1980's     Traumatic Injury to Knee    . Pb laparoscopy radical nephrectomy Left 08/21/2013     Cancer of Left kidney   . Colonoscopy     . S/p right thr  2011     [place] per pt report  with REG   . Sp arthroplasty left  total knee  2015     Social History   Substance Use Topics   . Smoking status: Former Smoker     Packs/day: 2.00     Years: 12.00     Types: Cigarettes     Quit date: 12/24/1980   . 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 Alcohol use No       Current Outpatient Prescriptions   Medication Sig Dispense Refill   . Acetaminophen (TYLENOL PO) Take 650 mg by mouth 4 times daily as needed.     . Cholecalciferol (VITAMIN D3) 3000 UNITS tablet Take 1 tablet by mouth daily. Taking  1000 iu     . Cranberry 300 MG TABS Bid     . levothyroxine (LEVOXYL) 137 MCG tablet Take 137 mcg by mouth every morning.     Marland Kitchen lisinopril-hydrochlorothiazide (PRINZIDE, ZESTORETIC) 10-12.5 MG per tablet Take 1 tablet by mouth every morning.     . multivitamin (CENTRUM SILVER) tablet Take 1 tablet by mouth daily.     Marland Kitchen omeprazole (PRILOSEC) 20 MG capsule Take 20 mg by mouth every morning.     . Probiotic Product (PROBIOTIC DAILY PO)      . simvastatin (ZOCOR) 10 MG tablet Take 10 mg by mouth every evening.       No current facility-administered medications for this visit.      Allergies   Allergen Reactions   . Dermabond [Other] Rash   . Adhesive Tape Rash  Labs and Other Data  Lab Results   Component Value Date    NA 137 01/07/2014    K 4.3 01/07/2014    CL 104 01/07/2014    BICARB 23 01/07/2014    BUN 19 01/07/2014    CREAT 0.95 01/07/2014    GLU 122 01/07/2014    [place] 9.3 01/07/2014     No results found for: AST, ALT, GGT, LDH, ALK, TP, ALB, TBILI, DBILI  Lab Results   Component Value Date    WBC 12.2 01/07/2014    RBC 4.28 01/07/2014    HGB 11.6 01/07/2014    HCT 36.1 01/07/2014    MCV 84.3 01/07/2014    MCHC 32.1 01/07/2014    RDW 15.3 01/07/2014    PLT 160 01/07/2014    MPV 10.3 01/07/2014    SEG 59 12/24/2013    LYMPHS 32 12/24/2013    MONOS 7 12/24/2013    EOS 2 12/24/2013     Lab Results   Component Value Date    INR 1.0 12/24/2013     No  results found for: ARTPH, ARTPO2, ARTPCO2    Labs pending   Anesthesia Plan:  ASA 2 (Mild systemic disease)        Planned monitoring method: Routine monitoring      Comments: (Pt educated about regional/epidural/spinal anesthesia.  R&B reviewed Questions addressed.  Handout given to patient in [facility].)

## 2015-12-22 NOTE — H&P (Signed)
Anesthesia Consult/Preoperative History & Physical Exam    C/C:  Preoperative evaluation for upcoming surgery/procedure under anesthesia.     Referring Physician: [name]  Primary Care Physician: Katheren Puller     HPI:  Angela Calhoun is a 70 year old female who was seen by preoperative clinic for evaluation prior to elective ARTHROPLASTY, LT HIP  Patient is scheduled on DOS: 12/24/15  at [facility]  Current symptoms include left hip arthritis, and associated pain    ANESTHESIA PRE-OPERATIVE EVALUATION    Patient Information    Name: Angela Calhoun    MRN: [id number]    DOB: Jan 16, 1946    Age: 70 year old    Sex: female  Procedure(s):        BP 132/70  Pulse 73  Temp 97.4 F (36.3 C) (Oral)  Resp 16  Ht 5' 5.5" (1.664 m)  Wt 116.4 kg (256 lb 9.9 oz)  SpO2 98%  BMI 42.05 kg/m2   BMI kg/m2: 42.05 kg/m2    Primary language spoken:  English    ROS/Medical History:  General Review & History of Anesthetic Complications:   Patient summary reviewed   No history of anesthetic complications, No PONV, No history of difficult intubation and no family history of anesthetic complications     Slow to emerge from gallbladder surgery with geta ~2005  Cardiovascular:    Exercise tolerance: >4 METS (Can go up flight of stairs w/o cp can walk a block    walking w/o assistance )  (+)  hypercholesterolemia  hypertension well controlled        (-) angina, DOE, cardiac stents    ECG reviewed  ROS comment: Cleared for surgery as low risk per her Dr   Angela Calhoun nsr 08/2015    Pulmonary:  - negative ROS   (+) recent URI, , , , ,   (-) COPD, asthma, shortness of breathSleep apnea: snores and has htn - high risk for osa  Hematology/Oncology:   - negative ROS  (-)  chemotherapy and radiation treatment    ROS Comments + hx L kidney cancer, stage 1 per pt report: s/p lap Left nephrectomy: 08/21/2013. No Chemo/xrt.   Neuro/Psych:   - negative ROS       (-) seizures, CVA, numbness, weakness   Infectious Disease:   Negative ROS         Endo/Other:        (+) hypothyroidism, arthritis  (-) diabetes mellitus, hyperthyroidism, blood dyscrasia, no back pain    ROS Comments: Left hip oa    sp rt hip and left knee replacement  GI/Hepatic:     (+) GERD well controlled,   (-) hepatitis, liver disease, renal disease   Renal:         No chronic renal disease, Electrolyte acid base abnormality and IV Contrast in last 24-48 hours  ROS comments Sp nephrectomy 2/2 rcc   Had ua sample done Monday by urologist at [facility]  - was okay - had cx done will fax results to dr ball   No urinary sx's    last uti treated 1 week ago  Pregnancy History:  Para: 0  Gravida: 0  Currently pregnant? no       [facility]) Notes:  no beta blocker therapy  [facility] Test & records reviewed by [facility] provider  [facility] Echo/ECG/Angio test (EKG: 11/19/2013: NSR, 68 BPM, nl EKG. (See OSH record in EMR)) available.  [facility] labs available.  T/s ,urine,esr   Reviewed avs :  :    Studies ordered by [facility] practitioner: CBC, COAGs and Hematology    T/s ,urine,esr   Reviewed avs :    Physical Exam    Airway:  Inter-inciser distance 3-4 cm  Prognanth Able    Mallampati: III  Neck ROM: full  TM distance: 5-6 cm  Short thick neck: No    Cardiovascular:  - cardiovascular exam normal   Rhythm: regular   Rate: normal     Negative for murmur     Pulmonary:  - pulmonary exam normal      breath sounds clear to auscultation        Neuro/Neck/Skeletal/Skin:  - [place] ANE PHYS EXAM NEGATIVE ROS SKIN SKELETAL NEURO NECK    Dental:  - normal exam    Abdominal:   - normal exam     General: obesity     Additional Clinical Notes:     Has a physician diagnosed you with sleep agnea?: No  Do you use a CPAP at home?: No       Past Medical History   Diagnosis Date   . Arthritis of knee      Left > Right   . Dyslipidemia    . GERD (gastroesophageal reflux disease)    . HTN (hypertension)    . Hypothyroidism    . Left Kidney carcinoma 08/21/2013 sx     s/p Left nephrectomy 08/21/2013: no CHEMO/XRT   . Osteoarthritis of hip       Left and s/p R THR     Past Surgical History   Procedure Laterality Date   . Cholecystectomy, lap  2008   . 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     . Pb remove tonsils/adenoids,<13 y/o     . Pb biopsy of breast, incisional  1990's   . Knee i&d  1980's     Traumatic Injury to Knee    . Pb laparoscopy radical nephrectomy Left 08/21/2013     Cancer of Left kidney   . Colonoscopy     . S/p right thr  2011     [place] per pt report with [facility]   . Sp arthroplasty left  total knee  2015     Social History   Substance Use Topics   . Smoking status: Former Smoker     Packs/day: 2.00     Years: 12.00     Types: Cigarettes     Quit date: 12/24/1980   . 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 Alcohol use No       Current Outpatient Prescriptions   Medication Sig Dispense Refill   . Acetaminophen (TYLENOL PO) Take 650 mg by mouth 4 times daily as needed.     . Cholecalciferol (VITAMIN D3) 3000 UNITS tablet Take 1 tablet by mouth daily. Taking  1000 iu     . Cranberry 300 MG TABS Bid     . levothyroxine (LEVOXYL) 137 MCG tablet Take 137 mcg by mouth every morning.     Marland Kitchen lisinopril-hydrochlorothiazide (PRINZIDE, ZESTORETIC) 10-12.5 MG per tablet Take 1 tablet by mouth every morning.     . multivitamin (CENTRUM SILVER) tablet Take 1 tablet by mouth daily.     Marland Kitchen omeprazole (PRILOSEC) 20 MG capsule Take 20 mg by mouth every morning.     . Probiotic  Product (PROBIOTIC DAILY PO)      . simvastatin (ZOCOR) 10 MG tablet Take 10 mg by mouth every evening.       No current facility-administered medications for this visit.      Allergies   Allergen Reactions   . Dermabond [Other] Rash   . Adhesive Tape Rash       Labs and Other Data  Lab Results   Component Value Date    NA 137 01/07/2014    K 4.3 01/07/2014    CL 104 01/07/2014    BICARB 23 01/07/2014    BUN 19 01/07/2014    CREAT 0.95 01/07/2014    GLU 122 01/07/2014    [place] 9.3 01/07/2014     No results  found for: AST, ALT, GGT, LDH, ALK, TP, ALB, TBILI, DBILI  Lab Results   Component Value Date    WBC 12.2 01/07/2014    RBC 4.28 01/07/2014    HGB 11.6 01/07/2014    HCT 36.1 01/07/2014    MCV 84.3 01/07/2014    MCHC 32.1 01/07/2014    RDW 15.3 01/07/2014    PLT 160 01/07/2014    MPV 10.3 01/07/2014    SEG 59 12/24/2013    LYMPHS 32 12/24/2013    MONOS 7 12/24/2013    EOS 2 12/24/2013     Lab Results   Component Value Date    INR 1.0 12/24/2013          Labs pending     Anesthesia Plan:  ASA 2 (Mild systemic disease)        Planned monitoring method: Routine monitoring      Comments: (Pt educated about regional/epidural/spinal anesthesia.  R&B reviewed Questions addressed.  Handout given to patient in [facility].)            Diagnosis:   Encounter Diagnoses   Name Primary?   . Preop examination Yes       EKG: nsr 08/2015     Anesthesia Assessment:  Angela Calhoun is a 70 year old female with history as stated above presenting to preop clinic for evaluation prior to elective surgery.    -Preoperative history and physical completed today.       Plan/Recommendations:  -Angela Calhoun is low risk for an intermediate risk elective surgery  And can proceed with surgery as planned.   -Pre Op Tests Ordered:cbc, cmp, t/s, esr,pt,ptt a1c  -Patient provided Written Pre Op Instructions (See EPIC Notes/Trans=Pt Instructions)  -Risks/Benefits of Anesthesia discussed, Dental risks explained    Angela Calhoun , MSN, FNP-C  [facility]  Tel: [phone]

## 2015-12-23 LAB — MRSA SURVEILLANCE CULTURE

## 2015-12-23 NOTE — Progress Notes (Signed)
LABS NOTED

## 2015-12-24 ENCOUNTER — Other Ambulatory Visit (INDEPENDENT_AMBULATORY_CARE_PROVIDER_SITE_OTHER): Payer: Self-pay | Admitting: Orthopaedic Surgery

## 2015-12-24 ENCOUNTER — Inpatient Hospital Stay
Admission: RE | Admit: 2015-12-24 | Discharge: 2015-12-26 | DRG: 470 | Disposition: A | Discharge status: Home Health | Payer: Medicare Other | Attending: Orthopaedic Surgery | Admitting: Orthopaedic Surgery

## 2015-12-24 ENCOUNTER — Encounter (HOSPITAL_COMMUNITY): Admission: RE | Disposition: A | Discharge status: Home Health | Payer: Self-pay | Attending: Orthopaedic Surgery

## 2015-12-24 ENCOUNTER — Telehealth (HOSPITAL_BASED_OUTPATIENT_CLINIC_OR_DEPARTMENT_OTHER): Payer: Self-pay | Admitting: Orthopaedic Surgery

## 2015-12-24 ENCOUNTER — Inpatient Hospital Stay (HOSPITAL_COMMUNITY): Payer: Medicare Other | Admitting: Nurse Practitioner

## 2015-12-24 ENCOUNTER — Inpatient Hospital Stay (HOSPITAL_COMMUNITY): Payer: Medicare Other | Admitting: Student in an Organized Health Care Education/Training Program

## 2015-12-24 ENCOUNTER — Other Ambulatory Visit (HOSPITAL_BASED_OUTPATIENT_CLINIC_OR_DEPARTMENT_OTHER): Payer: Self-pay

## 2015-12-24 DIAGNOSIS — D62 Acute posthemorrhagic anemia: Secondary | ICD-10-CM | POA: Diagnosis not present

## 2015-12-24 DIAGNOSIS — Z6841 Body Mass Index (BMI) 40.0 and over, adult: Secondary | ICD-10-CM

## 2015-12-24 DIAGNOSIS — K219 Gastro-esophageal reflux disease without esophagitis: Secondary | ICD-10-CM | POA: Diagnosis present

## 2015-12-24 DIAGNOSIS — Z85528 Personal history of other malignant neoplasm of kidney: Secondary | ICD-10-CM

## 2015-12-24 DIAGNOSIS — I10 Essential (primary) hypertension: Secondary | ICD-10-CM | POA: Diagnosis present

## 2015-12-24 DIAGNOSIS — R6889 Other general symptoms and signs: Secondary | ICD-10-CM

## 2015-12-24 DIAGNOSIS — Z96652 Presence of left artificial knee joint: Secondary | ICD-10-CM | POA: Diagnosis present

## 2015-12-24 DIAGNOSIS — E669 Obesity, unspecified: Secondary | ICD-10-CM | POA: Diagnosis present

## 2015-12-24 DIAGNOSIS — G8929 Other chronic pain: Secondary | ICD-10-CM | POA: Diagnosis present

## 2015-12-24 DIAGNOSIS — E039 Hypothyroidism, unspecified: Secondary | ICD-10-CM | POA: Diagnosis present

## 2015-12-24 DIAGNOSIS — Z96642 Presence of left artificial hip joint: Secondary | ICD-10-CM

## 2015-12-24 DIAGNOSIS — M25552 Pain in left hip: Secondary | ICD-10-CM

## 2015-12-24 DIAGNOSIS — Z87891 Personal history of nicotine dependence: Secondary | ICD-10-CM

## 2015-12-24 DIAGNOSIS — E785 Hyperlipidemia, unspecified: Secondary | ICD-10-CM | POA: Diagnosis present

## 2015-12-24 DIAGNOSIS — Z471 Aftercare following joint replacement surgery: Secondary | ICD-10-CM

## 2015-12-24 DIAGNOSIS — M1612 Unilateral primary osteoarthritis, left hip: Secondary | ICD-10-CM

## 2015-12-24 DIAGNOSIS — Z905 Acquired absence of kidney: Secondary | ICD-10-CM

## 2015-12-24 DIAGNOSIS — Z96641 Presence of right artificial hip joint: Secondary | ICD-10-CM | POA: Diagnosis present

## 2015-12-24 DIAGNOSIS — R262 Difficulty in walking, not elsewhere classified: Secondary | ICD-10-CM

## 2015-12-24 DIAGNOSIS — M169 Osteoarthritis of hip, unspecified: Secondary | ICD-10-CM | POA: Diagnosis present

## 2015-12-24 SURGERY — ARTHROPLASTY, HIP
Anesthesia: General | Site: Hip | Laterality: Left

## 2015-12-24 MED ORDER — ACETAMINOPHEN 325 MG PO TABS
975.0000 mg | ORAL_TABLET | Freq: Three times a day (TID) | ORAL | Status: DC
Start: 2015-12-24 — End: 2015-12-26
  Administered 2015-12-24 – 2015-12-26 (×6): 975 mg via ORAL
  Filled 2015-12-24 (×6): qty 3

## 2015-12-24 MED ORDER — PROPOFOL IV BOLUS 10 MG/ML
INTRAVENOUS | Status: DC | PRN
Start: 2015-12-24 — End: 2015-12-24
  Administered 2015-12-24: 200 mg via INTRAVENOUS

## 2015-12-24 MED ORDER — ONDANSETRON HCL 4 MG/2ML IV SOLN
4.0000 mg | Freq: Once | INTRAMUSCULAR | Status: DC | PRN
Start: 2015-12-24 — End: 2015-12-24

## 2015-12-24 MED ORDER — MAGNESIUM HYDROXIDE 400 MG/5ML OR SUSP
30.0000 mL | Freq: Two times a day (BID) | ORAL | Status: DC | PRN
Start: 2015-12-25 — End: 2015-12-26

## 2015-12-24 MED ORDER — LISINOPRIL 10 MG OR TABS
10.0000 mg | ORAL_TABLET | Freq: Every day | ORAL | Status: DC
Start: 2015-12-24 — End: 2015-12-26
  Administered 2015-12-26: 10 mg via ORAL
  Filled 2015-12-24 (×2): qty 1

## 2015-12-24 MED ORDER — OXYCODONE HCL 5 MG OR TABS
5.0000 mg | ORAL_TABLET | ORAL | Status: DC | PRN
Start: 2015-12-24 — End: 2015-12-26
  Administered 2015-12-24 – 2015-12-25 (×2): 5 mg via ORAL
  Filled 2015-12-24 (×2): qty 1

## 2015-12-24 MED ORDER — ACETAMINOPHEN 10 MG/ML IV SOLN
INTRAVENOUS | Status: DC | PRN
Start: 2015-12-24 — End: 2015-12-24
  Administered 2015-12-24: 1000 mg via INTRAVENOUS

## 2015-12-24 MED ORDER — DOCUSATE SODIUM 250 MG OR CAPS
250.0000 mg | ORAL_CAPSULE | Freq: Two times a day (BID) | ORAL | Status: AC
Start: 2015-12-24 — End: 2015-12-25
  Administered 2015-12-24 – 2015-12-25 (×2): 250 mg via ORAL
  Filled 2015-12-24 (×2): qty 1

## 2015-12-24 MED ORDER — MULTI-VITAMINS PO TABS
1.0000 | ORAL_TABLET | Freq: Every day | ORAL | Status: DC
Start: 2015-12-24 — End: 2015-12-26
  Administered 2015-12-24 – 2015-12-26 (×3): 1 via ORAL
  Filled 2015-12-24 (×3): qty 1

## 2015-12-24 MED ORDER — LEVOTHYROXINE SODIUM 137 MCG OR TABS
137.0000 ug | ORAL_TABLET | Freq: Every morning | ORAL | Status: DC
Start: 2015-12-25 — End: 2015-12-26
  Administered 2015-12-25 – 2015-12-26 (×2): 137 ug via ORAL
  Filled 2015-12-24 (×2): qty 1

## 2015-12-24 MED ORDER — DEXAMETHASONE 6 MG OR TABS
6.0000 mg | ORAL_TABLET | Freq: Once | ORAL | Status: AC
Start: 2015-12-25 — End: 2015-12-25
  Administered 2015-12-25: 6 mg via ORAL
  Filled 2015-12-24: qty 1

## 2015-12-24 MED ORDER — NALOXONE HCL 0.4 MG/ML IJ SOLN
0.1000 mg | INTRAMUSCULAR | Status: DC | PRN
Start: 2015-12-24 — End: 2015-12-26

## 2015-12-24 MED ORDER — FENTANYL CITRATE (PF) 250 MCG/5ML IJ SOLN
INTRAMUSCULAR | Status: DC | PRN
Start: 2015-12-24 — End: 2015-12-24
  Administered 2015-12-24: 100 ug via INTRAVENOUS

## 2015-12-24 MED ORDER — ONDANSETRON HCL 4 MG/2ML IV SOLN
4.0000 mg | Freq: Four times a day (QID) | INTRAMUSCULAR | Status: AC
Start: 2015-12-24 — End: 2015-12-25
  Administered 2015-12-24 – 2015-12-25 (×4): 4 mg via INTRAVENOUS
  Filled 2015-12-24 (×4): qty 2

## 2015-12-24 MED ORDER — DIPHENHYDRAMINE HCL 50 MG/ML IJ SOLN
12.5000 mg | Freq: Once | INTRAMUSCULAR | Status: DC | PRN
Start: 2015-12-24 — End: 2015-12-24

## 2015-12-24 MED ORDER — BISACODYL 10 MG RE SUPP
10.0000 mg | Freq: Every day | RECTAL | Status: DC | PRN
Start: 2015-12-26 — End: 2015-12-26

## 2015-12-24 MED ORDER — SODIUM CHLORIDE 0.9 % IV SOLN
INTRAVENOUS | Status: DC
Start: 2015-12-24 — End: 2015-12-26
  Administered 2015-12-24 – 2015-12-25 (×2): via INTRAVENOUS

## 2015-12-24 MED ORDER — LACTATED RINGERS IV SOLN
INTRAVENOUS | Status: DC
Start: 2015-12-24 — End: 2015-12-24
  Administered 2015-12-24: 07:00:00 via INTRAVENOUS

## 2015-12-24 MED ORDER — ONDANSETRON HCL 4 MG/2ML IV SOLN
INTRAMUSCULAR | Status: DC | PRN
Start: 2015-12-24 — End: 2015-12-24
  Administered 2015-12-24: 4 mg via INTRAVENOUS

## 2015-12-24 MED ORDER — PREGABALIN 75 MG OR CAPS
225.00 mg | ORAL_CAPSULE | Freq: Once | ORAL | Status: AC
Start: 2015-12-24 — End: 2015-12-24
  Administered 2015-12-24: 225 mg via ORAL
  Filled 2015-12-24: qty 3

## 2015-12-24 MED ORDER — FENTANYL CITRATE (PF) 100 MCG/2ML IJ SOLN
50.0000 ug | INTRAMUSCULAR | Status: DC | PRN
Start: 2015-12-24 — End: 2015-12-24

## 2015-12-24 MED ORDER — CEFAZOLIN SODIUM 1 GM IJ SOLR
INTRAMUSCULAR | Status: DC | PRN
Start: 2015-12-24 — End: 2015-12-24
  Administered 2015-12-24: 2000 mg via INTRAVENOUS

## 2015-12-24 MED ORDER — SODIUM CHLORIDE 0.9 % IV SOLN
10.0000 mg | INTRAVENOUS | Status: DC | PRN
Start: 2015-12-24 — End: 2015-12-24
  Administered 2015-12-24: 50 ug/min via INTRAVENOUS

## 2015-12-24 MED ORDER — ROPIVACAINE HCL 5 MG/ML IJ SOLN
INTRAMUSCULAR | Status: DC | PRN
Start: 2015-12-24 — End: 2015-12-24
  Administered 2015-12-24: 60 mL via INTRA_ARTICULAR
  Filled 2015-12-24: qty 60

## 2015-12-24 MED ORDER — VANCOMYCIN HCL 1000 MG IV SOLR
INTRAVENOUS | Status: DC | PRN
Start: 2015-12-24 — End: 2015-12-24
  Administered 2015-12-24: 500 mg

## 2015-12-24 MED ORDER — SODIUM CHLORIDE 0.9 % IV SOLN
12.5000 mg | Freq: Once | INTRAVENOUS | Status: DC | PRN
Start: 2015-12-24 — End: 2015-12-24

## 2015-12-24 MED ORDER — PSYLLIUM 100 % OR PACK
1.0000 | PACK | Freq: Every day | ORAL | Status: DC
Start: 2015-12-25 — End: 2015-12-26
  Administered 2015-12-25 – 2015-12-26 (×2): 6 g via ORAL
  Filled 2015-12-24 (×2): qty 1

## 2015-12-24 MED ORDER — MENTHOL 3 MG MT LOZG
1.0000 | LOZENGE | OROMUCOSAL | Status: DC | PRN
Start: 2015-12-24 — End: 2015-12-26

## 2015-12-24 MED ORDER — SODIUM CHLORIDE 0.9 % IV SOLN
2000.0000 mg | Freq: Three times a day (TID) | INTRAVENOUS | Status: AC
Start: 2015-12-24 — End: 2015-12-24
  Administered 2015-12-24 (×2): 2000 mg via INTRAVENOUS
  Filled 2015-12-24 (×2): qty 2000

## 2015-12-24 MED ORDER — CELECOXIB 200 MG OR CAPS
200.0000 mg | ORAL_CAPSULE | Freq: Two times a day (BID) | ORAL | Status: DC
Start: 2015-12-24 — End: 2015-12-26
  Administered 2015-12-24 – 2015-12-26 (×4): 200 mg via ORAL
  Filled 2015-12-24 (×4): qty 1

## 2015-12-24 MED ORDER — MEPERIDINE HCL 25 MG/ML IJ SOLN
12.5000 mg | INTRAMUSCULAR | Status: DC | PRN
Start: 2015-12-24 — End: 2015-12-24

## 2015-12-24 MED ORDER — ROCURONIUM BROMIDE 100 MG/10ML IV SOLN
INTRAVENOUS | Status: DC | PRN
Start: 2015-12-24 — End: 2015-12-24
  Administered 2015-12-24: 30 mg via INTRAVENOUS

## 2015-12-24 MED ORDER — DIPHENHYDRAMINE HCL 25 MG OR TABS OR CAPS CUSTOM
25.0000 mg | ORAL_CAPSULE | Freq: Three times a day (TID) | ORAL | Status: DC | PRN
Start: 2015-12-24 — End: 2015-12-26

## 2015-12-24 MED ORDER — LIDOCAINE HCL (CARDIAC) 20 MG/ML IV SOLN
INTRAVENOUS | Status: DC | PRN
Start: 2015-12-24 — End: 2015-12-24
  Administered 2015-12-24: 100 mg via INTRAVENOUS

## 2015-12-24 MED ORDER — OXYCODONE HCL 5 MG OR TABS
5.0000 mg | ORAL_TABLET | ORAL | Status: DC | PRN
Start: 2015-12-24 — End: 2015-12-26

## 2015-12-24 MED ORDER — MIDAZOLAM HCL 2 MG/2ML IJ SOLN
INTRAMUSCULAR | Status: DC | PRN
Start: 2015-12-24 — End: 2015-12-24
  Administered 2015-12-24: 2 mg via INTRAVENOUS

## 2015-12-24 MED ORDER — SIMVASTATIN 10 MG OR TABS
10.0000 mg | ORAL_TABLET | Freq: Every evening | ORAL | Status: DC
Start: 2015-12-24 — End: 2015-12-26
  Administered 2015-12-24 – 2015-12-25 (×2): 10 mg via ORAL
  Filled 2015-12-24 (×2): qty 1

## 2015-12-24 MED ORDER — LACTATED RINGERS IV SOLN
INTRAVENOUS | Status: DC | PRN
Start: 2015-12-24 — End: 2015-12-24
  Administered 2015-12-24: 07:00:00 via INTRAVENOUS

## 2015-12-24 MED ORDER — DEXAMETHASONE SODIUM PHOSPHATE 4 MG/ML IJ SOLN (CUSTOM)
INTRAMUSCULAR | Status: DC | PRN
Start: 2015-12-24 — End: 2015-12-24
  Administered 2015-12-24: 10 mg via INTRAVENOUS

## 2015-12-24 MED ORDER — GLYCOPYRROLATE 1 MG/5ML IJ SOLN
INTRAMUSCULAR | Status: DC | PRN
Start: 2015-12-24 — End: 2015-12-24
  Administered 2015-12-24: 0.1 mg via INTRAVENOUS
  Administered 2015-12-24: .8 mg via INTRAVENOUS

## 2015-12-24 MED ORDER — FENTANYL CITRATE (PF) 100 MCG/2ML IJ SOLN
25.0000 ug | INTRAMUSCULAR | Status: DC | PRN
Start: 2015-12-24 — End: 2015-12-24

## 2015-12-24 MED ORDER — DEXMEDETOMIDINE HCL 200 MCG/2ML IV SOLN
INTRAVENOUS | Status: DC | PRN
Start: 2015-12-24 — End: 2015-12-24
  Administered 2015-12-24 (×3): 2 ug via INTRAVENOUS

## 2015-12-24 MED ORDER — SODIUM CHLORIDE 0.9 % IR SOLN
Status: DC | PRN
Start: 2015-12-24 — End: 2015-12-24
  Administered 2015-12-24 (×2)

## 2015-12-24 MED ORDER — OXYCODONE HCL ER 10 MG PO T12A
10.0000 mg | EXTENDED_RELEASE_TABLET | Freq: Two times a day (BID) | ORAL | Status: DC
Start: 2015-12-24 — End: 2015-12-26
  Administered 2015-12-24 – 2015-12-26 (×4): 10 mg via ORAL
  Filled 2015-12-24 (×4): qty 1

## 2015-12-24 MED ORDER — LIDOCAINE HCL 1 % IJ SOLN
0.1000 mL | INTRAMUSCULAR | Status: DC | PRN
Start: 2015-12-24 — End: 2015-12-24

## 2015-12-24 MED ORDER — DEXAMETHASONE 4 MG OR TABS
4.0000 mg | ORAL_TABLET | Freq: Once | ORAL | Status: AC
Start: 2015-12-26 — End: 2015-12-26
  Administered 2015-12-26: 4 mg via ORAL
  Filled 2015-12-24: qty 1

## 2015-12-24 MED ORDER — HYDROCHLOROTHIAZIDE 25 MG OR TABS
12.5000 mg | ORAL_TABLET | Freq: Every day | ORAL | Status: DC
Start: 2015-12-24 — End: 2015-12-26
  Administered 2015-12-26: 12.5 mg via ORAL
  Filled 2015-12-24 (×2): qty 1

## 2015-12-24 MED ORDER — NEOSTIGMINE METHYLSULFATE 10 MG/10ML IV SOLN
INTRAVENOUS | Status: DC | PRN
Start: 2015-12-24 — End: 2015-12-24
  Administered 2015-12-24: 5 mg via INTRAVENOUS

## 2015-12-24 MED ORDER — HYDROMORPHONE HCL 1 MG/ML IJ SOLN
0.5000 mg | INTRAMUSCULAR | Status: DC | PRN
Start: 2015-12-24 — End: 2015-12-24

## 2015-12-24 MED ORDER — HYDROMORPHONE HCL 1 MG/ML IJ SOLN
0.5000 mg | INTRAMUSCULAR | Status: DC | PRN
Start: 2015-12-24 — End: 2015-12-26

## 2015-12-24 MED ORDER — LANSOPRAZOLE 15 MG OR CPDR
15.0000 mg | DELAYED_RELEASE_CAPSULE | Freq: Every day | ORAL | Status: DC
Start: 2015-12-25 — End: 2015-12-26
  Administered 2015-12-25 – 2015-12-26 (×2): 15 mg via ORAL
  Filled 2015-12-24 (×2): qty 1

## 2015-12-24 MED ORDER — ASPIRIN 325 MG OR TABS
325.0000 mg | ORAL_TABLET | Freq: Two times a day (BID) | ORAL | Status: DC
Start: 2015-12-24 — End: 2015-12-25
  Administered 2015-12-24: 325 mg via ORAL
  Filled 2015-12-24: qty 1

## 2015-12-24 MED ORDER — OXYCODONE HCL 10 MG OR TABS
10.0000 mg | ORAL_TABLET | ORAL | Status: DC | PRN
Start: 2015-12-24 — End: 2015-12-26

## 2015-12-24 MED ORDER — FLEET ADULT ENEMA 7-19 GM/118ML RE ENEM
1.0000 | ENEMA | Freq: Every day | RECTAL | Status: DC | PRN
Start: 2015-12-27 — End: 2015-12-26

## 2015-12-24 MED ORDER — TRANEXAMIC ACID 1000 MG/10ML IV SOLN
1000.0000 mg | INTRAVENOUS | Status: DC | PRN
Start: 2015-12-24 — End: 2015-12-24
  Administered 2015-12-24 (×2): 1000 mg via INTRAVENOUS

## 2015-12-24 SURGICAL SUPPLY — 35 items
BANDAGE COBAN 6 STERILE LATEX FREE (Misc Medical Supply) ×2 IMPLANT
BLADE DUAL CUT SAGITTAL, D 90MM, THK 1.27MM, CE 18MM (Knives/Blades) ×2
CAUTERY TIP EDGE BLADE ELECTRODE 4.0" (Knives/Blades) ×2
COVER SUPPORT HIP GRIP (Misc Medical Supply) ×2
CUP ACETABULAR PINNACLE POROCOAT SECTOR 52MM (Cups/Liners/Heads/Shells/Body) ×2 IMPLANT
DRAPE IOBAN 2 ANTIMICRO 23X33 (Drape/Gowns/Gloves/Pack) ×2
DRAPE ULTRAGARD HIP W/CLEAR POUCHES (Drape/Gowns/Gloves/Pack) ×2
FILM IOBAN 2 ANTIMICROBIAL 23" X 33" (Drape/Gowns/Gloves/Pack) ×2
GLOVE BIOGEL INDICATOR UNDERGLOVE SIZE 8 (Gloves/gowns) ×12 IMPLANT
GLOVE SURGEON BIOGEL SIZE 7.5 (Gloves/gowns) ×36 IMPLANT
HEAD FEMORAL 36 +1.5 (Cups/Liners/Heads/Shells/Body) ×2 IMPLANT
HOOD FLYTE SURGICOOL (Gloves/gowns) ×10 IMPLANT
HOVERMATT TRANSFER MATTRESS 34 FULL -SINGLE PATIENT USE (Patient care items) ×2
LINER PINNACLE ALTRX ID36 MM X OD52 MM, NEUTRAL (Cups/Liners/Heads/Shells/Body) ×2 IMPLANT
PAD GROUND VALLEYLAB REM ADULT E7507 (Misc Medical Supply) ×2 IMPLANT
SCREW PINNACLE CAN BONE 6.5MM X 25MM (Screws/anchors/cables) ×2 IMPLANT
SKIN PREP -BATH CLOTH CHG 2% (Misc Medical Supply) ×2 IMPLANT
SLEEVE SCD KNEE MEDIUM (Misc Medical Supply) ×2
SOLUTION IRR POUR BTL 0.9% NS 1000ML (Non-Pharmacy Meds/Solutions) ×6
SOLUTION IRR POUR BTL H20 1000ML (Non-Pharmacy Meds/Solutions) ×6 IMPLANT
SPONGE ENZYMATIC SPONGE CA/80 (Misc Medical Supply) ×2
SPONGE LAP RF DETECT 18" X 18" XRAY STERILE (Dressings/packing) ×4
STEM CORAL IMPLANT ×2 IMPLANT
STOCKING KNEE LG REGULAR (Misc Medical Supply) ×2
STRIP WOUND CLOSURE 1" X 5" (Dressings/packing) ×2 IMPLANT
SURGICAL PACK TOTAL JOINT TRAY CUSTOM (Procedure Packs/kits) ×2
SUTURE ETHIBOND #5 LF (Suture) ×2 IMPLANT
SUTURE ETHILON 2-0 18" FS (Suture) ×4 IMPLANT
SUTURE MONOCRYL PLUS 2-0 27" CT-1 MCP339H (Suture) ×2
SUTURE STRATAFIX PDS PLUS 1 CTX 45CM (Suture) ×2 IMPLANT
SUTURE STRATAFIX SPIRAL 3-0 12" FS SXMD2B412 (Suture) ×2
SUTURE STRATAFIX SPIRAL PDO 2-0 20CM CT-1 SXPD1B400 (Suture) ×2 IMPLANT
SUTURE VICRYL PLUS 0 18" MO-4 CR VCP701D (Suture) ×4
SUTURE VICRYL PLUS 1 18" MO-4 CR VCP702D (Suture) ×2 IMPLANT
SYRINGE HYPO LL 30CC (Needles/punch/cannula/biopsy) ×2 IMPLANT

## 2015-12-24 NOTE — Interdisciplinary (Signed)
Physical Therapy Evaluation    Referring Physician: [name]           Inpatient    Visit Diagnoses       Codes    Left hip pain     ICD-10-CM: M25.552  ICD-9-CM: 719.45    Relevant Orders    PATHOLOGY TISSUE EXAM    Arthritis of left hip     ICD-10-CM: M19.90  ICD-9-CM: 716.95    Relevant Orders    PATHOLOGY TISSUE EXAM    Difficulty walking     ICD-10-CM: R26.2  ICD-9-CM: 719.7        Start of Care: 12/24/15  Onset date : 12/24/15  Reason for referral: Activity tolerance limitation;Decline in functional ability;Decline in functional mobility      Physical Therapy Recomendations  Physical Therapy Recommendations  Discharge Physical Therapy and equipment needs: Patient will benefit from continued skilled Physical Therapy;Patient will likely have additional Physical Therapy and equipment needs, level to be determined as patient progresses with Rehabilitation Services  Patient is appropriate for discharge to: a supervised living situation  Patient has the following deficits that may restrict safe completion of mobility activities of daily living skills: physical limitations;functional limitations that may restrict safe navigation of physical obstacles present in living environment  Patient functional limitations necessitate consideration of the following assistive device to complete mobility activities of daily living safely: Engineer, maintenance (Pt has FWW at home)    Patient Discharge Instructions  PHYSICAL THERAPY PATIENT DISCHARGE INSTRUCTIONS  Your Physical Therapist suggests the following: Continue to complete your home exercise program daily as instructed;Continue to utilize correct body mechanics when moving in and out of bed as instructed;Supervision with walking is suggested for increased safety;Continue to use your assistive device as instructed when walking to improve your stability and prevent falls  The following assistive device has been recommended for your safety: Front Wheeled Walker (Pt has  FWW at home)      Assessment   Assessment : Pt is POD 0 s/p L THA, LLE WBAT, modified posterior hip precautions. Pt overall close CGA for functional transfers and gait training with a FWW. Able to ambulate 200' with a FWW, demo's slow cadence, decreased stance time on LLE, decreased stride lengths, moderate UE support on FWW. Pt demo's good safety awareness with FWW management, able to adhere to hip precautions throughout treatment session. Pt educated on role of PT, POC, safety when mobilizing, BLE HEp (handout given) and importance of frequent OOB activity to promote better functional recovery. Pt will benefit from 1-2 additional PT sessions to assess compliance with hip precautions, safety, gait mechanics and increase activity tolerance. Once medically cleared, anticipate pt safe to D/C home with husband to assist and HHPT for continued therapy. Patient has own recommended DME at home. Thank you for the consult     Rehab Potential: Excellent    Plan  The plan of care was developed in conjunction with the patient's goals. It was reviewed with the patient, including a review of the physical findings, proposed treatment, frequency and duration of treatment sessions, precautions, limitations and expected outcomes. The patient acknowledged understanding of all of the above and agreed to the treatment plan as stated.    Patient Goals :To walk    Therapy Goals  Current Level Goals for Episode of Care    Functional Deficit Difficulty walking   Long term Functional Goal  Functional deficit summary: Difficulty walking  Goal #3 (Long Term) : Pt will be able to amb > 200' with LRAD at supervisory level  No. visits: 3-5  Goal status: New   Impairment #1 Gait Short Term Impairment Goal #1 Impairment : Gait  Gait goals: To promote household mobility, patient able to walk with a reciprocal gait pattern with the least restrictive assistive  device for at least 100 feet with  Assitance : modified independence  No. visits: 3-5  Goal status: New   Impairment #2 Functional Mobility Short Term Impairment Goal #2 Impairment : Functional Mobility  Custom goal: Pt will be SPV for all functional transfers with LRAD  No. visits: 3-5  Goal status: New   Functional Limitation Reporting    Visit type: Intial ongoing  Impairment Category: Mobility  Mobility: Walking and Moving Around Current Status [phone]): CK 40%-59% impaired, limited or restricted  Mobility: Walking and Moving Around Goal Status [phone]): CI 1%-19% impaired, limited or restricted  Functional Assessment Tool  Rationale: Acceptable functional assessment;Clinical judgement  Functional Assessment Tool: AMPAC     Treatment Plan and Rationale  Gait Training: to improve gait pattern;to improve safety while ambulating with assistive device;to improve safety with community ambulation;to improve safety with household ambulation  Patient Education: to improve energy conservation measures during functional activities;to improve compliance with independent home exercise program;to increase independence in functional activities;to increase independence with correct body mechanics technique during functional activities;to increase safety during dynamic activities  Theraputic Activities: to imrpove sitting tolerance;to improve standing tolerance;to improve navigating home and/or community  Treatment Plan  Include in My Healthcare: Myself  Treatment Plan Discussion & Agreement: Patient;Family (Patient's husband present in room)  Patient/Family Questions: Yes - All questions asked & answered  Patient/Family Teaching: Ongoing  Preferred Learning Method: Verbal instructions;Demonstration;Printed material  Focus for Next Treatment: Functional mobility training;Gait training;Patient/Family training;Patient exercise program instruction;Safety instruction;Therapeutic activities;Therapeutic exercise     Inpatient Treatment  Frequency: 1-2 times daily  Inpatient Treatment Duration: 3-5 visits    Patient History   Medical History  History of presenting condition: 70YO F who is POD 0 s/p L THA, LLE WBAT, modified posterior hip precautions  Previous treatment for condition: None  Mechanism of Injury: Insidious onset  Past medical/surgical history affecting therapy : Previous surgeries (R THA in 2011, L TKA in 2015 )  Inpatient Precautions / Contraindications: Posterior hip precautions;Weight bearing restrictions;Fall risk (Modified)  Left Lower Extremity : Weight bearing as tolerated  Past Medical History   Diagnosis Date   . Arthritis of knee      Left > Right   . Dyslipidemia    . GERD (gastroesophageal reflux disease)    . HTN (hypertension)    . Hypothyroidism    . Left Kidney carcinoma 08/21/2013 sx     s/p Left nephrectomy 08/21/2013: no CHEMO/XRT   . Osteoarthritis of hip      Left and s/p R THR         General Health Screen   Symptoms present in the last 6 months : None  Fall history: No reported falls in the last 6 months    Functional History  Prior Level of Function: No deficits;Independent with mobility  Equipment Present/Required: Catheter;Telemetry  Functional Mobility Assistance Needs: None  Assistive Device for Mobility: None;Other (Comment) (Pt owns a FWW, 4WW, SPC at home)  Self-Care Assistance  Needs: None  Self Care Assistive Device(s): None    Safety Measures  Functional Mobility Assistance Needs: None  Assistive Device for Mobility: None;Other (Comment) (Pt owns a FWW, 4WW, SPC at home)  Self-Care Assistance Needs: None  Self Care Assistive Device(s): None    Social History  Living Situation: Lives with parent/family  Home Environment: Multi-story home  Home accessibility : Multi-level;Able to live on main level with bedroom/bathroom;Walkin shower  Number of stairs to enter home: 0  Number of stairs in home: 0   Preferred Language:English    Subjective    INPATIENT PAIN SCALES    Numeric Pain Rating Scale (must address  all rows)  Pain Intensity - rating at present: 5  Pain Intensity - rating at worst : 5  Pain Intensity- rating after treatment: 0  Frequency : Intermittent  Description : Aching  Location : Over incision;Right lower extremity Buttock        Objective    ACTIVITY TOLERANCE AND CARDIOPULMONARY  Activity Tolerance  Activity Tolerance: Activity Tolerance General  General Activity tolerance (row-multiselect): Good- tolerates 75-100% of treatment session      EXTREMITY ASSESSMENT GENERAL  Flexibility, strength and tone grossly within functional limits throughout      FUNCTIONAL MOBILITY  Functional Mobility - General  Bed mobility: Close contact guard assist  Transfers sit to/from stand : Close contact guard assist  Transfers stand to/from squat : Close contact guard assist  Gait: Close contact guard assist         Gait   Functional gait assessment: Exceptions to [facility]  Independence Level : Supervised- added since you began the build  Assistive device used: Glass blower/designer Ambulated Today: 200  Ambulation Tolerance: Limited community distances (<500 ft)  Terrain Management: Level surfaces Needs assist to navigate safely  General gait  description: Exceptions to [name]  Gait Description Global pattern: Cadence decreased;Decreased left lower extremity weight bearing;Stride length shortened     ,     POSTURAL CONTROL AND BALANCE         Static Sitting Balance  Static sitting balance - control: Good  Static sitting balance - support: No upper extremity support  Static sitting balance - level of assistance: Independent    Dynamic Sitting Balance  Dynamic sitting balance - control: Good  Dynamic sitting balance - support: No upper extremity support  Dynamic sitting balance - level of assistance: Independent    Static Standing Balance  Static standing balance - control: Good;With assistive device  Static standing balance - support: Bilateral upper extremities supported  Static standing balance - level of  assistance: Contact guard assist    Dynamic Standing Balance  Dynamic Standing Balance - control: Good;With assistive device  Dynamic Standing Balance - support: Bilateral upper extremities supported  Dynamic Standing Balance - level of assistance: Contact guard assist,        and   ACTIVITY MEASURE FOR POST-ACUTE CARE (AMPAC SCORE)  AMPAC  Difficulty with turning over in bed: None  Difficulty with sit to stand transfer from chair with arms: A little  Difficulty with supine to sit transfer: A little  How much help needed to move to/from bed to chair: A little  How much help needed to walk in room: A little  How much help needed to climb 3-5 steps with a rail: Unable  AMPAC Total Score: 17  Assessment: AMPAC-Current functional impairment level: CK: 40-59% impaired      Treatment Today  Therapeutic Procedures  Gait Training [phone]): Assistive device training;Dynamic activities while walking;Gait pattern analysis and treatment of deviations;Gait training with varying resistance or speed;Patient education;Postural alighnment/biomechanic training during gait     Total TIMED Treatment (min) : 15  Therapeutic Activities [phone]) : Assistance/facilitation of bed mobility;Dynamic activities to improve performance of  functional tasks/activities;Facillitation of safety awareness/responses during functional tasks;Functional activities;Fraded physical assist for functional activities;Patient education;Progressive mobilization to improve functional independence;Transfer training with weight shift and direction change     Total TIMED Treatment (min) : 15                Treatment Time   Treatment start time: 1430  Total TIMED Treatment  (min): 30  Total Treatment Time (min): 45

## 2015-12-24 NOTE — Brief Op Note (Cosign Needed)
ORTHOPAEDIC SURGREY BRIEF OPERATIVE NOTE    DATE: 12/24/15     TIME: 10:38 AM    PREOPERATIVE DIAGNOSIS: left hip OA    POSTOPERATIVE DIAGNOSIS: left hip OA    PROCEDURE: left THA    ATTENDING SURGEON: Ball  ASSISTANTS(s): Lindi Adie, [place]    ANESTHESIA: GETA + SPINAL    FINDINGS: severe hip OA    WOUND CLASSIFICATION:  Class I (clean)    WOUND CLOSURE STATUS:  All layers of surgical incision (deep and superficial) were fully closed.    EBL: 200cc  IVF: 1000cc  UOP: 400cc    DRAINS: NA    TOURNIQUET TIME: none    IMPLANTS:    Implant Name Type Inv. Item Serial No. Manufacturer Lot No. LRB No. Used   CUP PINNACLE 52MM - [id number] Cups/Liners/Heads/Shells/Body CUP PINNACLE 52MM  [employer] [id number] Left 1   SCREW CANCELLOUS PINNACLE 6.5 X 25MM - [id number] Screws/anchors/cables SCREW CANCELLOUS PINNACLE 6.5 X 25MM  [employer] [id number] Left 1   LINER PINNACLE - [id number] Cups/Liners/Heads/Shells/Body LINER PINNACLE  [employer] [id number] Left 1   STEM CORAL IMPLANT - [id number]  SIZE 11  STEM CORAL IMPLANT  [employer] [id number] Left 1   HEAD FEMORAL 36 +1.5 - [id number] Cups/Liners/Heads/Shells/Body HEAD FEMORAL 36 +1.5   [employer] [id number] Left 1       SPECIMENS:  Cut bone    COMPLICATIONS:  None    DISPOSITION: Stable to PACU    ATTESTATION: Dr. Diona Foley was present and scrubbed for the entirety of the procedure and Bezrukov served as first Environmental consultant as no qualified level resident was available for the procedure.    PLAN:  - Analgesia: transition to oral, pathway  - Activity: WBAT LLE, PHP  - Anticoagulation: lovenox transition to ASA if mobile otherwise Xarelto  - Antibiotics: 2g ancef x 2  - F/U AM CBC  - F/U: 4 weeks postop check

## 2015-12-24 NOTE — Plan of Care (Signed)
Problem: Day of Surgery  Goal: Pain management is by multi-modal methods  Outcome: Goal Met  Monitor pain level Q4h and prn.  Assess pain level using 0-10 pain scale.  Discuss pain management with pt and administer medications as ordered. Assist pt with repositioning prn for comfort.  Reassess pt following administration of pain medications/ comfort measures for effectiveness.   Monitor respiratory rate and LOC.  Pt is A&OX4.  Pt medicated for left hip pain; please see MAR for time and dose of administration.   Pt verbalizes an understanding to call nurse for pain meds as needed.  Will continue to monitor pain level and to update as needed.    Goal: Absence of complications  Outcome: Goal Met  Seen and examined pt PT; pt up at bedside chair; performed pedicycle; with SCDs,    Intervention: Infection Prevention: Administered and documented routine antibiotics as ordered  Will administer scheduled antibiotics when available

## 2015-12-24 NOTE — Progress Notes (Signed)
Orthopaedic Joint Surgery Progress Note    Events:  Transferred to PACU.    Subjective:  Doing well. Pain well controlled. No complaints.    Objective:  BP 100/81  Pulse 64  Temp 97.3 F (36.3 C)  Resp 14  Ht 5\' 7"  (1.702 m)  Wt 116.8 kg (257 lb 9.6 oz)  SpO2 94%  BMI 40.35 kg/m2    Intake/Output Summary (Last 24 hours) at 12/24/15 1218  Last data filed at 12/24/15 1036   Gross per 24 hour   Intake             1000 ml   Output              670 ml   Net              330 ml       NAD  NLB    LLE  Dressing c/d/i  Hip soft, no hematoma  SILT s/s/sp/dp/t  5/5 TA/GSC/EHL/FHL  WWP, BCR, PT/DP 2+    Labs:  None pending    Imaging:  Postop XR show implant in appropriate alignment without evidence of complication    Assessment:  70 year old F s/p L THA 12/24/15 doing well.    Plan:  - Analgesia: transition to oral, pathway  - Activity: WBAT LLE, PHP  - Anticoagulation: lovenox transition to ASA if mobile otherwise Xarelto  - Antibiotics: 2g ancef x 2  - F/U AM CBC  - F/U: 4 weeks postop check  - Disposition: planning; anticipate DC home POD2 with HHPT  - Orthopaedic joint team will follow. Please page 1st call with questions.

## 2015-12-24 NOTE — H&P (View-Only) (Signed)
Anesthesia Consult/Preoperative History & Physical Exam    C/C:  Preoperative evaluation for upcoming surgery/procedure under anesthesia.     Referring Physician: [name]  Primary Care Physician: Katheren Puller     HPI:  Angela Calhoun is a 70 year old female who was seen by preoperative clinic for evaluation prior to elective ARTHROPLASTY, LT HIP  Patient is scheduled on DOS: 12/24/15  at [facility]  Current symptoms include left hip arthritis, and associated pain    ANESTHESIA PRE-OPERATIVE EVALUATION    Patient Information    Name: Angela Calhoun    MRN: [id number]    DOB: Jan 16, 1946    Age: 70 year old    Sex: female  Procedure(s):        BP 132/70  Pulse 73  Temp 97.4 F (36.3 C) (Oral)  Resp 16  Ht 5' 5.5" (1.664 m)  Wt 116.4 kg (256 lb 9.9 oz)  SpO2 98%  BMI 42.05 kg/m2   BMI kg/m2: 42.05 kg/m2    Primary language spoken:  English    ROS/Medical History:  General Review & History of Anesthetic Complications:   Patient summary reviewed   No history of anesthetic complications, No PONV, No history of difficult intubation and no family history of anesthetic complications     Slow to emerge from gallbladder surgery with geta ~2005  Cardiovascular:    Exercise tolerance: >4 METS (Can go up flight of stairs w/o cp can walk a block    walking w/o assistance )  (+)  hypercholesterolemia  hypertension well controlled        (-) angina, DOE, cardiac stents    ECG reviewed  ROS comment: Cleared for surgery as low risk per her Dr   Marthann Schiller nsr 08/2015    Pulmonary:  - negative ROS   (+) recent URI, , , , ,   (-) COPD, asthma, shortness of breathSleep apnea: snores and has htn - high risk for osa  Hematology/Oncology:   - negative ROS  (-)  chemotherapy and radiation treatment    ROS Comments + hx L kidney cancer, stage 1 per pt report: s/p lap Left nephrectomy: 08/21/2013. No Chemo/xrt.   Neuro/Psych:   - negative ROS       (-) seizures, CVA, numbness, weakness   Infectious Disease:   Negative ROS         Endo/Other:        (+) hypothyroidism, arthritis  (-) diabetes mellitus, hyperthyroidism, blood dyscrasia, no back pain    ROS Comments: Left hip oa    sp rt hip and left knee replacement  GI/Hepatic:     (+) GERD well controlled,   (-) hepatitis, liver disease, renal disease   Renal:         No chronic renal disease, Electrolyte acid base abnormality and IV Contrast in last 24-48 hours  ROS comments Sp nephrectomy 2/2 rcc   Had ua sample done Monday by urologist at [facility]  - was okay - had cx done will fax results to dr ball   No urinary sx's    last uti treated 1 week ago  Pregnancy History:  Para: 0  Gravida: 0  Currently pregnant? no       [facility]) Notes:  no beta blocker therapy  [facility] Test & records reviewed by [facility] provider  [facility] Echo/ECG/Angio test (EKG: 11/19/2013: NSR, 68 BPM, nl EKG. (See OSH record in EMR)) available.  [facility] [place] labs available.  T/s ,urine,esr   Reviewed avs :  :    Studies ordered by [facility] practitioner: CBC, COAGs and Hematology    T/s ,urine,esr   Reviewed avs :    Physical Exam    Airway:  Inter-inciser distance 3-4 cm  Prognanth Able    Mallampati: III  Neck ROM: full  TM distance: 5-6 cm  Short thick neck: No    Cardiovascular:  - cardiovascular exam normal   Rhythm: regular   Rate: normal     Negative for murmur     Pulmonary:  - pulmonary exam normal      breath sounds clear to auscultation        Neuro/Neck/Skeletal/Skin:  - [place] ANE PHYS EXAM NEGATIVE ROS SKIN SKELETAL NEURO NECK    Dental:  - normal exam    Abdominal:   - normal exam     General: obesity     Additional Clinical Notes:     Has a physician diagnosed you with sleep agnea?: No  Do you use a CPAP at home?: No       Past Medical History   Diagnosis Date   . Arthritis of knee      Left > Right   . Dyslipidemia    . GERD (gastroesophageal reflux disease)    . HTN (hypertension)    . Hypothyroidism    . Left Kidney carcinoma 08/21/2013 sx     s/p Left nephrectomy 08/21/2013: no CHEMO/XRT   . Osteoarthritis of hip       Left and s/p R THR     Past Surgical History   Procedure Laterality Date   . Cholecystectomy, lap  2008   . 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     . Pb remove tonsils/adenoids,<13 y/o     . Pb biopsy of breast, incisional  1990's   . Knee i&d  1980's     Traumatic Injury to Knee    . Pb laparoscopy radical nephrectomy Left 08/21/2013     Cancer of Left kidney   . Colonoscopy     . S/p right thr  2011     GA per pt report with REG   . Sp arthroplasty left  total knee  2015     Social History   Substance Use Topics   . Smoking status: Former Smoker     Packs/day: 2.00     Years: 12.00     Types: Cigarettes     Quit date: 12/24/1980   . 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 Alcohol use No       Current Outpatient Prescriptions   Medication Sig Dispense Refill   . Acetaminophen (TYLENOL PO) Take 650 mg by mouth 4 times daily as needed.     . Cholecalciferol (VITAMIN D3) 3000 UNITS tablet Take 1 tablet by mouth daily. Taking  1000 iu     . Cranberry 300 MG TABS Bid     . levothyroxine (LEVOXYL) 137 MCG tablet Take 137 mcg by mouth every morning.     Marland Kitchen lisinopril-hydrochlorothiazide (PRINZIDE, ZESTORETIC) 10-12.5 MG per tablet Take 1 tablet by mouth every morning.     . multivitamin (CENTRUM SILVER) tablet Take 1 tablet by mouth daily.     Marland Kitchen omeprazole (PRILOSEC) 20 MG capsule Take 20 mg by mouth every morning.     . Probiotic  Product (PROBIOTIC DAILY PO)      . simvastatin (ZOCOR) 10 MG tablet Take 10 mg by mouth every evening.       No current facility-administered medications for this visit.      Allergies   Allergen Reactions   . Dermabond [Other] Rash   . Adhesive Tape Rash       Labs and Other Data  Lab Results   Component Value Date    NA 137 01/07/2014    K 4.3 01/07/2014    CL 104 01/07/2014    BICARB 23 01/07/2014    BUN 19 01/07/2014    CREAT 0.95 01/07/2014    GLU 122 01/07/2014    [place] 9.3 01/07/2014     No results  found for: AST, ALT, GGT, LDH, ALK, TP, ALB, TBILI, DBILI  Lab Results   Component Value Date    WBC 12.2 01/07/2014    RBC 4.28 01/07/2014    HGB 11.6 01/07/2014    HCT 36.1 01/07/2014    MCV 84.3 01/07/2014    MCHC 32.1 01/07/2014    RDW 15.3 01/07/2014    PLT 160 01/07/2014    MPV 10.3 01/07/2014    SEG 59 12/24/2013    LYMPHS 32 12/24/2013    MONOS 7 12/24/2013    EOS 2 12/24/2013     Lab Results   Component Value Date    INR 1.0 12/24/2013          Labs pending     Anesthesia Plan:  ASA 2 (Mild systemic disease)        Planned monitoring method: Routine monitoring      Comments: (Pt educated about regional/epidural/spinal anesthesia.  R&B reviewed Questions addressed.  Handout given to patient in [facility].)            Diagnosis:   Encounter Diagnoses   Name Primary?   . Preop examination Yes       EKG: nsr 08/2015     Anesthesia Assessment:  SHARNAY CASHION is a 70 year old female with history as stated above presenting to preop clinic for evaluation prior to elective surgery.    -Preoperative history and physical completed today.       Plan/Recommendations:  -RAMLA HASE is low risk for an intermediate risk elective surgery  And can proceed with surgery as planned.   -Pre Op Tests Ordered:cbc, cmp, t/s, esr,pt,ptt a1c  -Patient provided Written Pre Op Instructions (See EPIC Notes/Trans=Pt Instructions)  -Risks/Benefits of Anesthesia discussed, Dental risks explained    Watt Climes , MSN, FNP-C  [facility]  Tel: [phone]

## 2015-12-24 NOTE — Plan of Care (Signed)
Problem: Alteration in tissue perfusion related to anesthesia, arrhythmia, or fluid volume status  Goal: Blood pressure and pulse are within 20 points of preoperative value, skin is warm and dry, urine output is within parameters ordered by physician, and orientation is as per preoperative status  Intervention: Monitor vital signs as per unit protocol and as indicated  Patient will maintain o2 saturation about 94%       Problem: Alteration in comfort related to pain  Goal: Patient will verbalize or exhibit feelings of comfort and/or decrease in pain, vital signs will remain within parameters ordered by physician/ provider  Pt will verbalize any feelings of pain or discomfort. Pain will be managed with prescribed medications

## 2015-12-24 NOTE — Discharge Summary (Signed)
Patient Name:  Angela Calhoun    Principal Diagnosis (required):  Left Hip Arthritis    Hospital Problem List (required):  Left Hip Arthritis  Patient Active Problem List   Diagnosis   . Osteoarthritis of hip   . Hypertensive disorder   . Gastroesophageal reflux disease   . Hypothyroidism   . S/P prosthetic total arthroplasty of the hip   . Dyslipidemia   . HTN (hypertension)   . GERD (gastroesophageal reflux disease)   . Arthritis of knee   . Kidney carcinoma (CMS-HCC)   . Hip osteoarthritis       Additional Hospital Diagnoses ("rule out" or "suspected" diagnoses, etc.):  None    Principal Procedure During This Hospitalization (required):  Left Total Hip Arthroplasty performed on 12/24/15    Other Procedures Performed During This Hospitalization (required):  None    Consultations Obtained During This Hospitalization:  Regional anesthesia  PT  OT  CM    Key consultant recommendations:  Regional pain management  Physical therapy regimen  Discharge disposition    Reason for Admission to the Hospital / History of Present Illness:  Angela Calhoun is a 70 year old female presenting with chronic left hip pain requesting a THA. She has previously undergone a R THA (03/2010) and L TKA (01/2014) by Dr. Diona Foley. The patient has a long history of osteoarthritis of the left hip. She states that her pain has been progressively worsening. The pain is worse with activity and improves with rest. She now has trouble with ADLs and cannot walk more than a few blocks before having to rest secondary to pain. She takes tylenol with moderate pain relief. Also complaining of a long history of right knee pain. Her knee is starting to feel unstable. Wants to get her left THA before she considers management of her right knee. Has a history of renal carcinoma with left nephrectomy in 2014, prior to her last TKA. States her follow-ups have shown no recurrence of the cancer.    Hospital Course by Problem (required):  The patient was admitted  and taken to the operating room for the planned procedure.  The hip arthroplasty proceeded uneventfully and without complication - please see operative details for report.  Post-operatively the patient was admitted to the ward for recovery.  The post-operative plan was consistent with the hip arthroplasty pathway, which included the following:  Foley removal on POD#1  ASA325mg  BID x 4 weeks for DVT prophylaxis  PO pain medication  24 hours of prophylactic antibiotics  Working with PT, hip precautions, and use of a pedicycle three times daily    A hemogram was also performed and was consistent with expected acute blood loss anemia; the patient remained asymptomatic and stable throughout the hospitalization.     The patient was walking by POD#1 and by POD#2 pain was well controlled, the patient was urinating without difficulty, and tolerating a PO diet.  Patient was ready for Discharge on POD#2 with HHPT and a walker.  Plan is for follow-up in 4 weeks in clinic.    Tests Outstanding at Discharge Requiring Follow Up:  None    Discharge Condition (required):  Stable    Key Physical Exam Findings at Discharge:  Patient is A/O x 3.  Thigh is soft and dressings are clean and dry.  Intact motor throughout the lower extremity with 5/5 strength and sensation intact.    Discharge Diet:  Regular.    Discharge Medications:     What To  Do With Your Medications      START taking these medications       Add'l Info    aspirin 325 MG tablet   Take 1 tablet (325 mg) by mouth 2 times daily.    Quantity:  56 tablet   Refills:  0       celecoxib 100 MG capsule   Commonly known as:  CELEBREX   Take 1 capsule (100 mg) by mouth every 12 hours.    Quantity:  60 capsule   Refills:  0       docusate sodium 100 MG capsule   Commonly known as:  COLACE   Take 1 capsule (100 mg) by mouth 2 times daily.    Quantity:  60 capsule   Refills:  0       * oxyCODONE 10 MG Controlled-Release tablet   Commonly known as:  OXYCONTIN   Take 1 tablet (10 mg) by  mouth every 12 hours.    Quantity:  60 tablet   Refills:  0       * oxyCODONE 5 MG immediate release tablet   Commonly known as:  ROXICODONE   Take 1-2 tablets (5-10 mg) by mouth every 4 hours as needed for Moderate Pain (Pain Score 4-6) or Severe Pain (Pain Score 7-10).    Quantity:  100 tablet   Refills:  0       senna 8.6 MG tablet   Commonly known as:  SENOKOT   Take 1 tablet (8.6 mg) by mouth daily.    Quantity:  60 tablet   Refills:  0       * Notice:  This list has 2 medication(s) that are the same as other medications prescribed for you. Read the directions carefully, and ask your doctor or other care provider to review them with you.      CHANGE how you take these medications       Add'l Info    acetaminophen 325 MG tablet   Commonly known as:  TYLENOL   Take 3 tablets (975 mg) by mouth every 6 hours as needed for Mild Pain (Pain Score 1-3).    Quantity:  120 tablet   Refills:  3   What changed:    - medication strength  - how much to take  - when to take this  - reasons to take this         CONTINUE taking these medications       Add'l Info    Cranberry 300 MG Tabs   Bid    Refills:  0       LEVOXYL 137 MCG tablet   Take 137 mcg by mouth every morning.   Generic drug:  levothyroxine    Refills:  0       lisinopril-hydrochlorothiazide 10-12.5 MG tablet   Commonly known as:  PRINZIDE, ZESTORETIC   Take 1 tablet by mouth every morning.    Refills:  0       multivitamin tablet   Take 1 tablet by mouth daily.    Refills:  0       omeprazole 20 MG capsule   Commonly known as:  PRILOSEC   Take 20 mg by mouth every morning.    Refills:  0       PROBIOTIC DAILY PO    Refills:  0       simvastatin 10 MG tablet   Commonly known as:  ZOCOR   Take 10  mg by mouth every evening.    Refills:  0       vitamin D3 3000 UNITS tablet   Take 1 tablet by mouth daily. Taking  1000 iu    Refills:  0            Where to Get Your Medications      These medications were sent to [facility]  [address]  [id number], [place] [address]    Hours:  Mon-Fri 8:30am-7:00pm, Sat-Sun 9am-5:00pm Phone:  [phone]    . acetaminophen 325 MG tablet   . aspirin 325 MG tablet   . celecoxib 100 MG capsule   . docusate sodium 100 MG capsule   . senna 8.6 MG tablet         Please check with staff for printed prescription or if prescription was faxed to your pharmacy.     Bring a paper prescription for each of these medications    . oxyCODONE 10 MG Controlled-Release tablet   . oxyCODONE 5 MG immediate release tablet             Allergies:  Dermabond [other] and Adhesive tape    Discharge Disposition:  Home with home care services:  home physical therapy.    Discharge Code Status:  Full code / full care  This code status is not changed from the time of admission.    Follow Up Appointments:    Scheduled appointments:  Future Appointments  Date Time Provider [place]   01/26/2016 1:40 PM [name], MD [facility]   04/26/2016 10:40 AM [name], MD [facility]       For appointments requested for after discharge that have not yet been scheduled, refer to the Post Discharge Referrals section of the After Visit Summary.    Discharging 49 Contact Information:  [facility] operator at [phone].

## 2015-12-24 NOTE — Telephone Encounter (Signed)
Urine culture results from [facility] received and uploaded under media tab.

## 2015-12-24 NOTE — Op Note (Signed)
ORTHOPAEDIC OPERATIVE REPORT  DATE OF SURGERY: 12/24/2015     PREOPERATIVE DIAGNOSIS: LEFT hip osteoarthritis.  POSTOPERATIVE DIAGNOSIS: LEFT hip osteoarthritis    PROCEDURE: LEFT total hip arthroplasty.  SURGEON/STAFF: Loretha Brasil, MD   ASSISTANT:Dr Evern Bio (fellow), Dr Birdena Crandall, PGY-4, Margret Chance (PA-C)    PLEASE NOTE: THERE WAS NO RESIDENT WITH SUFFICIENT LEVEL OF TRAINING AVAILABLE TO ASSIST, Evern Bio, MD SHOULD BE LISTED AS THE PRIMARY SCRUBBED ASSISTANT.    ANESTHESIA: Spinal + GETA  COMPLICATIONS: None.  ESTIMATED BLOOD LOSS: 200 mL.  FLUIDS: 1000 mL crystalloid.    IMPLANTS: I used DePuy Pinnacle cup size 52 mm with one 13m screw. Acetabular liner is a polyethylene 32-mm inner diameter liner. Femoral stem is a Corail stem, size 11, coxa vara. Femoral head is a 32 -mm metal head with +1.5 neck length.      Implant Name Type Inv. Item Serial No. Manufacturer Lot No. LRB No. Used   CUP PINNACLE 52MM - [id number]/Liners/Heads/Shells/Body CUP PINNACLE 52MM  [employer] [id number] 1   SCREW CANCELLOUS PINNACLE 6.5 X 25MM - [id number]/anchors/cables SCREW CANCELLOUS PINNACLE 6.5 X 25MM  [employer] [id number] 1   LINER PINNACLE - [id number]/Liners/Heads/Shells/Body LINER PINNACLE  [employer] [id number] 1   STEM CORAL IMPLANT - [id number] STEM CORAL IMPLANT  [employer] [id number] 1   HEAD FEMORAL 36 +1.5 - [id number]/Liners/Heads/Shells/Body HEAD FEMORAL 36 +1.5   [employer] [id number] 1       INDICATIONS FOR SURGERY: Ms SCrocketis a very pleasant 70year old female. The patient is now suffering with end-stage arthritis of of the LEFT hip. She had undergone previously RIGHT total hip arthroplasty and was quite happy with the results.  We discussed the treatment options and risks and benefits of hip replacement. The patient now has progressed to severe joint pain with significant limitation in activity due to this pain. Patient is unable to walk unlimited without some  form of support due to pain. Patient has difficulty changing positions or going up or down stairs due to severe pain. The patient also has night pain and pain that affects activities of daily living.    The patient has been educated on extensive conservative treatment including activity modifications, weight loss, physical therapy, bracing, oral medications and injections. The patient has tried and failed or refused all conservative treatment options. NSAIDs were not effective or discontinued due to GI or cardiovascular risk profile. Physical therapy has not improved symptoms. Injections were offered and failed to improve symptoms or function. Despite these measures pain has increased and function is decreasing.    The patient currently has severe pain with all passive range of motion of the joint. There is notable limitation in the range of motion of the joint with active and passive range of motion. The patient has an antalgic gait pattern due to pain and joint dysfunction.  Recent radiographs of the hip have been reviewed and demonstrate severe degenerative joint disease with joint space narrowing, osteophyte formation and subchondral sclerosis.    The patient is an excellent candidate for joint replacement due to increased pain, declining function, physical examination and radiographic evidence of severe osteoarthrtis and failure of conservative measures.     The patient is very well informed and understands the risks. I answered all questions and the patient wished to proceed with the surgery.    PROCEDURE IN DETAIL: The patient was met in the preoperative area, marked at  the LEFT lower extremity, brought back to the operating room, transferred to the table. Spinal anesthesia was induced followed by a general anesthetic for airway control. The patient received prophylactic antiobiotics prior to surgery and was rolled into the right lateral decubitus position with the left side up, held in this position with a  hipGRIP. An axillary roll was placed. All bony prominences were padded.    We then prepped and draped the LEFT lower extremity in the usual sterile fashion and made a skin incision centered over the greater trochanter. This was about 6 inches in length, carried dissection sharply down to the level of the fascia. The fascia was incised in line with the skin incision. A Charnley retractor was placed deep to the fascia. I followed the standard posterior approach to the hip, released the piriformis exposing the posterior hip capsule, and made a posterior capsulotomy. I checked leg lengths at the level of the knee and then dislocated the hip.    Next, I made a femoral neck cut just above the lesser trochanter, matching my preoperative templating for restoration of leg length and offset. After making the neck cut, I removed the femoral head from the wound and then prepared the femur starting with a cookie-cutter and rongeur followed by canal finder, followed by broach. I broached sequentially up in size to a size 11 broach, which seated very stably in the proximal femur. I then exposed the acetabulum, removed the acetabular labrum and the contents of the acetabular fossa, and then proceeded to ream the acetabulum starting with a 47-mm reamer, reaming sequentially up in size to a 51-mm reamer. This got nice circumferential bleeding bed of bone. There was a very nice fit and fill of the acetabulum. Therefore, I opted for the 52-cup impacted this into the pelvis in a position of approximately 45 degrees of abduction and 20 degrees of anteversion. The cup seated stably. I placed one bone screw for supplemental fixation. I was happy with the position and therefore then placed the acetabular liner, and then went on to trial with various head and neck segments.    Based off of my preoperative templating, the leg length and offset would be best restored with a standard neck and a +1.5 mm ball. With this construct in place, the  hip was extremely stable with excellent impingement free range of motion. Leg lengths felt equal at the level of the knee. I therefore opted for these implants.    I dislocated the hip, removed the trials, and then placed the actual size 11 stem into the femur, and then opened the ball, impacted this onto the neck of the prosthesis, and then reduced hip, irrigated and closed. The capsule was closed with #1 Vicryl. Piriformis was brought back to the proximal femur with #5 Ti-Cron. Fascia was closed with combination #1 Vicryl and #2 Quill, Dermis with 0 Vicryl, subcutaneous layer with 2-0 Vicryl, skin with 2-0 Quill in a running subcuticular fashion, 2-0 Nylon horizontal mattress for take the tension off the skin due to patient's habitus followed by silver-based Mepilex dressing.    The patient was then returned to the supine position, and transferred to the recovery room in stable condition.    POSTOPERATIVE PLAN: Pathway with weightbearing as tolerated, modified hip precautions, Lovenox for DVT prophylaxis per AAOS guidelines, Ancef for infection prophylaxis.    Dr. Loretha Brasil was present and scrubbed for the entirety of the case.

## 2015-12-24 NOTE — Anesthesia Procedure Notes (Signed)
Epidural/Spinal Block Procedure Note  Date/Time:  Date & Time: 12/24/2015 7:20 AM   Universal Protocol:  Universal Protocol: Verbal consent obtained, risks and benefits discussed, patient states understanding of the procedure being performed, the patient's understanding of the procedure matches consent given, procedure consent matches procedure scheduled, relevant documents present and verified, test results available and properly labeled, site marked, imaging studies available, required blood products, implants, devices, and special equipment available and Immediately prior to procedure a time out was called to verify the correct patient, procedure, equipment, support staff and site/side marked as required  Consent given by: patient  Patient identity confirmed by: verbally with patient and arm band   Procedure:    Procedure type: Spinal block          Interspace: L4-5    Needle gauge used to anesthetize site: 22G  Needle gauge used to perform procedure: 22g  Insertion attempts: Multiple  no Ane Parasthesia Yes/No    epidural CSF        Test dose: none         Comments:

## 2015-12-24 NOTE — Interval H&P Note (Signed)
HISTORY & PHYSICAL - INTERVAL ASSESSMENT    **ONLY TO BE USED IN ADDITION TO A HISTORY & PHYSICAL**    Angela Calhoun  [id number]      This interval assessment is required for History & Physical completed less than 30 days but more than 24 hours prior to the admission or surgery. A History & Physical completed more than 30 days prior to the admission or surgery must be repeated.    Current Medical Status:  Unchanged    Medications / Allergies:  Unchanged    Review of Systems:  Unchanged    Physical Examination:  I have examined the patient today.  Unchanged    Laboratory or Clinical Data:  Unchanged    Modifications of Initial Care Plan:  Unchanged       The patient now has progressed to severe joint pain with significant limitation in activity due to this pain. Patient is unable to walk unlimited without some form of support due to pain. Patient has difficulty changing positions or going up or down stairs due to severe pain. The patient also has night pain and pain that affects activities of daily living.   The patient has been educated on extensive conservative treatment including activity modifications, weight loss, physical therapy, bracing, oral medications and injections. The patient has tried and failed or refused all conservative treatment options. NSAIDs were not effective or discontinued due to GI or cardiovascular risk profile. Physical therapy has not improved symptoms. Injections were offered and failed to improve symptoms or function. Despite these measures pain has increased and function is decreasing.     The patient currently has severe pain with all passive range of motion of the joint. There is notable limitation in the range of motion of the joint with active and passive range of motion. The patient has an antalgic gait pattern due to pain and joint dysfunction.   Recent radiographs of the b/l hips have been reviewed and demonstrate severe degenerative joint disease with joint space narrowing,  osteophyte formation and subchondral sclerosis.     The patient is an excellent candidate for joint replacement due to increased pain, declining function, physical examination and radiographic evidence of severe osteoarthrtis and failure of conservative measures.         Angela Calhoun     12/24/15     6:56 AM

## 2015-12-25 LAB — BASIC METABOLIC PANEL, BLOOD
Anion Gap: 11 mmol/L (ref 7–15)
BUN: 20 mg/dL (ref 8–23)
Bicarbonate: 24 mmol/L (ref 22–29)
Calcium: 9.3 mg/dL (ref 8.5–10.6)
Chloride: 105 mmol/L (ref 98–107)
Creatinine: 1.05 mg/dL — ABNORMAL HIGH (ref 0.51–0.95)
GFR: 52 mL/min
Glucose: 107 mg/dL — ABNORMAL HIGH (ref 70–99)
Potassium: 4.6 mmol/L (ref 3.5–5.1)
Sodium: 140 mmol/L (ref 136–145)

## 2015-12-25 LAB — HEMOGRAM, BLOOD
Hct: 30.8 % — ABNORMAL LOW (ref 34.0–45.0)
Hgb: 9.8 gm/dL — ABNORMAL LOW (ref 11.2–15.7)
MCH: 28.2 pg (ref 26.0–32.0)
MCHC: 31.8 % — ABNORMAL LOW (ref 32.0–36.0)
MCV: 88.5 um3 (ref 79.0–95.0)
MPV: 10.3 fL (ref 9.4–12.4)
Plt Count: 149 10*3/uL (ref 140–370)
RBC: 3.48 10*6/uL — ABNORMAL LOW (ref 3.90–5.20)
RDW: 15.9 % — ABNORMAL HIGH (ref 12.0–14.0)
WBC: 10.2 10*3/uL — ABNORMAL HIGH (ref 4.0–10.0)

## 2015-12-25 MED ORDER — CELECOXIB 100 MG OR CAPS
100.0000 mg | ORAL_CAPSULE | Freq: Two times a day (BID) | ORAL | 0 refills | Status: DC
Start: 2015-12-25 — End: 2019-01-22

## 2015-12-25 MED ORDER — ENOXAPARIN SODIUM 30 MG/0.3ML SC SOLN
30.0000 mg | Freq: Two times a day (BID) | SUBCUTANEOUS | Status: DC
Start: 2015-12-25 — End: 2015-12-25

## 2015-12-25 MED ORDER — DOCUSATE SODIUM 100 MG OR CAPS
100.0000 mg | ORAL_CAPSULE | Freq: Two times a day (BID) | ORAL | 0 refills | Status: DC
Start: 2015-12-25 — End: 2019-01-22

## 2015-12-25 MED ORDER — ACETAMINOPHEN 325 MG PO TABS
975.00 mg | ORAL_TABLET | Freq: Four times a day (QID) | ORAL | 3 refills | Status: AC | PRN
Start: 2015-12-25 — End: ?

## 2015-12-25 MED ORDER — OXYCODONE HCL ER 10 MG PO T12A
10.0000 mg | EXTENDED_RELEASE_TABLET | Freq: Two times a day (BID) | ORAL | 0 refills | Status: DC
Start: 2015-12-25 — End: 2019-01-22

## 2015-12-25 MED ORDER — ASPIRIN 325 MG OR TABS
325.0000 mg | ORAL_TABLET | Freq: Two times a day (BID) | ORAL | Status: DC
Start: 2015-12-26 — End: 2015-12-26
  Administered 2015-12-26: 325 mg via ORAL
  Filled 2015-12-25: qty 1

## 2015-12-25 MED ORDER — ASPIRIN 325 MG OR TABS
325.00 mg | ORAL_TABLET | Freq: Two times a day (BID) | ORAL | 0 refills | Status: AC
Start: 2015-12-26 — End: 2016-01-23

## 2015-12-25 MED ORDER — SENNA 8.6 MG OR TABS
8.6000 mg | ORAL_TABLET | Freq: Every day | ORAL | 0 refills | Status: DC
Start: 2015-12-25 — End: 2019-01-22

## 2015-12-25 MED ORDER — OXYCODONE HCL 5 MG OR TABS
5.0000 mg | ORAL_TABLET | ORAL | 0 refills | Status: DC | PRN
Start: 2015-12-25 — End: 2019-01-22

## 2015-12-25 NOTE — Interdisciplinary (Signed)
12/25/15 1300   Patient Information   Why is Patient in the Hospital? left leg pain    Prior to Level of Function Ambulatory/Independent with ADL's   Assistive Device [place] Other   Referral To   Financial Resources Medicare   Discharge Planning   Living Arrangements Spouse / significant other   Support Systems Spouse / significant other   Type of Residence Private residence   Patient expects to be discharged to: home with home health    Do you have transportation home?  Yes   Social Worker Consult   Do you need to see a Education officer, museum?  No

## 2015-12-25 NOTE — Interdisciplinary (Signed)
12/25/15 1300   Expected Discharge   Expected Discharge Date 12/26/15   Expected Discharge Time 1300   Transportation Plans   Person Providing Transportation family

## 2015-12-25 NOTE — Interdisciplinary (Signed)
OCCUPATIONAL THERAPY EVALUATION AND DISCHARGE NOTE    Admission Information    Reasons for Admission: 70 year old F s/p L THA 12/24/15, LLE=WBAT with modified posterior hip precautions.   Past Medical History: Previous surgeries;HTN  Reasons for Therapy: Difficulty with ADL's  ADL: I with ADLs/functional mobility  Type of Home: House  Home Layout: Two level;Bed/Bath upstairs;Able to Live on Main level with bedroom/bathroom  Living Arrangements: Spouse / significant other  Bathroom: Walkin shower;Tub/shower;Shower/tub seat;Accessible with w/c or FWW  Home Equipment: FWW;4-wheeled walker;SPC;Bedside commode  Occupations: Retired    Objective Assessment    Parameters:    Type of Eval: Evaluation- low complexity [id number])  RUE: No Precautions  LUE: No Precautions  RLE: No Precautions  LLE: WBAT  Additional  Precautions/Contrandications:: Fall Risk;Posterior Hip Precautions (modified )  Equipment Present: IV;Tele  Medications Affecting Treatment: analgesics  Pain Score (0-10): 2  Pain Location: L hip     Exam and Neuro Assessment:    Oral Exam: no deficits  Visual: no deficits  Auditory: no deficits  Level of Consciousness: alert  Orientation Level: oriented x4  Safety: good  Follows Commands: multistep  Neglect: none  Coordination: No deficits  Sensation: No deficits  Spasticity: None    ADL and Mobility Assessment:    Feeding: Independent  Grooming/Hygiene: supervision  Upper Body Dressing: not able to participate  Lower Body Dressing: supervision (using sock aid to donn socks )  Toileting: independent  Bathing: not able to participate  Bed Mobility: not able to participate (sitting up in a chair upon entry )  Toilet Transfer: supervision  Tub/Shower Transfer: not able to participate  Sit - Stand : supervision  Sitting Balance: Normal   Standing Balance: Good  Equipment Needed : FWW   Endurance: good     Upper Extremity Assessment        Upper Extremity Comments: BUE ROM and strength WFL   Lower Extremity Comments: PT on  consult      Treatment Today    Type of Eval: Evaluation- low complexity [id number])                   Treatment Plan  The goals listed below are achievable and realistic within the designated time frame and the treatments listed below, and referred to in the treatment plan, are necessary to achieve these goals. The functional goals were created based on the patient's prior level of function.    Diagnosis: Difficulty in walking (dec activity tolerance ) 719.7    Assessment  Assessment: 70 year old F s/p L THA 12/24/15, LLE=WBAT with modified posterior hip precautions. Pt seen for OT eval and overall presents to OT as SPV level for all OOB ADLs and functional mobility using FWW. Educated pt on the role of OT, OT POC, home/ADL safety, use of sock aid for LB dressing, LB dressing techniques, and hip precautions, verbalized understanding. Pt will have constant assist/SPV from her husband upon d/c. Pt denies any concerns in regards to ADLs upon d/c. Pt is safe to d/c home with SPV when medically stable. Thank you for this consult     Problems  Problems: ADL impairment    Functional Limitations  Functional Limitation   Current Impairment Category: [id number]: Self Care  Current Impairment %: Cl: 1-19% impaired, limited or restricted  Goal Impairment: [id number]: Self Care  Goal Impairment  %: CI: 1-19% impaired, limited or restricted  Discharge Impairment: [id number]: Self Care  Discharge Impairment %: CI: 1-19% impaired, limited  or restricted  Rationale: Clinical judgement;Acceptable functional assessment    Goals       Recommendations  Equipment Recommendations: has own equipment    Patient to continue therapy:   Frequency: One time only;Patient appropropriate for D/C from therapy       Treatment Time   Contact Time: 1030  Total Treatment Time (min): 30  Total Timed Treatment (min) : 0

## 2015-12-25 NOTE — Discharge Instructions (Addendum)
Discharge instructions:    General Instructions     Wear your white stockings during the day - these help control the typical swelling in your legs after surgery and minimize the likelihood of blood clots.     Elevate your leg when you are resting to help minimize the swelling.     Use ice to help control the swelling and pain.  DO NOT USE HEAT - this will increase the swelling.     Call the office if you develop fevers (over 100.5) or chills.     You should have an office appointment about 4 weeks following your discharge from the hospital.  If you do not please call [phone].   Generally, you should return to see your surgeon at the following intervals, but this may be individualized depending on special circumstances.    Follow-up appointments (from date of surgery):   - Approximately 4 weeks after surgery   - Approximately 4 to 6 months after surgery   - One year after surgery   - 2 years after surgery   - Then once every 2 to 4 years.       Wound Care     You may shower 3 days after surgery if the wound is dry. Keep water exposure to the incision site brief and blot it dry when you get out.  Do not bathe or swim or Jacuzzi (ie. Do not submerge the incision) for approximately 3 to 4 weeks.     Do not use ointments or creams on the incision.       Keep the incision clean and dry.  Any drainage from the incision should be reported to the doctor immediately.       You may notice some bruising around the incision and into the thigh or leg.  This is not uncommon and should begin to go away within the first 2 weeks after surgery.     At the time of surgery tape-like strips may be placed on your incision to protect it.  These will eventually come off on their own in one to two weeks, or you may remove them yourself after two weeks.    Activity     Unless otherwise instructed by your doctor you can put all of your weight on your operated leg.       Maintain your posterior hip precautions as instructed by  the physical therapist - no combined hip flexion, adduction (cross-body), and internal rotation.     Estimated return to work varies depending on the demands of your job.  Some ambitious patients return to desk jobs / administrative type work as early as 1 week after surgery (but usually more like 1 month).  For active labor or heavy labor, it may take 3 to 6 months to return to work.      You should do the exercises given to you at discharge until you return for your four-week visit. At that time, you may be given a new set of exercises. You should continue to exercise until your muscles are pain-free and you can walk without a limp. It is a good idea to continue your exercises as a lifetime commitment to keep your muscles strong.     The question of when to drive is impossible to generalize to everyone because it is largely dependent on the individual.  Importantly, doctors do not have a license with the DMV to clear you or release you to return to driving.  There  are 3 primary criteria that must be met.  You need to be off of narcotic pain medicines (otherwise you are driving under the influence).  You need to be able to get in and out of the drivers seat comfortably.  And you must have regained your normal reflexes / strength.  Also, return to driving depends partly on what side had surgery (ie. Right leg operates the pedals; people with Left side surgery can generally get back to driving much sooner unless you have a clutch).  The average time to return to driving is around 4 weeks for the right side and usually sooner for the left.  We recommend testing yourself with another licensed driver in an empty parking lot or quiet street first in order to check your reflexes moving your foot from pedal to pedal.      Medications     You will likely be taking both a long acting and a short acting pain narcotic medication after surgery.  It is recommended that you begin to wean off of the long acting  medication about 7-10 days after surgery.  The short acting medication can be continued as needed.  To help wean off of the pain medications or to supplement your pain control you can use Tylenol to help with pain.     You will be on a blood thinner after surgery to help prevent blood clots.  If you are taking a blood thinner such as Lovenox, Xarelto or Coumadin, DO NOT take aspirin or anti-inflammatories such as Advil, Aleve, Motrin or ibuprofen.       You will need to take antibiotics prior to any dental appointment for at least 2 years following surgery.  This prescription is typically provided for you at your 4 month post-operative visit.     You will not be discharged from the hospital with any antibiotics unless there are specific concerns regarding infection.      Bowel Management after Joint Replacement Surgery     In patients taking opiate pain medications after surgery, constipation is very common.  The following medication regimen will help to prevent this problem:    1) Senna S daily ((docusate sodium 50 mg and Senna 8.32m) 1-2 tablets by mouth each day)    2) MiraLax ((polyethylene glycol 3350) 17g diluted in water or juice) twice a day while patient is taking opiates.    If no BM x 3 days, give mag citrate  of a bottle (approximately 150 mL or approximately 5 oz). If no results after the first  of mag citrate, then give the other  bottle the next day.  Please note, if the patient has renal disease, mag citrate should be avoided.  If the patient still has no results, please contact physician.

## 2015-12-25 NOTE — Interdisciplinary (Addendum)
Physical Therapy Treatment Note        Referring Physician: [name]         Start of Care: 12/24/15       Physical Therapy Recomendations  Physical Therapy Recommendations  Discharge Physical Therapy and equipment needs: Patient will benefit from continued skilled Physical Therapy  Patient is appropriate for discharge to: previous living situation  Patient has the following deficits that may restrict safe completion of mobility activities of daily living skills: physical limitations;functional limitations that may restrict safe navigation of physical obstacles present in living environment  Patient functional limitations necessitate consideration of the following assistive device to complete mobility activities of daily living safely: Other (Comments) (Pt has FWW at home. )     Patient Discharge Instructions  PHYSICAL THERAPY PATIENT DISCHARGE INSTRUCTIONS  Your Physical Therapist suggests the following: Supervision with walking is suggested for increased safety;Continue to use your assistive device as instructed when walking to improve your stability and prevent falls;Continue to complete your home exercise program daily as instructed  The following assistive device has been recommended for your safety: Front Wheeled Walker (Pt has FWW at home)    Assessment              Assessment : Pt demonstrates good progress toward goals. She demonstrates 434ft gait with fWW and SBA. She demonstrates ability to navigate 7 stairs with 2xhandrail assist and CGA. Pt may benefit from 1-2 more treatments for stair training with 1xhandrail assist. Anticipate pt may discharge home with HHPT (pt has FWW) when medically appropriate to do so.   Rehab Potential: Good    Plan  Patient to continue therapy for   Visit Diagnoses       Codes    Status post total replacement of left hip    -  Primary ICD-10-CM: [id number]  ICD-9-CM: V43.64    Relevant Medications    aspirin 325 MG tablet (Start on 12/26/2015)    acetaminophen (TYLENOL) 325 MG tablet    oxyCODONE (OXYCONTIN) 10 MG Controlled-Release tablet    oxyCODONE (ROXICODONE) 5 MG immediate release tablet    celecoxib (CELEBREX) 100 MG capsule    docusate sodium (COLACE) 100 MG capsule    senna (SENOKOT) 8.6 MG tablet    Other Relevant Orders    HOME HEALTH SERVICES (Completed)    Left hip pain     ICD-10-CM: M25.552  ICD-9-CM: 719.45    Relevant Orders    PATHOLOGY TISSUE EXAM    Arthritis of left hip     ICD-10-CM: M19.90  ICD-9-CM: 716.95    Relevant Orders    PATHOLOGY TISSUE EXAM    Difficulty walking     ICD-10-CM: R26.2  ICD-9-CM: 719.7    Decreased functional activity tolerance     ICD-10-CM: R68.89  ICD-9-CM: 780.99        Treatment Plan  Include in My Healthcare: Myself  Treatment Plan Discussion & Agreement: Patient;Family (Patient's husband present in room)  Patient/Family Questions: Yes - All questions asked & answered  Patient/Family Teaching: Ongoing  Preferred Learning Method: Verbal instructions;Demonstration;Printed material  Focus for Next Treatment: Functional mobility training;Gait training;Patient/Family training;Patient exercise program instruction;Safety instruction;Therapeutic activities;Therapeutic exercise  Inpatient Progress report  Patient participation: Excellent, patient participated in all treatment sessions  Patient compliance with therapy program: Excellent  Response to Previous Treatment: Patient making good progress towards set functional goals  Inpatient Treatment Plan  Inpatient Treatment Frequency: 1-2 times daily  Inpatient Treatment Duration: 1-3 visits  Subjective      Patient stated Goal  Patient stated goal: To be able to walk with her husband without pain.     Pain Assessment  INPATIENT PAIN SCALES    Numeric Pain Rating Scale (must address all rows)  Pain Intensity - rating at present: 3  Pain Intensity - rating at worst : 3  Pain Intensity- rating after treatment: 3  Frequency : Constant  Location : Over incision;Left  lower extremity Buttock    Precautions  Precautions/Contraindications  Inpatient Precautions / Contraindications: Posterior hip precautions;Weight bearing restrictions;Fall risk  Left Lower Extremity : Weight bearing as tolerated    Objective    Therapeutic Procedures  Gait Training [phone]): Assistive device training;Patient education;Stair/curb/obstacle navigation training     Total TIMED Treatment (min) : 30    Gait Training : PT educated pt on how to decrease UE reliance on FWW during gait and to perform heel contact at initial contact of gait to increase stride length. Pt demonstrates understanding. Gait x 422ft with FWW and SBA total. Navigation of stairs x 7 stairs with 2x handrail assist. PT instructed pt in optimal leading LE. Further navigation of stairs limited by IV line.               Treatment Time   Treatment start time: 0945  Total TIMED Treatment  (min): 30  Total Treatment Time (min): 30

## 2015-12-25 NOTE — Interdisciplinary (Signed)
Physical Therapy Discharge Note     Referring Physician: [name]  Start of Care: 12/24/15  Onset date : 12/24/15  Reason for referral: Activity tolerance limitation;Decline in functional ability;Decline in functional mobility       Physical Therapy Recomendations  Physical Therapy Recommendations  Discharge Physical Therapy and equipment needs: Patient will benefit from continued skilled Physical Therapy (HHPT)  Patient is appropriate for discharge to: previous living situation  Patient has the following deficits that may restrict safe completion of mobility activities of daily living skills: physical limitations;functional limitations that may restrict safe navigation of physical obstacles present in living environment  Patient functional limitations necessitate consideration of the following assistive device to complete mobility activities of daily living safely: Other (Comments) (Pt has FWW at home. )    Patient Discharge Instructions  PHYSICAL THERAPY PATIENT DISCHARGE INSTRUCTIONS  Your Physical Therapist suggests the following: Supervision with walking is suggested for increased safety;Continue to use your assistive device as instructed when walking to improve your stability and prevent falls;Continue to complete your home exercise program daily as instructed  The following assistive device has been recommended for your safety: Front Wheeled Walker (Pt has FWW at home)    Inpatient Therapy Overview  Response to therapy: Excellent  Patient participation: Excellent, patient participated in all treatment sessions  Patient compliance with therapy program: Excellent    Assessment              Assessment : Pt demonstrates good progress toward goals. She ambulates >249f with FWW and SBA and demonstrates ability to navigate 1 flight of stairs with 1xHRA and handhold assist. No further IP PT needs. Pt may benefit from HHPT upon discharge to progress home exercises and optimize rehabilitation for functional  mobility.   Rehab Potential: Good    Therapy Goals                                                                                        Current Level Goals for Episode of Care    Functional Deficit Difficulty walking   Long term Functional Goal  Pt will be able to amb > 200' with LRAD at supervisory level   MET   Impairment #1 Gait Short Term Impairment Goal #1 Impairment : Gait  Gait goals: To promote household mobility, patient able to walk with a reciprocal gait pattern with the least restrictive assistive device for at least 100 feet with  Assitance : modified independence  No. visits: 3-5  Goal status: New   MET   Impairment #2 Functional Mobility Short Term Impairment Goal #2 Impairment : Functional Mobility  Custom goal: Pt will be SPV for all functional transfers with LRAD  No. visits: 3-5  Goal status: New   MET   Functional Limitation Reporting    Visit type: Discharge  Impairment Category: Mobility  Mobility: Walking and Moving Around Current Status ([phone]: CK 40%-59% impaired, limited or restricted  Mobility: Walking and Moving Around Goal Status ([phone]: CI 1%-19% impaired, limited or restricted     Objective    Therapeutic Procedures  Gait Training [phone]: Stair/curb/obstacle navigation training     Total  TIMED Treatment (min) : 15    Gait Training : PT reinforced education about correct lead LE. Pt demonstrates 250f gait with FWW and SBA and navigation of stairs with 1xhandrail and CGA up and 1xhandrail and handhold assist upon descent.               Treatment Time   Treatment start time: 1530  Total TIMED Treatment  (min): 15  Total Treatment Time (min): 15

## 2015-12-25 NOTE — Plan of Care (Signed)
Problem: Falls, Risk of  Goal: Keep patient free from falls utilizing universal fall precautions  Outcome: Goal Met  Goal: Ensure safe mobility of patient (Mobility)  Outcome: Progressing toward goal, anticipate improvement over: next 12-24 hours  Goal: Minimize the risk of falling related to volume and electrolyte imbalance (Volume/Electrolyte status)  Outcome: Progressing toward goal, anticipate improvement over: next 12-24 hours    Problem: Pain - Acute  Goal: Communication of presence of pain  Outcome: Goal Met  Goal: Control of acute pain  Outcome: Progressing toward goal, anticipate improvement over: >48 hours  Goal: Knowledge of pain management methods  Outcome: Goal Met    Problem: Infection, Surgical Site  Goal: Absence of infection signs and symptoms  Outcome: Goal Met  Goal: Perform proper infection control measures  Outcome: Progressing toward goal, anticipate improvement over: next 12-24 hours    Problem: Mobility - Impaired  Goal: Able to ambulate within specified parameters  Reference [facility] Progressive Mobility protocol.  Outcome: Progressing toward goal, anticipate improvement over: next 12-24 hours  Goal: Able to participate in prescribed physical therapy  Outcome: Progressing toward goal, anticipate improvement over: next 12-24 hours  Goal: Able to use ambulatory assistive device appropriately  Outcome: Progressing toward goal, anticipate improvement over: next 12-24 hours

## 2015-12-25 NOTE — Progress Notes (Signed)
Orthopaedic Joint Surgery Progress Note    Events:  NAEO  Walked 200' FWW with PT    Subjective:  Doing well. Pain well controlled. No complaints.  Requesting Saturday DC    Objective:  BP 99/62  Pulse 65  Temp 98.2 F (36.8 C)  Resp 17  Ht 5\' 7"  (1.702 m)  Wt 116.8 kg (257 lb 9.6 oz)  SpO2 100%  BMI 40.35 kg/m2    Intake/Output Summary (Last 24 hours) at 12/24/15 1218  Last data filed at 12/24/15 1036   Gross per 24 hour   Intake             1000 ml   Output              670 ml   Net              330 ml       NAD  NLB    LLE  Dressing c/d/i  Hip soft, mild distal hematoma  SILT s/s/sp/dp/t  5/5 TA/GSC/EHL/FHL  WWP, BCR, PT/DP 2+    Labs:  AM labs pending    Imaging:  Postop XR show implant in appropriate alignment without evidence of complication    Assessment:  70 year old F s/p L THA 12/24/15 doing well.    Plan:  - Analgesia: transition to oral, pathway  - Activity: WBAT LLE, PHP  - Anticoagulation: ASA325 BID x 4 weeks  - Antibiotics: 2g ancef x 2  - F/U AM CBC  - F/U: 4 weeks postop check  - Disposition: planning; anticipate DC home later today vs tomorrow with HHPT  - Orthopaedic joint team will follow. Please page 1st call with questions.

## 2015-12-25 NOTE — Plan of Care (Signed)
Problem: Day of Surgery  Goal: Knowledge and demonstration of post operative practices/ behaviors by the patient  Outcome: Goal Met  Patient is able to participate in care. She's aware of hip precautions and able to get OOB and ambulate with minimal assistance. Patient eager to ambulate and work with PT, also participates use of pedi cycle.   Goal: Daily interdisciplinary clinical practice guideline of physical, physiological, and emotional activities toward discharge  Outcome: Goal Met  Patient tolerating regular diet and adequate PO intake. Denies any nausea or vomiting. S.L patient from IVF due to good PO intake as ordered. Foley catheter removed and patient voiding. Patient admits to passing flatus but no BM yet. Colace and metamucil started this morning. Plan ongoing.   Goal: Pain management is by multi-modal methods  Outcome: Goal Met  Patient states her pain is manageable and declines pain medication. Tylenol ATC as ordered. Ice pack to incision site. Continue to assess for the presence of pain.     Problem: Post Operative Day 1  Goal: Knowledge and demonstration of post operative practices/ behaviors by the patient  Outcome: Goal Met  Patient is able to participate in care. She's aware of hip precautions and able to get OOB and ambulate with minimal assistance. Patient eager to ambulate and work with PT, also participates use of pedi cycle.   Goal: Daily interdisciplinary clinical practice guideline of physical, physiological, and emotional activities toward discharge  Outcome: Goal Met  Patient tolerating regular diet and adequate PO intake. Denies any nausea or vomiting. S.L patient from IVF due to good PO intake as ordered. Foley catheter removed and patient voiding. Patient admits to passing flatus but no BM yet. Colace and metamucil started this morning. Plan ongoing.   Goal: Pain management is by multi-modal methods  Outcome: Goal Met  Patient states her pain is manageable and declines pain medication.  Tylenol ATC as ordered. Ice pack to incision site. Continue to assess for the presence of pain.   Goal: Knowledge of timely and safe discharge planning  Outcome: Goal Met  Plan for discharge home tomorrow with [facility]. Husband already picked up prescriptions from discharge patient.

## 2015-12-26 LAB — HEMOGRAM, BLOOD
Hct: 33.8 % — ABNORMAL LOW (ref 34.0–45.0)
Hgb: 10.7 gm/dL — ABNORMAL LOW (ref 11.2–15.7)
MCH: 27.7 pg (ref 26.0–32.0)
MCHC: 31.7 % — ABNORMAL LOW (ref 32.0–36.0)
MCV: 87.6 um3 (ref 79.0–95.0)
MPV: 10.4 fL (ref 9.4–12.4)
Plt Count: 189 10*3/uL (ref 140–370)
RBC: 3.86 10*6/uL — ABNORMAL LOW (ref 3.90–5.20)
RDW: 16.2 % — ABNORMAL HIGH (ref 12.0–14.0)
WBC: 9.7 10*3/uL (ref 4.0–10.0)

## 2015-12-26 NOTE — Progress Notes (Signed)
Orthopaedic Joint Surgery Progress Note    Events:  NAEO/n.  WOrked on stairs with PT    Subjective:  Doing well. Pain well controlled. No complaints.  Ready for DC today    Objective:  BP 142/82  Pulse 75  Temp 97.7 F (36.5 C)  Resp 18  Ht 5\' 7"  (1.702 m)  Wt 116.8 kg (257 lb 9.6 oz)  SpO2 98%  BMI 40.35 kg/m2    Intake/Output Summary (Last 24 hours) at 12/24/15 1218  Last data filed at 12/24/15 1036   Gross per 24 hour   Intake             1000 ml   Output              670 ml   Net              330 ml       NAD  NLB    LLE  Dressing c/d/i  Hip soft, mild distal hematoma  SILT s/s/sp/dp/t  5/5 TA/GSC/EHL/FHL  WWP, BCR, PT/DP 2+      Assessment:  70 year old F s/p L THA 12/24/15 doing well.    Plan:  - Analgesia: transition to oral, pathway  - Activity: WBAT LLE, PHP  - Anticoagulation: ASA325 BID x 4 weeks  - Antibiotics: 2g ancef x 2  - F/U: 4 weeks postop check  - Disposition: planning; anticipate DC home today  - Orthopaedic joint team will follow. Please page 1st call with questions.

## 2015-12-26 NOTE — Plan of Care (Signed)
Problem: Falls, Risk of  Goal: Keep patient free from falls utilizing universal fall precautions  Outcome: Goal Met  Goal: Ensure safe mobility of patient (Mobility)  Outcome: Goal Met  Goal: Minimize the risk of falling related to volume and electrolyte imbalance (Volume/Electrolyte status)  Outcome: Goal Met    Problem: Day of Surgery  Goal: Knowledge and demonstration of post operative practices/ behaviors by the patient  Outcome: Goal Met    Problem: Post Operative Day 1  Goal: Knowledge and demonstration of post operative practices/ behaviors by the patient  Outcome: Goal Met    Problem: Pain - Acute  Goal: Communication of presence of pain  Outcome: Goal Met  Goal: Control of acute pain  Outcome: Goal Met  Goal: Knowledge of pain management methods  Outcome: Goal Met    Problem: Infection, Surgical Site  Goal: Absence of infection signs and symptoms  Outcome: Goal Met  Goal: Perform proper infection control measures  Outcome: Goal Met    Problem: Mobility - Impaired  Goal: Able to ambulate within specified parameters  Reference  [facility] Progressive Mobility protocol.   Outcome: Goal Met  Goal: Able to participate in prescribed physical therapy  Outcome: Goal Met    Problem: Discharge Planning  Goal: Participation in care planning  Outcome: Goal Met  Patient has been discharged.   Transported off unit per staff via wheelchair.    Discharged to home in stable condition.    Removed PIV then pressure dressing to site.   No vaccines indicated at this time.    Given discharge paper work.    Reviewed with patient discharge instructions for medications, diet, activity, follow up appointments, wound care, and s/s to call 911.   Patient verbalized understanding.       Problem: Post Operative Day 2  Goal: Knowledge and demonstration of post operative practices/ behaviors by the patient  Outcome: Goal Met

## 2015-12-28 NOTE — Interdisciplinary (Addendum)
12/25/15 1300   Care Coordination - Expected Discharge Forecast   Expected Discharge Date 12/26/15   Discharge Planning Needs   Does this patient have CM discharge planning needs? Yes   CM Needs Met? Yes   Care Coordination Documentation   Home Health Care Agency: [facility]  [phone]    Start of care for tomorrow 12/28/15 with vendor noted above.

## 2015-12-29 NOTE — Anesthesia Postprocedure Evaluation (Signed)
Anesthesia Transfer of Care Note    Patient: Angela Calhoun    Procedures performed: Procedure(s):  ARTHROPLASTY, LEFT TOTAL HIP    Vital signs: stable           Anesthesia Post Note    Patient: Angela Calhoun    Procedure(s) Performed: Procedure(s):  ARTHROPLASTY, LEFT TOTAL HIP      Final anesthesia type: general, spinal    Patient location: PACU    Post anesthesia pain: adequate analgesia    Post assessment: no apparent anesthetic complications, tolerated procedure well and no evidence of recall    Mental status: awake, alert  and oriented    Airway Patent: Yes    Last Vitals:   Vitals:    12/26/15 0714   BP: 142/82   Pulse: 75   Resp: 18   Temp: 36.5 C   SpO2: 98%       Post vital signs: stable    Hydration: adequate    N/V:no    Anesthetic complications: no    Disposal of controlled substances: All controlled substances during the case accounted for and disposed of per hospital policy         Plan of care per primary team.

## 2016-01-01 ENCOUNTER — Telehealth (INDEPENDENT_AMBULATORY_CARE_PROVIDER_SITE_OTHER): Payer: Self-pay | Admitting: Orthopaedic Surgery

## 2016-01-01 NOTE — Telephone Encounter (Signed)
Lulu Riding (pt's home nurse) needs an ok to continue with mepilex bordered and plain dressing. Pt has mild warmth/swelling at incision site, has stopped icing. Has questions about pain meds & stool softener. Would like a call back at [phone] or cell # [phone]. Thanks.

## 2016-01-01 NOTE — Telephone Encounter (Signed)
RN received return call from patients Angela Calhoun. She reports that the patient has edema and warmth to the hip but no erythema or drainage. Denies temp. Advised that they should be elevating the extremity above the level of the heart and icing for 15-20 minutes up to once an hour, wash cloth wrapped around the ice. Discussed s/sx of infection, and instructions to report those symptoms right away.    Discussed MD orders for oxycodone, colace, MS Contin, and aspirin. She v/u of all instructions.

## 2016-01-01 NOTE — Telephone Encounter (Signed)
DOS: 12/24/15  ARTHROPLASTY, LEFT TOTAL HIP    RN attempted to reach patient, no answer. Voicemail box is full.

## 2016-01-04 NOTE — Procedures (Signed)
FINAL PATHOLOGIC DIAGNOSIS:  A: Femoral head, left, arthroplasty       -Degenerative joint disease.       -Bone marrow with trilineage hematopoiesis.    SPECIMEN(S) SUBMITTED:  A: Left hip bone    CLINICAL HISTORY:  Left hip arthritis.    GROSS DESCRIPTION:  A: The specimen (received fresh and subsequently placed in formalin,  labeled with the patient's name, medical record number and "left hip bone")  consists of a 5.5 x 5.0 x 4.9 cm flattened femoral head.  The articular  cartilage is pitted tan and granular. A 2.1 cm area of eburnation is  present on the articular surface.   Mild shaggy red soft tissue and  moderate osteophyte formation is present at the neck of the specimen.  The  margin of excision is smooth and the exposed trabecular bone is spongy and  yellow.  Upon sectioning, a subchondral cyst is identified measuring 0.7 x  0.7 cm.  A representative section is submitted in cassette A1 following  decalcification.  JH/kc  CONFIDENTIAL HEALTH INFORMATION: Health Care information is personal and  sensitive information. If it is being faxed to you it is done so under  appropriate authorization from the patient or under circumstances that do  not require patient authorization. You, the recipient, are obligated to  maintain it in a safe, secure and confidential manner. Re-disclosure  without additional patient consent or as permitted by law is prohibited.  Unauthorized re-disclosure or failure to maintain confidentiality could  subject you to penalties described in federal and state law.  If you have  received this report or facsimile in error, please notify the [facility] immediately and destroy the received document(s).    Material reviewed and Interpreted and  Report Electronically Signed by:  Riley Lam Lenice Llamas M.D., Ph.D. [id number])  01/04/16 14:10  Electronic Signature derived from a single  controlled access password

## 2016-01-08 ENCOUNTER — Encounter (INDEPENDENT_AMBULATORY_CARE_PROVIDER_SITE_OTHER): Payer: Self-pay | Admitting: Orthopaedic Surgery

## 2016-01-08 DIAGNOSIS — Z96642 Presence of left artificial hip joint: Principal | ICD-10-CM

## 2016-01-20 ENCOUNTER — Telehealth (INDEPENDENT_AMBULATORY_CARE_PROVIDER_SITE_OTHER): Payer: Self-pay | Admitting: Orthopaedic Surgery

## 2016-01-20 NOTE — Telephone Encounter (Signed)
Patient's nurse Lulu Riding called to speak to [facility]. She states the pt is no longer dressing her wound (wants it open/uncovered) and just wanted to let you know she's doing well. She has an appt 01/26/16. Please call @ [phone] her if you have any questions. Thank you.

## 2016-01-21 NOTE — Telephone Encounter (Signed)
Noted  

## 2016-01-26 ENCOUNTER — Encounter (INDEPENDENT_AMBULATORY_CARE_PROVIDER_SITE_OTHER): Payer: Self-pay | Admitting: Orthopaedic Surgery

## 2016-01-26 ENCOUNTER — Inpatient Hospital Stay (INDEPENDENT_AMBULATORY_CARE_PROVIDER_SITE_OTHER): Admit: 2016-01-26 | Discharge: 2016-01-26 | Disposition: A | Discharge status: Home / Self Care | Payer: Medicare Other

## 2016-01-26 ENCOUNTER — Ambulatory Visit (INDEPENDENT_AMBULATORY_CARE_PROVIDER_SITE_OTHER): Payer: Medicare Other | Admitting: Orthopaedic Surgery

## 2016-01-26 VITALS — BP 120/73 | HR 77 | Temp 98.1°F | Ht 67.0 in | Wt 257.6 lb

## 2016-01-26 DIAGNOSIS — Z09 Encounter for follow-up examination after completed treatment for conditions other than malignant neoplasm: Secondary | ICD-10-CM

## 2016-01-26 DIAGNOSIS — Z471 Aftercare following joint replacement surgery: Secondary | ICD-10-CM

## 2016-01-26 DIAGNOSIS — Z96642 Presence of left artificial hip joint: Principal | ICD-10-CM

## 2016-01-26 DIAGNOSIS — Z96643 Presence of artificial hip joint, bilateral: Secondary | ICD-10-CM

## 2016-01-26 NOTE — Progress Notes (Signed)
History of Present Illness:     Angela Calhoun is a 70 year old female who is here for recheck now 4 weeks following LEFT total hip arthroplasty.  Patient is doing well.  The patient is progressing well with physical therapy.    No reports of fevers, chills or night sweats.  No problems with the incision.  Sutures need to come out today.    Past Medical History   Diagnosis Date   . Arthritis of knee      Left > Right   . Dyslipidemia    . GERD (gastroesophageal reflux disease)    . HTN (hypertension)    . Hypothyroidism    . Left Kidney carcinoma 08/21/2013 sx     s/p Left nephrectomy 08/21/2013: no CHEMO/XRT   . Osteoarthritis of hip      Left and s/p R THR     Past Surgical History   Procedure Laterality Date   . Cholecystectomy, lap  2008   . 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     . Pb remove tonsils/adenoids,<13 y/o     . Pb biopsy of breast, incisional  1990's   . Knee i&d  1980's     Traumatic Injury to Knee    . Pb laparoscopy radical nephrectomy Left 08/21/2013     Cancer of Left kidney   . Colonoscopy     . S/p right thr  2011     GA per pt report with REG   . Sp arthroplasty left  total knee  2015     Allergies   Allergen Reactions   . Dermabond [Other] Rash   . Adhesive Tape Rash     Current Outpatient Prescriptions   Medication Sig   . acetaminophen (TYLENOL) 325 MG tablet Take 3 tablets (975 mg) by mouth every 6 hours as needed for Mild Pain (Pain Score 1-3).   Marland Kitchen aspirin 325 MG tablet Take 1 tablet (325 mg) by mouth 2 times daily.   . celecoxib (CELEBREX) 100 MG capsule Take 1 capsule (100 mg) by mouth every 12 hours.   . Cholecalciferol (VITAMIN D3) 3000 UNITS tablet Take 1 tablet by mouth daily. Taking  1000 iu   . Cranberry 300 MG TABS Bid   . docusate sodium (COLACE) 100 MG capsule Take 1 capsule (100 mg) by mouth 2 times daily.   Marland Kitchen levothyroxine (LEVOXYL) 137 MCG tablet Take 137 mcg by mouth every morning.   Marland Kitchen lisinopril-hydrochlorothiazide (PRINZIDE, ZESTORETIC)  10-12.5 MG per tablet Take 1 tablet by mouth every morning.   . multivitamin (CENTRUM SILVER) tablet Take 1 tablet by mouth daily.   Marland Kitchen omeprazole (PRILOSEC) 20 MG capsule Take 20 mg by mouth every morning.   Marland Kitchen oxyCODONE (OXYCONTIN) 10 MG Controlled-Release tablet Take 1 tablet (10 mg) by mouth every 12 hours.   Marland Kitchen oxyCODONE (ROXICODONE) 5 MG immediate release tablet Take 1-2 tablets (5-10 mg) by mouth every 4 hours as needed for Moderate Pain (Pain Score 4-6) or Severe Pain (Pain Score 7-10).   . Probiotic Product (PROBIOTIC DAILY PO)    . senna (SENOKOT) 8.6 MG tablet Take 1 tablet (8.6 mg) by mouth daily.   . simvastatin (ZOCOR) 10 MG tablet Take 10 mg by mouth every evening.     No current facility-administered medications for this visit.        Physical Exam:  BP 120/73  Pulse 77  Temp 98.1 F (36.7 C) (Oral)  Ht 5\' 7"  (1.702 m)  Wt 116.8 kg (257 lb 9.6 oz)  SpO2 98%  BMI 40.35 kg/m2  General Appearance: healthy, alert, no distress, pleasant affect, cooperative.  Mental Status: Appearance/Cooperation: in no apparent distress and oriented times 3. Extremities:  no cyanosis, clubbing. no edema.  Thigh is soft.  Gait normal  Incision is well healed.  There are no signs of infection.  Alignment is anatomic.  Range of motion:  Flexion          Flexion Contracture       Abduction     Internal Rotation       External Rotation  100                      0                                 50                20                              50  Imaging:  Radiographs of the LEFT hip demonstrate cementless total hip arthroplasty in excellent position without evidence of fracture, dislocation or failure.    Assessment:  Status post LEFT total hip arthroplasty     Plan:  We have discussed post-operative instructions.  Directions related to a home exercise program were given.  Wound care instructions were discussed.  We will transition to out patient physical therapy.  Discussed pain control.  Continue to weight bear as tolerated.   Modified posterior hip precautions again discussed.    Plan on seeing the patient back in 3 months unless there are any problems or concerns sooner.       Orders Placed This Encounter   Procedures   . Physical Therapy - Outside (Non-[place])

## 2016-01-27 ENCOUNTER — Encounter (INDEPENDENT_AMBULATORY_CARE_PROVIDER_SITE_OTHER): Payer: Medicare Other | Admitting: Orthopaedic Surgery

## 2016-02-24 NOTE — Progress Notes (Signed)
ORTHO STAFF ADDENDUM:  See [name] Hamilton's note for details.  I saw and evaluated the patient.  I have discussed the patient with [name] and agree with his findings and the plan we developed as written.    Angela Calhoun is doing well now 4 weeks postop.  She is happy with the recovery so far.  PE:     01/26/16  1351   BP: 120/73   Pulse: 77   Temp: 98.1 F (36.7 C)   SpO2: 98%      gait - slight limp.  Walking unassisted.  Incision healing well.  ROM as recorded in [name]' note.    XRays look great.  No e/o loosening, fracture or implant problems.    Plan: Discussed activities / precautions / ongoing rehab / dental prophylaxis.  RTC 3 mos or sooner PRN.

## 2016-04-13 ENCOUNTER — Encounter (INDEPENDENT_AMBULATORY_CARE_PROVIDER_SITE_OTHER): Payer: Self-pay | Admitting: Orthopaedic Surgery

## 2016-04-13 DIAGNOSIS — Z96642 Presence of left artificial hip joint: Principal | ICD-10-CM

## 2016-04-26 ENCOUNTER — Ambulatory Visit (INDEPENDENT_AMBULATORY_CARE_PROVIDER_SITE_OTHER): Payer: Medicare Other | Admitting: Orthopaedic Surgery

## 2016-04-26 ENCOUNTER — Encounter (INDEPENDENT_AMBULATORY_CARE_PROVIDER_SITE_OTHER): Payer: Self-pay | Admitting: Orthopaedic Surgery

## 2016-04-26 ENCOUNTER — Inpatient Hospital Stay (INDEPENDENT_AMBULATORY_CARE_PROVIDER_SITE_OTHER): Admit: 2016-04-26 | Discharge: 2016-04-26 | Disposition: A | Discharge status: Home / Self Care | Payer: Medicare Other

## 2016-04-26 VITALS — BP 140/78 | HR 70 | Temp 97.9°F | Ht 67.0 in | Wt 257.0 lb

## 2016-04-26 DIAGNOSIS — Z96642 Presence of left artificial hip joint: Principal | ICD-10-CM

## 2016-04-26 DIAGNOSIS — M1990 Unspecified osteoarthritis, unspecified site: Secondary | ICD-10-CM

## 2016-04-26 NOTE — Progress Notes (Signed)
Angela Calhoun returns for follow-up now approximately 4 months s/p LEFT total hip replacement.  She is doing very well with the hip.  She is incidentally curious about her R knee - has not had that replaced yet - has some awareness of it but not bad currently.    PE -    04/26/16  1138   BP: 140/78   Pulse: 70   Temp: 97.9 F (36.6 C)     GEN: Alert and oriented x3, well groomed, Appropriate mood and affect.    HEENT: NC/AT, EOMI, sclera anicteric   CARDIO: RRR  PULM: no labored breathing, no audible wheezing  ABD: Non-distended    Normal gait.  Hip ROM is supple and pain free.    Xrays - New xrays were obtained and reviewed.  Implants are in excellent position.  No subsidence, no implant related failure.    A/P - S/p hip replacement.  Doing very well.  We discussed activities and precautions and dental prophylaxis.  She should return for routine follow-up at the one year for xrays and was told to call or come in sooner if there are any questions or concerns.  We can get an xray of the right knee at the time of next visit also.

## 2016-04-27 ENCOUNTER — Encounter (INDEPENDENT_AMBULATORY_CARE_PROVIDER_SITE_OTHER): Payer: Medicare Other | Admitting: Orthopaedic Surgery

## 2016-06-08 ENCOUNTER — Encounter (INDEPENDENT_AMBULATORY_CARE_PROVIDER_SITE_OTHER): Payer: Self-pay | Admitting: Orthopaedic Surgery

## 2016-06-08 DIAGNOSIS — Z96652 Presence of left artificial knee joint: Principal | ICD-10-CM

## 2016-09-29 ENCOUNTER — Telehealth (INDEPENDENT_AMBULATORY_CARE_PROVIDER_SITE_OTHER): Payer: Self-pay | Admitting: Orthopaedic Surgery

## 2016-09-29 NOTE — Telephone Encounter (Signed)
Wells Guiles with [facility] will fax the following to [phone]  -Face to face encounter: please attach chart notes when returned to [place].  -[phone]  -Start of care physician order.  All dated 12/29/15    Once received please return fax to [phone]

## 2017-01-10 ENCOUNTER — Encounter (INDEPENDENT_AMBULATORY_CARE_PROVIDER_SITE_OTHER): Payer: Self-pay | Admitting: Orthopaedic Surgery

## 2017-01-10 NOTE — Telephone Encounter (Signed)
From: [name]  To: [name], MD  Sent: 01/10/2017 8:29 AM PST  Subject: 6-General Complaint    Dr. Diona Foley, I had my left hip replaced in Jan. 2017 and all has gone well. BUT, yesterday I went to my Urgent Care facility and was diagnosed with hip bursitis on my left hip. I have swelling and have had discomfort and pain for at least 2 weeks BEFORE going to urgent care thinking it would go away on its own. It is worse; the doctor at Urgent Care has me icing 2 to 3 times per day for 15 mins. each time and taking Tylenol 500 mg or he prescribed Tylenol with codeine if the pain gets really bad. With only one kidney I cannot take anti inflammatories. The doctor suggested I see how it goes and if in another 2 weeks I am not better than go see my GP or a sports medicine doctor for a possible cortisone shot.  If possible, should I be seen by you or someone on your team as soon as possible for further evaluation. They took Xrays of my hip and said there is no problem with the hip replacement. But I am concerned and would like to fine a remedy for the pain and discomfort as I am very limited in being able to get around and very painful to get in and out of bed. Thank you, Angela Calhoun

## 2017-01-12 ENCOUNTER — Telehealth (INDEPENDENT_AMBULATORY_CARE_PROVIDER_SITE_OTHER): Payer: Self-pay | Admitting: Orthopaedic Surgery

## 2017-01-12 NOTE — Telephone Encounter (Signed)
Patient is calling back same issue.     Hi Angela Calhoun,    I going to forward your message to [facility] Dr. Leatrice Jewels PA to see what he recommends for you.    [name]    ----- Message -----   From: [name]   Sent: 2/6/20188:29 AM PST   To: [name], MD  Subject: 6-General Complaint    Dr. Diona Foley, I had my left hip replaced in Jan. 2017 and all has gone well.BUT, yesterday I went to my Urgent Care facility and was diagnosed with hip bursitis on my left hip.I have swelling and have had discomfort and pain for at least 2 weeks BEFORE going to urgent care thinking it would go away on its own.It is worse; the doctor at Urgent Care has me icing 2 to 3 times per day for 15 mins. each time and taking Tylenol 500 mg or he prescribed Tylenol with codeine if the pain gets really bad.With only one kidney I cannot take anti inflammatories.The doctor suggested I see how it goes and if in another 2 weeks I am not better than go see my GP or a sports medicine doctor for a possible cortisone shot.  If possible, should I be seen by you or someone on your team as soon as possible for further evaluation.They took Xrays of my hip and said there is no problem with the hip replacement.But I am concerned and would like to fine a remedy for the pain and discomfort as I am very limited in being able to get around and very painful to get in and out of bed.Thank you, Angela Calhoun

## 2017-01-12 NOTE — Telephone Encounter (Addendum)
LOV: 04/26/2016 - status post left hip replacement    RN will route to provider for review.    UPDATE: [id number]) RN called pt to relay provider suggestion book pt next avail return appt. 01/20/2017 @ 1430 @ [facility] and will add to wait list for sooner appt. Pt verbalized understanding encounter closed.    --------------------------------------------------------------------------------------------    Suella Broad,    I going to forward your message to [facility] Dr. Leatrice Jewels PA to see what he recommends for you.    [name]    ----- Message -----   From: [name]   Sent: 2/6/20188:29 AM PST   To: [name], MD  Subject: 6-General Complaint    Dr. Diona Foley, I had my left hip replaced in Jan. 2017 and all has gone well.BUT, yesterday I went to my Urgent Care facility and was diagnosed with hip bursitis on my left hip.I have swelling and have had discomfort and pain for at least 2 weeks BEFORE going to urgent care thinking it would go away on its own.It is worse; the doctor at Urgent Care has me icing 2 to 3 times per day for 15 mins. each time and taking Tylenol 500 mg or he prescribed Tylenol with codeine if the pain gets really bad.With only one kidney I cannot take anti inflammatories.The doctor suggested I see how it goes and if in another 2 weeks I am not better than go see my GP or a sports medicine doctor for a possible cortisone shot.  If possible, should I be seen by you or someone on your team as soon as possible for further evaluation.They took Xrays of my hip and said there is no problem with the hip replacement.But I am concerned and would like to fine a remedy for the pain and discomfort as I am very limited in being able to get around and very painful to get in and out of bed.Thank you, Brendia Sacks

## 2017-01-19 ENCOUNTER — Encounter (INDEPENDENT_AMBULATORY_CARE_PROVIDER_SITE_OTHER): Payer: Medicare Other | Admitting: Surgical

## 2017-01-20 ENCOUNTER — Encounter (INDEPENDENT_AMBULATORY_CARE_PROVIDER_SITE_OTHER): Payer: Medicare Other | Admitting: Surgical

## 2017-02-10 ENCOUNTER — Encounter (INDEPENDENT_AMBULATORY_CARE_PROVIDER_SITE_OTHER): Payer: Medicare Other | Admitting: Surgical

## 2017-03-03 ENCOUNTER — Encounter (INDEPENDENT_AMBULATORY_CARE_PROVIDER_SITE_OTHER): Payer: Medicare Other | Admitting: Surgical

## 2017-10-03 ENCOUNTER — Encounter (INDEPENDENT_AMBULATORY_CARE_PROVIDER_SITE_OTHER): Payer: Medicare Other | Admitting: Orthopaedic Surgery

## 2017-10-05 ENCOUNTER — Ambulatory Visit (HOSPITAL_BASED_OUTPATIENT_CLINIC_OR_DEPARTMENT_OTHER): Payer: Medicare Other | Admitting: Orthopaedic Surgery

## 2018-04-19 ENCOUNTER — Encounter (INDEPENDENT_AMBULATORY_CARE_PROVIDER_SITE_OTHER): Payer: Self-pay | Admitting: Orthopaedic Surgery

## 2018-04-20 NOTE — Telephone Encounter (Signed)
From: [name]  To: [name], MD  Sent: 04/19/2018 3:19 PM PDT  Subject: 20-Other    Dr. Diona Foley, my dentist recently passed away and he would send in Rx for me for Amoxicillin 500 mg to take before any dental procedures. I have found a new dentist but she prefers I get this medication through your office. Would you please send in a Rx for 20 pills for me to my local [facility] at [address] [place], [place]. That will cover me for upcoming cleanings and other procedures. Thank you, Angela Calhoun

## 2018-04-24 NOTE — Telephone Encounter (Signed)
Patient has a L THA on 12/24/15, may not need antibiotics, however there is a diagnosis in chart of history of kidney carcinoma.  Routed to RN for advise.

## 2018-04-27 ENCOUNTER — Encounter (INDEPENDENT_AMBULATORY_CARE_PROVIDER_SITE_OTHER): Payer: Self-pay | Admitting: Orthopaedic Surgery

## 2018-04-27 NOTE — Telephone Encounter (Signed)
From: [name]  To: [name], MD  Sent: 04/27/2018 4:34 PM PDT  Subject: Selinda Orion, thank you for your response. However, I thought I needed to continue doing that for up to 5 years after my last joint replacement which I thought was in 2015 ? So has the guidelines for doing this changed?  ----- Message -----  From: Gordy Levan, PA  Sent: 04/26/2018 11:13 AM PDT  To: [name]  Subject: RE: Eustace Quail,  You no longer need to take antibiotics before dental procedures.    Warmly,  Margret Chance, MS, PA-C  Department of Orthopaedic Surgery, Joint Reconstruction  [facility], [place]      ----- Message -----   From: [name]   Sent: 04/19/2018 3:19 PM PDT   To: [name], MD  Subject: 20-Other    Dr. Diona Foley, my dentist recently passed away and he would send in Rx for me for Amoxicillin 500 mg to take before any dental procedures. I have found a new dentist but she prefers I get this medication through your office. Would you please send in a Rx for 20 pills for me to my local [facility] at [address] [place], [place]. That will cover me for upcoming cleanings and other procedures. Thank you, Brendia Sacks

## 2019-01-16 ENCOUNTER — Encounter (INDEPENDENT_AMBULATORY_CARE_PROVIDER_SITE_OTHER): Payer: Self-pay | Admitting: Orthopaedic Surgery

## 2019-01-16 DIAGNOSIS — M25561 Pain in right knee: Principal | ICD-10-CM

## 2019-01-22 ENCOUNTER — Inpatient Hospital Stay (INDEPENDENT_AMBULATORY_CARE_PROVIDER_SITE_OTHER): Admit: 2019-01-22 | Discharge: 2019-01-22 | Disposition: A | Discharge status: Home Health | Payer: Medicare Other

## 2019-01-22 ENCOUNTER — Encounter (INDEPENDENT_AMBULATORY_CARE_PROVIDER_SITE_OTHER): Payer: Self-pay | Admitting: Orthopaedic Surgery

## 2019-01-22 ENCOUNTER — Ambulatory Visit (INDEPENDENT_AMBULATORY_CARE_PROVIDER_SITE_OTHER): Payer: Medicare Other | Admitting: Orthopaedic Surgery

## 2019-01-22 VITALS — BP 130/83 | HR 86 | Temp 98.2°F | Ht 67.0 in | Wt 264.0 lb

## 2019-01-22 DIAGNOSIS — M1711 Unilateral primary osteoarthritis, right knee: Principal | ICD-10-CM

## 2019-01-22 DIAGNOSIS — M25761 Osteophyte, right knee: Secondary | ICD-10-CM

## 2019-01-22 DIAGNOSIS — M25562 Pain in left knee: Principal | ICD-10-CM

## 2019-01-22 DIAGNOSIS — G8929 Other chronic pain: Secondary | ICD-10-CM

## 2019-01-22 DIAGNOSIS — W19XXXA Unspecified fall, initial encounter: Secondary | ICD-10-CM

## 2019-01-22 DIAGNOSIS — Z96652 Presence of left artificial knee joint: Secondary | ICD-10-CM

## 2019-01-22 DIAGNOSIS — M11261 Other chondrocalcinosis, right knee: Secondary | ICD-10-CM

## 2019-01-22 DIAGNOSIS — M21161 Varus deformity, not elsewhere classified, right knee: Secondary | ICD-10-CM

## 2019-01-22 DIAGNOSIS — M25561 Pain in right knee: Principal | ICD-10-CM

## 2019-01-22 NOTE — Progress Notes (Signed)
Angela Calhoun, Joint    PRIMARY CARE PROVIDER:   Katheren Puller    CONSULTING or REFERRING PHYSICIAN:   Self, Referred  No address on file    REASON FOR VISIT:   HISTORY: Angela Calhoun is a 73 year old female  who presents today for evaluation of RIGHT knee pain.  Angela Calhoun reports that pain has been present for a few years, but significantly worsened in the past year.  When asked about the location of discomfort Angela Calhoun gestures towards the medial aspect of the knee.   She reports pain with all weight bearing activity.  Sleep and ADLs are affected.    Angela Calhoun has tried otc medications for at least 24 weeks without significant resolution, such as Aleve and tylenol.  Physical therapy was also recommended during this 24 week period.  Angela Calhoun also reports they have tried modifying activities and minimizing weight bearing exercise, but with zero relief.  Patient was also recommended to try assistive device for 6 months.    At this juncture Angela Calhoun is ready to proceed with a surgical solution.    PSH of bilateral THA and left TKA, all done by me.        Past Medical History:   Diagnosis Date   . Arthritis of knee     Left > Right   . Dyslipidemia    . GERD (gastroesophageal reflux disease)    . HTN (hypertension)    . Hypothyroidism    . Left Kidney carcinoma 08/21/2013 sx    s/p Left nephrectomy 08/21/2013: no CHEMO/XRT   . Osteoarthritis of hip     Left and s/p R THR     Past Surgical History:   Procedure Laterality Date   . sp arthroplasty left  total knee  2015   . PB LAPAROSCOPY RADICAL NEPHRECTOMY Left 08/21/2013    Cancer of Left kidney   . s/p Right THR  2011    [place] per pt report with REG   . CHOLECYSTECTOMY, LAP  2008   . PB THYROIDECTOMY TOTAL/COMPLETE  1995    Goiter.  Noncancerous.     . COLONOSCOPY     . Knee I&D  1980's    Traumatic Injury to Knee    . PB BIOPSY OF BREAST, INCISIONAL  1990's   . PB REMOVE TONSILS/ADENOIDS,<13 Y/O       Current Outpatient  Medications   Medication Sig   . acetaminophen (TYLENOL) 325 MG tablet Take 3 tablets (975 mg) by mouth every 6 hours as needed for Mild Pain (Pain Score 1-3).   Marland Kitchen aspirin 325 MG tablet Take 1 tablet (325 mg) by mouth 2 times daily.   . celecoxib (CELEBREX) 100 MG capsule Take 1 capsule (100 mg) by mouth every 12 hours.   . Cholecalciferol (VITAMIN D3) 3000 UNITS tablet Take 1 tablet by mouth daily. Taking  1000 iu   . Cranberry 300 MG TABS Bid   . docusate sodium (COLACE) 100 MG capsule Take 1 capsule (100 mg) by mouth 2 times daily.   Marland Kitchen levothyroxine (LEVOXYL) 137 MCG tablet Take 137 mcg by mouth every morning.   Marland Kitchen lisinopril-hydrochlorothiazide (PRINZIDE, ZESTORETIC) 10-12.5 MG per tablet Take 1 tablet by mouth every morning.   . multivitamin (CENTRUM SILVER) tablet Take 1 tablet by mouth daily.   Marland Kitchen omeprazole (PRILOSEC) 20 MG capsule Take 20 mg by mouth every morning.   Marland Kitchen oxyCODONE (OXYCONTIN) 10 MG Controlled-Release tablet Take  1 tablet (10 mg) by mouth every 12 hours.   Marland Kitchen oxyCODONE (ROXICODONE) 5 MG immediate release tablet Take 1-2 tablets (5-10 mg) by mouth every 4 hours as needed for Moderate Pain (Pain Score 4-6) or Severe Pain (Pain Score 7-10).   . Probiotic Product (PROBIOTIC DAILY PO)    . senna (SENOKOT) 8.6 MG tablet Take 1 tablet (8.6 mg) by mouth daily.   . simvastatin (ZOCOR) 10 MG tablet Take 10 mg by mouth every evening.     No current facility-administered medications for this visit.      Social History     Socioeconomic History   . Marital status: Married     Spouse name: Not on file   . Number of children: 0   . Years of education: Not on file   . Highest education level: Not on file   Occupational History   . Occupation: Retired; Estate agent work for company: [employer]   Social Needs   . Financial resource strain: Not on file   . Food insecurity:     Worry: Not on file     Inability: Not on file   . Transportation needs:     Medical: Not on file     Non-medical: Not on file   Tobacco Use    . Smoking status: Former Smoker     Packs/day: 2.00     Years: 12.00     Pack years: 24.00     Types: Cigarettes     Last attempt to quit: 12/24/1980     Years since quitting: 38.1   . Smokeless tobacco: Never Used   . Tobacco comment: quit smoking in early 1980's after 12 years of 2PPD   Substance and Sexual Activity   . Alcohol use: No   . Drug use: No   . Sexual activity: Yes     Partners: Male   Lifestyle   . Physical activity:     Days per week: Not on file     Minutes per session: Not on file   . Stress: Not on file   Relationships   . Social connections:     Talks on phone: Not on file     Gets together: Not on file     Attends religious service: Not on file     Active member of club or organization: Not on file     Attends meetings of clubs or organizations: Not on file     Relationship status: Not on file   . Intimate partner violence:     Fear of current or ex partner: Not on file     Emotionally abused: Not on file     Physically abused: Not on file     Forced sexual activity: Not on file   Other Topics Concern   . Not on file   Social History Narrative    Married.  She is retired.  She does have no children.  She is a nonsmoker and denies illicit drug use.  She eats no special diet.    Regular diet    Minimal Exercise     family history includes Diabetes in her father; Heart Disease in her brother, father, mother, and sister.  Allergies   Allergen Reactions   . Dermabond [Other] Rash   . Adhesive Tape Rash     Review of Systems: per Intake Sheet    PHYSICAL EXAM:  VITALS: BP 130/83 (BP Location: Left arm, BP Patient Position: Sitting, BP cuff size:  Large)   Pulse 86   Temp 98.2 F (36.8 C) (Oral)   Ht 5\' 7"  (1.702 m)   Wt 119.7 kg (264 lb)   BMI 41.35 kg/m   GENERAL: A+Ox3. INAD. Well appearing and well groomed. Gait: normal  MENTAL STATUS: Pleasant and cooperative. Alert and oriented x3 with normal mood and affect.  Vascular: Pulses are palpable 2+, capillary refills to digits are normal.  Skin:  No wounds/lesions/ulcers. Skin warm & dry.  Respiratory: No respiratory distress    MUSCULOSKELETAL:    Knee:  RIGHT  Skin: dry, pink, warm  Effusion: none  Range of Motion: 5-120 degrees.  Tenderness: none  Stable to Varus/Valgus stress.  Lachman: Normal.  Posterior Drawer: Normal.  McMurray exam: Negative for pain, negative for click.  Patellar Grind: neg  Patellar Stability: Normal.    Muscle Strength: R/L 5/5, R/L 5/5 in the anterior, posterior, lateral group muscles.  Compartments: are soft, no proximal calf tenderness.   SILT saphenous/sural/superficial peroneal/deep peroneal nerves      ==========    IMAGING FINDINGS: I reviewed the patient's imaging today.  Xrays:   MRI:    ==========    ASSESSMENT & PLAN: Angela Calhoun is a 73 year old female with severe right knee OA.  Ready to proceed with surgery.  Consents signed today.    Patient will potentially require up to three nights in the hospital so we may follow patient closely, assess for infection, monitor blood loss and evaluate with physical therapy to determine when patient is safe to go home.  Once it has been determined patient may safely return to their living space without risk of fall or harm to self, they may be discharged from the hospital.    We have discussed clinical and radiographic findings with the patient today. We have discussed conservative treatment which would include activity modifications, reduction, NSAIDs and rest. We have also discussed injections as a treatment option Surgical options have also been presented to the patient.     The patient now has progressed to severe RIGHT KNEE pain with significant limitation in activity due to this pain. Patient has significant pain with simple activities such as walking. Patient has difficulty changing positions or going up or down stairs due to severe pain. The patient also has night pain and pain that affects activities of daily living.   The patient has been educated on extensive  conservative treatment including activity modifications, physical therapy, bracing, oral medications and injections. The patient has been attempting for at least 24 weeks, but has failed conservative treatment options. NSAIDs were not effective or discontinued due to GI or cardiovascular risk profile. Physical therapy has also been offered during this 24 week period of non-operative treatment. Injections were offered and failed to improve symptoms or function. Despite these measures pain has increased and function is decreasing.     The patient currently has severe pain with all passive range of motion of the RIGHT KNEE. There is notable limitation in the range of motion of the joint with active and passive range of motion. The patient has an antalgic gait pattern due to pain and joint dysfunction.   Recent radiographs demonstrate severe degenerative joint disease with joint space narrowing, osteophyte formation and subchondral sclerosis.     The patient is an excellent candidate for RIGHT TOTAL KNEE REPLACEMENT due to increased pain, declining function, physical examination and radiographic evidence of severe osteoarthrtis and failure of conservative measures.     The risks  of surgery were discussed in depth today in clinic. These risks include but are not limited to bleeding, infection, damage to local blood vessels or nerves, possible continued pain, possible stiffness or instability, possible dvt, pe, mi, cva, need for a blood transfusion and even death.     Risks for a blood transfusion were also discussed. These include typical transfusion reactions including HA, nausea, vomiting, flushing, and itching as well as contact with HIV (1:1,000,000) and Hep B/C (1:300,000). Pt would like to go ahead with surgery and signed consents for surgery and blood consent today.    Note prepared with assistance from Gordy Levan, [place]

## 2019-01-22 NOTE — Patient Instructions (Signed)
Joint Replacement Surgery Scheduling   ?   You can start the scheduling process by contacting my surgery scheduler, Isabel Calderon:   Phone: [phone]   Fax: [phone]  E-Mail: [email or web address]   Please only call or e-mail Isabel once.   Due to high volume it can take 48 - 72 hours for her to return your message.     Before contacting Isabel, please have the following information available:   ?   ?  Your potential surgical dates prepared. Be advised that currently Dr. Ball operates on Monday, Wednesday, and Friday   ?  Gather all insurance information   ?  Have contact information (telephone and fax) for your primary care provider, dentist, or any other specialist if applicable. Isabel will need this in order to fax them a letter.  ?  You will also need to get in with your healthcare team for a pre-operative "clearance".   ?   Timing of Surgery:   It takes approximately 8 weeks due to insurance authorization and scheduling space available.   If you require medical "clearance" it could take longer as Isabel will not be able to provide a date of surgery until Dr. Ball receives medical clearance paperwork from your healthcare team.   ?   ?   Preoperative Appointments  . Many patients require an office visit with our anesthesia department in the preoperative care center. This visit typically occurs 1-2 weeks prior to surgery.  . During this visit your past medical history and medications will be reviewed.   . Laboratory testing will be performed at this visit and occasionally additional x-rays or ECG will be needed.  . These visits will be scheduled and coordinated by our surgery scheduler once you have a surgery date.  ?   Preoperative Education   You will be offered the opportunity to attend a preoperative education class and review an online educational program related to your joint replacement surgery. This will be arranged and offered by our surgery scheduler.   ?   ?   [facility]   [address]   [place], [place] [address]   [facility]   [address]   [place], [place] [address]

## 2019-01-29 ENCOUNTER — Telehealth (HOSPITAL_BASED_OUTPATIENT_CLINIC_OR_DEPARTMENT_OTHER): Payer: Self-pay | Admitting: Orthopaedic Surgery

## 2019-01-29 NOTE — Telephone Encounter (Signed)
Called pt to get PCP/Dental information to start surgery scheduling process. Pt does not want sx until Aug. She will call Angela Calhoun back in about 2-3 months to get on the OR schedule for Aug. At that time, we can request m/c

## 2019-05-23 ENCOUNTER — Telehealth (HOSPITAL_BASED_OUTPATIENT_CLINIC_OR_DEPARTMENT_OTHER): Payer: Self-pay | Admitting: Orthopaedic Surgery

## 2019-05-23 NOTE — Telephone Encounter (Signed)
Hi, this is __Erika_____ I am calling from Dr. _Ball_____s office. I  wanted to reach out to you to let you know, due to higher than normal call volumes, our surgery scheduler, __Isabel_____, is diligently working to contact all pts to schedule surgery with Dr. __Ball_______ in a timely manner.? We are checking in to see if you have any questions/concerns regarding the requesting medical clearance?       Called Pt , No answer , LVM   Checking to see if she is still interested in having the surgery and if clearances have been obtained     Have you been able to set an appointment up with your providers to obtain the clearance? If so, when is that appointment set up for?          PCP:_________________________     Dental: ______________________     Other: ______________________

## 2019-05-28 ENCOUNTER — Encounter (HOSPITAL_BASED_OUTPATIENT_CLINIC_OR_DEPARTMENT_OTHER): Payer: Self-pay | Admitting: Orthopaedic Surgery

## 2019-05-29 NOTE — Telephone Encounter (Signed)
From: [name]  To: [name], MD  Sent: 05/28/2019 3:23 PM PDT  Subject: 20-Other    Need to talk with Izabel or Danae Chen re scheduling me for right knee replacement in late Dec. 2020. I left a voicemail message for Izabel on 6.19.20 as I was told she was not in the office that day. Thank you, Brendia Sacks [phone]

## 2019-05-30 NOTE — Telephone Encounter (Signed)
Called patient back to follow up, she states she plans to get her surgery at the end of the year. I advised we would need clearances before we can schedule. The patient said she will call later in the year when she follows up with her PCP/dentist to ensure they receive their clearance letters.

## 2019-06-09 NOTE — Telephone Encounter (Signed)
06/09/2019 RETURNED PTS CALL TO SCHEDULE HER SURGERY FOR DEC, LEFT MESSAGE TO EMAIL ME TO LET ME KNOW WHEN I CAN CALL HER BACK

## 2019-07-16 ENCOUNTER — Telehealth (INDEPENDENT_AMBULATORY_CARE_PROVIDER_SITE_OTHER): Payer: Self-pay | Admitting: Orthopaedic Surgery

## 2019-07-16 NOTE — Telephone Encounter (Addendum)
Angela Calhoun is recommended for R TKA with Dr. Diona Foley. Last Ov 01/22/19. Consents signed but will need to be re-done.   PCP + Dental clearances required.     Called to catch up with [name] who is still interested in scheduling surgery. [name] is scheduled for PCP + dental visit next month.    Dentist: [facility] Ph: [phone] - faxed letter 8/12.  Dr. Katheren Puller - faxed letter 8/12.    I will call again with available surgery dates.

## 2019-07-18 NOTE — Telephone Encounter (Addendum)
Dental clearance received. Scanned.    07/29/19  Spoke with Olin Hauser to catch up on scheduling surgery. Rickey is not ready to discuss dates, and would like to talk again in another month.

## 2019-09-12 NOTE — Telephone Encounter (Signed)
Reached out and left VM for Angela Calhoun. Invited her to call back if she is ready to discuss surgery.  Last Ov 01/22/19.  Consents have expired. Needs med clearance.

## 2019-09-16 NOTE — Telephone Encounter (Signed)
Angela Calhoun called and stated she will get back to me when she is ready to schedule surgery, likely after January 2021.

## 2019-10-04 NOTE — Telephone Encounter (Signed)
Canceling surgery order. In 2021 consents will need to be re-signed.

## 2019-12-06 DIAGNOSIS — Z8616 Personal history of COVID-19: Secondary | ICD-10-CM

## 2019-12-06 HISTORY — DX: Personal history of COVID-19: Z86.16

## 2019-12-19 ENCOUNTER — Encounter: Payer: Self-pay | Admitting: Hospital

## 2020-01-13 ENCOUNTER — Encounter (INDEPENDENT_AMBULATORY_CARE_PROVIDER_SITE_OTHER): Payer: Self-pay | Admitting: Hospital

## 2020-01-13 ENCOUNTER — Encounter (INDEPENDENT_AMBULATORY_CARE_PROVIDER_SITE_OTHER): Payer: Self-pay

## 2020-01-21 ENCOUNTER — Encounter (INDEPENDENT_AMBULATORY_CARE_PROVIDER_SITE_OTHER): Payer: Self-pay | Admitting: Hospital

## 2021-02-18 ENCOUNTER — Other Ambulatory Visit: Payer: Self-pay | Admitting: Obstetrics and Gynecology

## 2021-02-25 ENCOUNTER — Other Ambulatory Visit: Payer: Self-pay | Admitting: Urology

## 2021-02-25 DIAGNOSIS — C642 Malignant neoplasm of left kidney, except renal pelvis: Secondary | ICD-10-CM

## 2021-03-10 ENCOUNTER — Ambulatory Visit (INDEPENDENT_AMBULATORY_CARE_PROVIDER_SITE_OTHER): Payer: Medicare Other | Admitting: Licensed Clinical Social Worker

## 2021-03-10 DIAGNOSIS — F4323 Adjustment disorder with mixed anxiety and depressed mood: Secondary | ICD-10-CM

## 2021-03-10 DIAGNOSIS — Z638 Other specified problems related to primary support group: Secondary | ICD-10-CM | POA: Diagnosis not present

## 2021-03-10 DIAGNOSIS — Z6379 Other stressful life events affecting family and household: Secondary | ICD-10-CM
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
## 2021-03-15 ENCOUNTER — Other Ambulatory Visit: Payer: Self-pay

## 2021-03-24 ENCOUNTER — Other Ambulatory Visit: Payer: Self-pay

## 2021-04-02 ENCOUNTER — Encounter (HOSPITAL_BASED_OUTPATIENT_CLINIC_OR_DEPARTMENT_OTHER): Payer: Self-pay | Admitting: Obstetrics and Gynecology

## 2021-04-02 ENCOUNTER — Other Ambulatory Visit: Payer: Self-pay

## 2021-04-02 NOTE — Progress Notes (Addendum)
ADDENDUM:  Called and spoke w/ pt via phone today.  Per pt arrived at covid testing site as they were closing, rescheduled test for 04-07-2021 @ 1130.  Spoke w/ via phone for pre-op interview--- PT Lab needs dos----  no             Lab results------ pt getting CBC, BMP, EKG done 04-06-2021 @ 1315 COVID test ------ 04-06-2021 @ 1420 Arrive at ------- 0930 on 04-08-2021 NPO after MN NO Solid Food.  Clear liquids from MN until--- 0830 Med rec completed Medications to take morning of surgery ----- Synthryoid Diabetic medication ----- n/a Patient instructed to bring photo id and insurance card day of surgery Patient aware to have Driver (ride ) / caregiver    for 24 hours after surgery -- niece, Otho Najjar Patient Special Instructions ----- n/a Pre-Op special Istructions ----- pt will pick up ensure pre-surgery drink at lab appointment with handout instructions Patient verbalized understanding of instructions that were given at this phone interview. Patient denies shortness of breath, chest pain, fever, cough at this phone interview.

## 2021-04-06 ENCOUNTER — Encounter (HOSPITAL_COMMUNITY)
Admission: RE | Admit: 2021-04-06 | Discharge: 2021-04-06 | Disposition: A | Discharge status: Home / Self Care | Payer: Medicare Other | Source: Ambulatory Visit | Attending: Obstetrics and Gynecology | Admitting: Obstetrics and Gynecology

## 2021-04-06 ENCOUNTER — Other Ambulatory Visit: Payer: Self-pay

## 2021-04-06 ENCOUNTER — Other Ambulatory Visit (HOSPITAL_COMMUNITY): Payer: Medicare Other

## 2021-04-06 DIAGNOSIS — Z01818 Encounter for other preprocedural examination: Secondary | ICD-10-CM | POA: Insufficient documentation

## 2021-04-06 LAB — CBC
HCT: 45.2 % (ref 36.0–46.0)
Hemoglobin: 13.9 g/dL (ref 12.0–15.0)
MCH: 27.6 pg (ref 26.0–34.0)
MCHC: 30.8 g/dL (ref 30.0–36.0)
MCV: 89.7 fL (ref 80.0–100.0)
Platelets: 176 10*3/uL (ref 150–400)
RBC: 5.04 MIL/uL (ref 3.87–5.11)
RDW: 15.8 % — ABNORMAL HIGH (ref 11.5–15.5)
WBC: 8.3 10*3/uL (ref 4.0–10.5)
nRBC: 0 % (ref 0.0–0.2)

## 2021-04-06 LAB — BASIC METABOLIC PANEL
Anion gap: 5 (ref 5–15)
BUN: 14 mg/dL (ref 8–23)
CO2: 29 mmol/L (ref 22–32)
Calcium: 9.4 mg/dL (ref 8.9–10.3)
Chloride: 109 mmol/L (ref 98–111)
Creatinine, Ser: 0.67 mg/dL (ref 0.44–1.00)
GFR, Estimated: >60 mL/min (ref >60)
Glucose, Bld: 81 mg/dL (ref 70–99)
Potassium: 4.5 mmol/L (ref 3.5–5.1)
Sodium: 143 mmol/L (ref 135–145)

## 2021-04-07 ENCOUNTER — Other Ambulatory Visit (HOSPITAL_COMMUNITY)
Admission: RE | Admit: 2021-04-07 | Discharge: 2021-04-07 | Disposition: A | Discharge status: Home / Self Care | Payer: Medicare Other | Source: Ambulatory Visit | Attending: Obstetrics and Gynecology | Admitting: Obstetrics and Gynecology

## 2021-04-07 DIAGNOSIS — Z20822 Contact with and (suspected) exposure to covid-19: Secondary | ICD-10-CM | POA: Insufficient documentation

## 2021-04-07 DIAGNOSIS — Z01812 Encounter for preprocedural laboratory examination: Secondary | ICD-10-CM | POA: Insufficient documentation

## 2021-04-07 LAB — SARS CORONAVIRUS 2 (TAT 6-24 HRS): SARS Coronavirus 2: NEGATIVE

## 2021-04-07 NOTE — H&P (Signed)
   Reason for admission:   Hysteroscopy with D&C and insertion of Mirena  History:    Brooke Duncan a47 y.o.  female,G0, presenting today to undergo hysteroscopy with D&C and  Insertion of Mirena. Patient was evaluated at [facility]  3/17[facility]aa as a NGYN for evaluation of endometrial hyperplasia. She Was referred by Dr Gus Height @ [place] at [facility]. Recently relocated from [place]: pap d/c @ 66 per [facility], mammogram UTD 1 year ago and colonoscopy  Is due TBS.  Menopause at 8. No HRT. No PMB. Fall 2021 TVUS performed by urology for recurrent UTI with findings of  Bladder diverticulum and EM measuring 12 mm. EBX attempt in-office failed DNKA for follow-up.   02/18/21 TVUS @ [facility]: EM is heterogenous at 18.4 mm, normal right ovary, left ovary not well visualized due to bowel gas Bladder diverticulum noted.  1. Morbid obesity with BMI 45 2. CHTJ 3. S/p left neprectomy for renal cell carcinoma 2014 4. S/p thyroidectomy 1996 and parathyroidectomy 2021 5. osteopenia     Review of system:  Non-contributory   Past Medical History:   Past Medical History:  Diagnosis Date  . Acquired solitary kidney    right side 2014  . Arthritis   . Frequency of urination   . Heart murmur    per told by pcp approx. 02/ 2022 heard a very faint murmur, told did need work-up done at this time  . History of Hashimoto thyroiditis    s/p  total thyroidectomy 1995  . History of hypercalcemia    s/p  parathryoidectomy 07/ 2021  . History of renal cell carcinoma 2014   s/p  left nephrectomy, pt stated no other treatment  . Hypertension    followed by pcp  . Hypothyroidism, postsurgical 1995   followed by pcp in [place], moved from [place] 01/ 2021  . OSA on CPAP   . Thickened endometrium   . Wears glasses     Allergies:   Allergies  Allergen Reactions  . Tape Rash and Other (See Comments)    "Blistery rash"    Social History:  Married and former smoker  Family History:    Diabetes: father, brother Prostate cancer: father Cardiac arythmia: father, brother, sister  Physical exam:    General Appearance: Alert, appropriate appearance for age. No acute distress Neck / Thyroid: Supple, no masses, nodes or enlargement Lungs: clear to auscultation bilaterally Back: No CVA tenderness. Cardiovascular: Regular rate and rhythm. S1, S2, no murmur Gastrointestinal: Soft, non-tender, no masses or organomegaly Pelvic Exam:deferred   Assessment:   Thickened endometrium in patient with significant risk factors for endometrial carcinoma. Cervical stenosis   Plan:    Proceed with Hysteroscopy / D&C. Mirena insertion discussed with patient for future endometrial protection. Procedure, risks and benefits  reviewed with patient including but not limited to bleeding, infection, injury to uterus and subsequently to intra-abdominal organs and expected recovery.  Patient voices understanding and is agreeable to proceed.      Delsa Bern A MD

## 2021-04-07 NOTE — Anesthesia Preprocedure Evaluation (Addendum)
Anesthesia Evaluation  Patient identified by MRN, date of birth, ID band Patient awake    Reviewed: Allergy & Precautions, NPO status , Patient's Chart, lab work & pertinent test results  History of Anesthesia Complications Negative for: history of anesthetic complications  Airway Mallampati: II  TM Distance: >3 FB Neck ROM: Full    Dental no notable dental hx.    Pulmonary sleep apnea and Continuous Positive Airway Pressure Ventilation , former smoker,    Pulmonary exam normal        Cardiovascular hypertension, Pt. on medications Normal cardiovascular exam     Neuro/Psych negative neurological ROS  negative psych ROS   GI/Hepatic negative GI ROS, Neg liver ROS,   Endo/Other  Hypothyroidism Morbid obesity  Renal/GU negative Renal ROS  negative genitourinary   Musculoskeletal  (+) Arthritis ,   Abdominal   Peds  Hematology negative hematology ROS (+)   Anesthesia Other Findings Day of surgery medications reviewed with patient.  Reproductive/Obstetrics Thickened endometrium                            Anesthesia Physical Anesthesia Plan  ASA: III  Anesthesia Plan: General   Post-op Pain Management:    Induction: Intravenous  PONV Risk Score and Plan: 4 or greater and Treatment may vary due to age or medical condition, Ondansetron, Dexamethasone and Midazolam  Airway Management Planned: LMA  Additional Equipment: None  Intra-op Plan:   Post-operative Plan: Extubation in OR  Informed Consent: I have reviewed the patients History and Physical, chart, labs and discussed the procedure including the risks, benefits and alternatives for the proposed anesthesia with the patient or authorized representative who has indicated his/her understanding and acceptance.     Dental advisory given  Plan Discussed with: CRNA  Anesthesia Plan Comments:        Anesthesia Quick  Evaluation

## 2021-04-08 ENCOUNTER — Ambulatory Visit (HOSPITAL_BASED_OUTPATIENT_CLINIC_OR_DEPARTMENT_OTHER): Payer: Medicare Other | Admitting: Anesthesiology

## 2021-04-08 ENCOUNTER — Other Ambulatory Visit: Payer: Self-pay

## 2021-04-08 ENCOUNTER — Ambulatory Visit (HOSPITAL_BASED_OUTPATIENT_CLINIC_OR_DEPARTMENT_OTHER)
Admission: RE | Admit: 2021-04-08 | Discharge: 2021-04-08 | Disposition: A | Discharge status: Home / Self Care | Payer: Medicare Other | Attending: Obstetrics and Gynecology | Admitting: Obstetrics and Gynecology

## 2021-04-08 ENCOUNTER — Encounter (HOSPITAL_BASED_OUTPATIENT_CLINIC_OR_DEPARTMENT_OTHER)
Admission: RE | Disposition: A | Discharge status: Home / Self Care | Payer: Self-pay | Source: Home / Self Care | Attending: Obstetrics and Gynecology

## 2021-04-08 ENCOUNTER — Encounter (HOSPITAL_BASED_OUTPATIENT_CLINIC_OR_DEPARTMENT_OTHER): Payer: Self-pay | Admitting: Obstetrics and Gynecology

## 2021-04-08 DIAGNOSIS — Z6841 Body Mass Index (BMI) 40.0 and over, adult: Secondary | ICD-10-CM | POA: Insufficient documentation

## 2021-04-08 DIAGNOSIS — Z87891 Personal history of nicotine dependence: Secondary | ICD-10-CM | POA: Diagnosis not present

## 2021-04-08 DIAGNOSIS — Z780 Asymptomatic menopausal state: Secondary | ICD-10-CM | POA: Insufficient documentation

## 2021-04-08 DIAGNOSIS — M8580 Other specified disorders of bone density and structure, unspecified site: Secondary | ICD-10-CM | POA: Insufficient documentation

## 2021-04-08 DIAGNOSIS — Z905 Acquired absence of kidney: Secondary | ICD-10-CM | POA: Insufficient documentation

## 2021-04-08 DIAGNOSIS — N898 Other specified noninflammatory disorders of vagina: Secondary | ICD-10-CM | POA: Diagnosis not present

## 2021-04-08 DIAGNOSIS — Z8042 Family history of malignant neoplasm of prostate: Secondary | ICD-10-CM | POA: Diagnosis not present

## 2021-04-08 DIAGNOSIS — E890 Postprocedural hypothyroidism: Secondary | ICD-10-CM | POA: Diagnosis not present

## 2021-04-08 DIAGNOSIS — Z87440 Personal history of urinary (tract) infections: Secondary | ICD-10-CM | POA: Insufficient documentation

## 2021-04-08 DIAGNOSIS — I10 Essential (primary) hypertension: Secondary | ICD-10-CM | POA: Insufficient documentation

## 2021-04-08 DIAGNOSIS — R935 Abnormal findings on diagnostic imaging of other abdominal regions, including retroperitoneum: Secondary | ICD-10-CM | POA: Diagnosis present

## 2021-04-08 DIAGNOSIS — R9389 Abnormal findings on diagnostic imaging of other specified body structures: Secondary | ICD-10-CM | POA: Insufficient documentation

## 2021-04-08 DIAGNOSIS — Z8249 Family history of ischemic heart disease and other diseases of the circulatory system: Secondary | ICD-10-CM | POA: Diagnosis not present

## 2021-04-08 DIAGNOSIS — N842 Polyp of vagina: Secondary | ICD-10-CM | POA: Insufficient documentation

## 2021-04-08 DIAGNOSIS — Z85528 Personal history of other malignant neoplasm of kidney: Secondary | ICD-10-CM | POA: Insufficient documentation

## 2021-04-08 HISTORY — DX: Frequency of micturition: R35.0

## 2021-04-08 HISTORY — DX: Obstructive sleep apnea (adult) (pediatric): G47.33

## 2021-04-08 HISTORY — DX: Cardiac murmur, unspecified: R01.1

## 2021-04-08 HISTORY — DX: Abnormal findings on diagnostic imaging of other specified body structures: R93.89

## 2021-04-08 HISTORY — DX: Dependence on other enabling machines and devices: Z99.89

## 2021-04-08 HISTORY — DX: Acquired absence of kidney: Z90.5

## 2021-04-08 HISTORY — DX: Essential (primary) hypertension: I10

## 2021-04-08 HISTORY — DX: Presence of spectacles and contact lenses: Z97.3

## 2021-04-08 HISTORY — DX: Unspecified osteoarthritis, unspecified site: M19.90

## 2021-04-08 HISTORY — DX: Personal history of other endocrine, nutritional and metabolic disease: Z86.39

## 2021-04-08 HISTORY — PX: HYSTEROSCOPY WITH D & C: SHX1775

## 2021-04-08 SURGERY — DILATATION AND CURETTAGE /HYSTEROSCOPY
Anesthesia: General | Site: Vagina

## 2021-04-08 MED ORDER — AMISULPRIDE (ANTIEMETIC) 5 MG/2ML IV SOLN: 10.0000 mg | Freq: Once | INTRAVENOUS | Status: DC | PRN

## 2021-04-08 MED ORDER — ONDANSETRON HCL 4 MG/2ML IJ SOLN
INTRAMUSCULAR | Status: AC
  Filled 2021-04-08: qty 2

## 2021-04-08 MED ORDER — SCOPOLAMINE 1 MG/3DAYS TD PT72
1.0000 | MEDICATED_PATCH | TRANSDERMAL | Status: DC
  Administered 2021-04-08: 1.5 mg via TRANSDERMAL

## 2021-04-08 MED ORDER — GABAPENTIN 300 MG PO CAPS
ORAL_CAPSULE | ORAL | Status: AC
  Filled 2021-04-08: qty 1

## 2021-04-08 MED ORDER — FENTANYL CITRATE (PF) 100 MCG/2ML IJ SOLN: 25.0000 ug | INTRAMUSCULAR | Status: DC | PRN

## 2021-04-08 MED ORDER — EPHEDRINE 5 MG/ML INJ
INTRAVENOUS | Status: AC
  Filled 2021-04-08: qty 10

## 2021-04-08 MED ORDER — LIDOCAINE 2% (20 MG/ML) 5 ML SYRINGE
INTRAMUSCULAR | Status: DC | PRN
  Administered 2021-04-08: 100 mg via INTRAVENOUS

## 2021-04-08 MED ORDER — CELECOXIB 200 MG PO CAPS
400.0000 mg | ORAL_CAPSULE | ORAL | Status: AC
  Administered 2021-04-08: 400 mg via ORAL

## 2021-04-08 MED ORDER — SODIUM CHLORIDE 0.9 % IV SOLN: INTRAVENOUS | Status: DC

## 2021-04-08 MED ORDER — PROPOFOL 10 MG/ML IV BOLUS
INTRAVENOUS | Status: AC
  Filled 2021-04-08: qty 20

## 2021-04-08 MED ORDER — EPHEDRINE SULFATE-NACL 50-0.9 MG/10ML-% IV SOSY
PREFILLED_SYRINGE | INTRAVENOUS | Status: DC | PRN
  Administered 2021-04-08: 10 mg via INTRAVENOUS

## 2021-04-08 MED ORDER — LEVONORGESTREL 20 MCG/DAY IU IUD
INTRAUTERINE_SYSTEM | INTRAUTERINE | Status: AC
  Filled 2021-04-08: qty 1

## 2021-04-08 MED ORDER — OXYCODONE HCL 5 MG/5ML PO SOLN: 5.0000 mg | Freq: Once | ORAL | Status: DC | PRN

## 2021-04-08 MED ORDER — FENTANYL CITRATE (PF) 100 MCG/2ML IJ SOLN
INTRAMUSCULAR | Status: DC | PRN
  Administered 2021-04-08 (×2): 25 ug via INTRAVENOUS
  Administered 2021-04-08: 50 ug via INTRAVENOUS

## 2021-04-08 MED ORDER — ACETAMINOPHEN 500 MG PO TABS
1000.0000 mg | ORAL_TABLET | ORAL | Status: AC
  Administered 2021-04-08: 1000 mg via ORAL

## 2021-04-08 MED ORDER — SCOPOLAMINE 1 MG/3DAYS TD PT72
MEDICATED_PATCH | TRANSDERMAL | Status: AC
  Filled 2021-04-08: qty 1

## 2021-04-08 MED ORDER — SODIUM CHLORIDE 0.9 % IR SOLN
Status: DC | PRN
  Administered 2021-04-08: 3000 mL

## 2021-04-08 MED ORDER — CEFAZOLIN SODIUM-DEXTROSE 2-4 GM/100ML-% IV SOLN
2.0000 g | INTRAVENOUS | Status: AC
  Administered 2021-04-08: 2 g via INTRAVENOUS

## 2021-04-08 MED ORDER — ACETAMINOPHEN 500 MG PO TABS: 1000.0000 mg | ORAL_TABLET | Freq: Four times a day (QID) | ORAL | 0 refills | Status: AC | PRN

## 2021-04-08 MED ORDER — DEXAMETHASONE SODIUM PHOSPHATE 10 MG/ML IJ SOLN
INTRAMUSCULAR | Status: AC
  Filled 2021-04-08: qty 1

## 2021-04-08 MED ORDER — LIDOCAINE 2% (20 MG/ML) 5 ML SYRINGE
INTRAMUSCULAR | Status: AC
  Filled 2021-04-08: qty 5

## 2021-04-08 MED ORDER — CHLOROPROCAINE HCL 1 % IJ SOLN
INTRAMUSCULAR | Status: DC | PRN
  Administered 2021-04-08: .0001 mL

## 2021-04-08 MED ORDER — DEXAMETHASONE SODIUM PHOSPHATE 10 MG/ML IJ SOLN
INTRAMUSCULAR | Status: DC | PRN
  Administered 2021-04-08: 4 mg via INTRAVENOUS

## 2021-04-08 MED ORDER — IBUPROFEN 600 MG PO TABS
600.0000 mg | ORAL_TABLET | Freq: Four times a day (QID) | ORAL | 0 refills | Status: DC | PRN
Start: 2021-04-08 — End: 2023-08-21

## 2021-04-08 MED ORDER — POVIDONE-IODINE 10 % EX SWAB: 2.0000 "application " | Freq: Once | CUTANEOUS | Status: DC

## 2021-04-08 MED ORDER — ENSURE PRE-SURGERY PO LIQD: 296.0000 mL | Freq: Once | ORAL | Status: DC

## 2021-04-08 MED ORDER — LEVONORGESTREL 20 MCG/DAY IU IUD: INTRAUTERINE_SYSTEM | INTRAUTERINE | Status: DC

## 2021-04-08 MED ORDER — CELECOXIB 200 MG PO CAPS
ORAL_CAPSULE | ORAL | Status: AC
  Filled 2021-04-08: qty 2

## 2021-04-08 MED ORDER — MIDAZOLAM HCL 2 MG/2ML IJ SOLN
INTRAMUSCULAR | Status: DC | PRN
  Administered 2021-04-08: 1 mg via INTRAVENOUS

## 2021-04-08 MED ORDER — ACETAMINOPHEN 500 MG PO TABS
ORAL_TABLET | ORAL | Status: AC
  Filled 2021-04-08: qty 2

## 2021-04-08 MED ORDER — ACETAMINOPHEN 500 MG PO TABS: 1000.0000 mg | ORAL_TABLET | Freq: Once | ORAL | Status: AC

## 2021-04-08 MED ORDER — GABAPENTIN 300 MG PO CAPS
300.0000 mg | ORAL_CAPSULE | ORAL | Status: AC
  Administered 2021-04-08: 300 mg via ORAL

## 2021-04-08 MED ORDER — FENTANYL CITRATE (PF) 100 MCG/2ML IJ SOLN
INTRAMUSCULAR | Status: AC
  Filled 2021-04-08: qty 2

## 2021-04-08 MED ORDER — ONDANSETRON HCL 4 MG/2ML IJ SOLN
INTRAMUSCULAR | Status: DC | PRN
  Administered 2021-04-08: 4 mg via INTRAVENOUS

## 2021-04-08 MED ORDER — ESTRADIOL 0.1 MG/GM VA CREA
TOPICAL_CREAM | VAGINAL | Status: DC | PRN
  Administered 2021-04-08: 1 via VAGINAL

## 2021-04-08 MED ORDER — CEFAZOLIN SODIUM-DEXTROSE 2-4 GM/100ML-% IV SOLN
INTRAVENOUS | Status: AC
  Filled 2021-04-08: qty 100

## 2021-04-08 MED ORDER — MIDAZOLAM HCL 2 MG/2ML IJ SOLN
INTRAMUSCULAR | Status: AC
  Filled 2021-04-08: qty 2

## 2021-04-08 MED ORDER — PROPOFOL 10 MG/ML IV BOLUS
INTRAVENOUS | Status: DC | PRN
  Administered 2021-04-08: 200 mg via INTRAVENOUS

## 2021-04-08 MED ORDER — OXYCODONE HCL 5 MG PO TABS: 5.0000 mg | ORAL_TABLET | Freq: Once | ORAL | Status: DC | PRN

## 2021-04-08 SURGICAL SUPPLY — 19 items
CANISTER SUCT 3000ML PPV (MISCELLANEOUS) ×2 IMPLANT
CATH ROBINSON RED A/P 16FR (CATHETERS) ×2 IMPLANT
CLOTH BEACON ORANGE TIMEOUT ST (SAFETY) ×2 IMPLANT
COVER WAND RF STERILE (DRAPES) ×2 IMPLANT
DILATOR CANAL MILEX (MISCELLANEOUS) ×2 IMPLANT
ELECT REM PT RETURN 9FT ADLT (ELECTROSURGICAL) ×2
ELECTRODE REM PT RTRN 9FT ADLT (ELECTROSURGICAL) ×1 IMPLANT
GLOVE SURG NEOP MICRO LF SZ6.5 (GLOVE) ×4 IMPLANT
GOWN STRL REUS W/TWL LRG LVL3 (GOWN DISPOSABLE) ×2 IMPLANT
IV NS IRRIG 3000ML ARTHROMATIC (IV SOLUTION) ×4 IMPLANT
KIT PROCEDURE FLUENT (KITS) ×2 IMPLANT
KIT TURNOVER CYSTO (KITS) ×2 IMPLANT
PACK VAGINAL MINOR WOMEN LF (CUSTOM PROCEDURE TRAY) ×2 IMPLANT
PAD OB MATERNITY 4.3X12.25 (PERSONAL CARE ITEMS) ×2 IMPLANT
PAD PREP 24X48 CUFFED NSTRL (MISCELLANEOUS) ×2 IMPLANT
PENCIL SMOKE EVACUATOR (MISCELLANEOUS) ×2 IMPLANT
SEAL ROD LENS SCOPE MYOSURE (ABLATOR) ×2 IMPLANT
TOWEL OR 17X26 10 PK STRL BLUE (TOWEL DISPOSABLE) ×4 IMPLANT
WATER STERILE IRR 500ML POUR (IV SOLUTION) ×2 IMPLANT

## 2021-04-08 NOTE — Discharge Instructions (Signed)
  Post Anesthesia Home Care Instructions  Activity: Get plenty of rest for the remainder of the day. A responsible individual must stay with you for 24 hours following the procedure.  For the next 24 hours, DO NOT: -Drive a car -Paediatric nurse -Drink alcoholic beverages -Take any medication unless instructed by your physician -Make any legal decisions or sign important papers.  Meals: Start with liquid foods such as gelatin or soup. Progress to regular foods as tolerated. Avoid greasy, spicy, heavy foods. If nausea and/or vomiting occur, drink only clear liquids until the nausea and/or vomiting subsides. Call your physician if vomiting continues.  Special Instructions/Symptoms: Your throat may feel dry or sore from the anesthesia or the breathing tube placed in your throat during surgery. If this causes discomfort, gargle with warm salt water. The discomfort should disappear within 24 hours.  If you had a scopolamine patch placed behind your ear for the management of post- operative nausea and/or vomiting:  1. The medication in the patch is effective for 72 hours, after which it should be removed.  Wrap patch in a tissue and discard in the trash. Wash hands thoroughly with soap and water. 2. You may remove the patch earlier than 72 hours if you experience unpleasant side effects which may include dry mouth, dizziness or visual disturbances. 3. Avoid touching the patch. Wash your hands with soap and water after contact with the patch.    POST-OPERATIVE INSTRUCTIONS TO PATIENT  Call [facility]  [phone])  for excessive pain, bleeding or temperature greater than or equal to 100.4 degrees (orally).    No driving for 1 day No sexual activity for 4 weeks  Pain management:  Use Ibuprofen 600 mg every 6 hours with Acetaminophen 1000 mg every 6 hours as needed. Use your pain medication as needed to maintain a pain level at or below 3/10 Use Colace 1-2 capsules per day as  long as you are using pain medication to avoid constipation.       Diet: normal  Bathing: may shower day after surgery  Wound Care: may apply Estrogen cream twice daily as needed  Return to Dr. Cletis Media on as scheduled      Dede Query Rivard MD 04/08/2021 2:04 PM

## 2021-04-08 NOTE — Anesthesia Procedure Notes (Signed)
Procedure Name: LMA Insertion Date/Time: 04/08/2021 12:28 PM Performed by: Mechele Claude, CRNA Pre-anesthesia Checklist: Patient identified, Emergency Drugs available, Suction available and Patient being monitored Patient Re-evaluated:Patient Re-evaluated prior to induction Oxygen Delivery Method: Circle system utilized Preoxygenation: Pre-oxygenation with 100% oxygen Induction Type: IV induction Ventilation: Mask ventilation without difficulty LMA: LMA inserted LMA Size: 4.0 Number of attempts: 1 Airway Equipment and Method: Bite block Placement Confirmation: positive ETCO2 Tube secured with: Tape Dental Injury: Teeth and Oropharynx as per pre-operative assessment

## 2021-04-08 NOTE — Op Note (Signed)
Preop diagnosis: thickened endometrium, vaginal lesion  Postop diagnosis: same and severe cervical stenosis  Anesthesia: IV sedation  Anesthesiologist: Dr. Daiva Huge  Procedure: Removal of vaginal lesion, Hysteroscopy, D&C  Surgeon: Dr. Katharine Look Faheem Ziemann  Procedure: After being informed of the planned procedure with possible complications including bleeding, infection and uterine perforation, informed consent was obtained and patient was taken to or #5.  She was given IV sedation anesthesia without complication. She was placed in a lithotomy position, prepped and draped in the sterile fashion and her bladder was emptied with a red rubber catheter. Pelvic exam reveals Anteverted normal uterus and no pelvic mass..  A weighted speculum is inserted in the vagina. In order to reach the cervix, a 3 cm pedunculated anterior wall was removed. The pedicle was sutured with a figure-of-eight suture of 0 Vicryl. The cervix was located with difficulty then grasped with a tenaculum forcep placed on the anterior lip. The severe cervical stenosis required lacrymal dilators to identify the cervical os.  The cervix is then dilated using Hegar dilator until # 23.Uterus is sounded at 6 cm.This  allows for easy placement of a diagnostic hysteroscope. With perfusion of NS at a maximum pressure of 100 mmHg, we are able to evaluate the entire uterine cavity.  Observation: a 2 cm pedunculated mass is seen on the anterior wall of the uterus. The cavity is otherwise obliterated by diffuse synechia. The hysteroscope is removed.   Using a sharp curette, we proceed with curettage of the endometrial cavity which returns a small amount of normal-appearing endometrium.  Instruments are then removed. A superficial right vulvar laceration is repaired with interrupted sutures of 3-0 Vicryl. Estrace cream is applied to the vulva and perineum.  Instrument and sponge count is complete x2. Estimated blood loss is 25 cc.   The procedure is  very well tolerated by the patient who is taken to recovery room in a well and stable condition.  Specimen: Endometrial curettings and vaginal lesion sent to pathology.

## 2021-04-08 NOTE — Transfer of Care (Addendum)
Immediate Anesthesia Transfer of Care Note  Patient: Brooke Duncan  Procedure(s) Performed: Procedure(s) (LRB): DILATATION AND CURETTAGE /HYSTEROSCOPY, EXCISION OF VAGINAL LESION (N/A)  Patient Location: PACU  Anesthesia Type: General  Level of Consciousness: awake, alert  and oriented  Airway & Oxygen Therapy: Patient Spontanous Breathing and Patient connected to face mask oxygen  Post-op Assessment: Report given to PACU RN and Post -op Vital signs reviewed and stable  Post vital signs: Reviewed and stable  Complications: No apparent anesthesia complications  Last Vitals:  Vitals Value Taken Time  BP 144/91 04/08/21 1354  Temp 37 C 04/08/21 1354  Pulse 90 04/08/21 1356  Resp 23 04/08/21 1356  SpO2 100 % 04/08/21 1356  Vitals shown include unvalidated device data.  Last Pain:  Vitals:   04/08/21 1354  TempSrc:   PainSc: 0-No pain      Patients Stated Pain Goal: 5 (01/74/94 4967)  Complications: No complications documented.

## 2021-04-08 NOTE — Interval H&P Note (Signed)
History and Physical Interval Note:  04/08/2021 12:19 PM  Brooke Duncan  has presented today for surgery, with the diagnosis of Thickened endometrium.  The various methods of treatment have been discussed with the patient and family. After consideration of risks, benefits and other options for treatment, the patient has consented to  Procedure(s): DILATATION AND CURETTAGE /HYSTEROSCOPY (N/A) as a surgical intervention with insertion of Mirena.  The patient's history has been reviewed, patient examined, no change in status, stable for surgery.  I have reviewed the patient's chart and labs.  Questions were answered to the patient's satisfaction.     Katharine Look A Karron Goens

## 2021-04-09 ENCOUNTER — Encounter (HOSPITAL_BASED_OUTPATIENT_CLINIC_OR_DEPARTMENT_OTHER): Payer: Self-pay | Admitting: Obstetrics and Gynecology

## 2021-04-09 LAB — SURGICAL PATHOLOGY

## 2021-04-09 NOTE — Anesthesia Postprocedure Evaluation (Signed)
Anesthesia Post Note  Patient: Brooke Duncan  Procedure(s) Performed: DILATATION AND CURETTAGE /HYSTEROSCOPY, EXCISION OF VAGINAL LESION (N/A Vagina )     Patient location during evaluation: PACU Anesthesia Type: General Level of consciousness: awake and alert and oriented Pain management: pain level controlled Vital Signs Assessment: post-procedure vital signs reviewed and stable Respiratory status: spontaneous breathing, nonlabored ventilation and respiratory function stable Cardiovascular status: blood pressure returned to baseline Postop Assessment: no apparent nausea or vomiting Anesthetic complications: no   No complications documented.  Last Vitals:  Vitals:   04/08/21 1430 04/08/21 1500  BP: (!) 142/75 (!) 146/85  Pulse: 78 81  Resp: 15 16  Temp:  36.8 C  SpO2: 93% 99%    Last Pain:  Vitals:   04/08/21 1500  TempSrc:   PainSc: [name]

## 2021-04-13 ENCOUNTER — Ambulatory Visit (INDEPENDENT_AMBULATORY_CARE_PROVIDER_SITE_OTHER): Payer: Medicare Other | Admitting: Licensed Clinical Social Worker

## 2021-04-13 ENCOUNTER — Other Ambulatory Visit: Payer: Self-pay

## 2021-04-13 DIAGNOSIS — F4323 Adjustment disorder with mixed anxiety and depressed mood: Secondary | ICD-10-CM | POA: Diagnosis not present

## 2021-04-13 NOTE — Progress Notes (Signed)
Virtual Visit via Video Note  I connected with Brooke Duncan on 04/13/21 at 11:00 AM EDT by a video enabled telemedicine application and verified that I am speaking with the correct person using two identifiers.  Location: Patient: home Provider: home office   I discussed the limitations of evaluation and management by telemedicine and the availability of in person appointments. The patient expressed understanding and agreed to proceed.   I discussed the assessment and treatment plan with the patient. The patient was provided an opportunity to ask questions and all were answered. The patient agreed with the plan and demonstrated an understanding of the instructions.   The patient was advised to call back or seek an in-person evaluation if the symptoms worsen or if the condition fails to improve as anticipated.  I provided 53 minutes of non-face-to-face time during this encounter.  THERAPIST PROGRESS NOTE  Session Time: 11:00 AM to 11:53 AM  Participation Level: Active  Behavioral Response: CasualAlertappropriate  Type of Therapy: Individual Therapy  Treatment Goals addressed:  Therapist provide feedback and be a sounding board for patient that her strategies of coping are effective for managing the situation, utilize therapy for emotional regulation to process feelings and therapist educate patient and on other coping strategies helpful, provide supportive interventions patient has a caretaker Interventions: Solution Focused, Strength-based, Supportive and Other: coping  Summary: Brooke Duncan is a 75 y.o. female who presents with check in that it is a a day to day thing. Kind of depends on what happens during the day whether it is more challenging. He is starting to experience some incontinence at night more of a challenge. Wanting for him to see him a neurologist. A couple of months of waiting to get appointment. Moved it out to June. Concerns her only in that he takes a  medication one thing that helps with his dementia-which is the temporal left side of the brain affects temperament. Times where he is very argumentative not himself. Has been on it for two years and needs to see whether they need to increase dosage. He can be argumentative, doesn't have patience no concept how long something takes. Something doesn't happen quickly loses patience.  Discussed worry is in progression of dementia and patient thinks he may be in the middle stage. When try to explain things don't think he is processing the words don't have an understanding of what saying.  Hopefully this next appointment can tell doctor what is experiencing and be able to see him alone to do that. Don't feel like she has to watch all the time. Think he thinks he can do things and try to do them, he was always so independent and make things. He still feels like he can do things he gets confused. Patient helps and sometimes he is more receptive and goes all right other times won't think patient knows what she is talking about patient just wants to walk away. Brother-in-law passed from Alzheimer's. Had to explain to husband and next day didn't make a difference she doesn't think. Sister-in-law and her talk and can be supportive since in a similar situation. They were married for 54 years this August and sister-in-law ultimately looking at it as a blessing nothing to help him and got worse and worse. They had a celebration of life before he passed. Family members wrote letters. Fun time to be together.  Therapist noted this is a very helpful way to manage grief. She is still helping, patient's husband is her brother. Wants  to know how things are. Strong family surrounding patient. Has a niece, grandniece and grand nephew in-law. They are helpful. Niece daughter of older brother patient 62 when born like a little sister. Really close all their life. Nice they are close together again and can see each other every week. Patient  recognizes and shares potentially getting help at some point for her husband. Thinks she is doing ok. Tomorrow will get sewing machine to start sewing. Going to make a quilt for great grand nephew. For the most part thinks she is doing ok. Not  up all night worrying. Was that way before with so much going when packing and moving but feel more settled since been here for 6 months. Shares it is going good, life is not as hectic. Following up with neighbor about putting in a screen porch.    Therapist reviewed symptoms, facilitated expression of thoughts and feelings, reviewed treatment plan and patient gave consent to complete virtually.  Noted patient using sessions to help process feelings labeled them accurately and work through them as a way to cope with them specifically helpful as patient dealing with significant stressors as a caretaker husband with dementia and worsening symptoms.  Noted patient having supports in place as helpful for coping.  Encourage patient and self-care engage in activities she enjoys is helpful for coping and provided positive feedback for patient getting into activities sewing which she enjoys.  Validated patient how she was feeling managing her stressor of being a caretaker with husband with dementia having qualities such as losing patience, losing ability to be able to process things.  Noted helpfulness managing stressor of patient being able to settle down after moving, having been here for 6 months.  Therapist provided active listening, open questions, supportive interventions. Suicidal/Homicidal: No  Plan: Return again in 5 weeks.2.  Therapist provided feedback for helpful coping strategies for patient as well as processing feelings to help with mood regulation, provide supportive interventions  Diagnosis: Axis I:  adjustment reaction with anxiety and depression, husband unwell    Axis II: No diagnosis    Cordella Register, LCSW 04/13/2021

## 2021-04-19 ENCOUNTER — Ambulatory Visit (HOSPITAL_COMMUNITY): Payer: Medicare Other | Admitting: Licensed Clinical Social Worker

## 2021-04-19 DIAGNOSIS — Z6379 Other stressful life events affecting family and household: Secondary | ICD-10-CM

## 2021-04-19 DIAGNOSIS — Z638 Other specified problems related to primary support group: Secondary | ICD-10-CM

## 2021-04-19 DIAGNOSIS — F4323 Adjustment disorder with mixed anxiety and depressed mood: Secondary | ICD-10-CM

## 2021-04-19 NOTE — Progress Notes (Signed)
Virtual Visit via Video Note  I connected with Brooke Duncan on 04/19/21 at  1:00 PM EDT by a video enabled telemedicine application and verified that I am speaking with the correct person using two identifiers.  Location: Patient: home Provider: home office   I discussed the limitations of evaluation and management by telemedicine and the availability of in person appointments. The patient expressed understanding and agreed to proceed.  I discussed the assessment and treatment plan with the patient. The patient was provided an opportunity to ask questions and all were answered. The patient agreed with the plan and demonstrated an understanding of the instructions.   The patient was advised to call back or seek an in-person evaluation if the symptoms worsen or if the condition fails to improve as anticipated.  I provided 10 minutes of non-face-to-face time during this encounter.  THERAPIST PROGRESS NOTE  Session Time: 1:00 PM to 1:10 PM  Participation Level: Active  Behavioral Response: CasualAlertEuthymic  Type of Therapy: Individual Therapy  Treatment Goals addressed:  Therapist provide feedback and be a sounding board for patient that her strategies of coping are effective for managing the situation, utilize therapy for emotional regulation to process feelings and therapist educate patient and on other coping strategies helpful, provide supportive interventions patient has a caretaker Interventions: Supportive  Summary: Brooke Duncan is a 75 y.o. female who presents with just talked last week no new issues and has an appointment for next week. Shares she is doing good and doesn't need to talk this session. Will look over website for next session to help with coping skills [email or web address]-[email or web address]   Suicidal/Homicidal: No  Plan: Return again in 1 week.2.Look at website for caregivers for dementia patients for coping,  continue to help process feelings for coping  Diagnosis: Axis I:  adjustment reaction with anxiety and depression, husband unwell    Axis II: No diagnosis    Cordella Register, LCSW 04/19/2021

## 2021-04-27 ENCOUNTER — Other Ambulatory Visit: Payer: Self-pay

## 2021-04-27 ENCOUNTER — Ambulatory Visit (HOSPITAL_COMMUNITY): Payer: Medicare Other | Admitting: Licensed Clinical Social Worker

## 2021-04-27 NOTE — Progress Notes (Signed)
  Therapist contacted patient by text for a session and she did not respond. Session is a no show

## 2021-05-19 ENCOUNTER — Ambulatory Visit
Admission: RE | Admit: 2021-05-19 | Discharge: 2021-05-19 | Disposition: A | Discharge status: Home / Self Care | Payer: Medicare Other | Source: Ambulatory Visit | Attending: Urology | Admitting: Urology

## 2021-05-19 DIAGNOSIS — C642 Malignant neoplasm of left kidney, except renal pelvis: Secondary | ICD-10-CM

## 2021-05-20 ENCOUNTER — Ambulatory Visit (HOSPITAL_COMMUNITY): Payer: Medicare Other | Admitting: Licensed Clinical Social Worker

## 2021-06-22 ENCOUNTER — Ambulatory Visit (INDEPENDENT_AMBULATORY_CARE_PROVIDER_SITE_OTHER): Payer: Medicare Other | Admitting: Licensed Clinical Social Worker

## 2021-06-22 DIAGNOSIS — Z638 Other specified problems related to primary support group: Secondary | ICD-10-CM

## 2021-06-22 DIAGNOSIS — Z6379 Other stressful life events affecting family and household: Secondary | ICD-10-CM

## 2021-06-22 DIAGNOSIS — F4323 Adjustment disorder with mixed anxiety and depressed mood: Secondary | ICD-10-CM | POA: Diagnosis not present

## 2021-06-22 NOTE — Progress Notes (Signed)
Virtual Visit via Video Note  I connected with Brooke Duncan on 06/22/21 at  9:00 AM EDT by a video enabled telemedicine application and verified that I am speaking with the correct person using two identifiers.  Location: Patient: home Provider: home office   I discussed the limitations of evaluation and management by telemedicine and the availability of in person appointments. The patient expressed understanding and agreed to proceed.   I discussed the assessment and treatment plan with the patient. The patient was provided an opportunity to ask questions and all were answered. The patient agreed with the plan and demonstrated an understanding of the instructions.   The patient was advised to call back or seek an in-person evaluation if the symptoms worsen or if the condition fails to improve as anticipated.  I provided 54 minutes of non-face-to-face time during this encounter.  THERAPIST PROGRESS NOTE  Session Time: 9:00 AM to 9:54 AM  Participation Level: Active  Behavioral Response: CasualAlertEuthymicdescribes stress of caretaking  Type of Therapy: Individual Therapy  Treatment Goals addressed:  Therapist provide feedback and be a sounding board for patient that her strategies of coping are effective for managing the situation, utilize therapy for emotional regulation to process feelings and therapist educate patient and on other coping strategies helpful, provide supportive interventions patient has a caretaker Interventions: CBT, Solution Focused, Strength-based, Supportive, and Other: coping  Summary: Brooke Duncan is a 75 y.o. female who presents with husband has started develop Parkinson's symptoms. Neurologist had caregivers come to the home. One is a speech therapist. Help him to say the words can't get out and that has been good. Has had a little bit of physical therapy. It causes husband frustration not easy to do patient thinks because of brain not because he can't  do it physically even though manifests physically. So he doesn't see the point even though these exercises help parkinson's from getting worse. Social service contacted them. Was going to come to fill out paperwork for [place] but hasn't come back. Also talk about other resources such as respite and Medicaid.  She will call the office, neurologist office to let them know have not heard back. Discussed struggles as a caregiver tries to tell her something says words but he doesn't put something together to make sense of it because he doesn't have specifics. Like he says you know the thing over there patient says don't know what talking about he says why you should know? She tries to find ways to figure it out by asking questions to get at what he is saying. He gets frustrated not understand what telling shift to her that she is the problem with communicating. That is where the struggle is when those type of things come up so frustrations depend on when these thngs come up. Can be an "ass". He will think about it and say sometimes sorry later. Grand niece her husband and two kids staying with them for 8 weeks while their house is being refurbished. Fun having the little ones but also a one year old and a 74 year old. It is helpful when they are here as they also go to work. 74 year old goes to day care so not here all the time. Little one goes three days a week. It is much better with they can go to daycare when everyone comes home evenings get a little crazy. For husband, Simona Huh, goes to bedroom if gets too much. Explored how she is taking care of herself. Every  once in awhile got a respite.  Describes getting a mani-pedi, haircut, grandniece's mom fixed her dinner.  Knows to put this regularly in her schedule. Reviewed how her husband is right now in terms of disease progression, still early stage memory isn't bad and he can function on his own.  He can't necessarily say where he wants to go but notices he can relate  and recognizes things versus point where dementia where don't recognize and relate to things. Discussed having times they have fun met niece and friend and had lunch, Simona Huh came too he enjoyed himself too. Also good to get out of the house. Watch programs "[employer], funny movie found-noted how helpful humor is. Does think she is able to notice warning sign of frustration and able to calm down for a minute or two. Walk away and goes an look at something on computer Looks at pictures she enjoys looking at. If she can keep husband occupied can go and mess around the fabrics. Explored other strategies for managing frustration she prays. Does relaxation through praying everyday. Sometimes a couple of minutes asking for strength to help her get through what needs through, helps her. Discussed keeping husband busy. He could do anything lost ability to discern doing a task what goes with what and how to use a tool. Tries to get him interested in puzzles. Not interested in that. Started going on in a walk. She takes her walker doesn't go far but nice to get out and walk. Explored activities he can do, used to enjoy vacuum cleaner now can't figure out and when it happens he thinks he is broken it but hasn't. Show him something and then he thinks she thinks he is stupid. Family reminds of her strengths as a caregiver and say for example she has a lot of patience.  Patient reminds herself of her own strengths. Tested daily asks herself can I keep it up? Shift her brain reality that other people who have it worse than yourself. Explored other things to keep him occupy will sit on rockers and watch the neighborhood. Sometimes goes to the future tries to reign herself in quickly if let go and becomes a norm. Thinks it would be helpful to check in because things there can be unpredictable periods where struggling reach out and someone available like a lifeline.    Therapist reviewed symptoms, facilitated expression  of thoughts and feelings validated on difficulties that, being a caretaker for somebody who has earlier stage dementia.  Assess helpful for patient to talk about situations that are frustrating to work through feelings and did that in session for example struggles with communication, his taking out frustrations on her when he cannot do things he used to do. Work with patient on coping strategies utilizing website family caregiver alliance page titled "dementia caregiving and controlling frustration.  Validated patient on frustrations that naturally come out from being a caregiver.  Noted important to have coping skills in place as it impacts both her and the person that she is caring about.  The importance of recognizing what she can and cannot change.  That frustration often arises out of trying to change and uncontrollable situation knowing this is a trigger can be helpful, behaviors often associated with dementia like wandering or asking questions repeatedly can be frustrating but are uncontrollable [place] for people with dementia fortunately cannot simply change the behavior of a person suffering from dementia.  You can control is how you respond to the  circumstance.  Noted recognizing your warning signs and intervening to calm yourself down physically.  Noted patient putting in place the strategies, utilizing her computer praying is helpful and also noted regular calming strategies through regular prayer helpful so that she is not so easily triggered.  Explained helpful to do these calming strategies as when we calm we can act from a more rational place then 1 more emotional so we can manage the situation better.  Discussed modifying her thoughts can help reduce frustration noted certain cognitive distortions that can be a source of frustration such as overgeneralization, counting the positive, being to conclusions.  Noted ways patient has done this including providing her own positive input as well as family  helping her with identifying her patients as a caregiver also recognizing that things could be a lot worse.  Encourage patient with regular self-care that she appears to be engaging in, also explored activities for her husband that would be helpful for his mood that would impact her in a positive way.  Therapist provided active listening open questions, supportive interventions. Suicidal/Homicidal: No  Plan: Return again in 4 weeks.2.  Continue with websites related to skills for caregiving as well as processing feelings to help patient cope with stressors of caregiving, help with coping and general  Diagnosis: Axis I:  adjustment reaction with anxiety and depression, husband unwell    Axis II: No diagnosis    Cordella Register, LCSW 06/22/2021

## 2021-06-23 IMAGING — US US RENAL
1 series · 14 of 25 positions shown · non-contrast
Comparison: None.

CLINICAL DATA: Previous renal cell carcinoma, status post left
nephrectomy

EXAM:
RENAL / URINARY TRACT ULTRASOUND COMPLETE

[Series 1: us renal · 0.23mm/px · 14 of 40 slices shown]
[im 1/40]
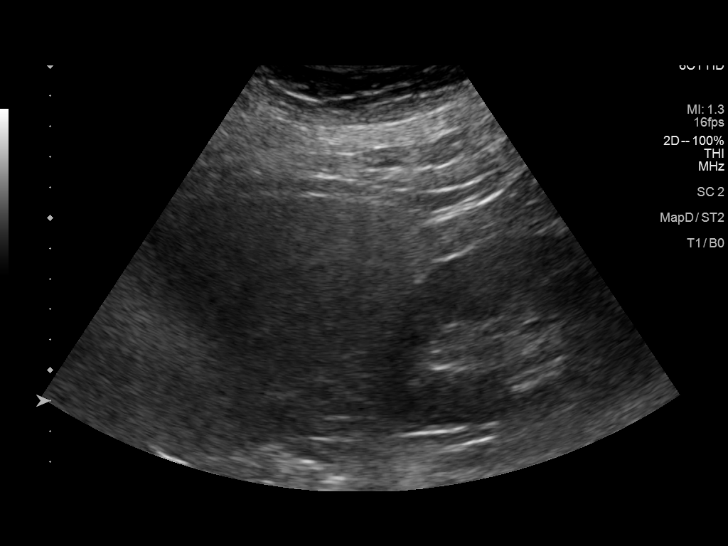
[im 4/40]
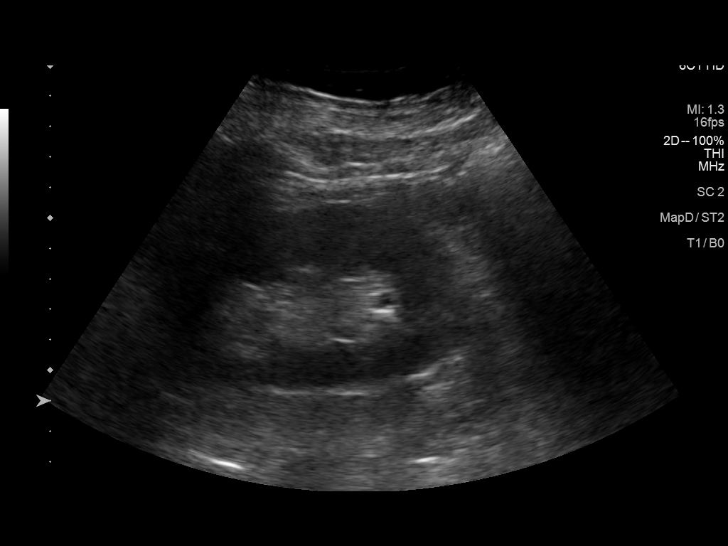
[im 7/40]
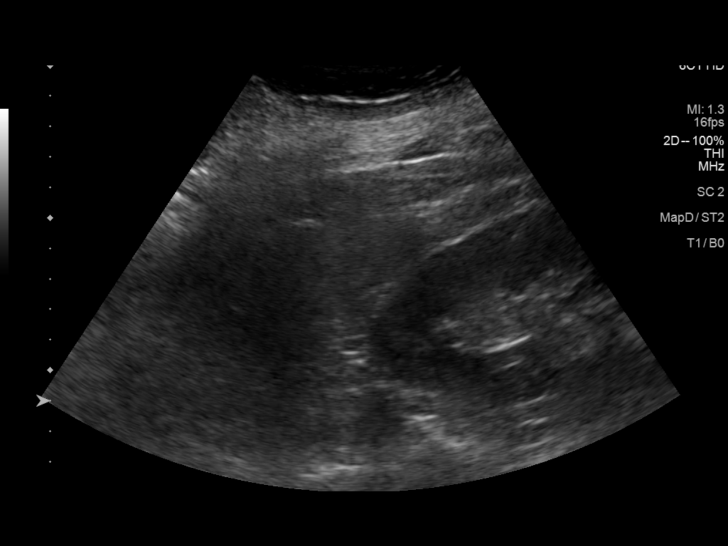
[im 10/40]
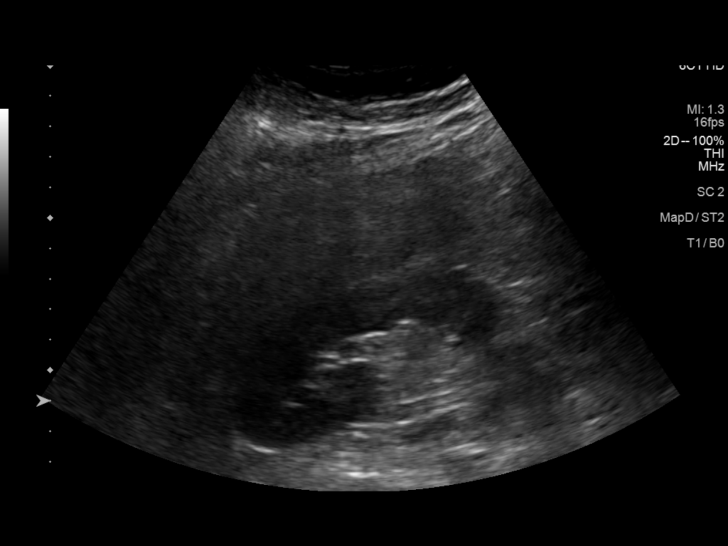
[im 14/40]
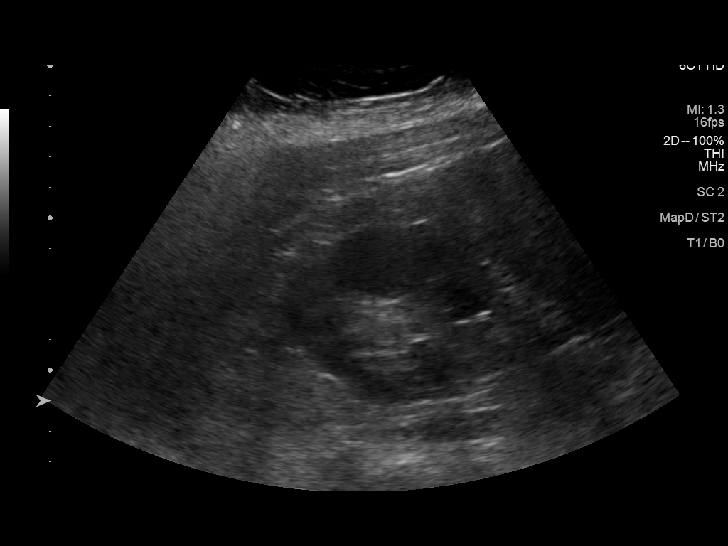
[im 15/40]
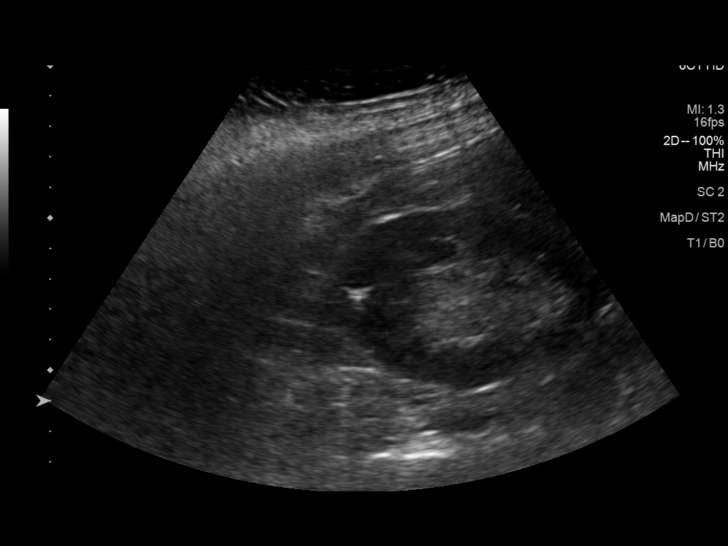
[im 18/40]
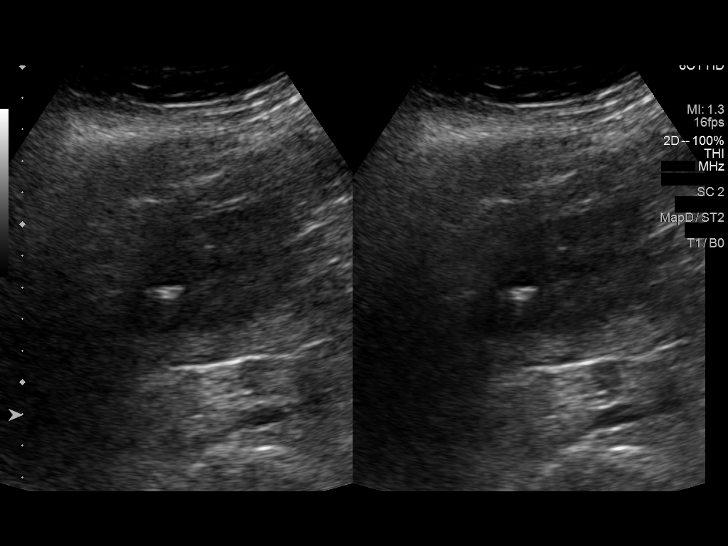
[im 22/40]
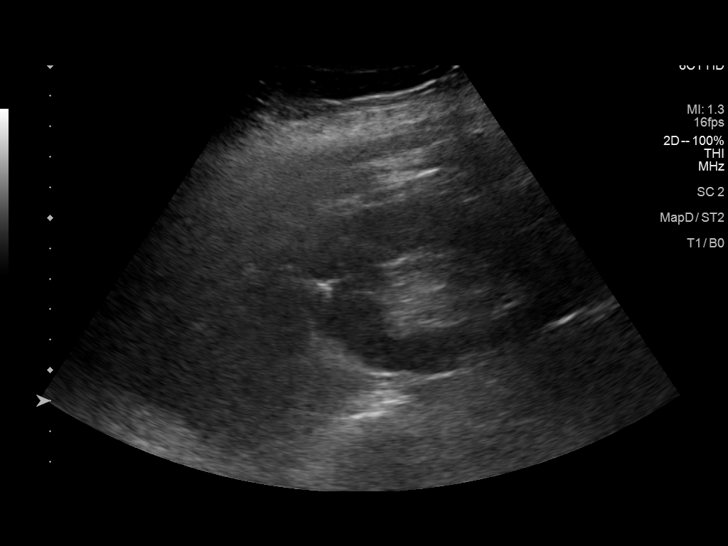
[im 25/40]
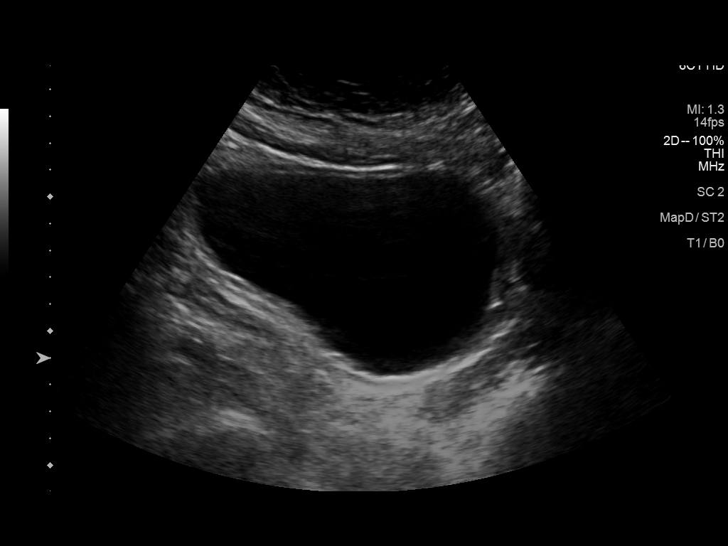
[im 27/40]
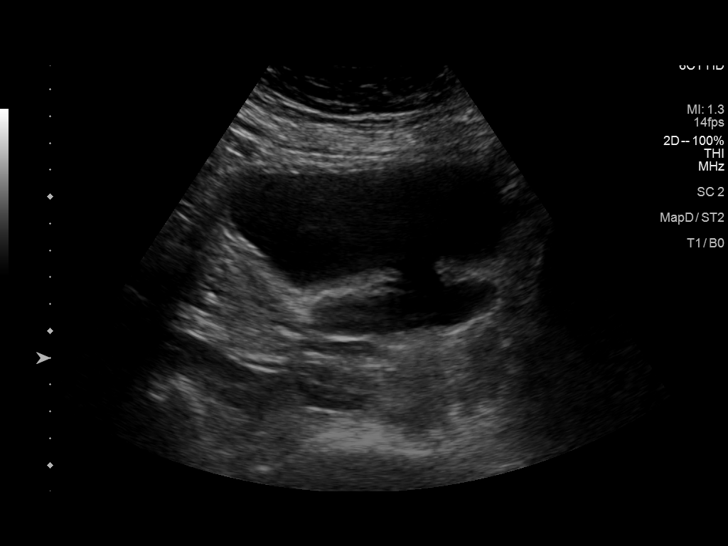
[im 30/40]
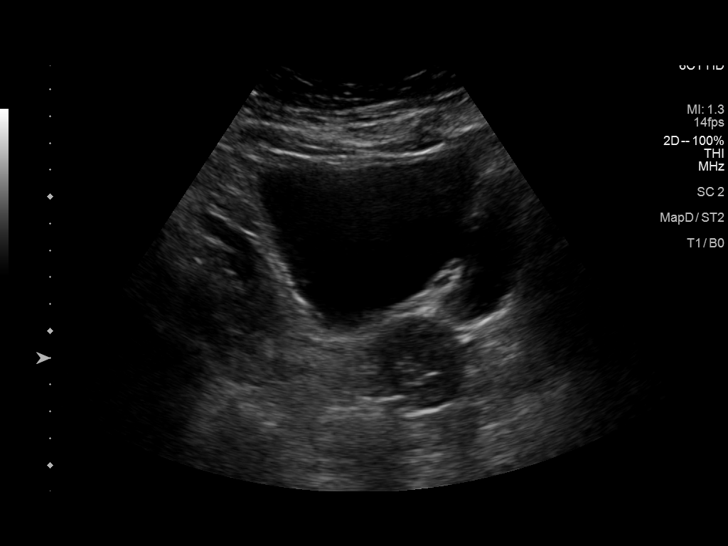
[im 33/40]
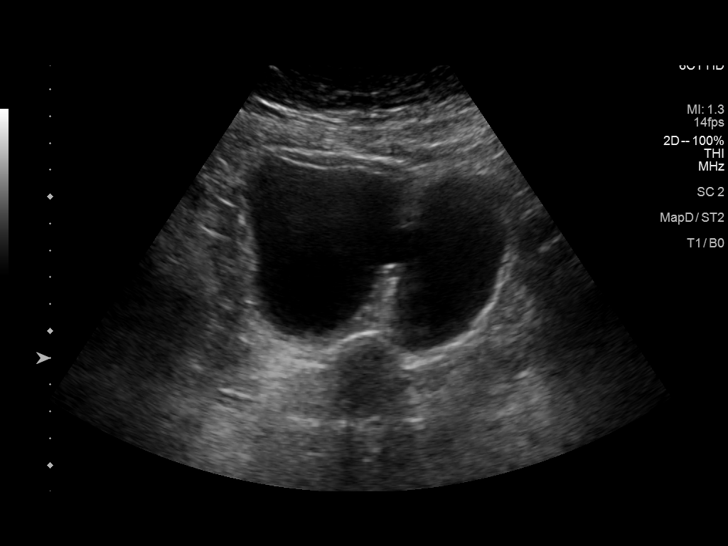
[im 36/40]
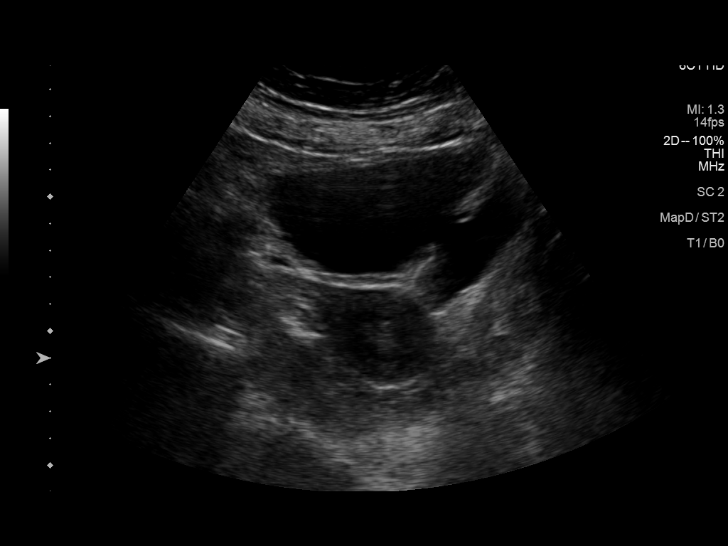
[im 40/40]
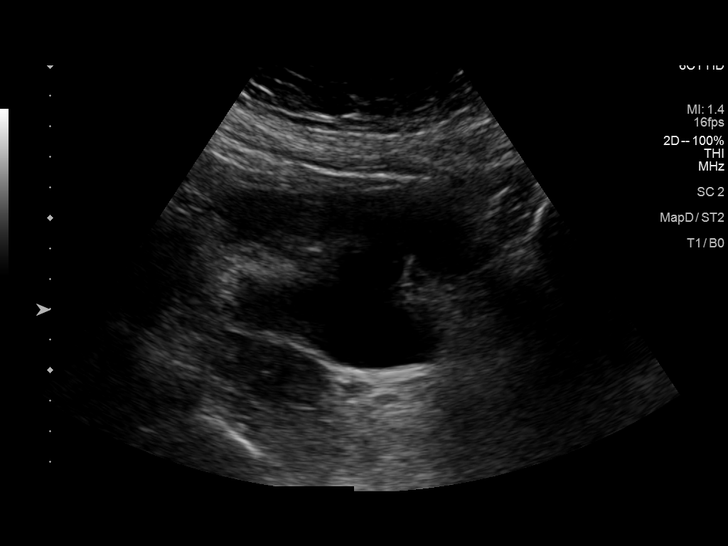

[14 of 25 positions shown; findings below may reference images not displayed]

FINDINGS: Right Kidney:

Renal measurements: 11.9 x 6.4 x 6.0 cm = volume: 220 mL.
Echogenicity and renal cortical thickness are within normal limits.
No mass, perinephric fluid, or hydronephrosis visualized. Apparent
calculus in the mid right kidney measuring 1.0 x 0.5 x 1.1 cm. No
ureterectasis.

Left Kidney:

Surgically absent.

Bladder:

Apparent diverticulum arising from the leftward aspect of the
bladder measuring 6.5 x 2.6 cm. No appreciable bladder wall
thickening.

Other:

None.
IMPRESSION: 1.  Left kidney absent.

2. Calculus mid right kidney measuring 1.0 x 0.5 x 1.1 cm. Right
kidney otherwise appears unremarkable.

3. Sizable diverticulum arising from the leftward aspect of bladder.

## 2021-07-20 ENCOUNTER — Ambulatory Visit (INDEPENDENT_AMBULATORY_CARE_PROVIDER_SITE_OTHER): Payer: Medicare Other | Admitting: Licensed Clinical Social Worker

## 2021-07-20 DIAGNOSIS — Z638 Other specified problems related to primary support group: Secondary | ICD-10-CM

## 2021-07-20 DIAGNOSIS — F4323 Adjustment disorder with mixed anxiety and depressed mood: Secondary | ICD-10-CM

## 2021-07-20 DIAGNOSIS — Z6379 Other stressful life events affecting family and household: Secondary | ICD-10-CM

## 2021-07-20 NOTE — Progress Notes (Signed)
Virtual Visit via Video Note  I connected with Brooke Duncan on 07/20/21 at  2:00 PM EDT by a video enabled telemedicine application and verified that I am speaking with the correct person using two identifiers.  Location: Patient: home Provider: home office   I discussed the limitations of evaluation and management by telemedicine and the availability of in person appointments. The patient expressed understanding and agreed to proceed.  I discussed the assessment and treatment plan with the patient. The patient was provided an opportunity to ask questions and all were answered. The patient agreed with the plan and demonstrated an understanding of the instructions.   The patient was advised to call back or seek an in-person evaluation if the symptoms worsen or if the condition fails to improve as anticipated.  I provided 39 minutes of non-face-to-face time during this encounter.  THERAPIST PROGRESS NOTE  Session Time: 2:00 PM to 2:39 PM  Participation Level: Active  Behavioral Response: CasualAlerteuthymic deals with stress frustration of being a caretaker  Type of Therapy: Individual Therapy  Treatment Goals addressed:  Therapist provide feedback and be a sounding board for patient that her strategies of coping are effective for managing the situation, utilize therapy for emotional regulation to process feelings and therapist educate patient and on other coping strategies helpful, provide supportive interventions patient has a caretaker Interventions: CBT, Solution Focused, Strength-based, Supportive, and Other: coping strategies  Summary: Brooke Duncan is a 75 y.o. female who presents with lots going on some days better than others. [place] niece her husband two kids are still with them. Will be there another month probably. Stressful having little kids around little one who is 14 months communicates by screeching. Has said to herself if he screech is 1 more time will lose her  mind. 74 year old independent and when try to help her she escalates. The good part of having there there they do some cute things. Will color with older one. It is fun in some ways. Fun to see the little one advancing with things challenging though into everything. Most of the time fine with husband, but situations where he is trying to get them for example not climb on something. They are not understanding he can't speak normally, he is getting frustrated because not doing what he wants to do. Don't have him with kids, when patient is out for a minute and he is trying to control it when an issue but doesn't happen often. Explored good to keep him engaged. They has been walking but it got too hot and humid. Humor helpful [employer] he can follow and laughs. Most of the time what keeps him busy TV not able to do things like before. Brooke Duncan has more frustration wants to do more and can't try and doesn't work. Social worker assigned hard to get time with him. Have paperwork need finished and to submit. Supposed to get OT still haven't contact have to get a hold of people providing home care. Sees his neurologist request for her prescription have the same person come back if possible for speech therapist. She was able to really connect challenge him in ways he would do it, with patient whole other situation. Checking in with mood mostly a good day together.  Discussed other activities to help him engage they went to see a movie and had lunch. When drive to market to get groceries will take him with him. Gets him out of the house. Runs an easy errand. He roams  and stares at something for fifteen minutes and patient wants to just get going. Urge to get a dog doesn't have the urge to take care of it. Do provide comfort. Family here have a dog sweet dog but a lot of care. Brooke Duncan holds on to her when they walk, can see ways would be nice as well with dog. Will see how it goes after family leave and the dog  leaves will see.    Therapist reviewed symptoms, facilitated expression of thoughts and feelings as a treatment intervention to help patient process to help work through feelings, help manage her stressors.  Continue to review website Dementia, caregiving and controlling frustration" from family caregiver alliance.  Noted that modifying her thoughts can help reduce frustration.  We have automatic habitual thoughts and we pay more attention we can modify unhelpful ones.  Is how you think affects how you feel.  Introduced 3 column thought challenge worksheet patient identifying the thought, identifying the distortion and thought (introduced cognitive distortions) and adaptive response.  Noted more she pays attention to thoughts and feelings more she would be able to regulate them.  She can intervene in different channels she can look at the situation, her thoughts how she responds also manage things through how she responds physically finding coping strategies to help de-escalate through physical strategies.  Noted patient's ability to work on modifying thoughts as good coping. Encouraged patient for husband to find ways to have him stay engaged physically, cognitively emotionally and positive strategies of going to the movies, taking him own errands and can, did using humor is helpful and utilized in session to talk about some of the situations encounters because her husband has dementia.  Noted self talk is helpful so that she finds an adaptive way to think about things such as managing frustration of taking a shopping this is time that is good for him.  Encourage patient with continuing physical activity.  Elevated patient on struggles and frustrations of being the caregiver.  Reviewed pros and cons of getting a dog as possible helpful coping.  Urged patient with continued self-care.  Suicidal/Homicidal: No  Plan: Return again in 4 weeks.  2.  Patient continue to use sessions to help process feelings and cope  with stressors, identify helpful coping strategies, continue to work on to websites for strategies for managing frustration for caregivers.  Diagnosis: Axis I: adjustment reaction with anxiety and depression, husband unwell    Axis II: No diagnosis    Cordella Register, LCSW 07/20/2021

## 2021-08-12 ENCOUNTER — Other Ambulatory Visit: Payer: Self-pay | Admitting: Obstetrics and Gynecology

## 2021-08-12 DIAGNOSIS — N6320 Unspecified lump in the left breast, unspecified quadrant: Secondary | ICD-10-CM

## 2021-08-17 ENCOUNTER — Ambulatory Visit (INDEPENDENT_AMBULATORY_CARE_PROVIDER_SITE_OTHER): Payer: Medicare Other | Admitting: Licensed Clinical Social Worker

## 2021-08-17 DIAGNOSIS — Z638 Other specified problems related to primary support group: Secondary | ICD-10-CM

## 2021-08-17 DIAGNOSIS — F4323 Adjustment disorder with mixed anxiety and depressed mood: Secondary | ICD-10-CM

## 2021-08-17 DIAGNOSIS — Z6379 Other stressful life events affecting family and household: Secondary | ICD-10-CM

## 2021-08-17 NOTE — Progress Notes (Signed)
Virtual Visit via Video Note  I connected with Brooke Duncan on 08/17/21 at  2:00 PM EDT by a video enabled telemedicine application and verified that I am speaking with the correct person using two identifiers.  Location: Patient: home Provider: office   I discussed the limitations of evaluation and management by telemedicine and the availability of in person appointments. The patient expressed understanding and agreed to proceed.  I discussed the assessment and treatment plan with the patient. The patient was provided an opportunity to ask questions and all were answered. The patient agreed with the plan and demonstrated an understanding of the instructions.   The patient was advised to call back or seek an in-person evaluation if the symptoms worsen or if the condition fails to improve as anticipated.  I provided 30 minutes of non-face-to-face time during this encounter.  THERAPIST PROGRESS NOTE  Session Time: 2:00 PM to 2:30 PM  Participation Level: Active  Behavioral Response: CasualAlertEuthymic  Type of Therapy: Individual Therapy  Treatment Goals addressed:  Therapist provide feedback and be a sounding board for patient that her strategies of coping are effective for managing the situation, utilize therapy for emotional regulation to process feelings and therapist educate patient and on other coping strategies helpful, provide supportive interventions patient has a caretaker Interventions: Solution Focused, Strength-based, Supportive, and Other: coping  Summary: Brooke Duncan is a 75 y.o. female who presents with pretty good, has had her own physical issues bad knee hoping to have it replaced giving her all kinds of problems, got a Cortizone shot and helped.  It was reviewed things in place that are helpful that she talks to sister-in-law and is able to vent therapist wanted to know how often to get a gauge of her frustration level patient said talk to each other 2-3 times  a week. Helps to talk to someone who is going through the same thing as her. She relates she feels so much better, doing better so attitude a lot better.  Therapist had talked to her last week would get a different story. With her knee problem when had to do things more difficult to do things. Walker in house to help her more mobile. Reviewed pain scale 0-10 with 10 being the worst and easily an 8. Rates it at 2 now. Will use walker allow the Cortisone shot to do what it supposed to do before putting weight on leg.  Couldn't sleep some nights. Has to be careful with ibuprofen only has 1 kidney. Out of the blue got diagnosed with kidney cancer. When did surgery couldn't save the kidney so took the whole thing. This happened in 2014. Eight years ago. Orthopedic doctor that she sees for knee. What alleviates pain is Cortizone. Has been dealing with this for a long time. Should have had surgery years ago. After kidney cancer every year seems so much happened. Moved here and getting new doctors. Has to lose weight. He referred her to weight management [place] might go through the program. Make up her mind whether she wants to commit it is an expense. Reviewed getting paperwork done she needed to went through [facility] provide a place that provides different home health services. Didn't help get paperwork done. If nothing done talk to neurologist for another referral in November. With recent issues she realizes she has try and get in better shape if she is going to be caretaker. Shared a good morning husband before not liking the dentist because of  early experiences so hasn't gone because didn't want to do and this time agreed to go. Relieved that wasn't worse. Good report. Fortunately husband has had better days not sure why. July 12 ended home physical therapy diagnosed with parkinson's now. Took awhile to get things coordinated with referral and outpatient place for him to do physical therapy.  Just got him started with that. [place] niece still there with them. After that will take a breather. Mid to end of October when they are leaving. Thinks all of them need a break.   Therapist reviewed symptoms, facilitated expression of thoughts and feelings, utilize processing feelings as treatment intervention to help patient processed through recent stressors with knee.  Therapist noted the difficulty of the challenge of being a caretaker when she was in significant pain.  Provided strength-based intervention in noting strength throughout her life of managing things that therapist sees she does with current issues although also having the support from therapy still being useful for patient somebody who follows her and cares provides feedback that may be useful place for patient to friends and also gain insight about helpful coping strategies for herself.  Noted significant insight patient had from recent episode that she is going to have to maintain her health and improve her health if she is going to be a caretaker.  Therapist noted many of the benefits that comes from taking care of health having a better quality of life and feeling better but also noting challenges that it brings on for things like losing weight.  Therapist offered patient encouragement and motivational strategies.  Therapist provided active listening, open questions, supportive interventions  Suicidal/Homicidal: No  Plan: Return again in 6 weeks.2 Patient continue to use sessions to help process feelings and cope with stressors, identify helpful coping strategies, continue to work on to websites for strategies for managing frustration for caregivers.  Diagnosis: Axis I: adjustment reaction with anxiety and depression, husband unwell    Axis II: No diagnosis    Brooke Register, LCSW 08/17/2021

## 2021-08-24 ENCOUNTER — Ambulatory Visit
Admission: RE | Admit: 2021-08-24 | Discharge: 2021-08-24 | Disposition: A | Discharge status: Home / Self Care | Payer: Medicare Other | Source: Ambulatory Visit | Attending: Obstetrics and Gynecology | Admitting: Obstetrics and Gynecology

## 2021-08-24 ENCOUNTER — Other Ambulatory Visit: Payer: Self-pay

## 2021-08-24 DIAGNOSIS — N6320 Unspecified lump in the left breast, unspecified quadrant: Secondary | ICD-10-CM

## 2021-08-24 HISTORY — PX: BREAST BIOPSY: SHX20

## 2021-08-25 ENCOUNTER — Telehealth: Payer: Self-pay | Admitting: Hematology and Oncology

## 2021-08-25 ENCOUNTER — Encounter: Payer: Self-pay | Admitting: *Deleted

## 2021-08-25 ENCOUNTER — Other Ambulatory Visit: Payer: Self-pay | Admitting: Obstetrics and Gynecology

## 2021-08-25 DIAGNOSIS — C50912 Malignant neoplasm of unspecified site of left female breast: Secondary | ICD-10-CM

## 2021-08-25 NOTE — Telephone Encounter (Signed)
Spoke to patient to confirm afternoon clinic appointment for 9/28, packet sent via email

## 2021-08-27 ENCOUNTER — Other Ambulatory Visit: Payer: Self-pay | Admitting: *Deleted

## 2021-08-27 DIAGNOSIS — Z170 Estrogen receptor positive status [ER+]: Secondary | ICD-10-CM | POA: Insufficient documentation

## 2021-08-27 DIAGNOSIS — C50412 Malignant neoplasm of upper-outer quadrant of left female breast: Secondary | ICD-10-CM | POA: Insufficient documentation

## 2021-08-30 ENCOUNTER — Other Ambulatory Visit: Payer: Self-pay

## 2021-08-30 ENCOUNTER — Ambulatory Visit
Admission: RE | Admit: 2021-08-30 | Discharge: 2021-08-30 | Disposition: A | Discharge status: Home / Self Care | Payer: Medicare Other | Source: Ambulatory Visit | Attending: Obstetrics and Gynecology | Admitting: Obstetrics and Gynecology

## 2021-08-30 DIAGNOSIS — C50912 Malignant neoplasm of unspecified site of left female breast: Secondary | ICD-10-CM

## 2021-08-30 MED ORDER — GADOBUTROL 1 MMOL/ML IV SOLN
10.0000 mL | Freq: Once | INTRAVENOUS | Status: AC | PRN
  Administered 2021-08-30: 10 mL via INTRAVENOUS

## 2021-08-31 NOTE — Progress Notes (Signed)
[facility] NOTE  Patient Care Team: Lawerance Cruel, MD as PCP - General (Family Medicine) Mauro Kaufmann, RN as Oncology Nurse Navigator Rockwell Germany, RN as Oncology Nurse Navigator Rolm Bookbinder, MD as Consulting Physician (General Surgery) Nicholas Lose, MD as Consulting Physician (Hematology and Oncology) Gery Pray, MD as Consulting Physician (Radiation Oncology)  CHIEF COMPLAINTS/PURPOSE OF CONSULTATION:  Newly diagnosed breast cancer  HISTORY OF PRESENTING ILLNESS:  Brooke Duncan 75 y.o. female is here because of recent diagnosis of invasive mammary carcinoma of the left breast. MRI Breast 08/30/2021 showed a triangular shaped area of enhancement measuring 4.4 x 2.2 x 1.8 cm, a linear area of low level non mass enhancement spanning 5.2 x 1.8 x 0.9 cm, and smaller rounded area of non mass enhancement spanning 2.3 x 2.4 x 1.6 cm in the left breast. Biopsy on 08/24/2021 showed invasive mammary carcinoma and mammary carcinoma in situ, ER+(70%)/PR+(10%)/Her2-. She presents to the clinic today for initial evaluation and discussion of treatment options.   I reviewed her records extensively and collaborated the history with the patient.  SUMMARY OF ONCOLOGIC HISTORY: Oncology History  Malignant neoplasm of upper-outer quadrant of left breast in female, estrogen receptor positive ([place])  08/24/2021 Initial Diagnosis   MRI Breast: a triangular shaped area of enhancement measuring 4.4 x 2.2 x 1.8 cm, a linear area of low level non mass enhancement spanning 5.2 x 1.8 x 0.9 cm, and smaller rounded area of non mass enhancement spanning 2.3 x 2.4 x 1.6 cm in the left breast. 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   09/01/2021 Cancer Staging   Staging form: Breast, AJCC 8th Edition - Clinical stage from 09/01/2021: Stage IIA (cT3, cN0, cM0, G2, ER+, PR+, HER2-) - Signed by Nicholas Lose,  MD on 09/01/2021 Stage prefix: Initial diagnosis Histologic grading system: 3 grade system     MEDICAL HISTORY:  Past Medical History:  Diagnosis Date   Acquired solitary kidney    right side 2014   Arthritis    Frequency of urination    Heart murmur    per told by pcp approx. 02/ 2022 heard a very faint murmur, told did need work-up done at this time   History of Hashimoto thyroiditis    s/p  total thyroidectomy 1995   History of hypercalcemia    s/p  parathryoidectomy 07/ 2021   History of renal cell carcinoma 2014   s/p  left nephrectomy, pt stated no other treatment   Hypertension    followed by pcp   Hypothyroidism, postsurgical 1995   followed by pcp in [place], moved from [place] 01/ 2021   OSA on CPAP    Thickened endometrium    Wears glasses     SURGICAL HISTORY: Past Surgical History:  Procedure Laterality Date   HYSTEROSCOPY WITH D & C N/A 04/08/2021   Procedure: DILATATION AND CURETTAGE /HYSTEROSCOPY, EXCISION OF VAGINAL LESION;  Surgeon: Delsa Bern, MD;  Location: [facility];  Service: Gynecology;  Laterality: N/A;   LAPAROSCOPIC CHOLECYSTECTOMY  2005   NEPHRECTOMY Left 2014   PARATHYROIDECTOMY  07/ 2021  in [place]  age 52   TOTAL HIP ARTHROPLASTY Bilateral left 2015;  right 2009   TOTAL KNEE ARTHROPLASTY Left 2014   TOTAL THYROIDECTOMY Bilateral 1995    SOCIAL HISTORY: Social History   Socioeconomic History   Marital status: Married    Spouse name: Not on file  Number of children: Not on file   Years of education: Not on file   Highest education level: Not on file  Occupational History   Not on file  Tobacco Use   Smoking status: Former    Years: 12.00    Types: Cigarettes    Quit date: 04/02/1974    Years since quitting: 47.4   Smokeless tobacco: Never  Vaping Use   Vaping Use: Never used  Substance and Sexual Activity   Alcohol use: Not Currently   Drug use: Never   Sexual activity: Not on file   Other Topics Concern   Not on file  Social History Narrative   Not on file   Social Determinants of Health   Financial Resource Strain: Not on file  Food Insecurity: Not on file  Transportation Needs: Not on file  Physical Activity: Not on file  Stress: Not on file  Social Connections: Not on file  Intimate Partner Violence: Not on file    FAMILY HISTORY: Family History  Problem Relation Age of Onset   Melanoma Father    Prostate cancer Father     ALLERGIES:  is allergic to tape.  MEDICATIONS:  Current Outpatient Medications  Medication Sig Dispense Refill   CRANBERRY PO Take 2 capsules by mouth daily.     D-MANNOSE PO Take 4 capsules by mouth daily.     letrozole (FEMARA) 2.5 MG tablet Take 1 tablet (2.5 mg total) by mouth daily. 90 tablet 3   levothyroxine (SYNTHROID) 150 MCG tablet Take 150 mcg by mouth daily before breakfast.     losartan (COZAAR) 25 MG tablet Take 25 mg by mouth daily.     oxyCODONE-acetaminophen (PERCOCET/ROXICET) 5-325 MG tablet Take 1 tablet by mouth every 8 (eight) hours as needed for severe pain. 6 tablet 0   acetaminophen (TYLENOL) 500 MG tablet Take 2 tablets (1,000 mg total) by mouth every 6 (six) hours as needed. 30 tablet 0   Cholecalciferol (VITAMIN D3 SUPER STRENGTH) 50 MCG (2000 UT) CAPS Take by mouth daily.     clotrimazole (LOTRIMIN) 1 % cream Apply 1 application topically 2 (two) times daily as needed.     erythromycin ophthalmic ointment 1 application as needed.     hydrocortisone 2.5 % cream Apply topically 2 (two) times daily as needed.     ibuprofen (ADVIL) 600 MG tablet Take 1 tablet (600 mg total) by mouth every 6 (six) hours as needed. 30 tablet 0   nitrofurantoin (MACRODANTIN) 50 MG capsule Take 50 mg by mouth at bedtime.     PRESCRIPTION MEDICATION 4 capsules daily. D-Mannose     simvastatin (ZOCOR) 10 MG tablet Take 10 mg by mouth at bedtime.     vitamin B-12 (CYANOCOBALAMIN) 1000 MCG tablet Take 1,000 mcg by mouth daily.      No current facility-administered medications for this visit.    REVIEW OF SYSTEMS:   Constitutional: Denies fevers, chills or abnormal night sweats Complaining of severe osteoarthritis of the right knee All other systems were reviewed with the patient and are negative.  PHYSICAL EXAMINATION: ECOG PERFORMANCE STATUS: 1 - Symptomatic but completely ambulatory  Vitals:   09/01/21 1312  BP: (!) 144/66  Pulse: 84  Temp: 97.7 F (36.5 C)  SpO2: 98%   Filed Weights   09/01/21 1312  Weight: 264 lb 1.6 oz (119.8 kg)     LABORATORY DATA:  I have reviewed the data as listed Lab Results  Component Value Date   WBC 8.1 09/01/2021  HGB 13.7 09/01/2021   HCT 43.5 09/01/2021   MCV 87.9 09/01/2021   PLT 161 09/01/2021   Lab Results  Component Value Date   NA 140 09/01/2021   K 4.4 09/01/2021   CL 105 09/01/2021   CO2 26 09/01/2021    RADIOGRAPHIC STUDIES: I have personally reviewed the radiological reports and agreed with the findings in the report.  ASSESSMENT AND PLAN:  Malignant neoplasm of upper-outer quadrant of left breast in female, estrogen receptor positive ([place]) 08/24/2021: Screening mammogram revealed left breast asymmetry at 2 o'clock position measuring 1.6 cm.  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  MRI detected 2 additional masses measuring 5.2 cm and 2.4 cm (need biopsied) no lymphadenopathy  Pathology and radiology counseling:Discussed with the patient, the details of pathology including the type of breast cancer,the clinical staging, the significance of ER, PR and HER-2/neu receptors and the implications for treatment. After reviewing the pathology in detail, we proceeded to discuss the different treatment options between surgery, radiation, chemotherapy, antiestrogen therapies.  Recommendations:  1.  Neoadjuvant antiestrogen therapy  with letrozole (until additional biopsies were performed) Breast conserving surgery versus mastectomy with sentinel lymph node biopsy (if the MRI biopsies of the additional lesions are negative then patient will be treated with neoadjuvant antiestrogen therapy and for 6 months followed by lumpectomy) 2. Oncotype DX testing to determine if chemotherapy would be of any benefit followed by 3. Adjuvant radiation therapy followed by 4. Adjuvant antiestrogen therapy  Oncotype counseling: I discussed Oncotype DX test. I explained to the patient that this is a 21 gene panel to evaluate patient tumors DNA to calculate recurrence score. This would help determine whether patient has high risk or low risk breast cancer. She understands that if her tumor was found to be high risk, she would benefit from systemic chemotherapy. If low risk, no need of chemotherapy.  Return to clinic based upon the pathology results from the additional biopsies. Or severe osteoarthritis of the right knee: I gave her 10 tablets of Percocet so that she can tolerate MRI guided biopsies. She is planning on getting a knee replacement surgery but to do that she will need to lose some weight.   All questions were answered. The patient knows to call the clinic with any problems, questions or concerns.   Rulon Eisenmenger, MD, MPH 09/01/2021    I, Thana Ates, am acting as scribe for Nicholas Lose, MD.  I have reviewed the above documentation for accuracy and completeness, and I agree with the above.

## 2021-08-31 NOTE — Progress Notes (Signed)
Radiation Oncology         [phone] ________________________________  Multidisciplinary Breast Oncology Clinic ([facility]) Initial Outpatient Consultation  Name: Brooke Duncan MRN: [id number]  Date: 09/01/2021  DOB: 08/30/1946  CC:Ross, Charles Alan, MD  Wakefield, Matthew, MD   REFERRING PHYSICIAN: Wakefield, Matthew, MD  DIAGNOSIS: The encounter diagnosis was Malignant neoplasm of upper-outer quadrant of left breast in female, estrogen receptor positive (HCC).  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 Malignant neoplasm of upper-outer quadrant of left breast in female, estrogen receptor positive (HCC)  C50.412    Z17.0       HISTORY OF PRESENT ILLNESS::Brooke Duncan is a 75 y.o. female who is presenting to the office today for evaluation of her newly diagnosed breast cancer. She is accompanied by her great niece. She is doing well overall.   She had routine screening mammography on 06/09/21 showing a possible abnormality in the left breast breast. Screening mammogram also showed a possible abnormality within the right breast as well. She underwent bilateral diagnostic mammography with tomography and bilateral breast ultrasonography at [facility] on 08/04/21 showing: multiple benign cysts in the left breast, largest of which at the 3:00 position, 4 cmfn, measuring 1.1 cm in the greatest dimension. A 2:00, 8 cmfn irregular mass in the left breast was seen also to measure 1.6 cm in the greatest dimension. Right breast ultrasound also showed a few benign cysts; largest of which is at the 3:00 position, 1 cmfn measuring 0.6 cm in the greatest dimensions.  Left breast biopsy at the 2 o'clock position, 8cmfn on 08/24/21 showed: grade 2 invasive mammary carcinoma measuring 1.0 cm in the maximum extent, and mammary carcinoma in situ. Prognostic indicators significant for:  estrogen receptor, 70% positive and progesterone receptor, 10% positive, both with strong staining intensity. Proliferation marker Ki67 at 15%. HER2 negative.  Bilateral breast MRI performed on 08/30/21 demonstrated the biopsy proven malignancy in the left breast; seen as a triangular shaped area of enhancement in the lateral left breast; posterior depth, and measuring 4.4 x 2.2 x 1.8 cm. An additional linear area of low level non mass enhancement was seen in the lower outer left breast spanning 5.2 x 1.8 x 0.9 cm; noted as suspicious for malignancy. A smaller rounded area of non mass enhancement noted in the central left breast, slightly medial to midline, spanning 2.3 x 2.4 x 1.6 cm, also suspicious for additional malignancy. No evidence of right breast malignancy was seen or metastatic lymphadenopathy.   Menarche: 75 years old LMP: in 2000 Contraceptive: 1966 through 1985 HRT: none   The patient was referred today for presentation in the multidisciplinary conference.  Radiology studies and pathology slides were presented there for review and discussion of treatment options.  A consensus was discussed regarding potential next steps.  PREVIOUS RADIATION THERAPY: No  PAST MEDICAL HISTORY:  Past Medical History:  Diagnosis Date   Acquired solitary kidney    right side 2014   Arthritis    Frequency of urination    Heart murmur    per told by pcp approx. 02/ 2022 heard a very faint murmur, told did need work-up done at this time   History of Hashimoto thyroiditis    s/p  total thyroidectomy 1995   History of hypercalcemia    s/p  parathryoidectomy 07/ 2021   History of renal cell carcinoma 2014     s/p  left nephrectomy, pt stated no other treatment   Hypertension    followed by pcp   Hypothyroidism, postsurgical 1995   followed by pcp in [place], moved from [place] 01/ 2021   OSA on CPAP    Thickened endometrium    Wears glasses     PAST SURGICAL HISTORY: Past Surgical History:   Procedure Laterality Date   HYSTEROSCOPY WITH D & C N/A 04/08/2021   Procedure: DILATATION AND CURETTAGE /HYSTEROSCOPY, EXCISION OF VAGINAL LESION;  Surgeon: Rivard, Sandra, MD;  Location: [facility];  Service: Gynecology;  Laterality: N/A;   LAPAROSCOPIC CHOLECYSTECTOMY  2005   NEPHRECTOMY Left 2014   PARATHYROIDECTOMY  07/ 2021  in [place]   TONSILLECTOMY  age 8   TOTAL HIP ARTHROPLASTY Bilateral left 2015;  right 2009   TOTAL KNEE ARTHROPLASTY Left 2014   TOTAL THYROIDECTOMY Bilateral 1995    FAMILY HISTORY:  Family History  Problem Relation Age of Onset   Melanoma Father    Prostate cancer Father     SOCIAL HISTORY:  Social History   Socioeconomic History   Marital status: Married    Spouse name: Not on file   Number of children: Not on file   Years of education: Not on file   Highest education level: Not on file  Occupational History   Not on file  Tobacco Use   Smoking status: Former    Years: 12.00    Types: Cigarettes    Quit date: 04/02/1974    Years since quitting: 47.4   Smokeless tobacco: Never  Vaping Use   Vaping Use: Never used  Substance and Sexual Activity   Alcohol use: Not Currently   Drug use: Never   Sexual activity: Not on file  Other Topics Concern   Not on file  Social History Narrative   Not on file   Social Determinants of Health   Financial Resource Strain: Not on file  Food Insecurity: Not on file  Transportation Needs: Not on file  Physical Activity: Not on file  Stress: Not on file  Social Connections: Not on file    ALLERGIES:  Allergies  Allergen Reactions   Tape Rash and Other (See Comments)    "Blistery rash"    MEDICATIONS:  Current Outpatient Medications  Medication Sig Dispense Refill   acetaminophen (TYLENOL) 500 MG tablet Take 2 tablets (1,000 mg total) by mouth every 6 (six) hours as needed. 30 tablet 0   Cholecalciferol (VITAMIN D3 SUPER STRENGTH) 50 MCG (2000 UT) CAPS Take by mouth daily.      clotrimazole (LOTRIMIN) 1 % cream Apply 1 application topically 2 (two) times daily as needed.     CRANBERRY PO Take 2 capsules by mouth daily.     D-MANNOSE PO Take 4 capsules by mouth daily.     erythromycin ophthalmic ointment 1 application as needed.     hydrocortisone 2.5 % cream Apply topically 2 (two) times daily as needed.     ibuprofen (ADVIL) 600 MG tablet Take 1 tablet (600 mg total) by mouth every 6 (six) hours as needed. 30 tablet 0   letrozole (FEMARA) 2.5 MG tablet Take 1 tablet (2.5 mg total) by mouth daily. 90 tablet 3   levothyroxine (SYNTHROID) 150 MCG tablet Take 150 mcg by mouth daily before breakfast.     losartan (COZAAR) 25 MG tablet Take 25 mg by mouth daily.     nitrofurantoin (MACRODANTIN) 50 MG capsule Take 50 mg by mouth at bedtime.       oxyCODONE-acetaminophen (PERCOCET/ROXICET) 5-325 MG tablet Take 1 tablet by mouth every 8 (eight) hours as needed for severe pain. 6 tablet 0   PRESCRIPTION MEDICATION 4 capsules daily. D-Mannose     simvastatin (ZOCOR) 10 MG tablet Take 10 mg by mouth at bedtime.     vitamin B-12 (CYANOCOBALAMIN) 1000 MCG tablet Take 1,000 mcg by mouth daily.     No current facility-administered medications for this encounter.    REVIEW OF SYSTEMS: A 10+ POINT REVIEW OF SYSTEMS WAS OBTAINED including neurology, dermatology, psychiatry, cardiac, respiratory, lymph, extremities, GI, GU, musculoskeletal, constitutional, reproductive, HEENT. On the provided form, she reports bruising easily, and difficulties walking .  She is in need of left knee reconstruction.  She denies any other symptoms.    PHYSICAL EXAM:   Vitals with BMI 09/01/2021  Height 5' 5"  Weight 264 lbs 2 oz  BMI 19.50  Systolic 932  Diastolic 66  Pulse 84   Lungs are clear to auscultation bilaterally. Heart has regular rate and rhythm. No palpable cervical, supraclavicular, or axillary adenopathy. Abdomen soft, non-tender, normal bowel sounds. Preferred no breast examination  except by surgeon  KPS = 90  100 - Normal; no complaints; no evidence of disease. 90   - Able to carry on normal activity; minor signs or symptoms of disease. 80   - Normal activity with effort; some signs or symptoms of disease. 30   - Cares for self; unable to carry on normal activity or to do active work. 60   - Requires occasional assistance, but is able to care for most of his personal needs. 50   - Requires considerable assistance and frequent medical care. 71   - Disabled; requires special care and assistance. 79   - Severely disabled; hospital admission is indicated although death not imminent. 69   - Very sick; hospital admission necessary; active supportive treatment necessary. 10   - Moribund; fatal processes progressing rapidly. 0     - Dead  Karnofsky DA, Abelmann [place], Craver LS and Burchenal JH [phone]) The use of the nitrogen mustards in the palliative treatment of carcinoma: with particular reference to bronchogenic carcinoma Cancer 1 634-56  LABORATORY DATA:  Lab Results  Component Value Date   WBC 8.1 09/01/2021   HGB 13.7 09/01/2021   HCT 43.5 09/01/2021   MCV 87.9 09/01/2021   PLT 161 09/01/2021   Lab Results  Component Value Date   NA 140 09/01/2021   K 4.4 09/01/2021   CL 105 09/01/2021   CO2 26 09/01/2021   Lab Results  Component Value Date   ALT 17 09/01/2021   AST 12 (L) 09/01/2021   ALKPHOS 79 09/01/2021   BILITOT 0.7 09/01/2021    PULMONARY FUNCTION TEST:   Recent Review Flowsheet Data   There is no flowsheet data to display.     RADIOGRAPHY: MR BREAST BILATERAL W WO CONTRAST INC CAD  Result Date: 08/30/2021 CLINICAL DATA:  Evaluate for extent of disease. Recent diagnosis of left breast carcinoma. LABS:  No labs drawn at time of imaging. EXAM: BILATERAL BREAST MRI WITH AND WITHOUT CONTRAST TECHNIQUE: Multiplanar, multisequence MR images of both breasts were obtained prior to and following the intravenous administration of 10 ml of Gadavist  Three-dimensional MR images were rendered by post-processing of the original MR data on an independent workstation. The three-dimensional MR images were interpreted, and findings are reported in the following complete MRI report for this study. Three dimensional images were evaluated at the independent interpreting  workstation using the DynaCAD thin client. COMPARISON:  Previous exam(s). FINDINGS: Breast composition: b. Scattered fibroglandular tissue. Background parenchymal enhancement: Moderate. Right breast: No mass or abnormal enhancement. Left breast: A triangular shaped area of abnormal enhancement reflecting the irregular mass/biopsy-proven carcinoma and contiguous non mass enhancement. Biopsy clip artifact lies within the center of this. The abnormal enhancement is centered on image 79, series 9, spanning 4.4 x 2.2 cm transversely by 1.8 cm superior to inferior. Inferior to the biopsied carcinoma, there is linear, low level non mass enhancement, centered on image 115, series 9, measuring 5.2 x 1.8 cm transversely by 0.9 cm superior to inferior. More centrally, slightly medial to midline, centered on image 72, series 9, there is a rounded area of non mass enhancement that spans 2.3 x 2.2 cm transversely by 1.6 cm superior to inferior. No other areas of abnormal left breast enhancement. Lymph nodes: No abnormal appearing lymph nodes. Ancillary findings:  None. IMPRESSION: 1. Biopsy-proven malignancy in the left breast is visualized as a triangular shaped area of enhancement in the lateral left breast, posterior depth, measuring 4.4 x 2.2 x 1.8 cm. 2. Additional, linear area of low level non mass enhancement is seen in the lower outer left breast spanning 5.2 x 1.8 x 0.9 cm, suspicious for additional malignancy. 3. Smaller rounded area of non mass enhancement noted in the central left breast, slightly medial to midline, spanning 2.3 x 2.4 x 1.6 cm, also suspicious for additional malignancy. 4. No evidence of  right breast malignancy. 5. No evidence of metastatic lymphadenopathy. RECOMMENDATION: 1. Recommend MRI guided core needle biopsy of the additional area of linear non mass enhancement in the lower outer left breast, centered on image 115, series 9. 2. Recommend MRI guided core needle biopsy of the smaller area non mass enhancement in the central, slightly medial aspect of the left breast, centered on image 72, series 9. BI-RADS CATEGORY  4: Suspicious. Electronically Signed   By: Lajean Manes M.D.   On: 08/30/2021 15:13   MM CLIP PLACEMENT LEFT  Result Date: 08/24/2021 CLINICAL DATA:  Evaluate post biopsy marker clip placement following ultrasound-guided core needle biopsy of a left breast mass. EXAM: 3D DIAGNOSTIC LEFT MAMMOGRAM POST ULTRASOUND BIOPSY COMPARISON:  Previous exam(s). FINDINGS: 3D Mammographic images were obtained following ultrasound guided biopsy of a left breast mass. The biopsy marking clip is in expected position at the site of biopsy. IMPRESSION: Appropriate positioning of the coil shaped biopsy marking clip at the site of biopsy in the within the mass in the posterior, upper outer left breast. Final Assessment: Post Procedure Mammograms for Marker Placement Electronically Signed   By: Lajean Manes M.D.   On: 08/24/2021 10:57  [place] LT BREAST BX W LOC DEV 1ST LESION IMG BX SPEC US GUIDE  Addendum Date: 08/25/2021   ADDENDUM REPORT: 08/25/2021 12:24 ADDENDUM: Pathology revealed INVASIVE MAMMARY CARCINOMA, MAMMARY CARCINOMA IN SITU of the LEFT breast, 2:00 o'clock, 8cmfn, coil clip. This was found to be concordant by Dr. Lajean Manes. Pathology results were discussed with the patient by telephone. The patient reported doing well after the biopsy with tenderness at the site. Post biopsy instructions and care were reviewed and questions were answered. The patient was encouraged to call [facility] for any additional concerns. Recommend breast MRI for staging. The  patient was referred to [facility] on September 01, 2021. Pathology results reported by Stacie Acres RN on  08/25/2021. Electronically Signed   By: David  Ormond M.D.   On: 08/25/2021 12:24   Result Date: 08/25/2021 CLINICAL DATA:  Patient presents for ultrasound-guided core needle biopsy of an irregular 1.6 cm mass in the left breast, upper outer quadrant. EXAM: ULTRASOUND GUIDED LEFT BREAST CORE NEEDLE BIOPSY COMPARISON:  Previous exam(s). PROCEDURE: I met with the patient and we discussed the procedure of ultrasound-guided biopsy, including benefits and alternatives. We discussed the high likelihood of a successful procedure. We discussed the risks of the procedure, including infection, bleeding, tissue injury, clip migration, and inadequate sampling. Informed written consent was given. The usual time-out protocol was performed immediately prior to the procedure. Lesion quadrant: Upper outer quadrant Using sterile technique and 1% Lidocaine as local anesthetic, under direct ultrasound visualization, a 12 gauge spring-loaded device was used to perform biopsy of the irregular hypoechoic 1.6 cm mass at 2 o'clock, 8 cm the nipple, using an inferior approach. At the conclusion of the procedure a coil shaped tissue marker clip was deployed into the biopsy cavity. Follow up 2 view mammogram was performed and dictated separately. IMPRESSION: Ultrasound guided biopsy of a left breast mass. No apparent complications. Electronically Signed: By: David  Ormond M.D. On: 08/24/2021 10:50     IMPRESSION: Stage IIA (cT3, cN0, cM0) Left Breast UOQ, Invasive Lobular Carcinoma with Mammary Carcinoma in-situ, ER+ / PR+ / Her2-, Grade 2   If the patient is interested in breast conserving surgery, she will need biopsies of the 3 areas found on MRI.   We discussed radiation therapy principles in general terms with the expected course of treatment side  effects and potential long-term toxicities as well as the expected benefits  PLAN:  Surgery to be determined, oncotype on the final specimen Aromatase Inhibitor   Radiation therapy recommendations to be determined at a later date ------------------------------------------------  James D. Kinard, PhD, MD  This document serves as a record of services personally performed by James Kinard, MD. It was created on his behalf by Elisa Frazier, a trained medical scribe. The creation of this record is based on the scribe's personal observations and the provider's statements to them. This document has been checked and approved by the attending provider.  

## 2021-09-01 ENCOUNTER — Inpatient Hospital Stay (HOSPITAL_BASED_OUTPATIENT_CLINIC_OR_DEPARTMENT_OTHER): Payer: Medicare Other | Admitting: Hematology and Oncology

## 2021-09-01 ENCOUNTER — Ambulatory Visit: Payer: Medicare Other | Attending: General Surgery | Admitting: Physical Therapy

## 2021-09-01 ENCOUNTER — Telehealth: Payer: Self-pay | Admitting: *Deleted

## 2021-09-01 ENCOUNTER — Ambulatory Visit
Admission: RE | Admit: 2021-09-01 | Discharge: 2021-09-01 | Disposition: A | Discharge status: Home / Self Care | Payer: Medicare Other | Source: Ambulatory Visit | Attending: Radiation Oncology | Admitting: Radiation Oncology

## 2021-09-01 ENCOUNTER — Encounter: Payer: Self-pay | Admitting: *Deleted

## 2021-09-01 ENCOUNTER — Encounter: Payer: Self-pay | Admitting: Physical Therapy

## 2021-09-01 ENCOUNTER — Other Ambulatory Visit: Payer: Self-pay

## 2021-09-01 ENCOUNTER — Other Ambulatory Visit: Payer: Self-pay | Admitting: *Deleted

## 2021-09-01 ENCOUNTER — Encounter: Payer: Self-pay | Admitting: General Practice

## 2021-09-01 ENCOUNTER — Inpatient Hospital Stay: Payer: Medicare Other

## 2021-09-01 DIAGNOSIS — Z87891 Personal history of nicotine dependence: Secondary | ICD-10-CM | POA: Insufficient documentation

## 2021-09-01 DIAGNOSIS — Z808 Family history of malignant neoplasm of other organs or systems: Secondary | ICD-10-CM | POA: Diagnosis not present

## 2021-09-01 DIAGNOSIS — M25512 Pain in left shoulder: Secondary | ICD-10-CM | POA: Diagnosis present

## 2021-09-01 DIAGNOSIS — C50412 Malignant neoplasm of upper-outer quadrant of left female breast: Secondary | ICD-10-CM

## 2021-09-01 DIAGNOSIS — Z170 Estrogen receptor positive status [ER+]: Secondary | ICD-10-CM

## 2021-09-01 DIAGNOSIS — R293 Abnormal posture: Secondary | ICD-10-CM | POA: Diagnosis present

## 2021-09-01 DIAGNOSIS — Z8042 Family history of malignant neoplasm of prostate: Secondary | ICD-10-CM | POA: Insufficient documentation

## 2021-09-01 DIAGNOSIS — G8929 Other chronic pain: Secondary | ICD-10-CM | POA: Insufficient documentation

## 2021-09-01 DIAGNOSIS — M25612 Stiffness of left shoulder, not elsewhere classified: Secondary | ICD-10-CM | POA: Insufficient documentation

## 2021-09-01 LAB — CBC WITH DIFFERENTIAL (CANCER CENTER ONLY)
Abs Immature Granulocytes: 0.05 10*3/uL (ref 0.00–0.07)
Basophils Absolute: 0 10*3/uL (ref 0.0–0.1)
Basophils Relative: 0 %
Eosinophils Absolute: 0.2 10*3/uL (ref 0.0–0.5)
Eosinophils Relative: 2 %
HCT: 43.5 % (ref 36.0–46.0)
Hemoglobin: 13.7 g/dL (ref 12.0–15.0)
Immature Granulocytes: 1 %
Lymphocytes Relative: 19 %
Lymphs Abs: 1.5 10*3/uL (ref 0.7–4.0)
MCH: 27.7 pg (ref 26.0–34.0)
MCHC: 31.5 g/dL (ref 30.0–36.0)
MCV: 87.9 fL (ref 80.0–100.0)
Monocytes Absolute: 0.4 10*3/uL (ref 0.1–1.0)
Monocytes Relative: 5 %
Neutro Abs: 6 10*3/uL (ref 1.7–7.7)
Neutrophils Relative %: 73 %
Platelet Count: 161 10*3/uL (ref 150–400)
RBC: 4.95 MIL/uL (ref 3.87–5.11)
RDW: 16 % — ABNORMAL HIGH (ref 11.5–15.5)
WBC Count: 8.1 10*3/uL (ref 4.0–10.5)
nRBC: 0 % (ref 0.0–0.2)

## 2021-09-01 LAB — CMP (CANCER CENTER ONLY)
ALT: 17 U/L (ref 0–44)
AST: 12 U/L — ABNORMAL LOW (ref 15–41)
Albumin: 3.7 g/dL (ref 3.5–5.0)
Alkaline Phosphatase: 79 U/L (ref 38–126)
Anion gap: 9 (ref 5–15)
BUN: 13 mg/dL (ref 8–23)
CO2: 26 mmol/L (ref 22–32)
Calcium: 9.4 mg/dL (ref 8.9–10.3)
Chloride: 105 mmol/L (ref 98–111)
Creatinine: 0.76 mg/dL (ref 0.44–1.00)
GFR, Estimated: >60 mL/min (ref >60)
Glucose, Bld: 98 mg/dL (ref 70–99)
Potassium: 4.4 mmol/L (ref 3.5–5.1)
Sodium: 140 mmol/L (ref 135–145)
Total Bilirubin: 0.7 mg/dL (ref 0.3–1.2)
Total Protein: 6.9 g/dL (ref 6.5–8.1)

## 2021-09-01 LAB — GENETIC SCREENING ORDER

## 2021-09-01 MED ORDER — LETROZOLE 2.5 MG PO TABS: 2.5000 mg | ORAL_TABLET | Freq: Every day | ORAL | 3 refills | Status: DC

## 2021-09-01 MED ORDER — OXYCODONE-ACETAMINOPHEN 5-325 MG PO TABS: 1.0000 | ORAL_TABLET | Freq: Three times a day (TID) | ORAL | 0 refills | Status: DC | PRN

## 2021-09-01 NOTE — Assessment & Plan Note (Signed)
08/24/2021: Screening mammogram revealed left breast asymmetry at 2 o'clock position measuring 1.6 cm.  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  MRI detected 2 additional masses measuring 5.2 cm and 2.4 cm (need biopsied) no lymphadenopathy  Pathology and radiology counseling:Discussed with the patient, the details of pathology including the type of breast cancer,the clinical staging, the significance of ER, PR and HER-2/neu receptors and the implications for treatment. After reviewing the pathology in detail, we proceeded to discuss the different treatment options between surgery, radiation, chemotherapy, antiestrogen therapies.  Recommendations:  1. Breast conserving surgery versus mastectomy with sentinel lymph node biopsy followed by 2. Oncotype DX testing to determine if chemotherapy would be of any benefit followed by 3. Adjuvant radiation therapy followed by 4. Adjuvant antiestrogen therapy  Oncotype counseling: I discussed Oncotype DX test. I explained to the patient that this is a 21 gene panel to evaluate patient tumors DNA to calculate recurrence score. This would help determine whether patient has high risk or low risk breast cancer. She understands that if her tumor was found to be high risk, she would benefit from systemic chemotherapy. If low risk, no need of chemotherapy.  Return to clinic after surgery to discuss final pathology report and then determine if Oncotype DX testing will need to be sent.

## 2021-09-01 NOTE — Progress Notes (Signed)
[facility] Psychosocial Distress Screening Spiritual Care  Met with Brooke Duncan and her grandniece Brooke Duncan in Breast Multidisciplinary Clinic to introduce Support Center team/resources, reviewing distress screen per protocol.  The patient scored a 8 on the Psychosocial Distress Thermometer which indicates severe distress. Also assessed for distress and other psychosocial needs.   ONCBCN DISTRESS SCREENING 09/01/2021  Screening Type Initial Screening  Distress experienced in past week (1-10) 8  Emotional problem type Nervousness/Anxiety;Adjusting to illness  Spiritual/Religous concerns type Facing my mortality  Information Concerns Type Lack of info about diagnosis;Lack of info about treatment;Lack of info about complementary therapy choices;Lack of info about maintaining fitness  Physical Problem type Sleep/insomnia;Getting around;Swollen arms/legs  Referral to support programs Yes   Brooke Duncan is the primary caregiver for her husband, who has dementia; they just moved to this area a year ago to be closer to family for support. Provided empathic listening, normalization of feelings, and emotional support. Brooke Duncan is interested in support programming, as well.   Follow up needed: Yes.   Pl[facility] referral for after Brooke Duncan has her additional MRIs and learns of treatment plan. Also plan to phone in ca one week for pastoral check-in.   Chaplain Lisa Lundeen, MDiv, [facility] Pager [phone] Voicemail [phone]       

## 2021-09-01 NOTE — Telephone Encounter (Signed)
Received order for oncotype testing on core bx. Requisition faxed to [facility] and [facility]

## 2021-09-01 NOTE — Patient Instructions (Signed)

## 2021-09-01 NOTE — Therapy (Addendum)
[place], [place], [address] Phone: [phone]   Fax:  [phone]  Physical Therapy Evaluation  Patient Details  Name: Brooke Duncan MRN: [id number] Date of Birth: 1946/02/19 Referring Provider (PT): Dr. Rolm Bookbinder   Encounter Date: 09/01/2021   PT End of Session - 09/01/21 1947     Visit Number 1    Number of Visits 9    Date for PT Re-Evaluation 09/29/21   Then pt will see PT 3 weeks post op (around 02/2022) for reassessment   PT Start Time 1316    PT Stop Time 1335   Also saw pt from 1414-1450 for a total of 55 min   PT Time Calculation (min) 19 min    Activity Tolerance Patient tolerated treatment well    Behavior During Therapy [facility] for tasks assessed/performed             Past Medical History:  Diagnosis Date   Acquired solitary kidney    right side 2014   Arthritis    Frequency of urination    Heart murmur    per told by pcp approx. 02/ 2022 heard a very faint murmur, told did need work-up done at this time   History of Hashimoto thyroiditis    s/p  total thyroidectomy 1995   History of hypercalcemia    s/p  parathryoidectomy 07/ 2021   History of renal cell carcinoma 2014   s/p  left nephrectomy, pt stated no other treatment   Hypertension    followed by pcp   Hypothyroidism, postsurgical 1995   followed by pcp in [place], moved from [place] 01/ 2021   OSA on CPAP    Thickened endometrium    Wears glasses     Past Surgical History:  Procedure Laterality Date   HYSTEROSCOPY WITH D & C N/A 04/08/2021   Procedure: DILATATION AND CURETTAGE /HYSTEROSCOPY, EXCISION OF VAGINAL LESION;  Surgeon: Delsa Bern, MD;  Location: [place];  Service: Gynecology;  Laterality: N/A;   LAPAROSCOPIC CHOLECYSTECTOMY  2005   NEPHRECTOMY Left 2014   PARATHYROIDECTOMY  07/ 2021  in [place]  age 75   TOTAL HIP ARTHROPLASTY Bilateral left 2015;  right  2009   TOTAL KNEE ARTHROPLASTY Left 2014   TOTAL THYROIDECTOMY Bilateral 1995    There were no vitals filed for this visit.    Subjective Assessment - 09/01/21 1516     Subjective Patient reports she is here today to be seen by her medical team for her newly diagnosed left breast cancer. She also reports left shoulder pain which hs worsened over the past few months and limits her ability to reach overhead.    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 There are multiple areas of concern in the upper outer quadrant measuring 1.6 - 5.2 cm. The area biopsied is ER/PR positive and HER2 negative with a Ki67 of 15%. She is scheduled for additional biopsies. She has a history of renal cell carcinoma treated in 2014. She has had bilateral hip replacements and a left knee replacement; needs a right knee replacement when she is able to have it done. She is unsure of the dates of her joint replacements.    Patient Stated Goals Get arm moving better to tolerate surgery; prevent lymphedema and learn post op HEP    Currently in Pain? Yes  Pain Score 5     Pain Location Knee    Pain Orientation Right    Pain Descriptors / Indicators Aching    Pain Type Chronic pain    Pain Onset More than a month ago    Pain Frequency Intermittent    Aggravating Factors  walking    Pain Relieving Factors resting    Multiple Pain Sites Yes    Pain Score 5    Pain Location Shoulder    Pain Orientation Left    Pain Descriptors / Indicators Aching    Pain Type Chronic pain    Pain Onset More than a month ago    Pain Frequency Intermittent    Aggravating Factors  Reaching    Pain Relieving Factors Resting                [facility] PT Assessment - 09/01/21 0001       Assessment   Medical Diagnosis Left breast cancer; left shoulder pain    Referring Provider (PT) Dr. Rolm Bookbinder    Onset  Date/Surgical Date 06/09/21    Hand Dominance Right    Prior Therapy none      Precautions   Precautions Other (comment)    Precaution Comments active cancer      Restrictions   Weight Bearing Restrictions No      Balance Screen   Has the patient fallen in the past 6 months No    Has the patient had a decrease in activity level because of a fear of falling?  No    Is the patient reluctant to leave their home because of a fear of falling?  No      Home Environment   Living Environment Private residence    Living Arrangements Spouse/significant other   Husband has dementia and she is his caregiver   Available Help at Discharge Family   Niece and her family currently living with her while their house is being renovated     Prior Function   Level of Independence Independent with community mobility with device   Rolling walker off and on x15 years   Vocation Retired    Leisure She does not exercise but is willing to join the [employer] and do water exercise      Cognition   Overall Cognitive Status Within Functional Limits for tasks assessed      Posture/Postural Control   Posture/Postural Control Postural limitations    Postural Limitations Rounded Shoulders;Forward head      ROM / Strength   AROM / PROM / Strength AROM;Strength      AROM   Overall AROM Comments Left cervical rotation and left cervical sidebending limited 25%; other cervical AROM is WNL    AROM Assessment Site Shoulder    Right/Left Shoulder Right;Left    Right Shoulder Extension 42 Degrees    Right Shoulder Flexion 119 Degrees    Right Shoulder ABduction 118 Degrees    Right Shoulder Internal Rotation 42 Degrees    Right Shoulder External Rotation 69 Degrees    Left Shoulder Extension 25 Degrees    Left Shoulder Flexion 92 Degrees    Left Shoulder ABduction 91 Degrees   Painful     Strength   Overall Strength Unable to assess;Due to pain      Palpation   Palpation comment Tender to palpation left scapula  and left deltoid tuberosity               LYMPHEDEMA/ONCOLOGY QUESTIONNAIRE -  09/01/21 0001       Type   Cancer Type Left breast cancer      Lymphedema Assessments   Lymphedema Assessments Upper extremities      Right Upper Extremity Lymphedema   10 cm Proximal to Olecranon Process 36 cm    Olecranon Process 27.3 cm    10 cm Proximal to Ulnar Styloid Process 25.2 cm    Just Proximal to Ulnar Styloid Process 16.7 cm    Across Hand at [employer] 19.3 cm    At [place] of 2nd Digit 6.5 cm      Left Upper Extremity Lymphedema   10 cm Proximal to Olecranon Process 36.9 cm    Olecranon Process 28.9 cm    10 cm Proximal to Ulnar Styloid Process 23.9 cm    Just Proximal to Ulnar Styloid Process 16 cm    Across Hand at [employer] 19 cm    At [place] of 2nd Digit 6.4 cm             L-DEX FLOWSHEETS - 09/01/21 1900       L-DEX LYMPHEDEMA SCREENING   Measurement Type Unilateral    L-DEX MEASUREMENT EXTREMITY Upper Extremity    POSITION  Standing    DOMINANT SIDE Right    At Risk Side Left    BASELINE SCORE (UNILATERAL) 1.6             The patient was assessed using the L-Dex machine today to produce a lymphedema index baseline score. The patient will be reassessed on a regular basis (typically every 3 months) to obtain new L-Dex scores. If the score is > 6.5 points away from his/her baseline score indicating onset of subclinical lymphedema, it will be recommended to wear a compression garment for 4 weeks, 12 hours per day and then be reassessed. If the score continues to be > 6.5 points from baseline at reassessment, we will initiate lymphedema treatment. Assessing in this manner has a 95% rate of preventing clinically significant lymphedema.      Katina Dung - 09/01/21 0001     Open a tight or new jar Severe difficulty    Do heavy household chores (wash walls, wash floors) Severe difficulty    Carry a shopping bag or briefcase Moderate difficulty    Wash your  back Moderate difficulty    Use a knife to cut food Mild difficulty    Recreational activities in which you take some force or impact through your arm, shoulder, or hand (golf, hammering, tennis) Unable    During the past week, to what extent has your arm, shoulder or hand problem interfered with your normal social activities with family, friends, neighbors, or groups? Quite a bit    During the past week, to what extent has your arm, shoulder or hand problem limited your work or other regular daily activities Moderer[place]    Arm, shoulder, or hand pain. Severe    Tingling (pins and needles) in your arm, shoulder, or hand Moderate    Difficulty Sleeping Moderate difficulty    DASH Score 61.36 %              Objective measurements completed on examination: See above findings.        Patient was instructed today in a home exercise program today for post op shoulder range of motion. These included active assist shoulder flexion in sitting, scapular retraction, wall walking with shoulder abduction, and hands behind head external rotation.  She  was encouraged to do these twice a day, holding 3 seconds and repeating 5 times when permitted by her physician.          PT Education - 09/01/21 1946     Education Details Lymphedema education and post op HEP    Person(s) Educated Patient;Other (comment)   niece   Methods Explanation;Handout    Comprehension Verbalized understanding;Returned demonstration              PT Short Term Goals - 09/01/21 1958       PT SHORT TERM GOAL #1   Title Patient will be independent in her home exercise program for shoulder ROM.    Time 4    Period Weeks    Status New    Target Date 09/29/21      PT SHORT TERM GOAL #2   Title Patient will increase left shoulder flexion to >/= 120 degrees for more functional reach.    Baseline 92 degrees    Time 4    Period Weeks    Status New    Target Date 09/29/21      PT SHORT TERM GOAL #3   Title  Patient will increase left shoulder abduction to >/= 120 degrees to more easily obtain surgical position and reach with greater ease.    Baseline 91 degrees    Time 4    Period Weeks    Status New    Target Date 09/29/21      PT SHORT TERM GOAL #4   Title Patient will improve her DASH score to be </= 40 for improved overall UE function.    Baseline 61.36    Time 4    Period Weeks    Status New    Target Date 09/29/21      PT SHORT TERM GOAL #5   Title Patient will report >/= 25% decrease in left shoulder pain with daily tasks.    Time 4    Period Weeks    Status New    Target Date 09/29/21               PT Long Term Goals - 09/01/21 2001       PT LONG TERM GOAL #1   Title Patient will demonstrate she has regained left shoulder function post operatively when compared to baselines.    Time 8    Period Weeks    Status New    Target Date 10/27/21             Breast Clinic Goals - 09/01/21 1958       Patient will be able to verbalize understanding of pertinent lymphedema risk reduction practices relevant to her diagnosis specifically related to skin care.   Time 1    Period Days    Status Achieved      Patient will be able to return demonstrate and/or verbalize understanding of the post-op home exercise program related to regaining shoulder range of motion.   Time 1    Period Days    Status Achieved      Patient will be able to verbalize understanding of the importance of attending the postoperative After Breast Cancer Class for further lymphedema risk reduction education and therapeutic exercise.   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 There are multiple areas of concern in the upper outer quadrant measuring 1.6 - 5.2 cm. The area biopsied is ER/PR positive and HER2 negative  with a Ki67 of 15%. She is scheduled for additional biopsies. She has a history of renal cell carcinoma treated in 2014. She has had bilateral hip replacements and a left knee replacement; needs a right knee replacement when she is able to have it done. She is unsure of the dates of her joint replacements but none recent. Her multidisciplinary medical team met prior other assessments to determine a recommended treatment plan. She is planning to have neoadjuvant hormone therapy followed by a left lumpectomy or mastectomy depending on additional biopsy findings with a left sentinel node biopsy and then Oncotype testing. 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 She will have difficulty getting positioned properly for surgery without pain with her currently left shoulder dysfunction. She will benefit from a post op PT reassessment to determine needs and from L-Dex screens every 3 months for 2 years to detect subclinical lymphedema.    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    Examination-Activity Limitations Caring for Others;Stand;Reach Overhead;Locomotion Level    Examination-Participation Restrictions Cleaning;Shop    Stability/Clinical Decision Making Evolving/Moderate complexity    Clinical Decision Making Moderate    Rehab Potential Good    PT Frequency 2x / week    PT Duration 4 weeks   Followed by 1 post op reassessment   PT Treatment/Interventions ADLs/Self Care Home Management;Therapeutic exercise;Therapeutic activities;Patient/family education;Manual techniques;Passive range of motion    PT Next Visit Plan Begin PROM left shoulder and AAROM exercises    PT Home Exercise Plan Post op HEP    Consulted and Agree with Plan of Care Patient;Family member/caregiver    Family  Member Consulted niece             Patient will benefit from skilled therapeutic intervention in order to improve the following deficits and impairments:  Decreased knowledge of precautions, Impaired UE functional use, Pain, Postural dysfunction, Decreased range of motion  Visit Diagnosis: Malignant neoplasm of upper-outer quadrant of left breast in female, estrogen receptor positive ([place]) - Plan: PT plan of care cert/re-cert  Abnormal posture - Plan: PT plan of care cert/re-cert  Chronic left shoulder pain - Plan: PT plan of care cert/re-cert  Stiffness of left shoulder, not elsewhere classified - Plan: PT plan of care cert/re-cert  Patient will follow up at outpatient cancer rehab 3-4 weeks following surgery.  If the patient requires physical therapy at that time, a specific plan will be dictated and sent to the referring physician for approval. The patient was educated today on appropriate basic range of motion exercises to begin post operatively and the importance of attending the After Breast Cancer class following surgery.  Patient was educated today on lymphedema risk reduction practices as it pertains to recommendations that will benefit the patient immediately following surgery.  She verbalized good understanding.     Problem List Patient Active Problem List   Diagnosis Date Noted   Malignant neoplasm of upper-outer quadrant of left breast in female, estrogen receptor positive ([place]) 08/27/2021   Abnormal ultrasound of endometrium 04/08/2021   Annia Friendly, PT 09/01/21 8:12  PM   [place], [place], [address] Phone: [phone]   Fax:  [phone]  Name: Brooke Duncan MRN: [id number] Date of Birth: 1946-11-25

## 2021-09-02 ENCOUNTER — Other Ambulatory Visit: Payer: Self-pay | Admitting: General Surgery

## 2021-09-02 DIAGNOSIS — C50412 Malignant neoplasm of upper-outer quadrant of left female breast: Secondary | ICD-10-CM

## 2021-09-02 DIAGNOSIS — Z170 Estrogen receptor positive status [ER+]: Secondary | ICD-10-CM

## 2021-09-03 ENCOUNTER — Telehealth: Payer: Self-pay | Admitting: *Deleted

## 2021-09-03 ENCOUNTER — Ambulatory Visit (INDEPENDENT_AMBULATORY_CARE_PROVIDER_SITE_OTHER): Payer: Medicare Other | Admitting: Physical Therapy

## 2021-09-03 ENCOUNTER — Encounter: Payer: Self-pay | Admitting: *Deleted

## 2021-09-03 ENCOUNTER — Other Ambulatory Visit: Payer: Self-pay

## 2021-09-03 DIAGNOSIS — M25612 Stiffness of left shoulder, not elsewhere classified: Secondary | ICD-10-CM

## 2021-09-03 DIAGNOSIS — M25512 Pain in left shoulder: Secondary | ICD-10-CM | POA: Diagnosis not present

## 2021-09-03 DIAGNOSIS — R293 Abnormal posture: Secondary | ICD-10-CM

## 2021-09-03 DIAGNOSIS — C50412 Malignant neoplasm of upper-outer quadrant of left female breast: Secondary | ICD-10-CM

## 2021-09-03 DIAGNOSIS — G8929 Other chronic pain: Secondary | ICD-10-CM | POA: Diagnosis not present

## 2021-09-03 NOTE — Therapy (Signed)
[facility] [address] [place], [place], [address] Phone: [phone]   Fax:  [phone]  Physical Therapy Treatment  Patient Details  Name: Brooke Duncan MRN: [id number] Date of Birth: 02-01-1946 Referring Provider (PT): Dr. Emelia Loron   Encounter Date: 09/03/2021   PT End of Session - 09/03/21 1029     Visit Number 2    Number of Visits 9    Date for PT Re-Evaluation 09/29/21   Then pt will see PT 3 weeks post op (around 02/2022) for reassessment   PT Start Time 1017    PT Stop Time 1100    PT Time Calculation (min) 43 min    Activity Tolerance Patient tolerated treatment well    Behavior During Therapy [facility] for tasks assessed/performed             Past Medical History:  Diagnosis Date   Acquired solitary kidney    right side 2014   Arthritis    Frequency of urination    Heart murmur    per told by pcp approx. 02/ 2022 heard a very faint murmur, told did need work-up done at this time   History of Hashimoto thyroiditis    s/p  total thyroidectomy 1995   History of hypercalcemia    s/p  parathryoidectomy 07/ 2021   History of renal cell carcinoma 2014   s/p  left nephrectomy, pt stated no other treatment   Hypertension    followed by pcp   Hypothyroidism, postsurgical 1995   followed by pcp in [place], moved from [place] 01/ 2021   OSA on CPAP    Thickened endometrium    Wears glasses     Past Surgical History:  Procedure Laterality Date   HYSTEROSCOPY WITH D & C N/A 04/08/2021   Procedure: DILATATION AND CURETTAGE /HYSTEROSCOPY, EXCISION OF VAGINAL LESION;  Surgeon: Silverio Lay, MD;  Location: [facility];  Service: Gynecology;  Laterality: N/A;   LAPAROSCOPIC CHOLECYSTECTOMY  2005   NEPHRECTOMY Left 2014   PARATHYROIDECTOMY  07/ 2021  in [place]   TONSILLECTOMY  age 22   TOTAL HIP ARTHROPLASTY Bilateral left 2015;  right 2009   TOTAL KNEE ARTHROPLASTY Left 2014    TOTAL THYROIDECTOMY Bilateral 1995    There were no vitals filed for this visit.   Subjective Assessment - 09/03/21 1054     Subjective Pt reports she hurt her Rt (post) leg getting out of the MRI machine on Monday; walking is painful now.  Her Lt shoulder continues to have pain with movement over shoulder height.    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 There are multiple areas of concern in the upper outer quadrant measuring 1.6 - 5.2 cm. The area biopsied is ER/PR positive and HER2 negative with a Ki67 of 15%. She is scheduled for additional biopsies. She has a history of renal cell carcinoma treated in 2014. She has had bilateral hip replacements and a left knee replacement; needs a right knee replacement when she is able to have it done. She is unsure of the dates of her joint replacements.    Patient Stated Goals Get arm moving better to tolerate surgery; prevent lymphedema and learn post op HEP    Currently in Pain? Yes    Pain Score 5     Pain Location Leg    Pain Orientation Right;Posterior  Pain Descriptors / Indicators Aching;Sore    Pain Onset More than a month ago    Aggravating Factors  walking    Pain Relieving Factors resting    Pain Score 5   0/10 at rest   Pain Location Shoulder    Pain Orientation Left    Pain Descriptors / Indicators Aching;Sharp    Pain Onset More than a month ago    Aggravating Factors  reaching    Pain Relieving Factors resting                [facility] PT Assessment - 09/03/21 0001       Assessment   Medical Diagnosis Left breast cancer; left shoulder pain    Referring Provider (PT) Dr. Rolm Bookbinder    Onset Date/Surgical Date 06/09/21    Hand Dominance Right    Prior Therapy none      ROM / Strength   AROM / PROM / Strength PROM      PROM   PROM Assessment Site Shoulder    Right/Left Shoulder Left;Right     Right Shoulder Flexion 152 Degrees   supine   Left Shoulder Flexion 130 Degrees   AAROM supine with cane             [facility] Adult PT Treatment/Exercise - 09/03/21 0001       Exercises   Exercises Shoulder      Knee/Hip Exercises: Stretches   Passive Hamstring Stretch Right;2 reps;20 seconds   seated with hip hinge     Shoulder Exercises: Supine   Flexion AAROM;Both;10 reps      Shoulder Exercises: Seated   External Rotation Left;10 reps;AAROM   cane   Other Seated Exercises scap retraction with shoulder ext (sliding hands on thighs) x 5 sec x 5 reps    Other Seated Exercises shoulder rolls x 5      Shoulder Exercises: Standing   Internal Rotation AAROM;Left;10 reps   cane   Extension AAROM;Both;10 reps   cane     Shoulder Exercises: ROM/Strengthening   Nustep L2: slow gentle motion 6 min, for ROM of shoulder and LE.      Shoulder Exercises: Stretch   Table Stretch - Flexion 5 reps;10 seconds   holding rollator, rolling forward   Table Stretch - ABduction Limitations therapist demo for HEP.    Table Stretch - External Rotation Limitations therapist demo for HEP.                     PT Education - 09/03/21 1152     Education Details HEP    Person(s) Educated Patient    Methods Explanation;Demonstration;Verbal cues;Handout    Comprehension Verbalized understanding;Returned demonstration              PT Short Term Goals - 09/03/21 1056       PT SHORT TERM GOAL #1   Title Patient will be independent in her home exercise program for shoulder ROM.    Time 4    Period Weeks    Status On-going    Target Date 09/29/21      PT SHORT TERM GOAL #2   Title Patient will increase left shoulder flexion to >/= 120 degrees for more functional reach.    Baseline 92 degrees    Time 4    Period Weeks    Status On-going    Target Date 09/29/21      PT SHORT TERM GOAL #3   Title Patient will  increase left shoulder abduction to >/= 120 degrees to more easily  obtain surgical position and reach with greater ease.    Baseline 91 degrees    Time 4    Period Weeks    Status On-going    Target Date 09/29/21      PT SHORT TERM GOAL #4   Title Patient will improve her DASH score to be </= 40 for improved overall UE function.    Baseline 61.36    Time 4    Period Weeks    Status On-going    Target Date 09/29/21      PT SHORT TERM GOAL #5   Title Patient will report >/= 25% decrease in left shoulder pain with daily tasks.    Time 4    Period Weeks    Status On-going    Target Date 09/29/21               PT Long Term Goals - 09/03/21 1055       PT LONG TERM GOAL #1   Title Patient will demonstrate she has regained left shoulder function post operatively when compared to baselines.    Time 8    Period Weeks    Status On-going                   Plan - 09/03/21 1033     Clinical Impression Statement Pt reported some initial popping in Lt shoulder with seated AAROM ER; encouraged to go to tissue limits for stretch.  She reported reduction of RLE /Lt shoulder pain with use of NuStep motion and AAROM exercises.  Goals are ongoing.    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    Examination-Activity Limitations Caring for Others;Stand;Reach Overhead;Locomotion Level    Examination-Participation Restrictions Cleaning;Shop    Stability/Clinical Decision Making Evolving/Moderate complexity    Rehab Potential Good    PT Frequency 2x / week    PT Duration 4 weeks   Followed by 1 post op reassessment   PT Treatment/Interventions ADLs/Self Care Home Management;Therapeutic exercise;Therapeutic activities;Patient/family education;Manual techniques;Passive range of motion    PT Next Visit Plan continue PROM left shoulder and AAROM exercises    PT Home Exercise Plan Post op  HEP    Consulted and Agree with Plan of Care Patient;Family member/caregiver    Family Member Consulted niece             Patient will benefit from skilled therapeutic intervention in order to improve the following deficits and impairments:  Decreased knowledge of precautions, Impaired UE functional use, Pain, Postural dysfunction, Decreased range of motion  Visit Diagnosis: Chronic left shoulder pain  Stiffness of left shoulder, not elsewhere classified  Abnormal posture     Problem List Patient Active Problem List   Diagnosis Date Noted   Malignant neoplasm of upper-outer quadrant of left breast in female, estrogen receptor positive ([place]) 08/27/2021   Abnormal ultrasound of endometrium 04/08/2021   Kerin Perna, PTA 09/03/21 11:54 AM  [place], [place], [address] Phone: [phone]   Fax:  [phone]  Name: Corlene Sabia MRN: [id number] Date of Birth: 03/15/1946

## 2021-09-03 NOTE — Patient Instructions (Signed)
Access Code: [email or web address] URL: [email or web address] Date: 09/03/2021 Prepared by: Colona  Exercises Seated Shoulder Flexion Towel Slide at Table Top - 2 x daily - 7 x weekly - 3 sets - 5 reps - 10 seconds hold Seated Shoulder External Rotation PROM on Table - 2 x daily - 7 x weekly - 1 sets - 5 reps - 10 seconds hold Seated Shoulder Abduction Towel Slide at Table Top with Forearm in Neutral - 1 x daily - 7 x weekly - 1 sets - 5 reps - 5 seconds hold Seated Scapular Retraction - 2 x daily - 7 x weekly - 1 sets - 10 reps - 5 seconds hold Seated Shoulder External Rotation AAROM with Cane and Hand in Neutral - 2 x daily - 7 x weekly - 1 sets - 5-10 reps Standing Shoulder Extension with Dowel - 1 x daily - 7 x weekly - 1 sets - 5-10 reps Standing Shoulder Internal Rotation AAROM with Dowel - 2 x daily - 7 x weekly - 1 sets - 5-10 reps Supine Shoulder Flexion Extension AAROM with Dowel - 1 x daily - 7 x weekly - 1 sets - 5-10 reps - 5 -10 seconds hold

## 2021-09-03 NOTE — Telephone Encounter (Signed)
Spoke with patient to follow up from [facility] 9/28 and assess navigation needs. She is requesting to wait 2-3 weeks before proceeding with the additional biopsies. She states it is to uncomfortable and painful for her at this time. She is also still considering having a mastectomy. Informed I would let Dr. Donne Hazel know and get his recommendations.  She is also requesting a PT referral for her right leg which she strained getting off the MRI table previously.  Orders placed. Informed her I would let her know about the biopsies when I talk with Dr. Donne Hazel. Patient verbalized understanding.

## 2021-09-06 ENCOUNTER — Encounter: Payer: Self-pay | Admitting: *Deleted

## 2021-09-07 ENCOUNTER — Encounter: Payer: Self-pay | Admitting: Physical Therapy

## 2021-09-07 ENCOUNTER — Encounter: Payer: Self-pay | Admitting: General Practice

## 2021-09-07 ENCOUNTER — Ambulatory Visit (INDEPENDENT_AMBULATORY_CARE_PROVIDER_SITE_OTHER): Payer: Medicare Other | Admitting: Physical Therapy

## 2021-09-07 ENCOUNTER — Other Ambulatory Visit: Payer: Self-pay

## 2021-09-07 DIAGNOSIS — R293 Abnormal posture: Secondary | ICD-10-CM

## 2021-09-07 DIAGNOSIS — G8929 Other chronic pain: Secondary | ICD-10-CM | POA: Diagnosis not present

## 2021-09-07 DIAGNOSIS — M25512 Pain in left shoulder: Secondary | ICD-10-CM

## 2021-09-07 DIAGNOSIS — M25612 Stiffness of left shoulder, not elsewhere classified: Secondary | ICD-10-CM | POA: Diagnosis not present

## 2021-09-07 NOTE — Therapy (Addendum)
[place]  [place], [place], [address] Phone: [phone]   Fax:  [phone]  Physical Therapy Treatment  Patient Details  Name: Brooke Duncan MRN: [id number] Date of Birth: 1946-09-27 Referring Provider (PT): Dr. Rolm Bookbinder   Encounter Date: 09/07/2021   PT End of Session - 09/07/21 0856     Visit Number 3    Number of Visits 9    Date for PT Re-Evaluation 09/29/21   Then pt will see PT 3 weeks post op (around 02/2022) for reassessment   PT Start Time 0847    PT Stop Time 0926    PT Time Calculation (min) 39 min    Activity Tolerance Patient tolerated treatment well    Behavior During Therapy [facility] for tasks assessed/performed             Past Medical History:  Diagnosis Date   Acquired solitary kidney    right side 2014   Arthritis    Frequency of urination    Heart murmur    per told by pcp approx. 02/ 2022 heard a very faint murmur, told did need work-up done at this time   History of Hashimoto thyroiditis    s/p  total thyroidectomy 1995   History of hypercalcemia    s/p  parathryoidectomy 07/ 2021   History of renal cell carcinoma 2014   s/p  left nephrectomy, pt stated no other treatment   Hypertension    followed by pcp   Hypothyroidism, postsurgical 1995   followed by pcp in [place], moved from [place] 01/ 2021   OSA on CPAP    Thickened endometrium    Wears glasses     Past Surgical History:  Procedure Laterality Date   HYSTEROSCOPY WITH D & C N/A 04/08/2021   Procedure: DILATATION AND CURETTAGE /HYSTEROSCOPY, EXCISION OF VAGINAL LESION;  Surgeon: Delsa Bern, MD;  Location: [facility];  Service: Gynecology;  Laterality: N/A;   LAPAROSCOPIC CHOLECYSTECTOMY  2005   NEPHRECTOMY Left 2014   PARATHYROIDECTOMY  07/ 2021  in [place]  age 32   TOTAL HIP ARTHROPLASTY Bilateral left 2015;  right 2009   TOTAL KNEE ARTHROPLASTY Left 2014    TOTAL THYROIDECTOMY Bilateral 1995    There were no vitals filed for this visit.   Subjective Assessment - 09/07/21 0856     Subjective Pt reports that her Lt shoulder and Rt leg are feeling a little better.   She is doing exercises 1x/day.  She continues to be challenged with ER with cane.    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 There are multiple areas of concern in the upper outer quadrant measuring 1.6 - 5.2 cm. The area biopsied is ER/PR positive and HER2 negative with a Ki67 of 15%. She is scheduled for additional biopsies. She has a history of renal cell carcinoma treated in 2014. She has had bilateral hip replacements and a left knee replacement; needs a right knee replacement when she is able to have it done. She is unsure of the dates of her joint replacements.    Patient Stated Goals Get arm moving better to tolerate surgery; prevent lymphedema and learn post op HEP    Currently in Pain? Yes    Pain Score 5     Pain Location Leg    Pain Orientation Right;Posterior  Pain Descriptors / Indicators Sore    Pain Onset More than a month ago    Pain Score 3    Pain Location Shoulder    Pain Orientation Left    Pain Descriptors / Indicators Sore    Pain Onset More than a month ago    Aggravating Factors  overhead    Pain Relieving Factors resting                [facility] PT Assessment - 09/07/21 0001       Assessment   Medical Diagnosis Left breast cancer; left shoulder pain    Referring Provider (PT) Dr. Rolm Bookbinder    Onset Date/Surgical Date 06/09/21    Hand Dominance Right    Prior Therapy none      AROM   Left Shoulder Flexion 92 Degrees              [facility] Adult PT Treatment/Exercise - 09/07/21 0001       Shoulder Exercises: Supine   Horizontal ABduction Left;AAROM;5 reps   and horiz add, cane   External Rotation Left;AAROM;5 reps    cane   Flexion AAROM;Both;5 reps   5-10 sec holds, cane     Shoulder Exercises: Seated   Row Both;10 reps;AAROM;Strengthening   sliding hands on lap x 5, with band x 5   Theraband Level (Shoulder Row) Level 1 (Yellow)    External Rotation Left;10 reps;AAROM   cane   Other Seated Exercises shoulder rolls x 5      Shoulder Exercises: Standing   Internal Rotation AAROM;Left;10 reps   cane   Extension AAROM;Both;5 reps   cane     Shoulder Exercises: ROM/Strengthening   Nustep L5: slow gentle motion 7 min, for ROM of shoulder and LE.      Shoulder Exercises: Stretch   External Rotation Stretch 5 reps;10 seconds    Wall Stretch - ABduction 5 reps;10 seconds   scaption   Table Stretch - Flexion 5 reps;10 seconds   holding rollator, rolling forward   Star Gazer Stretch 3 reps;10 seconds   back of palm to forehead   Other Shoulder Stretches Lt bicep brachii stretch holding elevated table x 15 sec x 2                       PT Short Term Goals - 09/03/21 1056       PT SHORT TERM GOAL #1   Title Patient will be independent in her home exercise program for shoulder ROM.    Time 4    Period Weeks    Status On-going    Target Date 09/29/21      PT SHORT TERM GOAL #2   Title Patient will increase left shoulder flexion to >/= 120 degrees for more functional reach.    Baseline 92 degrees    Time 4    Period Weeks    Status On-going    Target Date 09/29/21      PT SHORT TERM GOAL #3   Title Patient will increase left shoulder abduction to >/= 120 degrees to more easily obtain surgical position and reach with greater ease.    Baseline 91 degrees    Time 4    Period Weeks    Status On-going    Target Date 09/29/21      PT SHORT TERM GOAL #4   Title Patient will improve her DASH score to be </= 40 for improved overall UE function.  Baseline 61.36    Time 4    Period Weeks    Status On-going    Target Date 09/29/21      PT SHORT TERM GOAL #5   Title Patient will  report >/= 25% decrease in left shoulder pain with daily tasks.    Time 4    Period Weeks    Status On-going    Target Date 09/29/21               PT Long Term Goals - 09/03/21 1055       PT LONG TERM GOAL #1   Title Patient will demonstrate she has regained left shoulder function post operatively when compared to baselines.    Time 8    Period Weeks    Status On-going                   Plan - 09/07/21 2423     Clinical Impression Statement Pt moving through [facility] with cane with greater ease today. Continues to have limited Lt shoulder ROM, especially in ER/IR/and ext.  Gradually progressing towards ROM goals for shoulder.    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  continue Lt shoulder PROM/ AAROM exercises    PT Home Exercise Plan Post op HEP    Consulted and Agree with Plan of Care Patient;Family member/caregiver    Family Member Consulted niece             Patient will benefit from skilled therapeutic intervention in order to improve the following deficits and impairments:  Decreased knowledge of precautions, Impaired UE functional use, Pain, Postural dysfunction, Decreased range of motion  Visit Diagnosis: Chronic left shoulder pain  Stiffness of  left shoulder, not elsewhere classified  Abnormal posture     Problem List Patient Active Problem List   Diagnosis Date Noted   Malignant neoplasm of upper-outer quadrant of left breast in female, estrogen receptor positive ([facility]) 08/27/2021   Abnormal ultrasound of endometrium 04/08/2021   Brooke Duncan, Brooke Duncan 09/07/21 12:21 PM   [place], [place], [address] Phone: [phone]   Fax:  [phone]  Name: Brooke Duncan MRN: [id number] Date of Birth: 12-13-1945

## 2021-09-07 NOTE — Progress Notes (Signed)
[name] Spiritual Care Note  Made pastoral [facility] follow-up phone call, providing emotional support and encouragement as Brooke Duncan shared about her shoulder PT progress (which she is hoping will inform the timing of her follow-up breast biopsies) and a special visit from her husband's sister, who lives out of state (which is providing meaning, encouragement, and support).  We plan to follow up by phone in another two weeks for a pastoral check-in and to get a sense of the biopsy timing because learning the results and treatment path will help inform an Network engineer Industrial/product designer) match.   [place], [place], [facility] Pager [phone] Voicemail [phone]

## 2021-09-08 ENCOUNTER — Encounter: Payer: Self-pay | Admitting: *Deleted

## 2021-09-08 ENCOUNTER — Telehealth: Payer: Self-pay | Admitting: *Deleted

## 2021-09-08 NOTE — Telephone Encounter (Signed)
Spoke with patient regarding follow up with the biopsies.  Informed Dr. Donne Hazel patient would like to delay bx's 2-3 weeks due to the pain in her shoulder.  I informed the patient that she needed to go ahead and call GI back to schedule bx's in 2-3 weeks as it is hard to get these appointments last minute.  Patient states she will do this.  Instructed her also if she decides in the meantime that she wants a mastectomy to call Dr. Cristal Generous office or myself to let us know.  Patient verbalized understanding.

## 2021-09-10 ENCOUNTER — Ambulatory Visit (INDEPENDENT_AMBULATORY_CARE_PROVIDER_SITE_OTHER): Payer: Medicare Other | Admitting: Rehabilitative and Restorative Service Providers"

## 2021-09-10 ENCOUNTER — Other Ambulatory Visit: Payer: Self-pay

## 2021-09-10 DIAGNOSIS — M25612 Stiffness of left shoulder, not elsewhere classified: Secondary | ICD-10-CM | POA: Diagnosis not present

## 2021-09-10 DIAGNOSIS — G8929 Other chronic pain: Secondary | ICD-10-CM | POA: Diagnosis not present

## 2021-09-10 DIAGNOSIS — R29898 Other symptoms and signs involving the musculoskeletal system: Secondary | ICD-10-CM | POA: Diagnosis not present

## 2021-09-10 DIAGNOSIS — Z170 Estrogen receptor positive status [ER+]: Secondary | ICD-10-CM

## 2021-09-10 DIAGNOSIS — M25562 Pain in left knee: Secondary | ICD-10-CM | POA: Diagnosis not present

## 2021-09-10 DIAGNOSIS — M25512 Pain in left shoulder: Secondary | ICD-10-CM | POA: Diagnosis not present

## 2021-09-10 DIAGNOSIS — C50412 Malignant neoplasm of upper-outer quadrant of left female breast: Secondary | ICD-10-CM

## 2021-09-10 DIAGNOSIS — R293 Abnormal posture: Secondary | ICD-10-CM

## 2021-09-10 NOTE — Therapy (Signed)
[place], [place], [address] Phone: [phone]   Fax:  [phone]  Physical Therapy Treatment and Re-eval for Rt LE pain   Patient Details  Name: Brooke Duncan MRN: [id number] Date of Birth: October 20, 1946 Referring Provider (PT): Dr. Rolm Bookbinder   Encounter Date: 09/10/2021   PT End of Session - 09/10/21 1014     Visit Number 4    Number of Visits 21    Date for PT Re-Evaluation 10/22/21    Progress Note Due on Visit 10    PT Start Time 0930    PT Stop Time 1020    PT Time Calculation (min) 50 min    Activity Tolerance Patient tolerated treatment well             Past Medical History:  Diagnosis Date   Acquired solitary kidney    right side 2014   Arthritis    Frequency of urination    Heart murmur    per told by pcp approx. 02/ 2022 heard a very faint murmur, told did need work-up done at this time   History of Hashimoto thyroiditis    s/p  total thyroidectomy 1995   History of hypercalcemia    s/p  parathryoidectomy 07/ 2021   History of renal cell carcinoma 2014   s/p  left nephrectomy, pt stated no other treatment   Hypertension    followed by pcp   Hypothyroidism, postsurgical 1995   followed by pcp in [place], moved from [place] 01/ 2021   OSA on CPAP    Thickened endometrium    Wears glasses     Past Surgical History:  Procedure Laterality Date   HYSTEROSCOPY WITH D & C N/A 04/08/2021   Procedure: DILATATION AND CURETTAGE /HYSTEROSCOPY, EXCISION OF VAGINAL LESION;  Surgeon: Delsa Bern, MD;  Location: [facility];  Service: Gynecology;  Laterality: N/A;   LAPAROSCOPIC CHOLECYSTECTOMY  2005   NEPHRECTOMY Left 2014   PARATHYROIDECTOMY  07/ 2021  in [place]  age 75   TOTAL HIP ARTHROPLASTY Bilateral left 2015;  right 2009   TOTAL KNEE ARTHROPLASTY Left 2014   TOTAL THYROIDECTOMY Bilateral 1995    There were no vitals filed for  this visit.   Subjective Assessment - 09/10/21 0935     Subjective Patient reports that she has had Rt posterior thigh and knee pain since she was on her stomach for a proceedure 08/30/21 and moved to get off the table. She felt pain in the Lt posterior thigh and calf which has persisted since the time of injury. History of chornic Rt knee pain. Received injection 1-2 weeks before injury.    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 There are multiple areas of concern in the upper outer quadrant measuring 1.6 - 5.2 cm. The area biopsied is ER/PR positive and HER2 negative with a Ki67 of 15%. She is scheduled for additional biopsies. She has a history of renal cell carcinoma treated in 2014. She has had bilateral hip replacements and a left knee replacement; needs a right knee replacement when she is able to have it done. She is unsure of the dates of her joint replacements.    Patient Stated Goals Get the Rt knee better. Get arm moving better to tolerate surgery; prevent lymphedema and learn post op HEP    Currently in Pain? Yes    Pain Score 3  8-9/10 with walking   Pain Location Leg    Pain Orientation Right;Posterior    Pain Descriptors / Indicators Sore;Nagging   sharp with walking   Pain Type Acute pain;Chronic pain    Pain Radiating Towards pain posterior thigh to calf    Pain Onset 1 to 4 weeks ago    Pain Frequency Intermittent    Aggravating Factors  walking; worse with going down incline    Pain Relieving Factors resting                [facility] PT Assessment - 09/10/21 0001       Assessment   Medical Diagnosis Left breast cancer; left shoulder pain, Rt knee/LE pain    Referring Provider (PT) Dr. Rolm Bookbinder    Onset Date/Surgical Date 06/09/21   08/30/21 injury to Rt leg   Hand Dominance Right    Prior Therapy none      Posture/Postural Control   Posture Comments forward posture flexed at hips       AROM   Right/Left Hip --   end range tightness bilat hips all planes   Right Knee Extension -8    Right Knee Flexion 89    Left Knee Extension 0    Left Knee Flexion 95      Strength   Overall Strength Comments functioinal strength bilat LE's not assessed resistively      Flexibility   Hamstrings tight Rt ~ 60 deg; Lt 70 deg    ITB tight Rt > Lt    Piriformis tight bilat      Palpation   Palpation comment tight Rt posterior thigh to calf      Ambulation/Gait   Gait Comments antalgic gait, limp Rt LE with WB on Rt with rollator                           [facility] Adult PT Treatment/Exercise - 09/10/21 0001       Self-Care   Self-Care Other Self-Care Comments    Other Self-Care Comments  myofacial ball release work Rt hamstring sitting      Knee/Hip Exercises: Stretches   Passive Hamstring Stretch Right;3 reps;30 seconds   supine with strap   ITB Stretch Right;2 reps;30 seconds   supine with strap   Gastroc Stretch Right;2 reps;30 seconds   standing at rollator   Other Knee/Hip Stretches hip adductor stretch Rt 30 sec x 2 reps with strap      Moist Heat Therapy   Number Minutes Moist Heat 10 Minutes    Moist Heat Location Hip   posterior hamstrings sitting                    PT Education - 09/10/21 1005     Education Details HEP; myofacial ball release work    Forensic psychologist) Educated Patient    Methods Explanation;Demonstration;Tactile cues;Verbal cues;Handout    Comprehension Verbalized understanding;Returned demonstration;Verbal cues required;Tactile cues required              PT Short Term Goals - 09/10/21 1020       PT SHORT TERM GOAL #1   Title Patient will be independent in her home exercise program for shoulder ROM.    Time 6    Period Weeks    Status On-going    Target Date 10/22/21      PT SHORT TERM GOAL #2   Title Patient will increase left shoulder flexion to >/=  120 degrees for more functional reach.    Baseline -     Time 6    Period Weeks    Status On-going    Target Date 10/22/21      PT SHORT TERM GOAL #3   Title Patient will increase left shoulder abduction to >/= 120 degrees to more easily obtain surgical position and reach with greater ease.    Baseline -    Time 6    Period Weeks    Status On-going    Target Date 10/22/21      PT SHORT TERM GOAL #4   Title Patient will improve her DASH score to be </= 40 for improved overall UE function.    Baseline -    Time 6    Period Weeks    Status On-going    Target Date 10/22/21      PT SHORT TERM GOAL #5   Title Patient will report >/= 25% decrease in left shoulder pain with daily tasks.    Time 6    Period Weeks    Status On-going    Target Date 10/22/21               PT Long Term Goals - 09/10/21 1408       PT LONG TERM GOAL #1   Title Patient will demonstrate she has regained left shoulder function post operatively when compared to baselines.    Time 8    Period Weeks    Status On-going    Target Date 10/22/21      PT LONG TERM GOAL #2   Title Decrease pain Rt LE by 50-75% allowing patient to stand, walk, move in/out of transitional movements with improved safety and decreased pain    Time 6    Period Weeks    Status New    Target Date 10/22/21      PT LONG TERM GOAL #3   Title Improve tissue extensibility Rt hamstrings and calf with patient to demonstrate ~ 80 deg hamstring ROM in supine    Time 6    Period Weeks    Status New    Target Date 10/22/21      PT LONG TERM GOAL #4   Title Patient demonstrates improved gait pattern with rollator with decreased antalgic gait pattern and greater safety/independence    Time 6    Period Weeks    Status New    Target Date 10/22/21      PT LONG TERM GOAL #5   Title Independent in HEP for LE's as indicated    Time 6    Period Weeks    Status New    Target Date 10/22/21                   Plan - 09/10/21 0953     Clinical Impression Statement Patient  presents with Rt posterior thigh and calf pain following injury when she was getting off a procedure table 08/30/21. She has limtied Rt LE ROM; muscular tightness to palpation; pain with functional activities and at rest; antalgic gait. She will benefit from PT to address problems identified.    Stability/Clinical Decision Making Stable/Uncomplicated    Clinical Decision Making Low    Rehab Potential Good    PT Frequency 2x / week    PT Duration 6 weeks    PT Treatment/Interventions ADLs/Self Care Home Management;Therapeutic exercise;Therapeutic activities;Patient/family education;Manual techniques;Passive range of motion    PT Next Visit Plan review  HEP; manual work vs DN Rt hamstring/calf; continue PROM left shoulder and AAROM exercises    PT Home Exercise Plan Post op HEP; HEP    Consulted and Agree with Plan of Care Patient;Family member/caregiver    Family Member Consulted niece             Patient will benefit from skilled therapeutic intervention in order to improve the following deficits and impairments:  Decreased knowledge of precautions, Impaired UE functional use, Pain, Postural dysfunction, Decreased range of motion, Abnormal gait, Decreased activity tolerance, Impaired flexibility, Decreased mobility, Difficulty walking  Visit Diagnosis: Chronic left shoulder pain  Stiffness of left shoulder, not elsewhere classified  Abnormal posture  Malignant neoplasm of upper-outer quadrant of left breast in female, estrogen receptor positive ([facility])  Acute pain of left knee  Other symptoms and signs involving the musculoskeletal system     Problem List Patient Active Problem List   Diagnosis Date Noted   Malignant neoplasm of upper-outer quadrant of left breast in female, estrogen receptor positive ([place]) 08/27/2021   Abnormal ultrasound of endometrium 04/08/2021    Ambrosio Reuter Nilda Simmer, PT, MPH  09/10/2021, 2:17 PM  [facility] [place]  [place], [place], [address] Phone: [phone]   Fax:  [phone]  Name: Brooke Duncan MRN: [id number] Date of Birth: [birth year]

## 2021-09-10 NOTE — Patient Instructions (Signed)
Access Code: [email or web address] URL: [email or web address] Date: 09/10/2021 Prepared by: Gillermo Murdoch  Exercises Roller Massage Elongated IT Band Release - 2 x daily - 7 x weekly - 1 sets - 1 reps - 2 minutes hold Standing Heel Raise with Support - 2 x daily - 7 x weekly - 1 sets - 10 reps Supine Hamstring Stretch - 2 x daily - 7 x weekly - 1 sets - 3 reps - 30 seconds hold Hooklying Hamstring Stretch with Strap - 2 x daily - 7 x weekly - 1 sets - 3 reps - 30 sec hold Hip Adductors and Hamstring Stretch with Strap - 2 x daily - 7 x weekly - 1 sets - 3 reps - 30 sec hold Supine Bridge - 2 x daily - 7 x weekly - 1 sets - 10 reps Sidelying Pelvic Floor Contraction with Hip Abduction - 2 x daily - 7 x weekly - 1 sets - 10 reps Hooklying Isometric Clamshell - 2 x daily - 7 x weekly - 1 sets - 10 reps - 3 sec hold Supine Piriformis Stretch with Leg Straight - 2 x daily - 7 x weekly - 1 sets - 3 reps - 30 sec hold Sit to Stand - 2 x daily - 7 x weekly - 1 sets - 10 reps - 3-5 sec hold Wall Quarter Squat - 2 x daily - 7 x weekly - 1-2 sets - 10 reps - 5-10 sec hold Single Leg Stance - 2 x daily - 7 x weekly - 2 sets - 3-5 reps - 10-20 sec hold Supine Hip Adduction Isometric with Ball - 2 x daily - 7 x weekly - 1 sets - 10 reps - 10 sec hold Supine Knee Extension Strengthening - 2 x daily - 7 x weekly - 1-2 sets - 10 reps - 5 sec hold Small Range Straight Leg Raise - 2 x daily - 7 x weekly - 1 sets - 10 reps - 2-3 sec hold Straight Leg Raise with External Rotation - 2 x daily - 7 x weekly - 1 sets - 10 reps - 3-5 sec hold Single Leg Bridge - 2 x daily - 7 x weekly - 1 sets - 10 reps - 5 sec hold

## 2021-09-13 ENCOUNTER — Telehealth: Payer: Self-pay | Admitting: *Deleted

## 2021-09-13 ENCOUNTER — Encounter: Payer: Self-pay | Admitting: *Deleted

## 2021-09-13 NOTE — Telephone Encounter (Signed)
Received oncotype results of 21/7%.  Patient is aware.  Encouraged her to call GI to get biopsies scheduled in 2 wks as appts fill up.  Patient states she will call them back and schedule. Team notified.

## 2021-09-14 ENCOUNTER — Ambulatory Visit (INDEPENDENT_AMBULATORY_CARE_PROVIDER_SITE_OTHER): Payer: Medicare Other | Admitting: Physical Therapy

## 2021-09-14 ENCOUNTER — Other Ambulatory Visit: Payer: Self-pay

## 2021-09-14 DIAGNOSIS — G8929 Other chronic pain: Secondary | ICD-10-CM | POA: Diagnosis not present

## 2021-09-14 DIAGNOSIS — R29898 Other symptoms and signs involving the musculoskeletal system: Secondary | ICD-10-CM | POA: Diagnosis not present

## 2021-09-14 DIAGNOSIS — R293 Abnormal posture: Secondary | ICD-10-CM

## 2021-09-14 DIAGNOSIS — M25512 Pain in left shoulder: Secondary | ICD-10-CM | POA: Diagnosis present

## 2021-09-14 DIAGNOSIS — M25612 Stiffness of left shoulder, not elsewhere classified: Secondary | ICD-10-CM | POA: Diagnosis not present

## 2021-09-14 DIAGNOSIS — M79604 Pain in right leg: Secondary | ICD-10-CM | POA: Diagnosis not present

## 2021-09-14 NOTE — Therapy (Signed)
[place], [place], [address] Phone: [phone]   Fax:  [phone]  Physical Therapy Treatment  Patient Details  Name: Brooke Duncan MRN: [id number] Date of Birth: 06/24/1946 Referring Provider (PT): Dr. Rolm Bookbinder   Encounter Date: 09/14/2021   PT End of Session - 09/14/21 0939     Visit Number 5    Number of Visits 21    Date for PT Re-Evaluation 10/22/21    Progress Note Due on Visit 10    PT Start Time 0933    PT Stop Time 1013    PT Time Calculation (min) 40 min    Activity Tolerance Patient tolerated treatment well    Behavior During Therapy [facility] for tasks assessed/performed             Past Medical History:  Diagnosis Date   Acquired solitary kidney    right side 2014   Arthritis    Frequency of urination    Heart murmur    per told by pcp approx. 02/ 2022 heard a very faint murmur, told did need work-up done at this time   History of Hashimoto thyroiditis    s/p  total thyroidectomy 1995   History of hypercalcemia    s/p  parathryoidectomy 07/ 2021   History of renal cell carcinoma 2014   s/p  left nephrectomy, pt stated no other treatment   Hypertension    followed by pcp   Hypothyroidism, postsurgical 1995   followed by pcp in [place], moved from [place] 01/ 2021   OSA on CPAP    Thickened endometrium    Wears glasses     Past Surgical History:  Procedure Laterality Date   HYSTEROSCOPY WITH D & C N/A 04/08/2021   Procedure: DILATATION AND CURETTAGE /HYSTEROSCOPY, EXCISION OF VAGINAL LESION;  Surgeon: Delsa Bern, MD;  Location: [place];  Service: Gynecology;  Laterality: N/A;   LAPAROSCOPIC CHOLECYSTECTOMY  2005   NEPHRECTOMY Left 2014   PARATHYROIDECTOMY  07/ 2021  in [place]  age 75   TOTAL HIP ARTHROPLASTY Bilateral left 2015;  right 2009   TOTAL KNEE ARTHROPLASTY Left 2014   TOTAL THYROIDECTOMY Bilateral 1995     There were no vitals filed for this visit.   Subjective Assessment - 09/14/21 0937     Subjective Pt reports she is moving her Lt shoulder a little better; able to reach night stand easier (abdct).    Rt leg "is loosening up some".    Patient Stated Goals Get the Rt knee better. Get arm moving better to tolerate surgery; prevent lymphedema and learn post op HEP    Currently in Pain? Yes    Pain Score 4     Pain Location Shoulder    Pain Orientation Left    Pain Descriptors / Indicators --   pinching               [facility] PT Assessment - 09/14/21 0001       Assessment   Medical Diagnosis Left breast cancer; left shoulder pain, Rt knee/LE pain    Referring Provider (PT) Dr. Rolm Bookbinder    Onset Date/Surgical Date 06/09/21   08/30/21 injury to Rt leg   Hand Dominance Right    Prior Therapy none      AROM   Left Shoulder Extension 42 Degrees    Left Shoulder Flexion 100 Degrees  [place] Adult PT Treatment/Exercise - 09/14/21 0001       Knee/Hip Exercises: Stretches   Passive Hamstring Stretch 2 reps;30 seconds;Left;Right   seated hip hinge   Gastroc Stretch Right;Left;2 reps;20 seconds   runner stretch     Shoulder Exercises: Seated   Row Both;20 reps    Theraband Level (Shoulder Row) Level 1 (Yellow)    External Rotation Left;10 reps;AAROM   cane   Abduction Left;10 reps;AAROM   cane, scaption     Shoulder Exercises: Standing   Internal Rotation AAROM;Left;10 reps   cane   Extension AAROM;Both;10 reps   cane   Other Standing Exercises mini squats holding rollator x 10,  lacing hands and placing to top of head for LUE stretch x 2 reps    Other Standing Exercises wall ladder flexion with LUE to #23 x 3 reps      Shoulder Exercises: Pulleys   Flexion --   10 reps, 10 sec hold   Scaption --   10 reps, 5 sec hold each     Shoulder Exercises: ROM/Strengthening   Nustep L4: arms/ legs x 6 min for ROM and warm up.                PT  Short Term Goals - 09/10/21 1020       PT SHORT TERM GOAL #1   Title Patient will be independent in her home exercise program for shoulder ROM.    Time 6    Period Weeks    Status On-going    Target Date 10/22/21      PT SHORT TERM GOAL #2   Title Patient will increase left shoulder flexion to >/= 120 degrees for more functional reach.    Baseline -    Time 6    Period Weeks    Status On-going    Target Date 10/22/21      PT SHORT TERM GOAL #3   Title Patient will increase left shoulder abduction to >/= 120 degrees to more easily obtain surgical position and reach with greater ease.    Baseline -    Time 6    Period Weeks    Status On-going    Target Date 10/22/21      PT SHORT TERM GOAL #4   Title Patient will improve her DASH score to be </= 40 for improved overall UE function.    Baseline -    Time 6    Period Weeks    Status On-going    Target Date 10/22/21      PT SHORT TERM GOAL #5   Title Patient will report >/= 25% decrease in left shoulder pain with daily tasks.    Time 6    Period Weeks    Status On-going    Target Date 10/22/21               PT Long Term Goals - 09/10/21 1408       PT LONG TERM GOAL #1   Title Patient will demonstrate she has regained left shoulder function post operatively when compared to baselines.    Time 8    Period Weeks    Status On-going    Target Date 10/22/21      PT LONG TERM GOAL #2   Title Decrease pain Rt LE by 50-75% allowing patient to stand, walk, move in/out of transitional movements with improved safety and decreased pain    Time 6    Period Weeks  Status New    Target Date 10/22/21      PT LONG TERM GOAL #3   Title Improve tissue extensibility Rt hamstrings and calf with patient to demonstrate ~ 80 deg hamstring ROM in supine    Time 6    Period Weeks    Status New    Target Date 10/22/21      PT LONG TERM GOAL #4   Title Patient demonstrates improved gait pattern with rollator with decreased  antalgic gait pattern and greater safety/independence    Time 6    Period Weeks    Status New    Target Date 10/22/21      PT LONG TERM GOAL #5   Title Independent in HEP for LE's as indicated    Time 6    Period Weeks    Status New    Target Date 10/22/21                   Plan - 09/14/21 0954     Clinical Impression Statement Pt's Lt shoulder ROM gradually improving.  She is tolerating exercises well, with minimal increase in discomfort in shoulder.  Less reports of posterior Rt leg pain with exercises today.  Pt progressing towards LTGs.    Stability/Clinical Decision Making Stable/Uncomplicated    Rehab Potential Good    PT Frequency 2x / week    PT Duration 6 weeks    PT Treatment/Interventions ADLs/Self Care Home Management;Therapeutic exercise;Therapeutic activities;Patient/family education;Manual techniques;Passive range of motion    PT Next Visit Plan consolidate HEP; manual work vs DN Rt hamstring/calf; continue PROM left shoulder and AAROM exercises    PT Home Exercise Plan Post op HEP; HEP    Consulted and Agree with Plan of Care Patient    Family Member Consulted --             Patient will benefit from skilled therapeutic intervention in order to improve the following deficits and impairments:  Decreased knowledge of precautions, Impaired UE functional use, Pain, Postural dysfunction, Decreased range of motion, Abnormal gait, Decreased activity tolerance, Impaired flexibility, Decreased mobility, Difficulty walking  Visit Diagnosis: Chronic left shoulder pain  Stiffness of left shoulder, not elsewhere classified  Abnormal posture  Pain in right leg  Other symptoms and signs involving the musculoskeletal system     Problem List Patient Active Problem List   Diagnosis Date Noted   Malignant neoplasm of upper-outer quadrant of left breast in female, estrogen receptor positive ([place]) 08/27/2021   Abnormal ultrasound of endometrium 04/08/2021    Kerin Perna, PTA 09/14/21 10:16 AM  [place], [place], [address] Phone: [phone]   Fax:  [phone]  Name: Brooke Duncan MRN: [id number] Date of Birth: 1946-05-15

## 2021-09-14 NOTE — Patient Instructions (Signed)
Access Code: [email or web address] URL: [email or web address] Date: 09/14/2021 Prepared by: [place]  Exercises  Added : Seated Shoulder Abduction AAROM with [place]el - 1 x daily - 7 x weekly - 1 sets - 5-10 reps Seated Shoulder Row with Anchored Resistance - 1 x daily - 7 x weekly - 1 sets - 10 reps

## 2021-09-15 ENCOUNTER — Encounter: Payer: Self-pay | Admitting: *Deleted

## 2021-09-17 ENCOUNTER — Other Ambulatory Visit: Payer: Self-pay

## 2021-09-17 ENCOUNTER — Ambulatory Visit (INDEPENDENT_AMBULATORY_CARE_PROVIDER_SITE_OTHER): Payer: Medicare Other | Admitting: Physical Therapy

## 2021-09-17 DIAGNOSIS — G8929 Other chronic pain: Secondary | ICD-10-CM

## 2021-09-17 DIAGNOSIS — R293 Abnormal posture: Secondary | ICD-10-CM

## 2021-09-17 DIAGNOSIS — M79604 Pain in right leg: Secondary | ICD-10-CM | POA: Diagnosis not present

## 2021-09-17 DIAGNOSIS — M25512 Pain in left shoulder: Secondary | ICD-10-CM

## 2021-09-17 DIAGNOSIS — M25612 Stiffness of left shoulder, not elsewhere classified: Secondary | ICD-10-CM

## 2021-09-17 NOTE — Therapy (Signed)
[place], [place], [address] Phone: [phone]   Fax:  [phone]  Physical Therapy Treatment  Patient Details  Name: Brooke Duncan MRN: [id number] Date of Birth: 1946/08/21 Referring Provider (PT): Dr. Rolm Bookbinder   Encounter Date: 09/17/2021   PT End of Session - 09/17/21 1243     Visit Number 6    Number of Visits 21    Date for PT Re-Evaluation 10/22/21    Authorization - Visit Number 6    Progress Note Due on Visit 10    PT Start Time 0932    PT Stop Time 1010    PT Time Calculation (min) 38 min    Activity Tolerance Patient tolerated treatment well    Behavior During Therapy [facility] for tasks assessed/performed             Past Medical History:  Diagnosis Date   Acquired solitary kidney    right side 2014   Arthritis    Frequency of urination    Heart murmur    per told by pcp approx. 02/ 2022 heard a very faint murmur, told did need work-up done at this time   History of Hashimoto thyroiditis    s/p  total thyroidectomy 1995   History of hypercalcemia    s/p  parathryoidectomy 07/ 2021   History of renal cell carcinoma 2014   s/p  left nephrectomy, pt stated no other treatment   Hypertension    followed by pcp   Hypothyroidism, postsurgical 1995   followed by pcp in [place], moved from [place] 01/ 2021   OSA on CPAP    Thickened endometrium    Wears glasses     Past Surgical History:  Procedure Laterality Date   HYSTEROSCOPY WITH D & C N/A 04/08/2021   Procedure: DILATATION AND CURETTAGE /HYSTEROSCOPY, EXCISION OF VAGINAL LESION;  Surgeon: Delsa Bern, MD;  Location: [place];  Service: Gynecology;  Laterality: N/A;   LAPAROSCOPIC CHOLECYSTECTOMY  2005   NEPHRECTOMY Left 2014   PARATHYROIDECTOMY  07/ 2021  in [place]  age 61   TOTAL HIP ARTHROPLASTY Bilateral left 2015;  right 2009   TOTAL KNEE ARTHROPLASTY Left 2014    TOTAL THYROIDECTOMY Bilateral 1995    There were no vitals filed for this visit.   Subjective Assessment - 09/17/21 1245     Subjective Pt reports that both arm and leg are moving a little better.    Patient Stated Goals Get the Rt knee better. Get arm moving better to tolerate surgery; prevent lymphedema and learn post op HEP    Currently in Pain? Yes    Pain Score 4     Pain Location Shoulder    Pain Orientation Left;Lateral    Pain Descriptors / Indicators Sore    Aggravating Factors  moving arm above shoulder height    Pain Relieving Factors rest    Pain Score 3    Pain Location Leg    Pain Orientation Right    Pain Descriptors / Indicators Sore;Tightness    Aggravating Factors  walking up hills    Pain Relieving Factors rest                [facility] PT Assessment - 09/17/21 0001       Assessment   Medical Diagnosis Left breast cancer; left shoulder pain, Rt knee/LE pain    Referring Provider (PT) Dr. Rolm Bookbinder    Onset Date/Surgical  Date 06/09/21   08/30/21 injury to Rt leg   Hand Dominance Right    Prior Therapy none                [facility] Adult PT Treatment/Exercise - 09/17/21 0001       Knee/Hip Exercises: Stretches   Passive Hamstring Stretch Left;2 reps;20 seconds   supine with strap     Knee/Hip Exercises: Standing   Functional Squat 1 set;5 reps   mini squat at sink   Other Standing Knee Exercises side stepping at counter x 10 ft R/L x 2; backwards walking with hand on counter and cane in hand x 8 ft x 2      Knee/Hip Exercises: Seated   Sit to Sand 10 reps;without UE support   5 on NuStep, 8 on standard chair.     Shoulder Exercises: Seated   External Rotation AAROM;Left;10 reps   cane, 5 sec hold   Abduction AAROM;Left;5 reps   cane; scaption     Shoulder Exercises: Standing   Extension AAROM;Left;10 reps   cane     Shoulder Exercises: Pulleys   Flexion --   10 reps, 5-10 sec hold   Scaption --   10 reps, 5 sec hold each      Shoulder Exercises: ROM/Strengthening   Nustep L4:  arms/legs x 5 min for ROM and warm up.      Shoulder Exercises: Stretch   Wall Stretch - Flexion 2 reps;10 seconds   sliding hand up cabinets.   Star Gazer Stretch 3 reps;10 seconds   RUE assisting L     Manual Therapy   Manual Therapy Soft tissue mobilization;Passive ROM    Soft tissue mobilization STM to Lt biceps brachii, posterior shoulder, pec major    Passive ROM Lt shoulder into scaption, flexion, ER                       PT Short Term Goals - 09/10/21 1020       PT SHORT TERM GOAL #1   Title Patient will be independent in her home exercise program for shoulder ROM.    Time 6    Period Weeks    Status On-going    Target Date 10/22/21      PT SHORT TERM GOAL #2   Title Patient will increase left shoulder flexion to >/= 120 degrees for more functional reach.    Baseline -    Time 6    Period Weeks    Status On-going    Target Date 10/22/21      PT SHORT TERM GOAL #3   Title Patient will increase left shoulder abduction to >/= 120 degrees to more easily obtain surgical position and reach with greater ease.    Baseline -    Time 6    Period Weeks    Status On-going    Target Date 10/22/21      PT SHORT TERM GOAL #4   Title Patient will improve her DASH score to be </= 40 for improved overall UE function.    Baseline -    Time 6    Period Weeks    Status On-going    Target Date 10/22/21      PT SHORT TERM GOAL #5   Title Patient will report >/= 25% decrease in left shoulder pain with daily tasks.    Time 6    Period Weeks    Status On-going    Target  Date 10/22/21               PT Long Term Goals - 09/10/21 1408       PT LONG TERM GOAL #1   Title Patient will demonstrate she has regained left shoulder function post operatively when compared to baselines.    Time 8    Period Weeks    Status On-going    Target Date 10/22/21      PT LONG TERM GOAL #2   Title Decrease pain Rt LE by  50-75% allowing patient to stand, walk, move in/out of transitional movements with improved safety and decreased pain    Time 6    Period Weeks    Status New    Target Date 10/22/21      PT LONG TERM GOAL #3   Title Improve tissue extensibility Rt hamstrings and calf with patient to demonstrate ~ 80 deg hamstring ROM in supine    Time 6    Period Weeks    Status New    Target Date 10/22/21      PT LONG TERM GOAL #4   Title Patient demonstrates improved gait pattern with rollator with decreased antalgic gait pattern and greater safety/independence    Time 6    Period Weeks    Status New    Target Date 10/22/21      PT LONG TERM GOAL #5   Title Independent in HEP for LE's as indicated    Time 6    Period Weeks    Status New    Target Date 10/22/21                   Plan - 09/17/21 1004     Clinical Impression Statement Improved exercise tolerance; able to complete more exercises in less time.  She was able to demo good control of sit to stand without UE.  Lt shoulder point tender in pec, bicep, infraspinatus, and teres minor with manual therapy.  Progressing well towards goals.    Stability/Clinical Decision Making Stable/Uncomplicated    Rehab Potential Good    PT Frequency 2x / week    PT Duration 6 weeks    PT Treatment/Interventions ADLs/Self Care Home Management;Therapeutic exercise;Therapeutic activities;Patient/family education;Manual techniques;Passive range of motion    PT Next Visit Plan consolidate HEP; manual work vs DN Rt hamstring/calf; continue PROM left shoulder and AAROM exercises    PT Home Exercise Plan Post op HEP; HEP    Consulted and Agree with Plan of Care Patient             Patient will benefit from skilled therapeutic intervention in order to improve the following deficits and impairments:  Decreased knowledge of precautions, Impaired UE functional use, Pain, Postural dysfunction, Decreased range of motion, Abnormal gait, Decreased  activity tolerance, Impaired flexibility, Decreased mobility, Difficulty walking  Visit Diagnosis: Chronic left shoulder pain  Stiffness of left shoulder, not elsewhere classified  Pain in right leg  Abnormal posture     Problem List Patient Active Problem List   Diagnosis Date Noted   Malignant neoplasm of upper-outer quadrant of left breast in female, estrogen receptor positive ([place]) 08/27/2021   Abnormal ultrasound of endometrium 04/08/2021   Kerin Perna, PTA 09/17/21 12:49 PM   [place], [place], [address] Phone: [phone]   Fax:  [phone]  Name: Acelyn Basham MRN: [id number] Date of Birth: Dec 21, 1945

## 2021-09-20 ENCOUNTER — Encounter: Payer: Self-pay | Admitting: General Practice

## 2021-09-20 NOTE — Progress Notes (Signed)
[place] Spiritual Care Note  Phoned [name] to check in about progress toward clarity about treatment plan in order to make a fitting Manufacturing engineer. She is waiting for imaging scheduler to return her call for follow-up biopsy scheduling.  Meanwhile, she is the primary caregiver for her husband with dementia, and she is worried about his care while she is having and recovering from surgery. Provided her with two community resources through which to pursue these questions:  [employer] 24/7 Helpline: [phone], [phone]  Sharen Counter, Family Caregiver Support Specialist, [facility] [phone], [phone]  [name] and I plan to follow up by phone in ca two weeks to check on treatment plan progress. She plans to phone if other referrals needed.   [place], [place], [facility] Pager [phone] Voicemail [phone]

## 2021-09-21 ENCOUNTER — Encounter: Payer: Self-pay | Admitting: *Deleted

## 2021-09-21 ENCOUNTER — Ambulatory Visit (INDEPENDENT_AMBULATORY_CARE_PROVIDER_SITE_OTHER): Payer: Medicare Other | Admitting: Physical Therapy

## 2021-09-21 ENCOUNTER — Other Ambulatory Visit: Payer: Self-pay

## 2021-09-21 DIAGNOSIS — M79604 Pain in right leg: Secondary | ICD-10-CM | POA: Diagnosis not present

## 2021-09-21 DIAGNOSIS — M25612 Stiffness of left shoulder, not elsewhere classified: Secondary | ICD-10-CM | POA: Diagnosis not present

## 2021-09-21 DIAGNOSIS — M25512 Pain in left shoulder: Secondary | ICD-10-CM

## 2021-09-21 DIAGNOSIS — R293 Abnormal posture: Secondary | ICD-10-CM

## 2021-09-21 DIAGNOSIS — G8929 Other chronic pain: Secondary | ICD-10-CM | POA: Diagnosis not present

## 2021-09-21 NOTE — Therapy (Signed)
[place], [place], [address] Phone: [phone]   Fax:  [phone]  Physical Therapy Treatment  Patient Details  Name: Brooke Duncan MRN: [id number] Date of Birth: 07/22/1946 Referring Provider (PT): Dr. Rolm Bookbinder   Encounter Date: 09/21/2021   PT End of Session - 09/21/21 1026     Visit Number 7    Number of Visits 21    Date for PT Re-Evaluation 10/22/21    Authorization - Visit Number 7    Progress Note Due on Visit 10    PT Start Time 1019    PT Stop Time 1100    PT Time Calculation (min) 41 min    Activity Tolerance Patient tolerated treatment well    Behavior During Therapy [facility] for tasks assessed/performed             Past Medical History:  Diagnosis Date   Acquired solitary kidney    right side 2014   Arthritis    Frequency of urination    Heart murmur    per told by pcp approx. 02/ 2022 heard a very faint murmur, told did need work-up done at this time   History of Hashimoto thyroiditis    s/p  total thyroidectomy 1995   History of hypercalcemia    s/p  parathryoidectomy 07/ 2021   History of renal cell carcinoma 2014   s/p  left nephrectomy, pt stated no other treatment   Hypertension    followed by pcp   Hypothyroidism, postsurgical 1995   followed by pcp in [place], moved from [place] 01/ 2021   OSA on CPAP    Thickened endometrium    Wears glasses     Past Surgical History:  Procedure Laterality Date   HYSTEROSCOPY WITH D & C N/A 04/08/2021   Procedure: DILATATION AND CURETTAGE /HYSTEROSCOPY, EXCISION OF VAGINAL LESION;  Surgeon: Delsa Bern, MD;  Location: [facility];  Service: Gynecology;  Laterality: N/A;   LAPAROSCOPIC CHOLECYSTECTOMY  2005   NEPHRECTOMY Left 2014   PARATHYROIDECTOMY  07/ 2021  in [place]  age 62   TOTAL HIP ARTHROPLASTY Bilateral left 2015;  right 2009   TOTAL KNEE ARTHROPLASTY Left 2014    TOTAL THYROIDECTOMY Bilateral 1995    There were no vitals filed for this visit.   Subjective Assessment - 09/21/21 1027     Subjective Pt reports she feels maybe she has plateued.  She continues to have difficulty reaching shelf shoulder height or blow drying hair.  she reports she is using her Lt arm more, so she feels like she is getting more strength in it.    Patient Stated Goals Get the Rt knee better. Get arm moving better to tolerate surgery; prevent lymphedema and learn post op HEP    Currently in Pain? Yes    Pain Score 4     Pain Location Shoulder    Pain Orientation Left    Pain Descriptors / Indicators Sore    Aggravating Factors  see above    Pain Relieving Factors rest.                [facility] PT Assessment - 09/21/21 0001       Assessment   Medical Diagnosis Left breast cancer; left shoulder pain, Rt knee/LE pain    Referring Provider (PT) Dr. Rolm Bookbinder    Onset Date/Surgical Date 06/09/21   08/30/21 injury to Rt leg   Hand Dominance Right  Prior Therapy none      PROM   Left Shoulder External Rotation 32 Degrees   supported supine             Quick Dash - 09/21/21 0001     Open a tight or new jar Severe difficulty    Do heavy household chores (wash walls, wash floors) Severe difficulty    Carry a shopping bag or briefcase Moderate difficulty    Wash your back Unable    Use a knife to cut food Moderate difficulty    Recreational activities in which you take some force or impact through your arm, shoulder, or hand (golf, hammering, tennis) Moderate difficulty    During the past week, to what extent has your arm, shoulder or hand problem interfered with your normal social activities with family, friends, neighbors, or groups? Modererately    During the past week, to what extent has your arm, shoulder or hand problem limited your work or other regular daily activities Quite a bit    Arm, shoulder, or hand pain. Moderate    Tingling (pins and  needles) in your arm, shoulder, or hand Mild    Difficulty Sleeping Moderate difficulty    DASH Score 59.09 %                    [facility] Adult PT Treatment/Exercise - 09/21/21 0001       Knee/Hip Exercises: Stretches   Passive Hamstring Stretch 3 reps;30 seconds;Right    ITB Stretch 2 reps;10 seconds;Right    Gastroc Stretch Right;2 reps;20 seconds      Knee/Hip Exercises: Seated   Sit to Sand 1 set;5 reps;without UE support   controlled descent     Shoulder Exercises: Supine   External Rotation AAROM;Left;10 reps    Flexion Both;AAROM x 5 reps (painful at available end range)   ABduction AAROM;Left;10 reps      Shoulder Exercises: Pulleys   Flexion 2 minutes    Scaption 2 minutes    Scaption Limitations limited tolerance and range      Shoulder Exercises: ROM/Strengthening   Nustep L5: arms/legs x 5 min at 60-70 SPM      Shoulder Exercises: Stretch   Star Gazer Stretch 3 reps;10 seconds      Manual Therapy   Soft tissue mobilization STM/ TPR to Lt infraspinatus. STM to [place], ant shoulder to decrease fascial restrictions and improve ROM.                       PT Short Term Goals - 09/21/21 1038       PT SHORT TERM GOAL #1   Title Patient will be independent in her home exercise program for shoulder ROM.    Time 6    Period Weeks    Status On-going    Target Date 10/22/21      PT SHORT TERM GOAL #2   Title Patient will increase left shoulder flexion to >/= 120 degrees for more functional reach.    Baseline -    Time 6    Period Weeks    Status On-going    Target Date 10/22/21      PT SHORT TERM GOAL #3   Title Patient will increase left shoulder abduction to >/= 120 degrees to more easily obtain surgical position and reach with greater ease.    Baseline -    Time 6    Period Weeks    Status On-going  Target Date 10/22/21      PT SHORT TERM GOAL #4   Title Patient will improve her DASH score to be </= 40 for improved overall UE  function.    Baseline -    Time 6    Period Weeks    Status On-going    Target Date 10/22/21      PT SHORT TERM GOAL #5   Title Patient will report >/= 25% decrease in left shoulder pain with daily tasks.    Time 6    Period Weeks    Status On-going    Target Date 10/22/21               PT Long Term Goals - 09/21/21 1036       PT LONG TERM GOAL #1   Title Patient will demonstrate she has regained left shoulder function post operatively when compared to baselines.    Time 8    Period Weeks    Status On-going      PT LONG TERM GOAL #2   Title Decrease pain Rt LE by 50-75% allowing patient to stand, walk, move in/out of transitional movements with improved safety and decreased pain    Baseline 50% improvement: 09/21/21.    Time 6    Period Weeks    Status Achieved      PT LONG TERM GOAL #3   Title Improve tissue extensibility Rt hamstrings and calf with patient to demonstrate ~ 80 deg hamstring ROM in supine    Time 6    Period Weeks    Status On-going      PT LONG TERM GOAL #4   Title Patient demonstrates improved gait pattern with rollator with decreased antalgic gait pattern and greater safety/independence    Time 6    Period Weeks    Status On-going      PT LONG TERM GOAL #5   Title Independent in HEP for LE's as indicated    Time 6    Period Weeks    Status On-going                   Plan - 09/21/21 1306     Clinical Impression Statement Pt observed walking with much faster pace into/out of clinic, with less antalgic gait.  Pt's RLE pain has decreased by 50%.  Lt shoulder remains tight and tender, with limited ROM, especially into flexion.  She reports pain in Lt shoulder up to 9/10 with pulleys and supine cane exercise; pain reduced with rest. Her DASH score is worse than at intake, likely due to increased awareness of deficits since starting therapy.  Pt making progress towards STG/LTGs.    Stability/Clinical Decision Making  Stable/Uncomplicated    Rehab Potential Good    PT Frequency 2x / week    PT Duration 6 weeks    PT Treatment/Interventions ADLs/Self Care Home Management;Therapeutic exercise;Therapeutic activities;Patient/family education;Manual techniques;Passive range of motion    PT Next Visit Plan consolidate HEP for LE and shoulder; manual work /PROM to left shoulder.  Continue AAROM exercises and begin LE strengthening to assist for prep of getting on/off MRI table.   Consulted and Agree with Plan of Care Patient             Patient will benefit from skilled therapeutic intervention in order to improve the following deficits and impairments:  Decreased knowledge of precautions, Impaired UE functional use, Pain, Postural dysfunction, Decreased range of motion, Abnormal gait, Decreased activity tolerance, Impaired  flexibility, Decreased mobility, Difficulty walking  Visit Diagnosis: Chronic left shoulder pain  Stiffness of left shoulder, not elsewhere classified  Pain in right leg  Abnormal posture     Problem List Patient Active Problem List   Diagnosis Date Noted   Malignant neoplasm of upper-outer quadrant of left breast in female, estrogen receptor positive ([place]) 08/27/2021   Abnormal ultrasound of endometrium 04/08/2021   Kerin Perna, PTA 09/21/21 1:11 PM  [place], [place], [address] Phone: [phone]   Fax:  [phone]  Name: Cathy Crounse MRN: [id number] Date of Birth: 01-18-1946

## 2021-09-23 ENCOUNTER — Encounter: Payer: Self-pay | Admitting: Rehabilitative and Restorative Service Providers"

## 2021-09-23 ENCOUNTER — Other Ambulatory Visit: Payer: Self-pay

## 2021-09-23 ENCOUNTER — Ambulatory Visit (INDEPENDENT_AMBULATORY_CARE_PROVIDER_SITE_OTHER): Payer: Medicare Other | Admitting: Rehabilitative and Restorative Service Providers"

## 2021-09-23 DIAGNOSIS — R29898 Other symptoms and signs involving the musculoskeletal system: Secondary | ICD-10-CM

## 2021-09-23 DIAGNOSIS — M79604 Pain in right leg: Secondary | ICD-10-CM | POA: Diagnosis not present

## 2021-09-23 DIAGNOSIS — R293 Abnormal posture: Secondary | ICD-10-CM

## 2021-09-23 DIAGNOSIS — G8929 Other chronic pain: Secondary | ICD-10-CM | POA: Diagnosis not present

## 2021-09-23 DIAGNOSIS — M25512 Pain in left shoulder: Secondary | ICD-10-CM | POA: Diagnosis not present

## 2021-09-23 DIAGNOSIS — M25612 Stiffness of left shoulder, not elsewhere classified: Secondary | ICD-10-CM | POA: Diagnosis not present

## 2021-09-23 NOTE — Therapy (Signed)
[place], [place], [address] Phone: [phone]   Fax:  [phone]  Physical Therapy Treatment  Patient Details  Name: Brooke Duncan MRN: [id number] Date of Birth: 09-16-1946 Referring Provider (PT): Dr. Rolm Bookbinder   Encounter Date: 09/23/2021   PT End of Session - 09/23/21 1536     Visit Number 8    Number of Visits 21    Date for PT Re-Evaluation 10/22/21    Authorization - Visit Number 8    Progress Note Due on Visit 10    PT Start Time 7989    PT Stop Time 1615    PT Time Calculation (min) 42 min    Activity Tolerance Patient tolerated treatment well             Past Medical History:  Diagnosis Date   Acquired solitary kidney    right side 2014   Arthritis    Frequency of urination    Heart murmur    per told by pcp approx. 02/ 2022 heard a very faint murmur, told did need work-up done at this time   History of Hashimoto thyroiditis    s/p  total thyroidectomy 1995   History of hypercalcemia    s/p  parathryoidectomy 07/ 2021   History of renal cell carcinoma 2014   s/p  left nephrectomy, pt stated no other treatment   Hypertension    followed by pcp   Hypothyroidism, postsurgical 1995   followed by pcp in [place], moved from [place] 01/ 2021   OSA on CPAP    Thickened endometrium    Wears glasses     Past Surgical History:  Procedure Laterality Date   HYSTEROSCOPY WITH D & C N/A 04/08/2021   Procedure: DILATATION AND CURETTAGE /HYSTEROSCOPY, EXCISION OF VAGINAL LESION;  Surgeon: Delsa Bern, MD;  Location: [facility];  Service: Gynecology;  Laterality: N/A;   LAPAROSCOPIC CHOLECYSTECTOMY  2005   NEPHRECTOMY Left 2014   PARATHYROIDECTOMY  07/ 2021  in [place]  age 75   TOTAL HIP ARTHROPLASTY Bilateral left 2015;  right 2009   TOTAL KNEE ARTHROPLASTY Left 2014   TOTAL THYROIDECTOMY Bilateral 1995    There were no vitals  filed for this visit.   Subjective Assessment - 09/23/21 1537     Subjective Patient reports that she is "coming along". Working on exercises at home for leg and for arm.    Currently in Pain? Yes    Pain Score 5     Pain Location Shoulder    Pain Orientation Left    Pain Descriptors / Indicators Sore    Pain Type Acute pain;Chronic pain    Pain Onset More than a month ago    Pain Frequency Intermittent    Pain Score 4    Pain Location Leg    Pain Orientation Right    Pain Descriptors / Indicators Sore;Tightness                               [facility] Adult PT Treatment/Exercise - 09/23/21 0001       Knee/Hip Exercises: Stretches   Passive Hamstring Stretch 3 reps;30 seconds;Right    Passive Hamstring Stretch Limitations seated trial of adducting LE slightly to stretch laterally through HS and ITB more    ITB Stretch 2 reps;10 seconds;Right    Gastroc Stretch Right;2 reps;20 seconds  Shoulder Exercises: Seated   Extension Strengthening;Both;20 reps;Theraband    Theraband Level (Shoulder Extension) Level 2 (Red)    Retraction Strengthening;Both;10 reps;Theraband    Theraband Level (Shoulder Retraction) Level 1 (Yellow)    Row Strengthening;Both;20 reps;Theraband    Theraband Level (Shoulder Row) Level 2 (Red)    Abduction AAROM;Left;Right;10 reps   touching top of head     Shoulder Exercises: Pulleys   Flexion 2 minutes    Scaption 2 minutes    Other Pulley Exercises horizontal ab/add at ~ 90 deg bilat x 20-25 reps      Shoulder Exercises: Stretch   Other Shoulder Stretches prolonged snow angel ~ 2-3 min bending elbow to release stretch as needed      Manual Therapy   Soft tissue mobilization STM Lt shoulder girdle pecs; upper trap; leveator; biceps; triceps; deltoid; teres    Passive ROM Lt shoulder flexion; scaption; ER in scapular plane                     PT Education - 09/23/21 1558     Education Details HEP    Person(s)  Educated Patient    Methods Explanation;Demonstration;Tactile cues;Verbal cues;Handout    Comprehension Verbalized understanding;Returned demonstration;Verbal cues required;Tactile cues required              PT Short Term Goals - 09/21/21 1038       PT SHORT TERM GOAL #1   Title Patient will be independent in her home exercise program for shoulder ROM.    Time 6    Period Weeks    Status On-going    Target Date 10/22/21      PT SHORT TERM GOAL #2   Title Patient will increase left shoulder flexion to >/= 120 degrees for more functional reach.    Baseline -    Time 6    Period Weeks    Status On-going    Target Date 10/22/21      PT SHORT TERM GOAL #3   Title Patient will increase left shoulder abduction to >/= 120 degrees to more easily obtain surgical position and reach with greater ease.    Baseline -    Time 6    Period Weeks    Status On-going    Target Date 10/22/21      PT SHORT TERM GOAL #4   Title Patient will improve her DASH score to be </= 40 for improved overall UE function.    Baseline -    Time 6    Period Weeks    Status On-going    Target Date 10/22/21      PT SHORT TERM GOAL #5   Title Patient will report >/= 25% decrease in left shoulder pain with daily tasks.    Time 6    Period Weeks    Status On-going    Target Date 10/22/21               PT Long Term Goals - 09/21/21 1036       PT LONG TERM GOAL #1   Title Patient will demonstrate she has regained left shoulder function post operatively when compared to baselines.    Time 8    Period Weeks    Status On-going      PT LONG TERM GOAL #2   Title Decrease pain Rt LE by 50-75% allowing patient to stand, walk, move in/out of transitional movements with improved safety and decreased pain  Baseline 50% improvement: 09/21/21.    Time 6    Period Weeks    Status Achieved      PT LONG TERM GOAL #3   Title Improve tissue extensibility Rt hamstrings and calf with patient to  demonstrate ~ 80 deg hamstring ROM in supine    Time 6    Period Weeks    Status On-going      PT LONG TERM GOAL #4   Title Patient demonstrates improved gait pattern with rollator with decreased antalgic gait pattern and greater safety/independence    Time 6    Period Weeks    Status On-going      PT LONG TERM GOAL #5   Title Independent in HEP for LE's as indicated    Time 6    Period Weeks    Status On-going                   Plan - 09/23/21 1542     Clinical Impression Statement Continued gradual progress with shoulder and LE rehab. Added resistance for UE TB exercises and added exercises. 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    Rehab Potential Good    PT Frequency 2x / week    PT Duration 6 weeks    PT Treatment/Interventions ADLs/Self Care Home Management;Therapeutic exercise;Therapeutic activities;Patient/family education;Manual techniques;Passive range of motion    PT Next Visit Plan consolidate HEP; manual work to [employer]; continue PROM left shoulder and AAROM exercises    PT Home Exercise Plan Post op HEP; HEP, [email or web address]    Consulted and Agree with Plan of Care Patient             Patient will benefit from skilled therapeutic intervention in order to improve the following deficits and impairments:     Visit Diagnosis: Chronic left shoulder pain  Stiffness of left shoulder, not elsewhere classified  Pain in right leg  Abnormal posture  Other symptoms and signs involving the musculoskeletal system     Problem List Patient Active Problem List   Diagnosis Date Noted   Malignant neoplasm of upper-outer quadrant of left breast in female, estrogen receptor positive ([facility]) 08/27/2021   Abnormal ultrasound of endometrium 04/08/2021    Tykisha Areola Nilda Simmer, PT MPH  09/23/2021, [place], [place], [address] Phone: [phone]   Fax:   [phone]  Name: Brooke Duncan MRN: [id number] Date of Birth: 11-24-1946

## 2021-09-23 NOTE — Patient Instructions (Signed)
Access Code: [email or web address] URL: [email or web address] Date: 09/23/2021 Prepared by: Gillermo Murdoch  Exercises Shoulder External Rotation and Scapular Retraction with Resistance - 2 x daily - 7 x weekly - 3 sets - 10 reps Scapular Retraction with Resistance - 2 x daily - 7 x weekly - 3 sets - 10 reps Scapular Retraction with Resistance Advanced - 2 x daily - 7 x weekly - 3 sets - 10 reps

## 2021-09-24 ENCOUNTER — Encounter: Payer: Self-pay | Admitting: Hematology and Oncology

## 2021-09-28 ENCOUNTER — Other Ambulatory Visit: Payer: Self-pay

## 2021-09-28 ENCOUNTER — Encounter: Payer: Self-pay | Admitting: Physical Therapy

## 2021-09-28 ENCOUNTER — Ambulatory Visit (INDEPENDENT_AMBULATORY_CARE_PROVIDER_SITE_OTHER): Payer: Medicare Other | Admitting: Physical Therapy

## 2021-09-28 DIAGNOSIS — M25612 Stiffness of left shoulder, not elsewhere classified: Secondary | ICD-10-CM

## 2021-09-28 DIAGNOSIS — M79604 Pain in right leg: Secondary | ICD-10-CM | POA: Diagnosis not present

## 2021-09-28 DIAGNOSIS — G8929 Other chronic pain: Secondary | ICD-10-CM | POA: Diagnosis not present

## 2021-09-28 DIAGNOSIS — R29898 Other symptoms and signs involving the musculoskeletal system: Secondary | ICD-10-CM | POA: Diagnosis not present

## 2021-09-28 DIAGNOSIS — R293 Abnormal posture: Secondary | ICD-10-CM | POA: Diagnosis not present

## 2021-09-28 DIAGNOSIS — M25512 Pain in left shoulder: Secondary | ICD-10-CM | POA: Diagnosis present

## 2021-09-28 NOTE — Therapy (Signed)
[place], [place], [address] Phone: [phone]   Fax:  [phone]  Physical Therapy Treatment  Patient Details  Name: Brooke Duncan MRN: [id number] Date of Birth: 1946-06-20 Referring Provider (PT): Dr. Rolm Bookbinder   Encounter Date: 09/28/2021   PT End of Session - 09/28/21 0940     Visit Number 9    Number of Visits 21    Date for PT Re-Evaluation 10/22/21    Authorization - Visit Number 9    Progress Note Due on Visit 10    PT Start Time 0932    PT Stop Time 1015    PT Time Calculation (min) 43 min    Activity Tolerance Patient tolerated treatment well             Past Medical History:  Diagnosis Date   Acquired solitary kidney    right side 2014   Arthritis    Frequency of urination    Heart murmur    per told by pcp approx. 02/ 2022 heard a very faint murmur, told did need work-up done at this time   History of Hashimoto thyroiditis    s/p  total thyroidectomy 1995   History of hypercalcemia    s/p  parathryoidectomy 07/ 2021   History of renal cell carcinoma 2014   s/p  left nephrectomy, pt stated no other treatment   Hypertension    followed by pcp   Hypothyroidism, postsurgical 1995   followed by pcp in [place], moved from [place] 01/ 2021   OSA on CPAP    Thickened endometrium    Wears glasses     Past Surgical History:  Procedure Laterality Date   HYSTEROSCOPY WITH D & C N/A 04/08/2021   Procedure: DILATATION AND CURETTAGE /HYSTEROSCOPY, EXCISION OF VAGINAL LESION;  Surgeon: Delsa Bern, MD;  Location: [facility];  Service: Gynecology;  Laterality: N/A;   LAPAROSCOPIC CHOLECYSTECTOMY  2005   NEPHRECTOMY Left 2014   PARATHYROIDECTOMY  07/ 2021  in [place]  age 52   TOTAL HIP ARTHROPLASTY Bilateral left 2015;  right 2009   TOTAL KNEE ARTHROPLASTY Left 2014   TOTAL THYROIDECTOMY Bilateral 1995    There were no vitals  filed for this visit.   Subjective Assessment - 09/28/21 0938     Subjective Pt reporting soreness in Rt knee to ankle, at end of day; "tender".   She plans to buy pulley for home.    Patient Stated Goals Get the Rt knee better. Get arm moving better to tolerate surgery; prevent lymphedema and learn post op HEP    Currently in Pain? Yes    Pain Score 3     Pain Location Shoulder    Pain Orientation Left    Pain Descriptors / Indicators Sore    Aggravating Factors  reaching    Pain Relieving Factors rest    Pain Score 3    Pain Location Leg    Pain Orientation Right    Pain Descriptors / Indicators Tender;Tightness;Sore    Aggravating Factors  walking    Pain Relieving Factors rest                [facility] PT Assessment - 09/28/21 0001       Assessment   Medical Diagnosis Left breast cancer; left shoulder pain, Rt knee/LE pain    Referring Provider (PT) Dr. Rolm Bookbinder    Onset Date/Surgical Date 06/09/21   08/30/21 injury  to Rt leg   Hand Dominance Right    Prior Therapy none      AROM   Overall AROM Comments measurements taken in standing    Left Shoulder Extension 40 Degrees    Left Shoulder Flexion 120 Degrees    Left Shoulder ABduction 109 Degrees      PROM   Left Shoulder Flexion 130 Degrees   AAROM standing, wall ladder             [facility] Adult PT Treatment/Exercise - 09/28/21 0001       Self-Care   Other Self-Care Comments  educated pt on self massage with roller stick / cane to posterior Rt LE to decrease tightness and pain; pt verbalized understanding.      Knee/Hip Exercises: Stretches   Passive Hamstring Stretch Right;2 reps;30 seconds      Knee/Hip Exercises: Standing   Other Standing Knee Exercises attempts at quadruped on low mat table x 3- pt able to WB through BUE and Rt knee; unable to kneel on Lt knee due to increased pain      Shoulder Exercises: Seated   Extension Both;Strengthening;12 reps    Theraband Level (Shoulder Extension)  Level 2 (Red)    Row Strengthening;Both;12 reps    Theraband Level (Shoulder Row) Level 2 (Red)    External Rotation AAROM;Left;10 reps   cane   External Rotation Limitations and bilat ER x 12 reps with yellow band    Abduction Left;AAROM   cane x 3 reps; painful and difficult   Other Seated Exercises sit to sidelying with intention to move to prone - unable to move to prone position, remained in sidelying.      Shoulder Exercises: Standing   Extension AAROM;Left;10 reps   cane     Shoulder Exercises: Pulleys   Flexion 3 minutes    ABduction 2 minutes      Shoulder Exercises: ROM/Strengthening   Nustep L5: arms/legs x 6 min at 60-70 SPM                       PT Short Term Goals - 09/21/21 1038       PT SHORT TERM GOAL #1   Title Patient will be independent in her home exercise program for shoulder ROM.    Time 6    Period Weeks    Status On-going    Target Date 10/22/21      PT SHORT TERM GOAL #2   Title Patient will increase left shoulder flexion to >/= 120 degrees for more functional reach.    Baseline -    Time 6    Period Weeks    Status On-going    Target Date 10/22/21      PT SHORT TERM GOAL #3   Title Patient will increase left shoulder abduction to >/= 120 degrees to more easily obtain surgical position and reach with greater ease.    Baseline -    Time 6    Period Weeks    Status On-going    Target Date 10/22/21      PT SHORT TERM GOAL #4   Title Patient will improve her DASH score to be </= 40 for improved overall UE function.    Baseline -    Time 6    Period Weeks    Status On-going    Target Date 10/22/21      PT SHORT TERM GOAL #5   Title Patient will report >/= 25%  decrease in left shoulder pain with daily tasks.    Time 6    Period Weeks    Status On-going    Target Date 10/22/21               PT Long Term Goals - 09/21/21 1036       PT LONG TERM GOAL #1   Title Patient will demonstrate she has regained left shoulder  function post operatively when compared to baselines.    Time 8    Period Weeks    Status On-going      PT LONG TERM GOAL #2   Title Decrease pain Rt LE by 50-75% allowing patient to stand, walk, move in/out of transitional movements with improved safety and decreased pain    Baseline 50% improvement: 09/21/21.    Time 6    Period Weeks    Status Achieved      PT LONG TERM GOAL #3   Title Improve tissue extensibility Rt hamstrings and calf with patient to demonstrate ~ 80 deg hamstring ROM in supine    Time 6    Period Weeks    Status On-going      PT LONG TERM GOAL #4   Title Patient demonstrates improved gait pattern with rollator with decreased antalgic gait pattern and greater safety/independence    Time 6    Period Weeks    Status On-going      PT LONG TERM GOAL #5   Title Independent in HEP for LE's as indicated    Time 6    Period Weeks    Status On-going                   Plan - 09/28/21 1021     Clinical Impression Statement Audible popping/crepitus with Lt shoulder AAROM exercises today.  Attempted quaduped position today, but pt unable to tolerate WB through Lt knee on mat table.  Began problem solving for mobility needed to get on/off MRI table for upcoming appt in Nov. Encouraged pt to trial quadruped position to walking down to prone position in her bed where surface is soft.  Lt shoulder ROM gradually improving.    Rehab Potential Good    PT Frequency 2x / week    PT Duration 6 weeks    PT Treatment/Interventions ADLs/Self Care Home Management;Therapeutic exercise;Therapeutic activities;Patient/family education;Manual techniques;Passive range of motion    PT Next Visit Plan continue to consolidate HEP; continue PROM left shoulder and AAROM exercises.  10th visit note.    PT Home Exercise Plan HEP: [email or web address]    Consulted and Agree with Plan of Care Patient             Patient will benefit from skilled therapeutic intervention in order to improve  the following deficits and impairments:  Decreased knowledge of precautions, Impaired UE functional use, Pain, Postural dysfunction, Decreased range of motion, Abnormal gait, Decreased activity tolerance, Impaired flexibility, Decreased mobility, Difficulty walking  Visit Diagnosis: Chronic left shoulder pain  Stiffness of left shoulder, not elsewhere classified  Pain in right leg  Abnormal posture  Other symptoms and signs involving the musculoskeletal system     Problem List Patient Active Problem List   Diagnosis Date Noted   Malignant neoplasm of upper-outer quadrant of left breast in female, estrogen receptor positive ([place]) 08/27/2021   Abnormal ultrasound of endometrium 04/08/2021   Kerin Perna, PTA 09/28/21 1:02 PM  [place], [place],  [place] Phone: [phone]   Fax:  [phone]  Name: Marlisha Vanwyk MRN: [id number] Date of Birth: Mar 04, 1946

## 2021-09-29 ENCOUNTER — Ambulatory Visit (HOSPITAL_COMMUNITY): Payer: Medicare Other | Admitting: Licensed Clinical Social Worker

## 2021-09-29 NOTE — Progress Notes (Signed)
Therapist contacted patient for session and she did not respond. Session is a no show

## 2021-09-30 ENCOUNTER — Encounter: Payer: Medicare Other | Admitting: Physical Therapy

## 2021-10-01 ENCOUNTER — Ambulatory Visit (INDEPENDENT_AMBULATORY_CARE_PROVIDER_SITE_OTHER): Payer: Medicare Other | Admitting: Physical Therapy

## 2021-10-01 ENCOUNTER — Encounter: Payer: Self-pay | Admitting: Physical Therapy

## 2021-10-01 ENCOUNTER — Other Ambulatory Visit: Payer: Self-pay

## 2021-10-01 DIAGNOSIS — R293 Abnormal posture: Secondary | ICD-10-CM | POA: Diagnosis not present

## 2021-10-01 DIAGNOSIS — M79604 Pain in right leg: Secondary | ICD-10-CM

## 2021-10-01 DIAGNOSIS — M25612 Stiffness of left shoulder, not elsewhere classified: Secondary | ICD-10-CM | POA: Diagnosis not present

## 2021-10-01 DIAGNOSIS — G8929 Other chronic pain: Secondary | ICD-10-CM

## 2021-10-01 DIAGNOSIS — M25512 Pain in left shoulder: Secondary | ICD-10-CM

## 2021-10-01 NOTE — Therapy (Signed)
[place], [place], [address] Phone: [phone]   Fax:  [phone]  Physical Therapy Treatment and Medicare 10th Visit Note   Patient Details  Name: Brooke Duncan MRN: [id number] Date of Birth: 11-05-1946 Referring Provider (PT): Dr. Rolm Bookbinder   Encounter Date: 10/01/2021   PT End of Session - 10/01/21 0853     Visit Number 10    Number of Visits 21    Date for PT Re-Evaluation 10/22/21    Authorization - Visit Number 10    Progress Note Due on Visit 10    PT Start Time 0850    PT Stop Time 0930    PT Time Calculation (min) 40 min    Activity Tolerance Patient tolerated treatment well    Behavior During Therapy [facility] for tasks assessed/performed             Past Medical History:  Diagnosis Date   Acquired solitary kidney    right side 2014   Arthritis    Frequency of urination    Heart murmur    per told by pcp approx. 02/ 2022 heard a very faint murmur, told did need work-up done at this time   History of Hashimoto thyroiditis    s/p  total thyroidectomy 1995   History of hypercalcemia    s/p  parathryoidectomy 07/ 2021   History of renal cell carcinoma 2014   s/p  left nephrectomy, pt stated no other treatment   Hypertension    followed by pcp   Hypothyroidism, postsurgical 1995   followed by pcp in [place], moved from [place] 01/ 2021   OSA on CPAP    Thickened endometrium    Wears glasses     Past Surgical History:  Procedure Laterality Date   HYSTEROSCOPY WITH D & C N/A 04/08/2021   Procedure: DILATATION AND CURETTAGE /HYSTEROSCOPY, EXCISION OF VAGINAL LESION;  Surgeon: Delsa Bern, MD;  Location: [facility];  Service: Gynecology;  Laterality: N/A;   LAPAROSCOPIC CHOLECYSTECTOMY  2005   NEPHRECTOMY Left 2014   PARATHYROIDECTOMY  07/ 2021  in [place]  age 50   TOTAL HIP ARTHROPLASTY Bilateral left 2015;  right 2009   TOTAL  KNEE ARTHROPLASTY Left 2014   TOTAL THYROIDECTOMY Bilateral 1995    There were no vitals filed for this visit.   Subjective Assessment - 10/01/21 0854     Subjective Pt reports she has ordered pulleys, but they haven't arrived yet.  continues to have pain in Lt shoulder at rest, and it disturbs her sleep.  She also feels her gait is not back to normal yet (compared to prior to MRI incident with LE).    Patient Stated Goals Get the Rt knee better. Get arm moving better to tolerate surgery; prevent lymphedema and learn post op HEP    Currently in Pain? Yes    Pain Score 3     Pain Location Shoulder    Pain Orientation Left    Pain Descriptors / Indicators Sore    Aggravating Factors  reaching    Pain Relieving Factors rest.                [facility] PT Assessment - 10/01/21 0001       Assessment   Medical Diagnosis Left breast cancer; left shoulder pain, Rt knee/LE pain    Referring Provider (PT) Dr. Rolm Bookbinder    Onset Date/Surgical Date 06/09/21   08/30/21 injury  to Rt leg   Hand Dominance Right    Prior Therapy none      AROM   Left Shoulder Extension 40 Degrees    Left Shoulder Flexion 120 Degrees    Left Shoulder ABduction 109 Degrees      PROM   Left Shoulder Flexion 135 Degrees   supine with cane AAROM   Left Shoulder External Rotation 32 Degrees   supported supine, AAROM with cane              [facility] Adult PT Treatment/Exercise - 10/01/21 0001       Knee/Hip Exercises: Stretches   Passive Hamstring Stretch Right;2 reps;20 seconds    Hip Flexor Stretch Right;2 reps;Left;1 rep;20 seconds      Knee/Hip Exercises: Standing   Stairs Limited ability to ascend / descend 4 and 6" steps with RLE.  Able to complete step to pattern with LLE with 4 and 6" steps wiht Bilat rail without difficulty.      Knee/Hip Exercises: Seated   Sit to Sand 10 reps;without UE support   slow eccentric lowering, Lt foot slightly forward.     Shoulder Exercises: Supine    External Rotation AAROM;Left;5 reps   cane   Flexion AAROM;Both;5 reps   cane   Other Supine Exercises LE:  bridge  x5 reps (cramping in LLE after 3, resolved after HS stretch)      Shoulder Exercises: ROM/Strengthening   Nustep L5: arms/legs x 6 min at 60-70 SPM      Manual Therapy   Soft tissue mobilization STM to Lt pec major minor, upper trap and levator.               PT Short Term Goals - 10/01/21 0931       PT SHORT TERM GOAL #1   Title Patient will be independent in her home exercise program for shoulder ROM.    Time 6    Period Weeks    Status On-going    Target Date 10/22/21      PT SHORT TERM GOAL #2   Title Patient will increase left shoulder flexion to >/= 120 degrees for more functional reach.    Baseline -    Time 6    Period Weeks    Status Achieved    Target Date 10/22/21      PT SHORT TERM GOAL #3   Title Patient will increase left shoulder abduction to >/= 120 degrees to more easily obtain surgical position and reach with greater ease.    Baseline -    Time 6    Period Weeks    Status On-going    Target Date 10/22/21      PT SHORT TERM GOAL #4   Title Patient will improve her DASH score to be </= 40 for improved overall UE function.    Baseline -    Time 6    Period Weeks    Status On-going    Target Date 10/22/21      PT SHORT TERM GOAL #5   Title Patient will report >/= 25% decrease in left shoulder pain with daily tasks.    Time 6    Period Weeks    Status Achieved    Target Date 10/22/21               PT Long Term Goals - 10/01/21 0931       PT LONG TERM GOAL #1   Title Patient will demonstrate she has  regained left shoulder function post operatively when compared to baselines.    Time 8    Period Weeks    Status On-going      PT LONG TERM GOAL #2   Title Decrease pain Rt LE by 50-75% allowing patient to stand, walk, move in/out of transitional movements with improved safety and decreased pain    Baseline 50%  improvement: 09/21/21.    Time 6    Period Weeks    Status Achieved      PT LONG TERM GOAL #3   Title Improve tissue extensibility Rt hamstrings and calf with patient to demonstrate ~ 80 deg hamstring ROM in supine    Time 6    Period Weeks    Status On-going      PT LONG TERM GOAL #4   Title Patient demonstrates improved gait pattern with rollator with decreased antalgic gait pattern and greater safety/independence    Time 6    Period Weeks    Status On-going      PT LONG TERM GOAL #5   Title Independent in HEP for LE's as indicated    Time 6    Period Weeks    Status On-going                   Plan - 10/01/21 1006     Clinical Impression Statement Pt very limited with attempt at ascending/descending 4 and 6" stairs with RLE.  Pt reported reduction in tightness and pain in Lt shoulder with shoulder abdct/flexion after STM to pec major and deltoid.  Pt's overall pain level in Lt shoulder and Rt LE have improved; ROM and strength continue to be limited.  Pt has met STG#5 and is progressing gradually towards all other therapy goals. Pt will benefit from continued PT intervention to improve functional mobility with less pain.    Rehab Potential Good    PT Frequency 2x / week    PT Duration 6 weeks    PT Treatment/Interventions ADLs/Self Care Home Management;Therapeutic exercise;Therapeutic activities;Patient/family education;Manual techniques;Passive range of motion    PT Next Visit Plan continue to consolidate HEP; continue PROM left shoulder and AAROM exercises.  RLE strengthening and stretches.    PT Home Exercise Plan HEP: [email or web address]    Consulted and Agree with Plan of Care Patient             Patient will benefit from skilled therapeutic intervention in order to improve the following deficits and impairments:  Decreased knowledge of precautions, Impaired UE functional use, Pain, Postural dysfunction, Decreased range of motion, Abnormal gait, Decreased activity  tolerance, Impaired flexibility, Decreased mobility, Difficulty walking  Visit Diagnosis: Chronic left shoulder pain  Stiffness of left shoulder, not elsewhere classified  Pain in right leg  Abnormal posture     Problem List Patient Active Problem List   Diagnosis Date Noted   Malignant neoplasm of upper-outer quadrant of left breast in female, estrogen receptor positive ([place]) 08/27/2021   Abnormal ultrasound of endometrium 04/08/2021   Kerin Perna, PTA 10/01/21 10:10 AM  Celyn P. Helene Kelp PT, MPH 10/01/21 12:02 PM    [place], [place], [address] Phone: [phone]   Fax:  [phone]  Name: Brooke Duncan MRN: [id number] Date of Birth: 1946-03-04

## 2021-10-04 ENCOUNTER — Encounter: Payer: Self-pay | Admitting: General Practice

## 2021-10-04 ENCOUNTER — Ambulatory Visit (INDEPENDENT_AMBULATORY_CARE_PROVIDER_SITE_OTHER): Payer: Medicare Other | Admitting: Rehabilitative and Restorative Service Providers"

## 2021-10-04 ENCOUNTER — Other Ambulatory Visit: Payer: Self-pay

## 2021-10-04 DIAGNOSIS — M25512 Pain in left shoulder: Secondary | ICD-10-CM

## 2021-10-04 DIAGNOSIS — R293 Abnormal posture: Secondary | ICD-10-CM

## 2021-10-04 DIAGNOSIS — G8929 Other chronic pain: Secondary | ICD-10-CM

## 2021-10-04 DIAGNOSIS — M79604 Pain in right leg: Secondary | ICD-10-CM

## 2021-10-04 DIAGNOSIS — R29898 Other symptoms and signs involving the musculoskeletal system: Secondary | ICD-10-CM | POA: Diagnosis not present

## 2021-10-04 DIAGNOSIS — M25612 Stiffness of left shoulder, not elsewhere classified: Secondary | ICD-10-CM | POA: Diagnosis not present

## 2021-10-04 NOTE — Progress Notes (Signed)
[facility] Spiritual Care Note  Left voicemail to check in about connecting with resources, encouraging return call.   [place], [place], [facility] Pager [phone] Voicemail [phone]

## 2021-10-04 NOTE — Patient Instructions (Signed)
Access Code: [email or web address] URL: [email or web address] Date: 10/04/2021 Prepared by: Gillermo Murdoch  Exercises Shoulder External Rotation and Scapular Retraction with Resistance - 2 x daily - 7 x weekly - 3 sets - 10 reps Scapular Retraction with Resistance - 2 x daily - 7 x weekly - 3 sets - 10 reps Scapular Retraction with Resistance Advanced - 2 x daily - 7 x weekly - 3 sets - 10 reps Wall Push Up - 2 x daily - 7 x weekly - 1 sets - 10 reps - 3 sec hold

## 2021-10-04 NOTE — Therapy (Signed)
[facility] [place], [place], [address] Phone: [phone]   Fax:  [phone]  Physical Therapy Treatment  Patient Details  Name: Brooke Duncan MRN: [id number] Date of Birth: 22-Sep-1946 Referring Provider (PT): Dr. Rolm Bookbinder   Encounter Date: 10/04/2021   PT End of Session - 10/04/21 1043     Visit Number 11    Number of Visits 21    Date for PT Re-Evaluation 10/22/21    Authorization - Visit Number 11    Progress Note Due on Visit 20    PT Start Time 0930    PT Stop Time 8502    PT Time Calculation (min) 48 min             Past Medical History:  Diagnosis Date   Acquired solitary kidney    right side 2014   Arthritis    Frequency of urination    Heart murmur    per told by pcp approx. 02/ 2022 heard a very faint murmur, told did need work-up done at this time   History of Hashimoto thyroiditis    s/p  total thyroidectomy 1995   History of hypercalcemia    s/p  parathryoidectomy 07/ 2021   History of renal cell carcinoma 2014   s/p  left nephrectomy, pt stated no other treatment   Hypertension    followed by pcp   Hypothyroidism, postsurgical 1995   followed by pcp in [place], moved from [place] 01/ 2021   OSA on CPAP    Thickened endometrium    Wears glasses     Past Surgical History:  Procedure Laterality Date   HYSTEROSCOPY WITH D & C N/A 04/08/2021   Procedure: DILATATION AND CURETTAGE /HYSTEROSCOPY, EXCISION OF VAGINAL LESION;  Surgeon: Delsa Bern, MD;  Location: [facility];  Service: Gynecology;  Laterality: N/A;   LAPAROSCOPIC CHOLECYSTECTOMY  2005   NEPHRECTOMY Left 2014   PARATHYROIDECTOMY  07/ 2021  in [place]  age 71   TOTAL HIP ARTHROPLASTY Bilateral left 2015;  right 2009   TOTAL KNEE ARTHROPLASTY Left 2014   TOTAL THYROIDECTOMY Bilateral 1995    There were no vitals filed for this visit.   Subjective Assessment -  10/04/21 0944     Subjective Doing pretty well ths morning. Working on her exercises at home. She ordered a pulley but it is not here yet.    Currently in Pain? Yes    Pain Score 2     Pain Location Shoulder    Pain Orientation Left    Pain Descriptors / Indicators Sore    Pain Type Acute pain;Chronic pain    Pain Onset More than a month ago    Pain Frequency Intermittent    Aggravating Factors  reaching    Pain Relieving Factors rest    Pain Score 1    Pain Location Leg    Pain Orientation Right    Pain Descriptors / Indicators Tightness                [facility] PT Assessment - 10/04/21 0001       Assessment   Medical Diagnosis Left breast cancer; left shoulder pain, Rt knee/LE pain    Referring Provider (PT) Dr. Rolm Bookbinder    Onset Date/Surgical Date 06/09/21   08/30/21 injury to Rt leg   Hand Dominance Right    Prior Therapy none      PROM   Left Shoulder Flexion  135 Degrees    Left Shoulder ABduction 137 Degrees   in scapular plane     Flexibility   Hamstrings tight Rt ~ 70 deg; Lt 70 deg                           [facility] Adult PT Treatment/Exercise - 10/04/21 0001       Knee/Hip Exercises: Stretches   Passive Hamstring Stretch Right;2 reps;20 seconds    Passive Hamstring Stretch Limitations seated - repeated in supine 30 sec x 3 reps    Hip Flexor Stretch Right;2 reps;Left;1 rep;20 seconds      Knee/Hip Exercises: Standing   Functional Squat 10 reps    Functional Squat Limitations mini squat working on equal WB bilat LE's - shifts wt to Lt and drops Rt shoulder - working to Health and safety inspector workingon gait with hip and knee extension and equal wt shift to both sides; working to improve wt shift      Knee/Hip Exercises: Seated   Sit to [employer] 10 reps;without UE support   slow eccentric lowering, Lt foot slightly forward.     Shoulder Exercises: Supine   Flexion AAROM;Both;5 reps   holding Lt UE with Rt   Other Supine Exercises  contrat relax with hands behind head pt pushing elbow down 5 sec hold x 5 reps      Shoulder Exercises: Pulleys   Flexion Limitations 10 reps x 10 sec hold    Scaption Limitations 10 reps x 10 sec hold      Shoulder Exercises: ROM/Strengthening   Nustep L5: arms/legs x 7 min at 60-70 SPM    Wall Pushups 10 reps      Manual Therapy   Soft tissue mobilization STM to Lt pec major/minor, biceps; anterior deltoid; upper trap and levator.    Passive ROM PROM Lt shoulder flexion; scapution; ER in scapular plane; extension; horizontal abduction                     PT Education - 10/04/21 1023     Education Details HEP    Person(s) Educated Patient    Methods Explanation;Demonstration;Tactile cues;Verbal cues;Handout    Comprehension Verbalized understanding;Returned demonstration;Verbal cues required;Tactile cues required              PT Short Term Goals - 10/01/21 0931       PT SHORT TERM GOAL #1   Title Patient will be independent in her home exercise program for shoulder ROM.    Time 6    Period Weeks    Status On-going    Target Date 10/22/21      PT SHORT TERM GOAL #2   Title Patient will increase left shoulder flexion to >/= 120 degrees for more functional reach.    Baseline -    Time 6    Period Weeks    Status Achieved    Target Date 10/22/21      PT SHORT TERM GOAL #3   Title Patient will increase left shoulder abduction to >/= 120 degrees to more easily obtain surgical position and reach with greater ease.    Baseline -    Time 6    Period Weeks    Status On-going    Target Date 10/22/21      PT SHORT TERM GOAL #4   Title Patient will improve her DASH score to be </= 40 for improved overall UE function.  Baseline -    Time 6    Period Weeks    Status On-going    Target Date 10/22/21      PT SHORT TERM GOAL #5   Title Patient will report >/= 25% decrease in left shoulder pain with daily tasks.    Time 6    Period Weeks    Status  Achieved    Target Date 10/22/21               PT Long Term Goals - 10/01/21 0931       PT LONG TERM GOAL #1   Title Patient will demonstrate she has regained left shoulder function post operatively when compared to baselines.    Time 8    Period Weeks    Status On-going      PT LONG TERM GOAL #2   Title Decrease pain Rt LE by 50-75% allowing patient to stand, walk, move in/out of transitional movements with improved safety and decreased pain    Baseline 50% improvement: 09/21/21.    Time 6    Period Weeks    Status Achieved      PT LONG TERM GOAL #3   Title Improve tissue extensibility Rt hamstrings and calf with patient to demonstrate ~ 80 deg hamstring ROM in supine    Time 6    Period Weeks    Status On-going      PT LONG TERM GOAL #4   Title Patient demonstrates improved gait pattern with rollator with decreased antalgic gait pattern and greater safety/independence    Time 6    Period Weeks    Status On-going      PT LONG TERM GOAL #5   Title Independent in HEP for LE's as indicated    Time 6    Period Weeks    Status On-going                   Plan - 10/04/21 0946     Clinical Impression Statement Patient reports that she continues to notice gradual improvement. Working on mobility and ROM as well as strengthening. Continue with manual work and PROM/stretching/strengthening. Progressing gradually toward stated goals of therapy.    Rehab Potential Good    PT Frequency 2x / week    PT Duration 6 weeks    PT Treatment/Interventions ADLs/Self Care Home Management;Therapeutic exercise;Therapeutic activities;Patient/family education;Manual techniques;Passive range of motion    PT Next Visit Plan continue to consolidate HEP; continue PROM left shoulder and AAROM exercises.  RLE strengthening and stretches.    PT Home Exercise Plan [id number]    Consulted and Agree with Plan of Care Patient             Patient will benefit from skilled therapeutic  intervention in order to improve the following deficits and impairments:     Visit Diagnosis: Chronic left shoulder pain  Stiffness of left shoulder, not elsewhere classified  Pain in right leg  Abnormal posture  Other symptoms and signs involving the musculoskeletal system     Problem List Patient Active Problem List   Diagnosis Date Noted   Malignant neoplasm of upper-outer quadrant of left breast in female, estrogen receptor positive ([place]) 08/27/2021   Abnormal ultrasound of endometrium 04/08/2021    [place] Nilda Simmer, PT, MPH 10/04/2021, 10:46 AM  [facility] [place], [place], [address] Phone: [phone]   Fax:  [phone]  Name: Brooke Duncan MRN: [id number] Date of  Birth: [birth year]

## 2021-10-07 ENCOUNTER — Telehealth: Payer: Self-pay | Admitting: *Deleted

## 2021-10-07 ENCOUNTER — Encounter: Payer: Self-pay | Admitting: Physical Therapy

## 2021-10-07 ENCOUNTER — Other Ambulatory Visit: Payer: Self-pay

## 2021-10-07 ENCOUNTER — Ambulatory Visit (INDEPENDENT_AMBULATORY_CARE_PROVIDER_SITE_OTHER): Payer: Medicare Other | Admitting: Physical Therapy

## 2021-10-07 DIAGNOSIS — M25512 Pain in left shoulder: Secondary | ICD-10-CM | POA: Diagnosis present

## 2021-10-07 DIAGNOSIS — M79604 Pain in right leg: Secondary | ICD-10-CM | POA: Diagnosis not present

## 2021-10-07 DIAGNOSIS — R293 Abnormal posture: Secondary | ICD-10-CM

## 2021-10-07 DIAGNOSIS — G8929 Other chronic pain: Secondary | ICD-10-CM | POA: Diagnosis not present

## 2021-10-07 DIAGNOSIS — M25612 Stiffness of left shoulder, not elsewhere classified: Secondary | ICD-10-CM

## 2021-10-07 DIAGNOSIS — R29898 Other symptoms and signs involving the musculoskeletal system: Secondary | ICD-10-CM

## 2021-10-07 NOTE — Therapy (Signed)
[facility] [place], [place], [address] Phone: [phone]   Fax:  [phone]  Physical Therapy Treatment  Patient Details  Name: Brooke Duncan MRN: [id number] Date of Birth: 06-Jan-1946 Referring Provider (PT): Dr. Rolm Bookbinder   Encounter Date: 10/07/2021   PT End of Session - 10/07/21 0937     Visit Number 12    Number of Visits 21    Date for PT Re-Evaluation 10/22/21    Authorization - Visit Number 12    Progress Note Due on Visit 20    PT Start Time 0932    PT Stop Time 1013    PT Time Calculation (min) 41 min             Past Medical History:  Diagnosis Date   Acquired solitary kidney    right side 2014   Arthritis    Frequency of urination    Heart murmur    per told by pcp approx. 02/ 2022 heard a very faint murmur, told did need work-up done at this time   History of Hashimoto thyroiditis    s/p  total thyroidectomy 1995   History of hypercalcemia    s/p  parathryoidectomy 07/ 2021   History of renal cell carcinoma 2014   s/p  left nephrectomy, pt stated no other treatment   Hypertension    followed by pcp   Hypothyroidism, postsurgical 1995   followed by pcp in [place], moved from [place] 01/ 2021   OSA on CPAP    Thickened endometrium    Wears glasses     Past Surgical History:  Procedure Laterality Date   HYSTEROSCOPY WITH D & C N/A 04/08/2021   Procedure: DILATATION AND CURETTAGE /HYSTEROSCOPY, EXCISION OF VAGINAL LESION;  Surgeon: Delsa Bern, MD;  Location: [facility];  Service: Gynecology;  Laterality: N/A;   LAPAROSCOPIC CHOLECYSTECTOMY  2005   NEPHRECTOMY Left 2014   PARATHYROIDECTOMY  07/ 2021  in [place]  age 41   TOTAL HIP ARTHROPLASTY Bilateral left 2015;  right 2009   TOTAL KNEE ARTHROPLASTY Left 2014   TOTAL THYROIDECTOMY Bilateral 1995    There were no vitals filed for this visit.   Subjective Assessment -  10/07/21 0935     Subjective Pt reports she didn't realize how sore her Lt (lateral upper) arm was until it was worked on last session. Her pulley arrived, but it was broken so she is sending it back. She states she is sore from doing wall push ups.    Patient Stated Goals Get the Rt knee better. Get arm moving better to tolerate surgery; prevent lymphedema and learn post op HEP    Pain Score 4     Pain Location Shoulder    Pain Orientation Left    Pain Descriptors / Indicators Sore    Aggravating Factors  reaching overhead, or reaching behind back    Pain Relieving Factors rest                [facility] PT Assessment - 10/07/21 0001       Assessment   Medical Diagnosis Left breast cancer; left shoulder pain, Rt knee/LE pain    Referring Provider (PT) Dr. Rolm Bookbinder    Onset Date/Surgical Date 06/09/21   08/30/21 injury to Rt leg   Hand Dominance Right    Prior Therapy none                [facility]  Adult PT Treatment/Exercise - 10/07/21 0001       Ambulation/Gait   Ambulation/Gait Yes    Ambulation/Gait Assistance 6: Modified independent (Device/Increase time)    Ambulation Distance (Feet) 100 Feet    Assistive device Rollator    Gait Pattern Step-through pattern;Decreased stance time - right;Decreased weight shift to right;Antalgic;Lateral trunk lean to right    Ambulation Surface Level;Indoor    Gait Comments cues for even wieght shift, even stance time, and level shoulders - improved with distance and cues.      Knee/Hip Exercises: Stretches   Passive Hamstring Stretch Right;2 reps;30 seconds   seated hip hinge   Hip Flexor Stretch Right;2 reps;20 seconds   seated.     Knee/Hip Exercises: Standing   Abduction Limitations 8 reps each leg; therapist gently blocking Rt knee when WB to avoid buckling.    Gait Training side stepping at counter x 5 ft Rt/Lt x 2 reps      Knee/Hip Exercises: Supine   Bridges 1 set;10 reps      Shoulder Exercises: Pulleys   Flexion  Limitations 5 reps x 10 sec hold    Scaption Limitations 10 reps x 10 sec hold      Shoulder Exercises: ROM/Strengthening   Nustep L5: arms/legs x 4.5 min at 60-70 SPM, for warm up and ROM.    Wall Pushups 10 reps      Shoulder Exercises: Stretch   Corner Stretch 1 rep;10 seconds    Corner Stretch Limitations and trial of low doorway stretch in low position x 20 sec x 2.    Star Gazer Stretch 2 reps;60 seconds   back of palms on forehead   Other Shoulder Stretches prolonged snow angel ~ 1 min x 2 reps (~70) bending elbow to release stretch as needed      Manual Therapy   Soft tissue mobilization STM to Lt pec major/minor, biceps brachii, triceps brachii; subscap, deltoid; upper trap and levator to decrease fascial restrictions and improve mobility.    Passive ROM PROM Lt shoulder flexion; ER in scapular plane; horiz abdct                       PT Short Term Goals - 10/01/21 0931       PT SHORT TERM GOAL #1   Title Patient will be independent in her home exercise program for shoulder ROM.    Time 6    Period Weeks    Status On-going    Target Date 10/22/21      PT SHORT TERM GOAL #2   Title Patient will increase left shoulder flexion to >/= 120 degrees for more functional reach.    Baseline -    Time 6    Period Weeks    Status Achieved    Target Date 10/22/21      PT SHORT TERM GOAL #3   Title Patient will increase left shoulder abduction to >/= 120 degrees to more easily obtain surgical position and reach with greater ease.    Baseline -    Time 6    Period Weeks    Status On-going    Target Date 10/22/21      PT SHORT TERM GOAL #4   Title Patient will improve her DASH score to be </= 40 for improved overall UE function.    Baseline -    Time 6    Period Weeks    Status On-going    Target Date  10/22/21      PT SHORT TERM GOAL #5   Title Patient will report >/= 25% decrease in left shoulder pain with daily tasks.    Time 6    Period Weeks     Status Achieved    Target Date 10/22/21               PT Long Term Goals - 10/01/21 0931       PT LONG TERM GOAL #1   Title Patient will demonstrate she has regained left shoulder function post operatively when compared to baselines.    Time 8    Period Weeks    Status On-going      PT LONG TERM GOAL #2   Title Decrease pain Rt LE by 50-75% allowing patient to stand, walk, move in/out of transitional movements with improved safety and decreased pain    Baseline 50% improvement: 09/21/21.    Time 6    Period Weeks    Status Achieved      PT LONG TERM GOAL #3   Title Improve tissue extensibility Rt hamstrings and calf with patient to demonstrate ~ 80 deg hamstring ROM in supine    Time 6    Period Weeks    Status On-going      PT LONG TERM GOAL #4   Title Patient demonstrates improved gait pattern with rollator with decreased antalgic gait pattern and greater safety/independence    Time 6    Period Weeks    Status On-going      PT LONG TERM GOAL #5   Title Independent in HEP for LE's as indicated    Time 6    Period Weeks    Status On-going                   Plan - 10/07/21 1325     Clinical Impression Statement Pt's exercise tolerance gradually improving each visit.  L shoulder ROM gradually improving.  Continued tenderness throughout Rt shoulder, shoulder girdle and upper arm.  Plan to continue progressing with RLE strength and Lt shoulder ROM/ functional strength.    Rehab Potential Good    PT Frequency 2x / week    PT Duration 6 weeks    PT Treatment/Interventions ADLs/Self Care Home Management;Therapeutic exercise;Therapeutic activities;Patient/family education;Manual techniques;Passive range of motion    PT Next Visit Plan continue to consolidate HEP; continue PROM left shoulder and AAROM exercises.  RLE strengthening and stretches.    PT Home Exercise Plan [id number]    Consulted and Agree with Plan of Care Patient             Patient will  benefit from skilled therapeutic intervention in order to improve the following deficits and impairments:  Decreased knowledge of precautions, Impaired UE functional use, Pain, Postural dysfunction, Decreased range of motion, Abnormal gait, Decreased activity tolerance, Impaired flexibility, Decreased mobility, Difficulty walking  Visit Diagnosis: Chronic left shoulder pain  Stiffness of left shoulder, not elsewhere classified  Pain in right leg  Abnormal posture  Other symptoms and signs involving the musculoskeletal system     Problem List Patient Active Problem List   Diagnosis Date Noted   Malignant neoplasm of upper-outer quadrant of left breast in female, estrogen receptor positive ([place]) 08/27/2021   Abnormal ultrasound of endometrium 04/08/2021   Kerin Perna, PTA 10/07/21 1:29 PM  [facility] [place], [place], [address] Phone: [phone]   Fax:  [phone]  Name:  Brooke Duncan MRN: [id number] Date of Birth: [birth year]

## 2021-10-07 NOTE — Telephone Encounter (Signed)
Spoke with patient regarding some questions she had about her MRI bx's coming up.  Patient is still undecided about surgical approach and requested to speak with someone that has had mast without reconstruction. I have sent a request for an alight guide for her. Encouraged her to call with any other questions or concerns.

## 2021-10-11 ENCOUNTER — Encounter: Payer: Medicare Other | Admitting: Physical Therapy

## 2021-10-12 ENCOUNTER — Other Ambulatory Visit: Payer: Self-pay

## 2021-10-12 ENCOUNTER — Ambulatory Visit (INDEPENDENT_AMBULATORY_CARE_PROVIDER_SITE_OTHER): Payer: Medicare Other | Admitting: Physical Therapy

## 2021-10-12 DIAGNOSIS — R29898 Other symptoms and signs involving the musculoskeletal system: Secondary | ICD-10-CM | POA: Diagnosis not present

## 2021-10-12 DIAGNOSIS — M25612 Stiffness of left shoulder, not elsewhere classified: Secondary | ICD-10-CM

## 2021-10-12 DIAGNOSIS — R293 Abnormal posture: Secondary | ICD-10-CM | POA: Diagnosis not present

## 2021-10-12 DIAGNOSIS — M25512 Pain in left shoulder: Secondary | ICD-10-CM | POA: Diagnosis present

## 2021-10-12 DIAGNOSIS — G8929 Other chronic pain: Secondary | ICD-10-CM | POA: Diagnosis not present

## 2021-10-12 DIAGNOSIS — M79604 Pain in right leg: Secondary | ICD-10-CM

## 2021-10-12 NOTE — Therapy (Signed)
[place], [place], [address] Phone: [phone]   Fax:  [phone]  Physical Therapy Treatment  Patient Details  Name: Brooke Duncan MRN: [id number] Date of Birth: [birth year] Referring Provider (PT): Dr. Rolm Bookbinder   Encounter Date: 10/12/2021   PT End of Session - 10/12/21 0859     Visit Number 13    Number of Visits 21    Date for PT Re-Evaluation 10/22/21    Authorization - Visit Number 13    Progress Note Due on Visit 72    PT Start Time 9024   pt arrived late.   PT Stop Time 0928    PT Time Calculation (min) 33 min    Activity Tolerance Patient tolerated treatment well    Behavior During Therapy WFL for tasks assessed/performed             Past Medical History:  Diagnosis Date   Acquired solitary kidney    right side 2014   Arthritis    Frequency of urination    Heart murmur    per told by pcp approx. 02/ 2022 heard a very faint murmur, told did need work-up done at this time   History of Hashimoto thyroiditis    s/p  total thyroidectomy 1995   History of hypercalcemia    s/p  parathryoidectomy 07/ 2021   History of renal cell carcinoma 2014   s/p  left nephrectomy, pt stated no other treatment   Hypertension    followed by pcp   Hypothyroidism, postsurgical 1995   followed by pcp in [place], moved from [place] 01/ 2021   OSA on CPAP    Thickened endometrium    Wears glasses     Past Surgical History:  Procedure Laterality Date   HYSTEROSCOPY WITH D & C N/A 04/08/2021   Procedure: DILATATION AND CURETTAGE /HYSTEROSCOPY, EXCISION OF VAGINAL LESION;  Surgeon: Delsa Bern, MD;  Location: [facility];  Service: Gynecology;  Laterality: N/A;   LAPAROSCOPIC CHOLECYSTECTOMY  2005   NEPHRECTOMY Left 2014   PARATHYROIDECTOMY  07/ 2021  in [place]  age 75   TOTAL HIP ARTHROPLASTY Bilateral left 2015;  right 2009   TOTAL KNEE  ARTHROPLASTY Left 2014   TOTAL THYROIDECTOMY Bilateral 1995    There were no vitals filed for this visit.   Subjective Assessment - 10/12/21 0901     Subjective Pt reports that she has less tension in her RLE now.  Her pulley arrived Sunday and she has used it.    Patient Stated Goals Get the Rt knee better. Get arm moving better to tolerate surgery; prevent lymphedema and learn post op HEP    Currently in Pain? Yes    Pain Score 2     Pain Location Shoulder    Pain Orientation Left    Pain Descriptors / Indicators Sore    Aggravating Factors  reaching overhead, reaching behind back    Pain Relieving Factors rest.                [facility] PT Assessment - 10/12/21 0001       Assessment   Medical Diagnosis Left breast cancer; left shoulder pain, Rt knee/LE pain    Referring Provider (PT) Dr. Rolm Bookbinder    Onset Date/Surgical Date 06/09/21   08/30/21 injury to Rt leg   Hand Dominance Right    Prior Therapy none  [facility] Adult PT Treatment/Exercise - 10/12/21 0001       Elbow Exercises   Elbow Flexion Strengthening;10 reps;Both   3#     Knee/Hip Exercises: Stretches   Passive Hamstring Stretch Right;2 reps;20 seconds   seated with hip hinge   Hip Flexor Stretch Right;20 seconds;1 rep   seated.     Knee/Hip Exercises: Standing   Forward Step Up Right;1 set;Hand Hold: 2   6 steps   Forward Step Up Limitations and forward step downs with RLE - unable to come down leading with LLE due to Rt knee pain.    Gait Training side stepping at counter x 8 ft Rt/Lt x 2 reps.  retro gait with hand on counter x 6 ft x 2.    Other Standing Knee Exercises foot taps to 4" step with intermittent support x 12, to 6" step x 4 (each leg)      Shoulder Exercises: Standing   Other Standing Exercises wall ladder flexion with LUE to #24 x 2 reps      Shoulder Exercises: Pulleys   Flexion Limitations 5 reps x 5 sec hold    ABduction Limitations 5 reps x 5 sec hold       Shoulder Exercises: ROM/Strengthening   Nustep L5: arms/legs x 3.5 min at 60-70 SPM, for warm up and ROM.    Wall Pushups 10 reps      Shoulder Exercises: Stretch   Internal Rotation Stretch --   8 reps - 5-10 sec hold with strap   Other Shoulder Stretches Lt bicep brachii stretch holding counter x 15 sec x 2                  PT Short Term Goals - 10/01/21 0931       PT SHORT TERM GOAL #1   Title Patient will be independent in her home exercise program for shoulder ROM.    Time 6    Period Weeks    Status On-going    Target Date 10/22/21      PT SHORT TERM GOAL #2   Title Patient will increase left shoulder flexion to >/= 120 degrees for more functional reach.    Baseline -    Time 6    Period Weeks    Status Achieved    Target Date 10/22/21      PT SHORT TERM GOAL #3   Title Patient will increase left shoulder abduction to >/= 120 degrees to more easily obtain surgical position and reach with greater ease.    Baseline -    Time 6    Period Weeks    Status On-going    Target Date 10/22/21      PT SHORT TERM GOAL #4   Title Patient will improve her DASH score to be </= 40 for improved overall UE function.    Baseline -    Time 6    Period Weeks    Status On-going    Target Date 10/22/21      PT SHORT TERM GOAL #5   Title Patient will report >/= 25% decrease in left shoulder pain with daily tasks.    Time 6    Period Weeks    Status Achieved    Target Date 10/22/21               PT Long Term Goals - 10/01/21 0931       PT LONG TERM GOAL #1   Title Patient will demonstrate  she has regained left shoulder function post operatively when compared to baselines.    Time 8    Period Weeks    Status On-going      PT LONG TERM GOAL #2   Title Decrease pain Rt LE by 50-75% allowing patient to stand, walk, move in/out of transitional movements with improved safety and decreased pain    Baseline 50% improvement: 09/21/21.    Time 6    Period Weeks     Status Achieved      PT LONG TERM GOAL #3   Title Improve tissue extensibility Rt hamstrings and calf with patient to demonstrate ~ 80 deg hamstring ROM in supine    Time 6    Period Weeks    Status On-going      PT LONG TERM GOAL #4   Title Patient demonstrates improved gait pattern with rollator with decreased antalgic gait pattern and greater safety/independence    Time 6    Period Weeks    Status On-going      PT LONG TERM GOAL #5   Title Independent in HEP for LE's as indicated    Time 6    Period Weeks    Status On-going                   Plan - 10/12/21 3976     Clinical Impression Statement Gait quality improving with cues and repetition.  She is able to ascend 4" step with RLE, but unable to ascend 6" or descend 4" using RLE.  Her WB through RLE is improving.  Functional ROM in Lt shoulder gradually improving.  She completed exercises with only minor increase in discomfort. Encouraged pt to ice knee at home after session and use pulleys 2x/day.  Progressing well towards LTGs.    Rehab Potential Good    PT Frequency 2x / week    PT Duration 6 weeks    PT Treatment/Interventions ADLs/Self Care Home Management;Therapeutic exercise;Therapeutic activities;Patient/family education;Manual techniques;Passive range of motion    PT Next Visit Plan continue PROM left shoulder and AAROM exercises.  RLE strengthening and stretches.    PT Home Exercise Plan [email or web address]    Consulted and Agree with Plan of Care Patient             Patient will benefit from skilled therapeutic intervention in order to improve the following deficits and impairments:  Decreased knowledge of precautions, Impaired UE functional use, Pain, Postural dysfunction, Decreased range of motion, Abnormal gait, Decreased activity tolerance, Impaired flexibility, Decreased mobility, Difficulty walking  Visit Diagnosis: Chronic left shoulder pain  Stiffness of left shoulder, not elsewhere classified  Pain  in right leg  Abnormal posture  Other symptoms and signs involving the musculoskeletal system     Problem List Patient Active Problem List   Diagnosis Date Noted   Malignant neoplasm of upper-outer quadrant of left breast in female, estrogen receptor positive ([place]) 08/27/2021   Abnormal ultrasound of endometrium 04/08/2021   Kerin Perna, PTA 10/12/21 9:34 AM  [place], [place], [address] Phone: [phone]   Fax:  [phone]  Name: Shakala Marlatt MRN: [id number] Date of Birth: 1946/05/16

## 2021-10-15 ENCOUNTER — Encounter: Payer: Medicare Other | Admitting: Physical Therapy

## 2021-10-18 ENCOUNTER — Encounter: Payer: Self-pay | Admitting: Rehabilitative and Restorative Service Providers"

## 2021-10-18 ENCOUNTER — Ambulatory Visit (INDEPENDENT_AMBULATORY_CARE_PROVIDER_SITE_OTHER): Payer: Medicare Other | Admitting: Rehabilitative and Restorative Service Providers"

## 2021-10-18 ENCOUNTER — Other Ambulatory Visit: Payer: Self-pay

## 2021-10-18 DIAGNOSIS — R293 Abnormal posture: Secondary | ICD-10-CM

## 2021-10-18 DIAGNOSIS — M25512 Pain in left shoulder: Secondary | ICD-10-CM

## 2021-10-18 DIAGNOSIS — R29898 Other symptoms and signs involving the musculoskeletal system: Secondary | ICD-10-CM | POA: Diagnosis not present

## 2021-10-18 DIAGNOSIS — G8929 Other chronic pain: Secondary | ICD-10-CM | POA: Diagnosis not present

## 2021-10-18 DIAGNOSIS — M25612 Stiffness of left shoulder, not elsewhere classified: Secondary | ICD-10-CM

## 2021-10-18 DIAGNOSIS — M79604 Pain in right leg: Secondary | ICD-10-CM

## 2021-10-18 NOTE — Patient Instructions (Signed)
Access Code: [email or web address] URL: [email or web address] Date: 10/18/2021 Prepared by: Gillermo Murdoch  Exercises Shoulder External Rotation and Scapular Retraction with Resistance - 2 x daily - 7 x weekly - 3 sets - 10 reps Scapular Retraction with Resistance - 2 x daily - 7 x weekly - 3 sets - 10 reps Scapular Retraction with Resistance Advanced - 2 x daily - 7 x weekly - 3 sets - 10 reps Wall Push Up - 2 x daily - 7 x weekly - 1 sets - 10 reps - 3 sec hold Hooklying Hamstring Stretch with Strap - 2 x daily - 7 x weekly - 1 sets - 3 reps - 30 sec hold Seated Hamstring Stretch - 2 x daily - 7 x weekly - 1 sets - 3 reps - 30 sec hold Supine ITB Stretch with Strap - 2 x daily - 7 x weekly - 1 sets - 3 reps - 30 sec hold Hip Adductors and Hamstring Stretch with Strap - 2 x daily - 7 x weekly - 1 sets - 3 reps - 30 sec hold Supine Quad Set - 2 x daily - 7 x weekly - 1 sets - 10 reps - 3-5 sec hold Active Straight Leg Raise with Quad Set - 2 x daily - 7 x weekly - 1 sets - 10 reps - 3 sec hold  Patient Education walking program

## 2021-10-18 NOTE — Therapy (Signed)
[place], [place], [address] Phone: [phone]   Fax:  [phone]  Physical Therapy Treatment  Patient Details  Name: Brooke Duncan MRN: [id number] Date of Birth: Oct 31, 1946 Referring Provider (PT): Dr. Rolm Bookbinder   Encounter Date: 10/18/2021   PT End of Session - 10/18/21 0936     Visit Number 14    Number of Visits 21    Date for PT Re-Evaluation 10/22/21    Authorization - Visit Number 14    Progress Note Due on Visit 20    PT Start Time 0933    PT Stop Time 4627   [place] end of treatment   PT Time Calculation (min) 46 min    Activity Tolerance Patient tolerated treatment well             Past Medical History:  Diagnosis Date   Acquired solitary kidney    right side 2014   Arthritis    Frequency of urination    Heart murmur    per told by pcp approx. 02/ 2022 heard a very faint murmur, told did need work-up done at this time   History of Hashimoto thyroiditis    s/p  total thyroidectomy 1995   History of hypercalcemia    s/p  parathryoidectomy 07/ 2021   History of renal cell carcinoma 2014   s/p  left nephrectomy, pt stated no other treatment   Hypertension    followed by pcp   Hypothyroidism, postsurgical 1995   followed by pcp in [place], moved from [place] 01/ 2021   OSA on CPAP    Thickened endometrium    Wears glasses     Past Surgical History:  Procedure Laterality Date   HYSTEROSCOPY WITH D & C N/A 04/08/2021   Procedure: DILATATION AND CURETTAGE /HYSTEROSCOPY, EXCISION OF VAGINAL LESION;  Surgeon: Delsa Bern, MD;  Location: [facility];  Service: Gynecology;  Laterality: N/A;   LAPAROSCOPIC CHOLECYSTECTOMY  2005   NEPHRECTOMY Left 2014   PARATHYROIDECTOMY  07/ 2021  in [place]  age 75   TOTAL HIP ARTHROPLASTY Bilateral left 2015;  right 2009   TOTAL KNEE ARTHROPLASTY Left 2014   TOTAL THYROIDECTOMY Bilateral 1995     There were no vitals filed for this visit.   Subjective Assessment - 10/18/21 0936     Subjective Patient reports increased Rt LE pain in the past few days. She has been trying to be more active and this may have increased symptoms.    Currently in Pain? Yes    Pain Score 4     Pain Location Shoulder    Pain Orientation Left    Pain Descriptors / Indicators Aching;Tightness   stiff   Pain Type Acute pain;Chronic pain    Pain Score 4    Pain Location Leg    Pain Orientation Right    Pain Descriptors / Indicators Tightness;Aching    Pain Type Chronic pain                [facility] PT Assessment - 10/18/21 0001       Assessment   Medical Diagnosis Left breast cancer; left shoulder pain, Rt knee/LE pain    Referring Provider (PT) Dr. Rolm Bookbinder    Onset Date/Surgical Date 06/09/21   08/30/21 injury to Rt leg   Hand Dominance Right    Prior Therapy none      PROM   Left Shoulder Flexion 140 Degrees  Left Shoulder ABduction 141 Degrees   in scapular plane   Left Shoulder External Rotation 67 Degrees   in scapular plane     Palpation   Palpation comment tight Rt posterior thigh to calf; Lt shoulder girdle - pecs; upper trap; teres; biceps; triceps; deltoid                           [facility] Adult PT Treatment/Exercise - 10/18/21 0001       Therapeutic Activites    Therapeutic Activities Other Therapeutic Activities    Other Therapeutic Activities myofacial release teres on Lt in supine      Knee/Hip Exercises: Stretches   Active Hamstring Stretch Right;3 reps;30 seconds    Active Hamstring Stretch Limitations seated    Passive Hamstring Stretch Right;2 reps;30 seconds    Passive Hamstring Stretch Limitations supine with strap    ITB Stretch Right;2 reps;30 seconds    ITB Stretch Limitations supine with strap    Other Knee/Hip Stretches hip adductor/HS stretch supine with strap 30 sec x 2 reps      Knee/Hip Exercises: Supine   Quad Sets  Strengthening;Right;10 reps    Quad Sets Limitations 5 sec hold    Straight Leg Raises Strengthening;Right;5 reps    Straight Leg Raises Limitations 5 sec hold ~ 10 inch lifr      Shoulder Exercises: Supine   Flexion AAROM;Both;5 reps      Shoulder Exercises: Pulleys   Flexion Limitations 5 reps x 5 sec hold    ABduction Limitations 5 reps x 5 sec hold      Moist Heat Therapy   Number Minutes Moist Heat 10 Minutes    Moist Heat Location Knee   posterior distal thigh/knee     Manual Therapy   Soft tissue mobilization STM to Lt pec major/minor, biceps, triceps; subscap, deltoid; upper trap and levator to decrease fascial restrictions and improve mobility.    Passive ROM PROM Lt shoulder flexion; abduction and ER in scapular plane                     PT Education - 10/18/21 1001     Education Details HEP    Person(s) Educated Patient    Methods Explanation;Demonstration;Tactile cues;Verbal cues;Handout    Comprehension Verbalized understanding;Returned demonstration;Verbal cues required;Tactile cues required              PT Short Term Goals - 10/01/21 0931       PT SHORT TERM GOAL #1   Title Patient will be independent in her home exercise program for shoulder ROM.    Time 6    Period Weeks    Status On-going    Target Date 10/22/21      PT SHORT TERM GOAL #2   Title Patient will increase left shoulder flexion to >/= 120 degrees for more functional reach.    Baseline -    Time 6    Period Weeks    Status Achieved    Target Date 10/22/21      PT SHORT TERM GOAL #3   Title Patient will increase left shoulder abduction to >/= 120 degrees to more easily obtain surgical position and reach with greater ease.    Baseline -    Time 6    Period Weeks    Status On-going    Target Date 10/22/21      PT SHORT TERM GOAL #4   Title Patient  will improve her DASH score to be </= 40 for improved overall UE function.    Baseline -    Time 6    Period Weeks     Status On-going    Target Date 10/22/21      PT SHORT TERM GOAL #5   Title Patient will report >/= 25% decrease in left shoulder pain with daily tasks.    Time 6    Period Weeks    Status Achieved    Target Date 10/22/21               PT Long Term Goals - 10/01/21 0931       PT LONG TERM GOAL #1   Title Patient will demonstrate she has regained left shoulder function post operatively when compared to baselines.    Time 8    Period Weeks    Status On-going      PT LONG TERM GOAL #2   Title Decrease pain Rt LE by 50-75% allowing patient to stand, walk, move in/out of transitional movements with improved safety and decreased pain    Baseline 50% improvement: 09/21/21.    Time 6    Period Weeks    Status Achieved      PT LONG TERM GOAL #3   Title Improve tissue extensibility Rt hamstrings and calf with patient to demonstrate ~ 80 deg hamstring ROM in supine    Time 6    Period Weeks    Status On-going      PT LONG TERM GOAL #4   Title Patient demonstrates improved gait pattern with rollator with decreased antalgic gait pattern and greater safety/independence    Time 6    Period Weeks    Status On-going      PT LONG TERM GOAL #5   Title Independent in HEP for LE's as indicated    Time 6    Period Weeks    Status On-going                   Plan - 10/18/21 0946     Clinical Impression Statement Patient reports significant increase in Rt leg pain over the past two days. She continues to have tightness through the Rt hamstrings and calf. She sits for the majority of her day keeping the Rt hamstring shortened. Discussed varying positions in sitting and increasing the stretches during the day. Modified and corrected HS sretches to make sure patient achieves full knee extension with stretches. Improved pain in Rt LE with treatment.    Rehab Potential Good    PT Frequency 2x / week    PT Duration 6 weeks    PT Treatment/Interventions ADLs/Self Care Home  Management;Therapeutic exercise;Therapeutic activities;Patient/family education;Manual techniques;Passive range of motion    PT Next Visit Plan continue PROM left shoulder and AAROM exercises.  RLE strengthening and stretches.    PT Home Exercise Plan [email or web address]    Consulted and Agree with Plan of Care Patient             Patient will benefit from skilled therapeutic intervention in order to improve the following deficits and impairments:     Visit Diagnosis: Chronic left shoulder pain  Stiffness of left shoulder, not elsewhere classified  Pain in right leg  Abnormal posture  Other symptoms and signs involving the musculoskeletal system     Problem List Patient Active Problem List   Diagnosis Date Noted   Malignant neoplasm of upper-outer quadrant of left breast in female,  estrogen receptor positive ([place]) 08/27/2021   Abnormal ultrasound of endometrium 04/08/2021    Brooke Duncan Brooke Duncan, PT, MPH  10/18/2021, 10:20 AM  [facility] [place], [place], [address] Phone: [phone]   Fax:  [phone]  Name: Brooke Duncan MRN: [id number] Date of Birth: [birth year]

## 2021-10-19 ENCOUNTER — Telehealth: Payer: Self-pay | Admitting: *Deleted

## 2021-10-19 ENCOUNTER — Encounter: Payer: Self-pay | Admitting: General Practice

## 2021-10-19 NOTE — Telephone Encounter (Signed)
Pt called to discuss MRI bx on 11/18. Pt relate she is still having difficulty with her left shoulder. She is slowly progressing with PT but "not where I need to be". Pt wishes to cancel her MRI bx on 11/18. Pt informed she will not be able to be on the MRI table for 1 hour.  Discussed that it would be helpful for pt to discuss this with Dr. Lindi Adie as she is on AI and to further discuss with her prolonging MRI bx. Scheduled and confirmed appt on 11/17 at 2:45.  Physician team notified. MRI bx cx per pt request.

## 2021-10-19 NOTE — Progress Notes (Signed)
[facility] Spiritual Care Note  Received call from [facility] seeking support regarding anxiety about Friday's scheduled MRI and biopsy, because she had such difficulty with her shoulder after lying on the table for so long during (and then struggling to get up after) her previous procedure.  Provided empathic listening, normalization of feelings, and support in verbalizing her questions so that she can write them down in anticipation of a return call from her nurse navigator. Also shared three relaxation techniques to try/adapt as needed for her comfort (4-7-8 breathing, box breathing, progressive relaxation) for coping with distress in the next couple of days.  We plan to follow up by phone at the beginning of the week for a pastoral check-in.   [place], [place], [facility] Pager [phone] Voicemail [phone]

## 2021-10-20 NOTE — Progress Notes (Signed)
Patient Care Team: Daisy Floro, MD as PCP - General (Family Medicine) Pershing Proud, RN as Oncology Nurse Navigator Donnelly Angelica, RN as Oncology Nurse Navigator Emelia Loron, MD as Consulting Physician (General Surgery) Serena Croissant, MD as Consulting Physician (Hematology and Oncology) Antony Blackbird, MD as Consulting Physician (Radiation Oncology)  DIAGNOSIS:    ICD-10-CM   1. Malignant neoplasm of upper-outer quadrant of left breast in female, estrogen receptor positive (HCC)  C50.412    Z17.0       SUMMARY OF ONCOLOGIC HISTORY: Oncology History  Malignant neoplasm of upper-outer quadrant of left breast in female, estrogen receptor positive (HCC)  08/24/2021 Initial Diagnosis   MRI Breast: a triangular shaped area of enhancement measuring 4.4 x 2.2 x 1.8 cm, a linear area of low level non mass enhancement spanning 5.2 x 1.8 x 0.9 cm, and smaller rounded area of non mass enhancement spanning 2.3 x 2.4 x 1.6 cm in the left breast. 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   08/25/2021 -  Anti-estrogen oral therapy   Neoadjuvant letrozole   09/01/2021 Cancer Staging   Staging form: Breast, AJCC 8th Edition - Clinical stage from 09/01/2021: Stage IIA (cT3, cN0, cM0, G2, ER+, PR+, HER2-) - Signed by Serena Croissant, MD on 09/01/2021 Stage prefix: Initial diagnosis Histologic grading system: 3 grade system      CHIEF COMPLIANT: Follow-up of left breast cancer  INTERVAL HISTORY: Brooke Duncan is a 75 y.o. with above-mentioned history of left breast cancer. She presents to the clinic today for follow-up.  She tells me that she is tolerating letrozole fairly well without any problems or concerns.  Unfortunately she has not had the MRI guided biopsies because she is not able to lift her arm above the shoulder level.  She is also not able to go in MRI machine given the previous experience with the  MRI.  ALLERGIES:  is allergic to tape.  MEDICATIONS:  Current Outpatient Medications  Medication Sig Dispense Refill   acetaminophen (TYLENOL) 500 MG tablet Take 2 tablets (1,000 mg total) by mouth every 6 (six) hours as needed. 30 tablet 0   Cholecalciferol (VITAMIN D3 SUPER STRENGTH) 50 MCG (2000 UT) CAPS Take by mouth daily.     clotrimazole (LOTRIMIN) 1 % cream Apply 1 application topically 2 (two) times daily as needed.     CRANBERRY PO Take 2 capsules by mouth daily.     D-MANNOSE PO Take 4 capsules by mouth daily.     erythromycin ophthalmic ointment 1 application as needed.     hydrocortisone 2.5 % cream Apply topically 2 (two) times daily as needed.     ibuprofen (ADVIL) 600 MG tablet Take 1 tablet (600 mg total) by mouth every 6 (six) hours as needed. 30 tablet 0   letrozole (FEMARA) 2.5 MG tablet Take 1 tablet (2.5 mg total) by mouth daily. 90 tablet 3   levothyroxine (SYNTHROID) 150 MCG tablet Take 150 mcg by mouth daily before breakfast.     losartan (COZAAR) 25 MG tablet Take 25 mg by mouth daily.     nitrofurantoin (MACRODANTIN) 50 MG capsule Take 50 mg by mouth at bedtime.     oxyCODONE-acetaminophen (PERCOCET/ROXICET) 5-325 MG tablet Take 1 tablet by mouth every 8 (eight) hours as needed for severe pain. 6 tablet 0   PRESCRIPTION MEDICATION 4 capsules daily. D-Mannose     simvastatin (ZOCOR) 10 MG tablet Take 10 mg by mouth at bedtime.  vitamin B-12 (CYANOCOBALAMIN) 1000 MCG tablet Take 1,000 mcg by mouth daily.     No current facility-administered medications for this visit.    PHYSICAL EXAMINATION: ECOG PERFORMANCE STATUS: 1 - Symptomatic but completely ambulatory  Vitals:   10/21/21 1442  BP: 135/82  Pulse: 93  Resp: 19  Temp: 97.8 F (36.6 C)  SpO2: 97%   Filed Weights   10/21/21 1442  Weight: 264 lb 1.6 oz (119.8 kg)    BREAST: No palpable masses or nodules in either right or left breasts. No palpable axillary supraclavicular or infraclavicular  adenopathy no breast tenderness or nipple discharge. (exam performed in the presence of a chaperone)  LABORATORY DATA:  I have reviewed the data as listed CMP Latest Ref Rng & Units 09/01/2021 04/06/2021  Glucose 70 - 99 mg/dL 98 81  BUN 8 - 23 mg/dL 13 14  Creatinine 0.44 - 1.00 mg/dL 0.76 0.67  Sodium 135 - 145 mmol/L 140 143  Potassium 3.5 - 5.1 mmol/L 4.4 4.5  Chloride 98 - 111 mmol/L 105 109  CO2 22 - 32 mmol/L 26 29  Calcium 8.9 - 10.3 mg/dL 9.4 9.4  Total Protein 6.5 - 8.1 g/dL 6.9 -  Total Bilirubin 0.3 - 1.2 mg/dL 0.7 -  Alkaline Phos 38 - 126 U/L 79 -  AST 15 - 41 U/L 12(L) -  ALT 0 - 44 U/L 17 -    Lab Results  Component Value Date   WBC 8.1 09/01/2021   HGB 13.7 09/01/2021   HCT 43.5 09/01/2021   MCV 87.9 09/01/2021   PLT 161 09/01/2021   NEUTROABS 6.0 09/01/2021    ASSESSMENT & PLAN:  Malignant neoplasm of upper-outer quadrant of left breast in female, estrogen receptor positive ([place]) 08/24/2021: Screening mammogram revealed left breast asymmetry at 2 o'clock position measuring 1.6 cm.  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  MRI detected 2 additional masses measuring 5.2 cm and 2.4 cm (need biopsied) no lymphadenopathy  Recommendations:  1.  Neoadjuvant antiestrogen therapy with letrozole (until additional biopsies were performed) Breast conserving surgery versus mastectomy with sentinel lymph node biopsy (if the MRI biopsies of the additional lesions are negative then patient will be treated with neoadjuvant antiestrogen therapy and for 6 months followed by lumpectomy) 2. Oncotype DX testing to determine if chemotherapy would be of any benefit followed by 3. Adjuvant radiation therapy followed by 4. Adjuvant antiestrogen  therapy ---------------------------------------------------------------------------------------------------------------------------------------- Patient cannot undergo MRI guided biopsies because of her severe pain in her shoulder and the previous experience with MRI. The alternate treatment plan could be that we can consider doing a lumpectomy on the known breast cancer and monitor the other areas closely. The other alternate would be to do a mastectomy.  Patient would prefer not to do a mastectomy if it can be avoided. I will discussed with Dr. Donne Hazel about what treatment he would recommend.  Since it has been 3 months from previous mammogram we can consider repeating another mammogram in January. She has a lot of family coming into town for holidays and therefore she would prefer to have the surgery done in January.  Once we have discussions with Dr. Donne Hazel, we will plan her treatments accordingly.  No orders of the defined types were placed in this encounter.  The patient has a good understanding of the overall plan. she agrees  with it. she will call with any problems that may develop before the next visit here.  Total time spent: 30 mins including face to face time and time spent for planning, charting and coordination of care  Rulon Eisenmenger, MD, MPH 10/21/2021  I, Thana Ates, am acting as scribe for Dr. Nicholas Lose.  I have reviewed the above documentation for accuracy and completeness, and I agree with the above.

## 2021-10-21 ENCOUNTER — Inpatient Hospital Stay: Payer: Medicare Other | Attending: Hematology and Oncology | Admitting: Hematology and Oncology

## 2021-10-21 ENCOUNTER — Encounter: Payer: Self-pay | Admitting: Physical Therapy

## 2021-10-21 ENCOUNTER — Other Ambulatory Visit: Payer: Self-pay

## 2021-10-21 ENCOUNTER — Ambulatory Visit (INDEPENDENT_AMBULATORY_CARE_PROVIDER_SITE_OTHER): Payer: Medicare Other | Admitting: Physical Therapy

## 2021-10-21 DIAGNOSIS — M79604 Pain in right leg: Secondary | ICD-10-CM | POA: Diagnosis not present

## 2021-10-21 DIAGNOSIS — C50412 Malignant neoplasm of upper-outer quadrant of left female breast: Secondary | ICD-10-CM | POA: Diagnosis present

## 2021-10-21 DIAGNOSIS — M25612 Stiffness of left shoulder, not elsewhere classified: Secondary | ICD-10-CM | POA: Diagnosis not present

## 2021-10-21 DIAGNOSIS — M25512 Pain in left shoulder: Secondary | ICD-10-CM | POA: Diagnosis present

## 2021-10-21 DIAGNOSIS — R293 Abnormal posture: Secondary | ICD-10-CM | POA: Diagnosis not present

## 2021-10-21 DIAGNOSIS — G8929 Other chronic pain: Secondary | ICD-10-CM | POA: Diagnosis not present

## 2021-10-21 DIAGNOSIS — Z170 Estrogen receptor positive status [ER+]: Secondary | ICD-10-CM | POA: Insufficient documentation

## 2021-10-21 NOTE — Assessment & Plan Note (Signed)
08/24/2021: Screening mammogram revealed left breast asymmetry at 2 o'clock position measuring 1.6 cm.  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  MRI detected 2 additional masses measuring 5.2 cm and 2.4 cm (need biopsied) no lymphadenopathy  Recommendations:  1.  Neoadjuvant antiestrogen therapy with letrozole (until additional biopsies were performed) Breast conserving surgery versus mastectomy with sentinel lymph node biopsy (if the MRI biopsies of the additional lesions are negative then patient will be treated with neoadjuvant antiestrogen therapy and for 6 months followed by lumpectomy) 2. Oncotype DX testing to determine if chemotherapy would be of any benefit followed by 3. Adjuvant radiation therapy followed by 4. Adjuvant antiestrogen therapy ---------------------------------------------------------------------------------------------------------------------------------------- Breast MRI has been delayed because of her shoulder issues. Severe osteoarthritis of right knee

## 2021-10-21 NOTE — Therapy (Signed)
[place], [place], [address] Phone: [phone]   Fax:  [phone]  Physical Therapy Treatment  Patient Details  Name: Brooke Duncan MRN: [id number] Date of Birth: 19-Dec-1945 Referring Provider (PT): Dr. Rolm Bookbinder   Encounter Date: 10/21/2021   PT End of Session - 10/21/21 1017     Visit Number 15    Number of Visits 21    Date for PT Re-Evaluation 10/22/21    Authorization - Visit Number 15    Progress Note Due on Visit 20    PT Start Time 0938    PT Stop Time 1016    PT Time Calculation (min) 38 min    Activity Tolerance Patient tolerated treatment well    Behavior During Therapy [facility] for tasks assessed/performed             Past Medical History:  Diagnosis Date   Acquired solitary kidney    right side 2014   Arthritis    Frequency of urination    Heart murmur    per told by pcp approx. 02/ 2022 heard a very faint murmur, told did need work-up done at this time   History of Hashimoto thyroiditis    s/p  total thyroidectomy 1995   History of hypercalcemia    s/p  parathryoidectomy 07/ 2021   History of renal cell carcinoma 2014   s/p  left nephrectomy, pt stated no other treatment   Hypertension    followed by pcp   Hypothyroidism, postsurgical 1995   followed by pcp in [place], moved from [place] 01/ 2021   OSA on CPAP    Thickened endometrium    Wears glasses     Past Surgical History:  Procedure Laterality Date   HYSTEROSCOPY WITH D & C N/A 04/08/2021   Procedure: DILATATION AND CURETTAGE /HYSTEROSCOPY, EXCISION OF VAGINAL LESION;  Surgeon: Delsa Bern, MD;  Location: [facility];  Service: Gynecology;  Laterality: N/A;   LAPAROSCOPIC CHOLECYSTECTOMY  2005   NEPHRECTOMY Left 2014   PARATHYROIDECTOMY  07/ 2021  in [place]  age 75   TOTAL HIP ARTHROPLASTY Bilateral left 2015;  right 2009   TOTAL KNEE ARTHROPLASTY Left 2014    TOTAL THYROIDECTOMY Bilateral 1995    There were no vitals filed for this visit.   Subjective Assessment - 10/21/21 0941     Subjective Pt reports she has cancelled her MRI scheduled for tomorrow. She is concerned about getting into prone position and laying there for 1+hr.  She has Oncologist appt this afternoon to discuss options.    Patient Stated Goals Get the Rt knee better. Get arm moving better to tolerate surgery; prevent lymphedema and learn post op HEP    Currently in Pain? Yes    Pain Score 3     Pain Location Shoulder    Pain Orientation Left    Pain Descriptors / Indicators Tightness;Aching    Aggravating Factors  reaching overhead, reaching behind back    Pain Relieving Factors rest    Pain Score 2    Pain Location Leg    Pain Orientation Right    Pain Descriptors / Indicators Tightness;Tender    Aggravating Factors  walking    Pain Relieving Factors rest                [facility] PT Assessment - 10/21/21 0001       Assessment   Medical Diagnosis Left breast cancer; left  shoulder pain, Rt knee/LE pain    Referring Provider (PT) Dr. Rolm Bookbinder    Onset Date/Surgical Date 06/09/21   08/30/21 injury to Rt leg   Hand Dominance Right    Prior Therapy none               [facility] Adult PT Treatment/Exercise - 10/21/21 0001       Knee/Hip Exercises: Stretches   Passive Hamstring Stretch Right;2 reps;30 seconds   overpressure into ext with pt's hand   Passive Hamstring Stretch Limitations supine with strap    ITB Stretch Right;1 rep;30 seconds    ITB Stretch Limitations supine with strap      Knee/Hip Exercises: Supine   Bridges Strengthening;1 set;5 reps   5 sec hold in ext     Shoulder Exercises: Pulleys   Flexion 3 minutes    ABduction 3 minutes      Shoulder Exercises: ROM/Strengthening   Plank Limitations counter plank down/up from forearms x 2 reps;  hands on table walking forward then onto forearms and back up to elbows straight.       Shoulder Exercises: Stretch   Corner Stretch 2 reps;20 seconds    Star Gazer Stretch 2 reps;60 seconds   hands to top of head   Other Shoulder Stretches Lt bicep brachii stretch holding counter x 15 sec x 2    Other Shoulder Stretches prolonged snow angel ~ 1 min x 2 reps (~70)      Moist Heat Therapy   Number Minutes Moist Heat 12 Minutes   during STM to shoulder   Moist Heat Location Knee   posterior distal thigh/knee     Manual Therapy   Soft tissue mobilization STM to Lt pec major/minor, triceps; deltoid; upper trap, rhomboid, and levator to decrease fascial restrictions and improve mobility.    Passive ROM PROM Lt shoulder flexion; abduction and ER in scapular plane                       PT Short Term Goals - 10/01/21 0931       PT SHORT TERM GOAL #1   Title Patient will be independent in her home exercise program for shoulder ROM.    Time 6    Period Weeks    Status On-going    Target Date 10/22/21      PT SHORT TERM GOAL #2   Title Patient will increase left shoulder flexion to >/= 120 degrees for more functional reach.    Baseline -    Time 6    Period Weeks    Status Achieved    Target Date 10/22/21      PT SHORT TERM GOAL #3   Title Patient will increase left shoulder abduction to >/= 120 degrees to more easily obtain surgical position and reach with greater ease.    Baseline -    Time 6    Period Weeks    Status On-going    Target Date 10/22/21      PT SHORT TERM GOAL #4   Title Patient will improve her DASH score to be </= 40 for improved overall UE function.    Baseline -    Time 6    Period Weeks    Status On-going    Target Date 10/22/21      PT SHORT TERM GOAL #5   Title Patient will report >/= 25% decrease in left shoulder pain with daily tasks.    Time  6    Period Weeks    Status Achieved    Target Date 10/22/21               PT Long Term Goals - 10/01/21 0931       PT LONG TERM GOAL #1   Title Patient will  demonstrate she has regained left shoulder function post operatively when compared to baselines.    Time 8    Period Weeks    Status On-going      PT LONG TERM GOAL #2   Title Decrease pain Rt LE by 50-75% allowing patient to stand, walk, move in/out of transitional movements with improved safety and decreased pain    Baseline 50% improvement: 09/21/21.    Time 6    Period Weeks    Status Achieved      PT LONG TERM GOAL #3   Title Improve tissue extensibility Rt hamstrings and calf with patient to demonstrate ~ 80 deg hamstring ROM in supine    Time 6    Period Weeks    Status On-going      PT LONG TERM GOAL #4   Title Patient demonstrates improved gait pattern with rollator with decreased antalgic gait pattern and greater safety/independence    Time 6    Period Weeks    Status On-going      PT LONG TERM GOAL #5   Title Independent in HEP for LE's as indicated    Time 6    Period Weeks    Status On-going                   Plan - 10/21/21 1107     Clinical Impression Statement Persistant tightness in Lt shoulder; point tender in Lt triceps brachii, pec major and teres major. She was able to bear weight through both arms on lower surface with greater ease than previous attempts.    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  (what's plan for surgery/MRI going forward?)  continue PROM left shoulder and AAROM exercises.  RLE strengthening and stretches.    PT Home Exercise Plan [id number]    Consulted and Agree with Plan of Care Patient             Patient will benefit from  skilled therapeutic intervention in order to improve the following deficits and impairments:  Decreased knowledge of precautions, Impaired UE functional use, Pain, Postural dysfunction, Decreased range of motion, Abnormal gait, Decreased activity tolerance, Impaired flexibility, Decreased mobility, Difficulty walking  Visit Diagnosis: Chronic left shoulder pain  Stiffness of left shoulder, not elsewhere classified  Pain in right leg  Abnormal posture     Problem List Patient Active Problem List   Diagnosis Date Noted   Malignant neoplasm of upper-outer quadrant of left breast in female, estrogen receptor positive ([place]) 08/27/2021   Abnormal ultrasound of endometrium 04/08/2021    Kerin Perna, PTA 10/21/21 12:22 PM  [place], [place], [address] Phone: [phone]   Fax:  [phone]  Name: Brooke Duncan MRN: [id number] Date of Birth: 09-Jul-1946

## 2021-10-22 ENCOUNTER — Other Ambulatory Visit: Payer: Medicare Other

## 2021-10-25 ENCOUNTER — Other Ambulatory Visit: Payer: Self-pay | Admitting: *Deleted

## 2021-10-25 DIAGNOSIS — C50412 Malignant neoplasm of upper-outer quadrant of left female breast: Secondary | ICD-10-CM

## 2021-10-26 ENCOUNTER — Encounter: Payer: Self-pay | Admitting: Rehabilitative and Restorative Service Providers"

## 2021-10-26 ENCOUNTER — Ambulatory Visit (INDEPENDENT_AMBULATORY_CARE_PROVIDER_SITE_OTHER): Payer: Medicare Other | Admitting: Rehabilitative and Restorative Service Providers"

## 2021-10-26 ENCOUNTER — Other Ambulatory Visit: Payer: Self-pay

## 2021-10-26 DIAGNOSIS — M25612 Stiffness of left shoulder, not elsewhere classified: Secondary | ICD-10-CM

## 2021-10-26 DIAGNOSIS — R293 Abnormal posture: Secondary | ICD-10-CM | POA: Diagnosis not present

## 2021-10-26 DIAGNOSIS — M79604 Pain in right leg: Secondary | ICD-10-CM

## 2021-10-26 DIAGNOSIS — R29898 Other symptoms and signs involving the musculoskeletal system: Secondary | ICD-10-CM

## 2021-10-26 DIAGNOSIS — M25512 Pain in left shoulder: Secondary | ICD-10-CM | POA: Diagnosis not present

## 2021-10-26 DIAGNOSIS — G8929 Other chronic pain: Secondary | ICD-10-CM | POA: Diagnosis not present

## 2021-10-26 NOTE — Patient Instructions (Signed)
   Work on standing pushing hips forward and holding  Try this during commercials in a 30 min TV program At least twice a day    Add hamstring stretch sitting with leg in front heel on floor  30 sec hold x 3 reps - several ties a day   Sit with knees bent and straighten right knee in front  Hold 3 sec x 10 reps  About twice a day

## 2021-10-26 NOTE — Therapy (Signed)
[place], [place], [address] Phone: [phone]   Fax:  [phone]  Physical Therapy Treatment  Patient Details  Name: Brooke Duncan MRN: [id number] Date of Birth: December 10, 1945 Referring Provider (PT): Dr. Rolm Bookbinder   Encounter Date: 10/26/2021   PT End of Session - 10/26/21 0937     Visit Number 16    Number of Visits 28    Date for PT Re-Evaluation 12/07/21    Authorization - Visit Number 16    Progress Note Due on Visit 20    PT Start Time 0935    PT Stop Time 1015    PT Time Calculation (min) 40 min    Activity Tolerance Patient tolerated treatment well             Past Medical History:  Diagnosis Date   Acquired solitary kidney    right side 2014   Arthritis    Frequency of urination    Heart murmur    per told by pcp approx. 02/ 2022 heard a very faint murmur, told did need work-up done at this time   History of Hashimoto thyroiditis    s/p  total thyroidectomy 1995   History of hypercalcemia    s/p  parathryoidectomy 07/ 2021   History of renal cell carcinoma 2014   s/p  left nephrectomy, pt stated no other treatment   Hypertension    followed by pcp   Hypothyroidism, postsurgical 1995   followed by pcp in [place], moved from [place] 01/ 2021   OSA on CPAP    Thickened endometrium    Wears glasses     Past Surgical History:  Procedure Laterality Date   HYSTEROSCOPY WITH D & C N/A 04/08/2021   Procedure: DILATATION AND CURETTAGE /HYSTEROSCOPY, EXCISION OF VAGINAL LESION;  Surgeon: Delsa Bern, MD;  Location: [facility];  Service: Gynecology;  Laterality: N/A;   LAPAROSCOPIC CHOLECYSTECTOMY  2005   NEPHRECTOMY Left 2014   PARATHYROIDECTOMY  07/ 2021  in [place]  age 23   TOTAL HIP ARTHROPLASTY Bilateral left 2015;  right 2009   TOTAL KNEE ARTHROPLASTY Left 2014   TOTAL THYROIDECTOMY Bilateral 1995    There were no vitals  filed for this visit.   Subjective Assessment - 10/26/21 0938     Subjective Patient reports that she sw the oncologist last week and will proceed with lumpectomy without further testing. She is waiting for the surgeon's office to call to schedule. Rt leg is worse today. She has more tightness and pain in the hamstring area today. Stretching twice a day. Still doing a lot of sitting.    Currently in Pain? Yes    Pain Score 3     Pain Location Shoulder    Pain Orientation Left    Pain Descriptors / Indicators Tightness;Aching    Pain Type Acute pain;Chronic pain    Pain Score 4    Pain Location Leg    Pain Orientation Right    Pain Descriptors / Indicators Tightness;Sore    Pain Type Acute pain;Chronic pain    Pain Onset More than a month ago    Pain Frequency Intermittent                [facility] PT Assessment - 10/26/21 0001       Assessment   Medical Diagnosis Left breast cancer; left shoulder pain, Rt knee/LE pain    Referring Provider (PT) Dr. Rolm Bookbinder  Onset Date/Surgical Date 06/09/21   08/30/21 injury to Rt leg   Hand Dominance Right    Prior Therapy none      AROM   Right Shoulder Extension 42 Degrees    Right Shoulder Flexion 119 Degrees    Right Shoulder ABduction 118 Degrees    Right Shoulder Internal Rotation 42 Degrees    Right Shoulder External Rotation 69 Degrees    Left Shoulder Extension 40 Degrees    Left Shoulder Flexion 120 Degrees    Left Shoulder ABduction 111 Degrees    Left Shoulder External Rotation 52 Degrees    Right Knee Extension -7    Right Knee Flexion 92    Left Knee Extension 0    Left Knee Flexion 95      PROM   Left Shoulder Flexion 140 Degrees    Left Shoulder ABduction 140 Degrees   in scapular pane   Left Shoulder External Rotation 65 Degrees   in scapular plane     Flexibility   Hamstrings tight Rt ~ 70 deg; Lt 70 deg    ITB tight Rt > Lt    Piriformis tight bilat      Palpation   Palpation comment tight Rt  posterior thigh to calf; Lt shoulder girdle - pecs; upper trap; teres; biceps; triceps; deltoid                           [facility] Adult PT Treatment/Exercise - 10/26/21 0001       Knee/Hip Exercises: Stretches   Active Hamstring Stretch Right;3 reps;30 seconds    Active Hamstring Stretch Limitations seated    Passive Hamstring Stretch Right;2 reps;30 seconds    Passive Hamstring Stretch Limitations supine with strap    ITB Stretch Right;2 reps;30 seconds    ITB Stretch Limitations supine with strap    Other Knee/Hip Stretches hip adductor/HS stretch supine with strap 30 sec x 2 reps      Knee/Hip Exercises: Standing   Other Standing Knee Exercises working on standing posture pushing hips forward to stretch hamstrings standing 1-2 min      Knee/Hip Exercises: Seated   Long Arc Quad AROM;Strengthening;Right;10 reps      Knee/Hip Exercises: Supine   Quad Sets Strengthening;Right;10 reps    Quad Sets Limitations 5 sec hold    Straight Leg Raises Strengthening;Right;5 reps    Straight Leg Raises Limitations 5 sec hold ~ 10 inch lift      Shoulder Exercises: Supine   Flexion AROM;AAROM;Both;10 reps    Flexion Limitations holding stretch out strap both hands    Diagonals AROM;Strengthening;Left;10 reps;Theraband    Theraband Level (Shoulder Diagonals) Level 2 (Red)      Manual Therapy   Soft tissue mobilization STM to Lt pec major/minor, triceps; deltoid; upper trap, rhomboid, and levator to decrease fascial restrictions and improve mobility.    Passive ROM PROM Lt shoulder flexion; abduction and ER in scapular plane                     PT Education - 10/26/21 1011     Education Details HEP    Person(s) Educated Patient    Methods Explanation;Demonstration;Tactile cues;Verbal cues;Handout    Comprehension Verbalized understanding;Returned demonstration;Verbal cues required;Tactile cues required              PT Short Term Goals - 10/01/21 0931        PT SHORT TERM GOAL #1  Title Patient will be independent in her home exercise program for shoulder ROM.    Time 6    Period Weeks    Status On-going    Target Date 10/22/21      PT SHORT TERM GOAL #2   Title Patient will increase left shoulder flexion to >/= 120 degrees for more functional reach.    Baseline -    Time 6    Period Weeks    Status Achieved    Target Date 10/22/21      PT SHORT TERM GOAL #3   Title Patient will increase left shoulder abduction to >/= 120 degrees to more easily obtain surgical position and reach with greater ease.    Baseline -    Time 6    Period Weeks    Status On-going    Target Date 10/22/21      PT SHORT TERM GOAL #4   Title Patient will improve her DASH score to be </= 40 for improved overall UE function.    Baseline -    Time 6    Period Weeks    Status On-going    Target Date 10/22/21      PT SHORT TERM GOAL #5   Title Patient will report >/= 25% decrease in left shoulder pain with daily tasks.    Time 6    Period Weeks    Status Achieved    Target Date 10/22/21               PT Long Term Goals - 10/26/21 0957       PT LONG TERM GOAL #1   Title Patient will demonstrate she has regained left shoulder function post operatively when compared to baselines.    Time 6    Period Weeks    Status On-going    Target Date 12/07/21      PT LONG TERM GOAL #2   Title Decrease pain Rt LE by 75% allowing patient to stand, walk, move in/out of transitional movements with improved safety and decreased pain    Baseline 50% improvement    Time 6    Period Weeks    Status On-going    Target Date 12/07/21      PT LONG TERM GOAL #3   Title Improve tissue extensibility Rt hamstrings and calf with patient to demonstrate ~ 80 deg hamstring ROM in supine    Time 6    Period Weeks    Status On-going    Target Date 12/07/21      PT LONG TERM GOAL #4   Title Patient demonstrates improved gait pattern with rollator with decreased  antalgic gait pattern and greater safety/independence    Time 6    Period Weeks    Status On-going    Target Date 12/07/21      PT LONG TERM GOAL #5   Title Independent in HEP for LE's as indicated    Time 6    Period Weeks    Status On-going    Target Date 12/07/21                   Plan - 10/26/21 0943     Clinical Impression Statement Continued pain in the Rt knee and hamstring musculature. Patient will increase the stretching doing some HS stretches in sitting during the day. Continued tightness in the Lt shoulder. Patient is using the Lt UE for most functional activities. She continues to be limited in the height  she can reach. Working with patient on functional stretch for LE's in sitting and standing. Improvement in Rt LE pain following treatment. Continue treatment to address Lt shoulder function and Rt LE pain/tightness. Patient will benefit from continued treatment to achieve goals of therapy and reach maximum rehab potential.    Rehab Potential Good    PT Frequency 2x / week    PT Duration 6 weeks    PT Treatment/Interventions ADLs/Self Care Home Management;Therapeutic exercise;Therapeutic activities;Patient/family education;Manual techniques;Passive range of motion    PT Next Visit Plan continue PROM left shoulder and AAROM exercises; strengthening;  RLE strengthening and stretches    PT Home Exercise Plan [email or web address]    Consulted and Agree with Plan of Care Patient    Family Member Consulted niece             Patient will benefit from skilled therapeutic intervention in order to improve the following deficits and impairments:     Visit Diagnosis: Chronic left shoulder pain  Stiffness of left shoulder, not elsewhere classified  Pain in right leg  Abnormal posture  Other symptoms and signs involving the musculoskeletal system     Problem List Patient Active Problem List   Diagnosis Date Noted   Malignant neoplasm of upper-outer quadrant of left  breast in female, estrogen receptor positive ([place]) 08/27/2021   Abnormal ultrasound of endometrium 04/08/2021    Eiliana Drone Nilda Simmer, PT, MPH 10/26/2021, 10:14 AM  [facility] [place], [place], [address] Phone: [phone]   Fax:  [phone]  Name: Telma Pyeatt MRN: [id number] Date of Birth: December 29, 1945

## 2021-10-27 ENCOUNTER — Encounter: Payer: Self-pay | Admitting: *Deleted

## 2021-10-27 ENCOUNTER — Telehealth: Payer: Self-pay | Admitting: *Deleted

## 2021-10-27 NOTE — Telephone Encounter (Signed)
Spoke with patient to inform her that she get mammo/us in 1/4 to re-evaluate response and then see Dr. Donne Hazel and Dr. Lindi Adie after to discuss. Patient verbalized understanding. Confirmed appt with dr. Lindi Adie for 1/9 at 1015am.

## 2021-11-03 ENCOUNTER — Encounter: Payer: Self-pay | Admitting: Rehabilitative and Restorative Service Providers"

## 2021-11-03 ENCOUNTER — Ambulatory Visit (INDEPENDENT_AMBULATORY_CARE_PROVIDER_SITE_OTHER): Payer: Medicare Other | Admitting: Rehabilitative and Restorative Service Providers"

## 2021-11-03 ENCOUNTER — Other Ambulatory Visit: Payer: Self-pay

## 2021-11-03 DIAGNOSIS — R29898 Other symptoms and signs involving the musculoskeletal system: Secondary | ICD-10-CM | POA: Diagnosis not present

## 2021-11-03 DIAGNOSIS — M79604 Pain in right leg: Secondary | ICD-10-CM | POA: Diagnosis not present

## 2021-11-03 DIAGNOSIS — M25512 Pain in left shoulder: Secondary | ICD-10-CM

## 2021-11-03 DIAGNOSIS — M25612 Stiffness of left shoulder, not elsewhere classified: Secondary | ICD-10-CM

## 2021-11-03 DIAGNOSIS — G8929 Other chronic pain: Secondary | ICD-10-CM

## 2021-11-03 DIAGNOSIS — R293 Abnormal posture: Secondary | ICD-10-CM | POA: Diagnosis not present

## 2021-11-03 NOTE — Therapy (Signed)
[place], [place], [address] Phone: [phone]   Fax:  [phone]  Physical Therapy Treatment  Patient Details  Name: Brooke Duncan MRN: [id number] Date of Birth: [birth year] Referring Provider (PT): Dr. Rolm Bookbinder   Encounter Date: 11/03/2021   PT End of Session - 11/03/21 0935     Visit Number 17    Number of Visits 28    Date for PT Re-Evaluation 12/07/21    Authorization - Visit Number 17    Progress Note Due on Visit 20    PT Start Time 1324    PT Stop Time 1021    PT Time Calculation (min) 47 min    Activity Tolerance Patient tolerated treatment well             Past Medical History:  Diagnosis Date   Acquired solitary kidney    right side 2014   Arthritis    Frequency of urination    Heart murmur    per told by pcp approx. 02/ 2022 heard a very faint murmur, told did need work-up done at this time   History of Hashimoto thyroiditis    s/p  total thyroidectomy 1995   History of hypercalcemia    s/p  parathryoidectomy 07/ 2021   History of renal cell carcinoma 2014   s/p  left nephrectomy, pt stated no other treatment   Hypertension    followed by pcp   Hypothyroidism, postsurgical 1995   followed by pcp in [place], moved from [place] 01/ 2021   OSA on CPAP    Thickened endometrium    Wears glasses     Past Surgical History:  Procedure Laterality Date   HYSTEROSCOPY WITH D & C N/A 04/08/2021   Procedure: DILATATION AND CURETTAGE /HYSTEROSCOPY, EXCISION OF VAGINAL LESION;  Surgeon: Delsa Bern, MD;  Location: [facility];  Service: Gynecology;  Laterality: N/A;   LAPAROSCOPIC CHOLECYSTECTOMY  2005   NEPHRECTOMY Left 2014   PARATHYROIDECTOMY  07/ 2021  in [place]  age 75   TOTAL HIP ARTHROPLASTY Bilateral left 2015;  right 2009   TOTAL KNEE ARTHROPLASTY Left 2014   TOTAL THYROIDECTOMY Bilateral 1995    There were no vitals  filed for this visit.   Subjective Assessment - 11/03/21 0935     Subjective Leg and knee are hurting. Did a little bit of stretching this morning. Stretching at least 4 times a day. Still sitting a lot during the day. Shoulder still hurts. She is using Lt UE for more functional activities at home.    Currently in Pain? Yes    Pain Score 3     Pain Location Leg    Pain Orientation Left    Pain Descriptors / Indicators Aching;Tightness    Pain Type Acute pain;Chronic pain    Pain Score 3    Pain Location Leg    Pain Orientation Right    Pain Descriptors / Indicators Tightness;Sore    Pain Type Acute pain;Chronic pain    Pain Onset More than a month ago    Pain Frequency Intermittent                               [facility] Adult PT Treatment/Exercise - 11/03/21 0001       Knee/Hip Exercises: Stretches   Active Hamstring Stretch Right;3 reps;30 seconds    Active Hamstring Stretch Limitations seated; heel  propped on stool for last two reps      Knee/Hip Exercises: Standing   Hip Extension AROM;Stengthening;Right;10 reps;Knee straight    Extension Limitations UE support on caounter as needed    Functional Squat 10 reps    SLS 10 sec Lt; painful trial on Rt    Gait Training side stepping at counter x 8 ft Rt/Lt x 5 reps.  forward then retro gait with hand on counter x 8 ft x 5    Other Standing Knee Exercises working on standing posture pushing hips forward to stretch hamstrings standing 1 min x 5 reps sitting to rest btn reps - working to decrease use of UE's with standing - adding UE movement last 3 reps (arms overhead, arms out to side, punching forward)      Shoulder Exercises: Pulleys   Flexion Limitations flexion 10 sec hold x 10 reps    Scaption Limitations 10 sec hold x 10 reps      Shoulder Exercises: ROM/Strengthening   Nustep L5 x 7 min U/LE's    Other ROM/Strengthening Exercises sitting UE active movement - reaching up; out to side and clapping in  front; punching forward; elbows chest level pulling back; small shd circles; larger shoulder circles x 10 each                     PT Education - 11/03/21 1015     Education Details HEP    Person(s) Educated Patient    Methods Explanation;Demonstration;Tactile cues;Verbal cues;Handout    Comprehension Verbalized understanding;Returned demonstration;Verbal cues required              PT Short Term Goals - 10/01/21 0931       PT SHORT TERM GOAL #1   Title Patient will be independent in her home exercise program for shoulder ROM.    Time 6    Period Weeks    Status On-going    Target Date 10/22/21      PT SHORT TERM GOAL #2   Title Patient will increase left shoulder flexion to >/= 120 degrees for more functional reach.    Baseline -    Time 6    Period Weeks    Status Achieved    Target Date 10/22/21      PT SHORT TERM GOAL #3   Title Patient will increase left shoulder abduction to >/= 120 degrees to more easily obtain surgical position and reach with greater ease.    Baseline -    Time 6    Period Weeks    Status On-going    Target Date 10/22/21      PT SHORT TERM GOAL #4   Title Patient will improve her DASH score to be </= 40 for improved overall UE function.    Baseline -    Time 6    Period Weeks    Status On-going    Target Date 10/22/21      PT SHORT TERM GOAL #5   Title Patient will report >/= 25% decrease in left shoulder pain with daily tasks.    Time 6    Period Weeks    Status Achieved    Target Date 10/22/21               PT Long Term Goals - 10/26/21 0957       PT LONG TERM GOAL #1   Title Patient will demonstrate she has regained left shoulder function post operatively when compared to  baselines.    Time 6    Period Weeks    Status On-going    Target Date 12/07/21      PT LONG TERM GOAL #2   Title Decrease pain Rt LE by 75% allowing patient to stand, walk, move in/out of transitional movements with improved safety and  decreased pain    Baseline 50% improvement    Time 6    Period Weeks    Status On-going    Target Date 12/07/21      PT LONG TERM GOAL #3   Title Improve tissue extensibility Rt hamstrings and calf with patient to demonstrate ~ 80 deg hamstring ROM in supine    Time 6    Period Weeks    Status On-going    Target Date 12/07/21      PT LONG TERM GOAL #4   Title Patient demonstrates improved gait pattern with rollator with decreased antalgic gait pattern and greater safety/independence    Time 6    Period Weeks    Status On-going    Target Date 12/07/21      PT LONG TERM GOAL #5   Title Independent in HEP for LE's as indicated    Time 6    Period Weeks    Status On-going    Target Date 12/07/21                   Plan - 11/03/21 0941     Clinical Impression Statement Persistent pain in the Lt shoulder and Rt LE. Working on exercises at home and using [place] for more functional activities. Encouraged consistent exercises for home and increasing functional activity including use of Lt UE.    Rehab Potential Good    PT Frequency 2x / week    PT Duration 6 weeks    PT Treatment/Interventions ADLs/Self Care Home Management;Therapeutic exercise;Therapeutic activities;Patient/family education;Manual techniques;Passive range of motion    PT Next Visit Plan continue PROM left shoulder and AAROM exercises; strengthening;  RLE strengthening and stretches    PT Home Exercise Plan [email or web address]    Consulted and Agree with Plan of Care Patient             Patient will benefit from skilled therapeutic intervention in order to improve the following deficits and impairments:     Visit Diagnosis: Chronic left shoulder pain  Stiffness of left shoulder, not elsewhere classified  Pain in right leg  Abnormal posture  Other symptoms and signs involving the musculoskeletal system     Problem List Patient Active Problem List   Diagnosis Date Noted   Malignant neoplasm of  upper-outer quadrant of left breast in female, estrogen receptor positive ([place]) 08/27/2021   Abnormal ultrasound of endometrium 04/08/2021    Jonnette Nuon Nilda Simmer, PT, MPH  11/03/2021, 10:31 AM  [facility] [place], [place], [address] Phone: [phone]   Fax:  [phone]  Name: Michala Deblanc MRN: [id number] Date of Birth: 31-Jul-1946

## 2021-11-03 NOTE — Patient Instructions (Addendum)
Access Code: [email or web address] URL: [email or web address] Date: 11/03/2021 Prepared by: Gillermo Murdoch  Exercises Shoulder External Rotation and Scapular Retraction with Resistance - 2 x daily - 7 x weekly - 3 sets - 10 reps Scapular Retraction with Resistance - 2 x daily - 7 x weekly - 3 sets - 10 reps Scapular Retraction with Resistance Advanced - 2 x daily - 7 x weekly - 3 sets - 10 reps Wall Push Up - 2 x daily - 7 x weekly - 1 sets - 10 reps - 3 sec hold Hooklying Hamstring Stretch with Strap - 2 x daily - 7 x weekly - 1 sets - 3 reps - 30 sec hold Seated Hamstring Stretch - 2 x daily - 7 x weekly - 1 sets - 3 reps - 30 sec hold Supine ITB Stretch with Strap - 2 x daily - 7 x weekly - 1 sets - 3 reps - 30 sec hold Hip Adductors and Hamstring Stretch with Strap - 2 x daily - 7 x weekly - 1 sets - 3 reps - 30 sec hold Supine Quad Set - 2 x daily - 7 x weekly - 1 sets - 10 reps - 3-5 sec hold Active Straight Leg Raise with Quad Set - 2 x daily - 7 x weekly - 1 sets - 10 reps - 3 sec hold Side Stepping with Counter Support Backward Walking with Counter Support Mini Squat - 1 x daily - 7 x weekly - 1-2 sets - 10 reps - 2-3 sec hold Standing Heel Raise Standing working on trunk and LE extension with various UE movements

## 2021-11-05 ENCOUNTER — Other Ambulatory Visit: Payer: Self-pay

## 2021-11-05 ENCOUNTER — Ambulatory Visit (INDEPENDENT_AMBULATORY_CARE_PROVIDER_SITE_OTHER): Payer: Medicare Other | Admitting: Physical Therapy

## 2021-11-05 DIAGNOSIS — M79604 Pain in right leg: Secondary | ICD-10-CM

## 2021-11-05 DIAGNOSIS — R293 Abnormal posture: Secondary | ICD-10-CM

## 2021-11-05 DIAGNOSIS — G8929 Other chronic pain: Secondary | ICD-10-CM | POA: Diagnosis not present

## 2021-11-05 DIAGNOSIS — M25512 Pain in left shoulder: Secondary | ICD-10-CM

## 2021-11-05 DIAGNOSIS — R29898 Other symptoms and signs involving the musculoskeletal system: Secondary | ICD-10-CM

## 2021-11-05 DIAGNOSIS — M25612 Stiffness of left shoulder, not elsewhere classified: Secondary | ICD-10-CM | POA: Diagnosis not present

## 2021-11-05 NOTE — Therapy (Signed)
[place], [place], [address] Phone: [phone]   Fax:  [phone]  Physical Therapy Treatment  Patient Details  Name: Brooke Duncan MRN: [id number] Date of Birth: [birth year] Referring Provider (PT): Dr. Rolm Bookbinder   Encounter Date: 11/05/2021   PT End of Session - 11/05/21 0852     Visit Number 18    Number of Visits 28    Date for PT Re-Evaluation 12/07/21    Authorization - Visit Number 18    Progress Note Due on Visit 20    PT Start Time 0849    PT Stop Time 0932    PT Time Calculation (min) 43 min    Activity Tolerance Patient tolerated treatment well    Behavior During Therapy [facility] for tasks assessed/performed             Past Medical History:  Diagnosis Date   Acquired solitary kidney    right side 2014   Arthritis    Frequency of urination    Heart murmur    per told by pcp approx. 02/ 2022 heard a very faint murmur, told did need work-up done at this time   History of Hashimoto thyroiditis    s/p  total thyroidectomy 1995   History of hypercalcemia    s/p  parathryoidectomy 07/ 2021   History of renal cell carcinoma 2014   s/p  left nephrectomy, pt stated no other treatment   Hypertension    followed by pcp   Hypothyroidism, postsurgical 1995   followed by pcp in [place], moved from [place] 01/ 2021   OSA on CPAP    Thickened endometrium    Wears glasses     Past Surgical History:  Procedure Laterality Date   HYSTEROSCOPY WITH D & C N/A 04/08/2021   Procedure: DILATATION AND CURETTAGE /HYSTEROSCOPY, EXCISION OF VAGINAL LESION;  Surgeon: Delsa Bern, MD;  Location: [facility];  Service: Gynecology;  Laterality: N/A;   LAPAROSCOPIC CHOLECYSTECTOMY  2005   NEPHRECTOMY Left 2014   PARATHYROIDECTOMY  07/ 2021  in [place]  age 32   TOTAL HIP ARTHROPLASTY Bilateral left 2015;  right 2009   TOTAL KNEE ARTHROPLASTY Left 2014    TOTAL THYROIDECTOMY Bilateral 1995    There were no vitals filed for this visit.   Subjective Assessment - 11/05/21 0909     Subjective Pt reports that her Lt shoulder is sore from previous session. She states she is using her pulleys 4x/day.    Patient Stated Goals Get the Rt knee better. Get arm moving better to tolerate surgery; prevent lymphedema and learn post op HEP    Currently in Pain? Yes    Pain Score 2     Pain Location Generalized    Pain Descriptors / Indicators Sore;Aching    Aggravating Factors  walking, overhead reaching    Pain Relieving Factors rest, ice                [facility] PT Assessment - 11/05/21 0001       Assessment   Medical Diagnosis Left breast cancer; left shoulder pain, Rt knee/LE pain    Referring Provider (PT) Dr. Rolm Bookbinder    Onset Date/Surgical Date 06/09/21   08/30/21 injury to Rt leg   Hand Dominance Right    Prior Therapy none              [facility] Adult PT Treatment/Exercise - 11/05/21 0001  Ambulation/Gait   Ambulation Distance (Feet) 80 Feet x 2   Assistive device Straight cane    Gait Pattern Step-to pattern;Step-through pattern;Decreased stance time - right;Decreased step length - left;Antalgic;Decreased weight shift to right    Ambulation Surface Level;Indoor    Gait Comments cues for even weight shift/ stance time and to increase step length on LLE.      Elbow Exercises   Elbow Flexion Strengthening;Left;Right;5 reps   4 sets     Knee/Hip Exercises: Stretches   Active Hamstring Stretch Right;2 reps;Left;1 rep;30 seconds    Active Hamstring Stretch Limitations seated with hip hinge      Knee/Hip Exercises: Standing   Heel Raises Both;1 set;10 reps    Hip Extension AROM;Stengthening;Right;Knee straight;Left;10 reps    Extension Limitations UE support on counter as needed    Functional Squat 2 sets;5 reps    Gait Training side stepping at counter x 8 ft Rt/Lt x 6 reps with placing cones on overhead shelf with  LUE. high knee marching then retro gait with hand on counter x 8 ft x 6    Other Standing Knee Exercises standing with forward punches x 15 (at shoulder height) working on posture and even wt  between LEs.    Other Standing Knee Exercises weight shifts Rt/Lt prior to gait trials with UE on counter.      Shoulder Exercises: Standing   Row Both;15 reps    Theraband Level (Shoulder Row) Level 3 (Green)      Shoulder Exercises: Pulleys   Flexion Limitations 20 sec hold x 6 reps    Scaption Limitations 20 sec hold x 5 reps      Shoulder Exercises: ROM/Strengthening   Nustep L5 x 6 min U/LE's                       PT Short Term Goals - 10/01/21 0931       PT SHORT TERM GOAL #1   Title Patient will be independent in her home exercise program for shoulder ROM.    Time 6    Period Weeks    Status On-going    Target Date 10/22/21      PT SHORT TERM GOAL #2   Title Patient will increase left shoulder flexion to >/= 120 degrees for more functional reach.    Baseline -    Time 6    Period Weeks    Status Achieved    Target Date 10/22/21      PT SHORT TERM GOAL #3   Title Patient will increase left shoulder abduction to >/= 120 degrees to more easily obtain surgical position and reach with greater ease.    Baseline -    Time 6    Period Weeks    Status On-going    Target Date 10/22/21      PT SHORT TERM GOAL #4   Title Patient will improve her DASH score to be </= 40 for improved overall UE function.    Baseline -    Time 6    Period Weeks    Status On-going    Target Date 10/22/21      PT SHORT TERM GOAL #5   Title Patient will report >/= 25% decrease in left shoulder pain with daily tasks.    Time 6    Period Weeks    Status Achieved    Target Date 10/22/21  PT Long Term Goals - 10/26/21 0957       PT LONG TERM GOAL #1   Title Patient will demonstrate she has regained left shoulder function post operatively when compared to baselines.     Time 6    Period Weeks    Status On-going    Target Date 12/07/21      PT LONG TERM GOAL #2   Title Decrease pain Rt LE by 75% allowing patient to stand, walk, move in/out of transitional movements with improved safety and decreased pain    Baseline 50% improvement    Time 6    Period Weeks    Status On-going    Target Date 12/07/21      PT LONG TERM GOAL #3   Title Improve tissue extensibility Rt hamstrings and calf with patient to demonstrate ~ 80 deg hamstring ROM in supine    Time 6    Period Weeks    Status On-going    Target Date 12/07/21      PT LONG TERM GOAL #4   Title Patient demonstrates improved gait pattern with rollator with decreased antalgic gait pattern and greater safety/independence    Time 6    Period Weeks    Status On-going    Target Date 12/07/21      PT LONG TERM GOAL #5   Title Independent in HEP for LE's as indicated    Time 6    Period Weeks    Status On-going    Target Date 12/07/21                   Plan - 11/05/21 1058     Clinical Impression Statement Pt tolerated standing activities well when she completed some gentle weight shifts prior; no increase in Rt knee pain during session.  She required occasional short seated rest breaks.  She tolerated increased resistance without difficulty.  Progressing well towards remaining LTGs.    Rehab Potential Good    PT Frequency 2x / week    PT Duration 6 weeks    PT Treatment/Interventions ADLs/Self Care Home Management;Therapeutic exercise;Therapeutic activities;Patient/family education;Manual techniques;Passive range of motion    PT Next Visit Plan continue PROM left shoulder and AAROM exercises; strengthening;  RLE strengthening and stretches    PT Home Exercise Plan [id number]    Consulted and Agree with Plan of Care Patient             Patient will benefit from skilled therapeutic intervention in order to improve the following deficits and impairments:  Decreased knowledge of  precautions, Impaired UE functional use, Pain, Postural dysfunction, Decreased range of motion, Abnormal gait, Decreased activity tolerance, Impaired flexibility, Decreased mobility, Difficulty walking  Visit Diagnosis: Chronic left shoulder pain  Stiffness of left shoulder, not elsewhere classified  Pain in right leg  Abnormal posture  Other symptoms and signs involving the musculoskeletal system     Problem List Patient Active Problem List   Diagnosis Date Noted   Malignant neoplasm of upper-outer quadrant of left breast in female, estrogen receptor positive ([place]) 08/27/2021   Abnormal ultrasound of endometrium 04/08/2021   Kerin Perna, PTA 11/05/21 1:43 PM   [place], [place], [address] Phone: [phone]   Fax:  [phone]  Name: Nataya Bastedo MRN: [id number] Date of Birth: 20-Mar-1946

## 2021-11-09 ENCOUNTER — Other Ambulatory Visit: Payer: Self-pay

## 2021-11-09 ENCOUNTER — Ambulatory Visit (INDEPENDENT_AMBULATORY_CARE_PROVIDER_SITE_OTHER): Payer: Medicare Other | Admitting: Physical Therapy

## 2021-11-09 DIAGNOSIS — M25512 Pain in left shoulder: Secondary | ICD-10-CM | POA: Diagnosis present

## 2021-11-09 DIAGNOSIS — G8929 Other chronic pain: Secondary | ICD-10-CM | POA: Diagnosis not present

## 2021-11-09 DIAGNOSIS — M25612 Stiffness of left shoulder, not elsewhere classified: Secondary | ICD-10-CM | POA: Diagnosis not present

## 2021-11-09 DIAGNOSIS — R293 Abnormal posture: Secondary | ICD-10-CM

## 2021-11-09 DIAGNOSIS — R29898 Other symptoms and signs involving the musculoskeletal system: Secondary | ICD-10-CM | POA: Diagnosis not present

## 2021-11-09 DIAGNOSIS — M79604 Pain in right leg: Secondary | ICD-10-CM

## 2021-11-09 NOTE — Therapy (Signed)
[place], [place], [address] Phone: [phone]   Fax:  [phone]  Physical Therapy Treatment  Patient Details  Name: Brooke Duncan MRN: [id number] Date of Birth: 1945-12-13 Referring Provider (PT): Dr. Rolm Bookbinder   Encounter Date: 11/09/2021   PT End of Session - 11/09/21 0856     Visit Number 19    Number of Visits 28    Date for PT Re-Evaluation 12/07/21    Authorization - Visit Number 19    Progress Note Due on Visit 20    PT Start Time 0850    PT Stop Time 0929    PT Time Calculation (min) 39 min    Activity Tolerance Patient tolerated treatment well    Behavior During Therapy [facility] for tasks assessed/performed             Past Medical History:  Diagnosis Date   Acquired solitary kidney    right side 2014   Arthritis    Frequency of urination    Heart murmur    per told by pcp approx. 02/ 2022 heard a very faint murmur, told did need work-up done at this time   History of Hashimoto thyroiditis    s/p  total thyroidectomy 1995   History of hypercalcemia    s/p  parathryoidectomy 07/ 2021   History of renal cell carcinoma 2014   s/p  left nephrectomy, pt stated no other treatment   Hypertension    followed by pcp   Hypothyroidism, postsurgical 1995   followed by pcp in [place], moved from [place] 01/ 2021   OSA on CPAP    Thickened endometrium    Wears glasses     Past Surgical History:  Procedure Laterality Date   HYSTEROSCOPY WITH D & C N/A 04/08/2021   Procedure: DILATATION AND CURETTAGE /HYSTEROSCOPY, EXCISION OF VAGINAL LESION;  Surgeon: Delsa Bern, MD;  Location: [facility];  Service: Gynecology;  Laterality: N/A;   LAPAROSCOPIC CHOLECYSTECTOMY  2005   NEPHRECTOMY Left 2014   PARATHYROIDECTOMY  07/ 2021  in [place]  age 75   TOTAL HIP ARTHROPLASTY Bilateral left 2015;  right 2009   TOTAL KNEE ARTHROPLASTY Left 2014    TOTAL THYROIDECTOMY Bilateral 1995    There were no vitals filed for this visit.   Subjective Assessment - 11/09/21 0854     Subjective Pt reports no new changes since last visit. She states she doesn't have a cane at home, only the rollator.  She has a repeat mammo and [place] 12/08/21 and has follow up with surgeon on 12/10/21 and oncologist 12/13/21.    Patient Stated Goals Get the Rt knee better. Get arm moving better to tolerate surgery; prevent lymphedema and learn post op HEP    Currently in Pain? Yes    Pain Score 2     Pain Location Generalized    Pain Descriptors / Indicators Aching;Dull                [facility] PT Assessment - 11/09/21 0001       Assessment   Medical Diagnosis Left breast cancer; left shoulder pain, Rt knee/LE pain    Referring Provider (PT) Dr. Rolm Bookbinder    Onset Date/Surgical Date 06/09/21   08/30/21 injury to Rt leg   Hand Dominance Right    Prior Therapy none      Flexibility   Hamstrings RLE:  75  Katina Dung - 11/09/21 0001     Open a tight or new jar Moderate difficulty    Do heavy household chores (wash walls, wash floors) Severe difficulty    Carry a shopping bag or briefcase Moderate difficulty    Wash your back Moderate difficulty    Use a knife to cut food No difficulty    Recreational activities in which you take some force or impact through your arm, shoulder, or hand (golf, hammering, tennis) Mild difficulty    During the past week, to what extent has your arm, shoulder or hand problem interfered with your normal social activities with family, friends, neighbors, or groups? Slightly    During the past week, to what extent has your arm, shoulder or hand problem limited your work or other regular daily activities Slightly    Arm, shoulder, or hand pain. Mild    Tingling (pins and needles) in your arm, shoulder, or hand Mild    Difficulty Sleeping Mild difficulty    DASH Score 34.09 %             [facility] Adult PT  Treatment/Exercise - 11/09/21 0001       Bed Mobility   Bed Mobility Rolling Right    Rolling Right Contact Guard/Touching assist   5 reps to Rt sidelying, with 3 attempts to roll to prone.     Ambulation/Gait   Ambulation Distance (Feet) 40 Feet    Assistive device Straight cane    Gait Pattern Step-through pattern;Decreased stance time - right;Decreased hip/knee flexion - right;Antalgic    Ambulation Surface Level;Indoor    Gait Comments cues for even weight shift and placement of cane closer to body.      Knee/Hip Exercises: Stretches   Passive Hamstring Stretch Right;3 reps;Left;1 rep;30 seconds   supine with strap     Knee/Hip Exercises: Standing   Heel Raises Both;1 set;10 reps    Functional Squat 1 set;10 reps   mini, with UE on counter   Other Standing Knee Exercises R/L hip abdct x 10      Knee/Hip Exercises: Supine   Bridges Strengthening;1 set;10 reps   3 sec hold in extension     Shoulder Exercises: Standing   Extension Strengthening;Both;10 reps    Theraband Level (Shoulder Extension) Level 2 (Red)    Row Both;15 reps    Theraband Level (Shoulder Row) Level 3 (Green)      Shoulder Exercises: Pulleys   Flexion Limitations 10-20 sec holds x 10    Scaption Limitations 10-20 sec holds x 5 reps      Shoulder Exercises: ROM/Strengthening   Nustep L5 x 5 min U/LE's    Wall Pushups 10 reps      Shoulder Exercises: Stretch   Other Shoulder Stretches Lt bicep brachii stretch holding counter x 15 sec x 2                       PT Short Term Goals - 11/09/21 0940       PT SHORT TERM GOAL #1   Title Patient will be independent in her home exercise program for shoulder ROM.    Time 6    Period Weeks    Status On-going    Target Date 10/22/21      PT SHORT TERM GOAL #2   Title Patient will increase left shoulder flexion to >/= 120 degrees for more functional reach.    Baseline -    Time 6  Period Weeks    Status Achieved    Target Date 10/22/21       PT SHORT TERM GOAL #3   Title Patient will increase left shoulder abduction to >/= 120 degrees to more easily obtain surgical position and reach with greater ease.    Baseline 100 :11/09/21    Time 6    Period Weeks    Status On-going    Target Date 10/22/21      PT SHORT TERM GOAL #4   Title Patient will improve her DASH score to be </= 40 for improved overall UE function.    Baseline -    Time 6    Period Weeks    Status Achieved    Target Date 10/22/21      PT SHORT TERM GOAL #5   Title Patient will report >/= 25% decrease in left shoulder pain with daily tasks.    Time 6    Period Weeks    Status Achieved    Target Date 10/22/21               PT Long Term Goals - 11/09/21 0941       PT LONG TERM GOAL #1   Title Patient will demonstrate she has regained left shoulder function post operatively when compared to baselines.    Time 6    Period Weeks    Status On-going    Target Date 12/07/21      PT LONG TERM GOAL #2   Title Decrease pain Rt LE by 75% allowing patient to stand, walk, move in/out of transitional movements with improved safety and decreased pain    Baseline 50% improvement    Time 6    Period Weeks    Status Achieved    Target Date 12/07/21      PT LONG TERM GOAL #3   Title Improve tissue extensibility Rt hamstrings and calf with patient to demonstrate ~ 80 deg hamstring ROM in supine    Time 6    Period Weeks    Status On-going    Target Date 12/07/21      PT LONG TERM GOAL #4   Title Patient demonstrates improved gait pattern with rollator with decreased antalgic gait pattern and greater safety/independence    Time 6    Period Weeks    Status On-going    Target Date 12/07/21      PT LONG TERM GOAL #5   Title Independent in HEP for LE's as indicated    Time 6    Period Weeks    Status On-going    Target Date 12/07/21                   Plan - 11/09/21 0929     Clinical Impression Statement Improved strength with  bridge.  Attempted log roll from supine to sidelying; able to complete this but not able to propel self to prone position from Rt sidelying.  DASH score improved to 34.09; has met this goal for LUE.  Rt hamstring gradually loosening.  she tolerated exercises with minor increase in pain, resolved with rest. Pt has partially met her goals.    Rehab Potential Good    PT Frequency 2x / week    PT Duration 6 weeks    PT Treatment/Interventions ADLs/Self Care Home Management;Therapeutic exercise;Therapeutic activities;Patient/family education;Manual techniques;Passive range of motion    PT Next Visit Plan continue PROM left shoulder and AAROM exercises; strengthening;  RLE strengthening and  stretches. 20th visit note.    PT Home Exercise Plan [id number]    Consulted and Agree with Plan of Care Patient             Patient will benefit from skilled therapeutic intervention in order to improve the following deficits and impairments:  Decreased knowledge of precautions, Impaired UE functional use, Pain, Postural dysfunction, Decreased range of motion, Abnormal gait, Decreased activity tolerance, Impaired flexibility, Decreased mobility, Difficulty walking  Visit Diagnosis: Chronic left shoulder pain  Stiffness of left shoulder, not elsewhere classified  Pain in right leg  Abnormal posture  Other symptoms and signs involving the musculoskeletal system     Problem List Patient Active Problem List   Diagnosis Date Noted   Malignant neoplasm of upper-outer quadrant of left breast in female, estrogen receptor positive ([place]) 08/27/2021   Abnormal ultrasound of endometrium 04/08/2021   Kerin Perna, PTA 11/09/21 9:41 AM   [place], [place], [address] Phone: [phone]   Fax:  [phone]  Name: Brooke Duncan MRN: [id number] Date of Birth: 1946-01-11

## 2021-11-12 ENCOUNTER — Encounter: Payer: Medicare Other | Admitting: Physical Therapy

## 2021-11-17 ENCOUNTER — Encounter: Payer: Self-pay | Admitting: Rehabilitative and Restorative Service Providers"

## 2021-11-17 ENCOUNTER — Other Ambulatory Visit: Payer: Self-pay

## 2021-11-17 ENCOUNTER — Ambulatory Visit (INDEPENDENT_AMBULATORY_CARE_PROVIDER_SITE_OTHER): Payer: Medicare Other | Admitting: Rehabilitative and Restorative Service Providers"

## 2021-11-17 DIAGNOSIS — R29898 Other symptoms and signs involving the musculoskeletal system: Secondary | ICD-10-CM

## 2021-11-17 DIAGNOSIS — M79604 Pain in right leg: Secondary | ICD-10-CM

## 2021-11-17 DIAGNOSIS — M25562 Pain in left knee: Secondary | ICD-10-CM | POA: Diagnosis not present

## 2021-11-17 DIAGNOSIS — M25512 Pain in left shoulder: Secondary | ICD-10-CM

## 2021-11-17 DIAGNOSIS — R293 Abnormal posture: Secondary | ICD-10-CM

## 2021-11-17 DIAGNOSIS — G8929 Other chronic pain: Secondary | ICD-10-CM

## 2021-11-17 DIAGNOSIS — M25612 Stiffness of left shoulder, not elsewhere classified: Secondary | ICD-10-CM

## 2021-11-17 NOTE — Therapy (Signed)
[facility] [address] [place], [place], [address] Phone: [phone]   Fax:  [phone]  Physical Therapy Treatment and 20th visit Medicare Note   Progress Note Reporting Period 10/01/21 to 11/17/21  See note below for Objective Data and Assessment of Progress/Goals.      Patient Details  Name: Brooke Duncan MRN: [id number] Date of Birth: 1946/06/12 Referring Provider (PT): Dr. Rolm Bookbinder   Encounter Date: 11/17/2021   PT End of Session - 11/17/21 0933     Visit Number 20    Number of Visits 28    Date for PT Re-Evaluation 12/07/21    Authorization - Visit Number 20    Progress Note Due on Visit 20    PT Start Time 0928    PT Stop Time 1016    PT Time Calculation (min) 48 min    Activity Tolerance Patient tolerated treatment well             Past Medical History:  Diagnosis Date   Acquired solitary kidney    right side 2014   Arthritis    Frequency of urination    Heart murmur    per told by pcp approx. 02/ 2022 heard a very faint murmur, told did need work-up done at this time   History of Hashimoto thyroiditis    s/p  total thyroidectomy 1995   History of hypercalcemia    s/p  parathryoidectomy 07/ 2021   History of renal cell carcinoma 2014   s/p  left nephrectomy, pt stated no other treatment   Hypertension    followed by pcp   Hypothyroidism, postsurgical 1995   followed by pcp in [place], moved from [place] 01/ 2021   OSA on CPAP    Thickened endometrium    Wears glasses     Past Surgical History:  Procedure Laterality Date   HYSTEROSCOPY WITH D & C N/A 04/08/2021   Procedure: DILATATION AND CURETTAGE /HYSTEROSCOPY, EXCISION OF VAGINAL LESION;  Surgeon: Delsa Bern, MD;  Location: [facility];  Service: Gynecology;  Laterality: N/A;   LAPAROSCOPIC CHOLECYSTECTOMY  2005   NEPHRECTOMY Left 2014   PARATHYROIDECTOMY  07/ 2021  in [place]  age  75   TOTAL HIP ARTHROPLASTY Bilateral left 2015;  right 2009   TOTAL KNEE ARTHROPLASTY Left 2014   TOTAL THYROIDECTOMY Bilateral 1995    There were no vitals filed for this visit.   Subjective Assessment - 11/17/21 0934     Subjective Pt reports no new changes since last visit. She states she doesn't have a cane at home, only the rollator.  She has a repeat mammo and [place] 12/08/21 and has follow up with surgeon on 12/10/21 and oncologist 12/13/21.    Currently in Pain? Yes    Pain Score 3     Pain Location Generalized    Pain Orientation Left    Pain Descriptors / Indicators Aching;Dull                [facility] PT Assessment - 11/17/21 0001       Assessment   Medical Diagnosis Left breast cancer; left shoulder pain, Rt knee/LE pain    Referring Provider (PT) Dr. Rolm Bookbinder    Onset Date/Surgical Date 06/09/21   08/30/21 injury to Rt leg   Hand Dominance Right    Prior Therapy none      AROM   Right Shoulder Extension 42 Degrees    Right Shoulder  Flexion 144 Degrees    Right Shoulder ABduction 148 Degrees    Right Shoulder External Rotation 75 Degrees    Left Shoulder Flexion 120 Degrees   patterns of substitution Lt shoulder girdle   Left Shoulder ABduction 118 Degrees   patterns of substitution shoulder girdle   Left Shoulder External Rotation 71 Degrees    Right Knee Extension -3    Right Knee Flexion 102    Left Knee Extension 0    Left Knee Flexion 102      PROM   Left Shoulder Flexion 140 Degrees    Left Shoulder ABduction 140 Degrees   in scapular plane   Left Shoulder External Rotation 65 Degrees   in scapular plane     Strength   Overall Strength Comments functional strength bilat LE's not assessed resistively      Flexibility   Hamstrings RLE:  75    ITB tight Rt > Lt    Piriformis tight bilat      Palpation   Palpation comment tight Rt posterior thigh to calf; Lt shoulder girdle - pecs; upper trap; teres; biceps; triceps; deltoid      Ambulation/Gait    Ambulation Distance (Feet) 40 Feet    Assistive device Straight cane    Gait Pattern Step-through pattern;Decreased stance time - right;Decreased hip/knee flexion - right;Antalgic    Ambulation Surface Level;Indoor    Gait Comments cues for even weight shift and placement of cane closer to body.                           [facility] Adult PT Treatment/Exercise - 11/17/21 0001       Knee/Hip Exercises: Stretches   Passive Hamstring Stretch Right;3 reps;Left;1 rep;30 seconds   supine with strap   Passive Hamstring Stretch Limitations supine with strap    ITB Stretch Right;2 reps;30 seconds    ITB Stretch Limitations supine with strap      Knee/Hip Exercises: Standing   Heel Raises Both;1 set;10 reps    Hip Extension AROM;Stengthening;Right;Knee straight;Left;10 reps    Extension Limitations UE support on counter as needed    Functional Squat 1 set;10 reps   mini, with UE on counter   SLS 5-10 sec x 3 reps each side    Gait Training side stepping at counter x 8 ft Rt/Lt x 6 reps with placing cones on overhead shelf with LUE. high knee marching then retro gait with hand on counter x 8 ft x 6      Knee/Hip Exercises: Supine   Quad Sets Strengthening;Right;10 reps    Quad Sets Limitations 5 sec hold    Bridges Strengthening;1 set;10 reps   3 sec hold in extension     Shoulder Exercises: Standing   Extension Strengthening;Both;10 reps    Theraband Level (Shoulder Extension) Level 2 (Red)    Row Both;15 reps    Theraband Level (Shoulder Row) Level 3 (Green)      Shoulder Exercises: Pulleys   Flexion Limitations 10-20 sec holds x 10    Scaption Limitations 10-20 sec holds x 5 reps      Shoulder Exercises: ROM/Strengthening   Nustep L5 x 5 min U/LE's    Wall Pushups 10 reps      Moist Heat Therapy   Number Minutes Moist Heat 10 Minutes    Moist Heat Location Knee   posterior thigh/knee  PT Short Term Goals - 11/09/21 0940        PT SHORT TERM GOAL #1   Title Patient will be independent in her home exercise program for shoulder ROM.    Time 6    Period Weeks    Status On-going    Target Date 10/22/21      PT SHORT TERM GOAL #2   Title Patient will increase left shoulder flexion to >/= 120 degrees for more functional reach.    Baseline -    Time 6    Period Weeks    Status Achieved    Target Date 10/22/21      PT SHORT TERM GOAL #3   Title Patient will increase left shoulder abduction to >/= 120 degrees to more easily obtain surgical position and reach with greater ease.    Baseline 100 :11/09/21    Time 6    Period Weeks    Status On-going    Target Date 10/22/21      PT SHORT TERM GOAL #4   Title Patient will improve her DASH score to be </= 40 for improved overall UE function.    Baseline -    Time 6    Period Weeks    Status Achieved    Target Date 10/22/21      PT SHORT TERM GOAL #5   Title Patient will report >/= 25% decrease in left shoulder pain with daily tasks.    Time 6    Period Weeks    Status Achieved    Target Date 10/22/21               PT Long Term Goals - 11/17/21 0951       PT LONG TERM GOAL #1   Title Patient will demonstrate she has regained left shoulder function post operatively when compared to baselines.    Time 6    Period Weeks    Status Partially Met    Target Date 12/07/21      PT LONG TERM GOAL #2   Title Decrease pain Rt LE by 75% allowing patient to stand, walk, move in/out of transitional movements with improved safety and decreased pain    Baseline 65% improvement    Time 6    Period Weeks    Status Partially Met    Target Date 12/07/21      PT LONG TERM GOAL #3   Title Improve tissue extensibility Rt hamstrings and calf with patient to demonstrate ~ 80 deg hamstring ROM in supine    Time 6    Period Weeks    Status Partially Met      PT LONG TERM GOAL #4   Title Patient demonstrates improved gait pattern with rollator with decreased  antalgic gait pattern and greater safety/independence    Time 6    Period Weeks    Status Partially Met    Target Date 12/07/21      PT LONG TERM GOAL #5   Title Independent in HEP for U/LE's as indicated    Time 6    Period Weeks    Status On-going    Target Date 12/07/21                   Plan - 11/17/21 0953     Clinical Impression Statement Increasing Lt shoulder and Rt knee ROM; improving functional use of U/LE's; decreased Rt knee pain; improving functional mobility/transfers/gait. Progressing gradually toward goals of therapy. Goals oare  partially accomplished. Patient will continue to work on therapeutic exercise. Will continue with PT pending return to MD and additional diagnostic testing.    Rehab Potential Good    PT Frequency 2x / week    PT Duration 6 weeks    PT Treatment/Interventions ADLs/Self Care Home Management;Therapeutic exercise;Therapeutic activities;Patient/family education;Manual techniques;Passive range of motion    PT Next Visit Plan continue PROM left shoulder and AAROM exercises; strengthening;  RLE strengthening and stretches.    PT Home Exercise Plan [email or web address]    Consulted and Agree with Plan of Care Patient             Patient will benefit from skilled therapeutic intervention in order to improve the following deficits and impairments:     Visit Diagnosis: Chronic left shoulder pain  Stiffness of left shoulder, not elsewhere classified  Pain in right leg  Abnormal posture  Other symptoms and signs involving the musculoskeletal system  Acute pain of left knee     Problem List Patient Active Problem List   Diagnosis Date Noted   Malignant neoplasm of upper-outer quadrant of left breast in female, estrogen receptor positive ([place]) 08/27/2021   Abnormal ultrasound of endometrium 04/08/2021    Arlander Gillen Nilda Simmer, PT, MPH 11/17/2021, 10:10 AM  [facility] [place], [place], [address] Phone: [phone]   Fax:  [phone]  Name: Brooke Duncan MRN: [id number] Date of Birth: 09-19-1946

## 2021-11-18 ENCOUNTER — Encounter: Payer: Medicare Other | Admitting: Rehabilitative and Restorative Service Providers"

## 2021-11-19 ENCOUNTER — Encounter: Payer: Medicare Other | Admitting: Physical Therapy

## 2021-11-24 ENCOUNTER — Other Ambulatory Visit: Payer: Self-pay

## 2021-11-24 ENCOUNTER — Ambulatory Visit (INDEPENDENT_AMBULATORY_CARE_PROVIDER_SITE_OTHER): Payer: Medicare Other | Admitting: Rehabilitative and Restorative Service Providers"

## 2021-11-24 DIAGNOSIS — M25562 Pain in left knee: Secondary | ICD-10-CM | POA: Diagnosis not present

## 2021-11-24 DIAGNOSIS — M79604 Pain in right leg: Secondary | ICD-10-CM

## 2021-11-24 DIAGNOSIS — M25612 Stiffness of left shoulder, not elsewhere classified: Secondary | ICD-10-CM

## 2021-11-24 DIAGNOSIS — R29898 Other symptoms and signs involving the musculoskeletal system: Secondary | ICD-10-CM | POA: Diagnosis not present

## 2021-11-24 DIAGNOSIS — R293 Abnormal posture: Secondary | ICD-10-CM | POA: Diagnosis not present

## 2021-11-24 DIAGNOSIS — M25512 Pain in left shoulder: Secondary | ICD-10-CM

## 2021-11-24 DIAGNOSIS — G8929 Other chronic pain: Secondary | ICD-10-CM

## 2021-11-24 NOTE — Patient Instructions (Signed)
Access Code: [email or web address] URL: [email or web address] Date: 11/24/2021 Prepared by: Gillermo Murdoch  Exercises Shoulder External Rotation and Scapular Retraction with Resistance - 2 x daily - 7 x weekly - 3 sets - 10 reps Scapular Retraction with Resistance - 2 x daily - 7 x weekly - 3 sets - 10 reps Scapular Retraction with Resistance Advanced - 2 x daily - 7 x weekly - 3 sets - 10 reps Wall Push Up - 2 x daily - 7 x weekly - 1 sets - 10 reps - 3 sec hold Hooklying Hamstring Stretch with Strap - 2 x daily - 7 x weekly - 1 sets - 3 reps - 30 sec hold Seated Hamstring Stretch - 2 x daily - 7 x weekly - 1 sets - 3 reps - 30 sec hold Supine ITB Stretch with Strap - 2 x daily - 7 x weekly - 1 sets - 3 reps - 30 sec hold Hip Adductors and Hamstring Stretch with Strap - 2 x daily - 7 x weekly - 1 sets - 3 reps - 30 sec hold Supine Quad Set - 2 x daily - 7 x weekly - 1 sets - 10 reps - 3-5 sec hold Active Straight Leg Raise with Quad Set - 2 x daily - 7 x weekly - 1 sets - 10 reps - 3 sec hold Side Stepping with Counter Support Backward Walking with Counter Support Mini Squat - 1 x daily - 7 x weekly - 1-2 sets - 10 reps - 2-3 sec hold Standing Heel Raise Side Stepping with Resistance at Thighs - 1 x daily - 7 x weekly Standing Hip Abduction with Resistance at Thighs - 1 x daily - 7 x weekly - 2 sets - 10 reps - 2-3 sec hold Standing Hip Extension with Resistance at Ankles and Counter Support - 1 x daily - 7 x weekly - 2 sets - 10 reps - 2-3 sec hold

## 2021-11-24 NOTE — Therapy (Signed)
[facility] [address] [place], [place], [address] Phone: [phone]   Fax:  [phone]  Physical Therapy Treatment  Patient Details  Name: Brooke Duncan MRN: [id number] Date of Birth: 14-Jul-1946 Referring Provider (PT): Dr. Emelia Loron   Encounter Date: 11/24/2021   PT End of Session - 11/24/21 0940     Visit Number 21    Number of Visits 28    Date for PT Re-Evaluation 12/07/21    Authorization - Visit Number 30    Progress Note Due on Visit 30    PT Start Time [id number]   pt late for appt   PT Stop Time 1015    PT Time Calculation (min) 38 min    Activity Tolerance Patient tolerated treatment well             Past Medical History:  Diagnosis Date   Acquired solitary kidney    right side 2014   Arthritis    Frequency of urination    Heart murmur    per told by pcp approx. 02/ 2022 heard a very faint murmur, told did need work-up done at this time   History of Hashimoto thyroiditis    s/p  total thyroidectomy 1995   History of hypercalcemia    s/p  parathryoidectomy 07/ 2021   History of renal cell carcinoma 2014   s/p  left nephrectomy, pt stated no other treatment   Hypertension    followed by pcp   Hypothyroidism, postsurgical 1995   followed by pcp in [place], moved from [place] 01/ 2021   OSA on CPAP    Thickened endometrium    Wears glasses     Past Surgical History:  Procedure Laterality Date   HYSTEROSCOPY WITH D & C N/A 04/08/2021   Procedure: DILATATION AND CURETTAGE /HYSTEROSCOPY, EXCISION OF VAGINAL LESION;  Surgeon: Silverio Lay, MD;  Location: [facility];  Service: Gynecology;  Laterality: N/A;   LAPAROSCOPIC CHOLECYSTECTOMY  2005   NEPHRECTOMY Left 2014   PARATHYROIDECTOMY  07/ 2021  in [place]   TONSILLECTOMY  age 75   TOTAL HIP ARTHROPLASTY Bilateral left 2015;  right 2009   TOTAL KNEE ARTHROPLASTY Left 2014   TOTAL THYROIDECTOMY Bilateral 1995     There were no vitals filed for this visit.   Subjective Assessment - 11/24/21 0941     Subjective Pt reports that she is doing OK - no new changes since last visit. She states she doesn't have a cane at home, only the rollator.  She has a repeat mammo and [place] 12/08/21 and has follow up with surgeon on 12/10/21 and oncologist 12/13/21. Always feels pain. Pain is mostly noted in the Rt knee and hamstring.    Currently in Pain? Yes    Pain Score 2     Pain Location Generalized    Pain Descriptors / Indicators Aching;Dull    Pain Type Acute pain;Chronic pain                [facility] PT Assessment - 11/24/21 0001       Assessment   Medical Diagnosis Left breast cancer; left shoulder pain, Rt knee/LE pain    Referring Provider (PT) Dr. Emelia Loron    Onset Date/Surgical Date 06/09/21   08/30/21 injury to Rt leg   Hand Dominance Right    Prior Therapy none      Ambulation/Gait   Ambulation Distance (Feet) 40 Feet    Assistive device Rollator  Gait Pattern Step-through pattern;Decreased stance time - right;Decreased hip/knee flexion - right;Antalgic    Ambulation Surface Level;Indoor      Functional Gait  Assessment   Gait assessed  --   continued limp Rt LE in Wt bering on Rt                          [facility] Adult PT Treatment/Exercise - 11/24/21 0001       Ambulation/Gait   Gait Comments cues for even weight shift      Knee/Hip Exercises: Stretches   Active Hamstring Stretch Right;2 reps;Left;1 rep;30 seconds    Active Hamstring Stretch Limitations seated with hip hinge      Knee/Hip Exercises: Standing   Heel Raises Both;2 sets;10 reps    Heel Raises Limitations green TB proximal to knees slight tensioin on band with heel raises    Hip Abduction Stengthening;Right;Left;2 sets;10 reps;Knee straight    Abduction Limitations green TB proximal to knees    Hip Extension AROM;Stengthening;Right;Left;20 reps;Knee straight    Extension Limitations UE support  on counter as needed; green TB proximal to knees    Functional Squat 2 sets;10 reps   mini, with UE on counter   SLS increases knee pain - held today    Gait Training side stepping at counter x 8 ft Rt/Lt x 6 reps each direction green TB proximal to knees    Other Standing Knee Exercises standing reaching overhead x 10; shoulder horizontal ab/adduction x 10 ; with forward punches x 10 (at shoulder height); shoulder abduction and adduction to shoulder height x 10; arm circles fwd x10/bakward x 10  working on posture and even wt  between LEs    Other Standing Knee Exercises marching ~ 2 min; hip extension x 15 each LE; repeated mini squats x 10 with UE support on rolator ; standing reaching forward/side/down/in various directions without UE support to work on balance, wt shift, UE reach for functional activities      Knee/Hip Exercises: Seated   Sit to Sand 10 reps;without UE support   ~ 20 inch height 1 sets of 5                    PT Education - 11/24/21 1004     Education Details HEP    Person(s) Educated Patient    Methods Explanation;Demonstration;Tactile cues;Verbal cues;Handout    Comprehension Verbalized understanding;Returned demonstration;Verbal cues required;Tactile cues required              PT Short Term Goals - 11/09/21 0940       PT SHORT TERM GOAL #1   Title Patient will be independent in her home exercise program for shoulder ROM.    Time 6    Period Weeks    Status On-going    Target Date 10/22/21      PT SHORT TERM GOAL #2   Title Patient will increase left shoulder flexion to >/= 120 degrees for more functional reach.    Baseline -    Time 6    Period Weeks    Status Achieved    Target Date 10/22/21      PT SHORT TERM GOAL #3   Title Patient will increase left shoulder abduction to >/= 120 degrees to more easily obtain surgical position and reach with greater ease.    Baseline 100 :11/09/21    Time 6    Period Weeks    Status On-going  Target Date 10/22/21      PT SHORT TERM GOAL #4   Title Patient will improve her DASH score to be </= 40 for improved overall UE function.    Baseline -    Time 6    Period Weeks    Status Achieved    Target Date 10/22/21      PT SHORT TERM GOAL #5   Title Patient will report >/= 25% decrease in left shoulder pain with daily tasks.    Time 6    Period Weeks    Status Achieved    Target Date 10/22/21               PT Long Term Goals - 11/17/21 0951       PT LONG TERM GOAL #1   Title Patient will demonstrate she has regained left shoulder function post operatively when compared to baselines.    Time 6    Period Weeks    Status Partially Met    Target Date 12/07/21      PT LONG TERM GOAL #2   Title Decrease pain Rt LE by 75% allowing patient to stand, walk, move in/out of transitional movements with improved safety and decreased pain    Baseline 65% improvement    Time 6    Period Weeks    Status Partially Met    Target Date 12/07/21      PT LONG TERM GOAL #3   Title Improve tissue extensibility Rt hamstrings and calf with patient to demonstrate ~ 80 deg hamstring ROM in supine    Time 6    Period Weeks    Status Partially Met      PT LONG TERM GOAL #4   Title Patient demonstrates improved gait pattern with rollator with decreased antalgic gait pattern and greater safety/independence    Time 6    Period Weeks    Status Partially Met    Target Date 12/07/21      PT LONG TERM GOAL #5   Title Independent in HEP for U/LE's as indicated    Time 6    Period Weeks    Status On-going    Target Date 12/07/21                   Plan - 11/24/21 0943     Clinical Impression Statement Continued general pain which is worse in the Rt knee. She continues to work on HEP some but has to care for her husband. Patient continues to demonstrate limp with gait; limited Rt knee ROM, decreased strength Rt LE and general deconditioning. She is using Lt UE for functional  activities.    Rehab Potential Good    PT Frequency 2x / week    PT Duration 6 weeks    PT Treatment/Interventions ADLs/Self Care Home Management;Therapeutic exercise;Therapeutic activities;Patient/family education;Manual techniques;Passive range of motion    PT Next Visit Plan continue PROM left shoulder and AAROM exercises; strengthening;  RLE strengthening and stretches.    PT Home Exercise Plan [email or web address]    Consulted and Agree with Plan of Care Patient             Patient will benefit from skilled therapeutic intervention in order to improve the following deficits and impairments:     Visit Diagnosis: Chronic left shoulder pain  Stiffness of left shoulder, not elsewhere classified  Pain in right leg  Abnormal posture  Other symptoms and signs involving the musculoskeletal system  Acute pain of  left knee     Problem List Patient Active Problem List   Diagnosis Date Noted   Malignant neoplasm of upper-outer quadrant of left breast in female, estrogen receptor positive ([place]) 08/27/2021   Abnormal ultrasound of endometrium 04/08/2021    Lilas Diefendorf Nilda Simmer, PT, MPH  11/24/2021, 10:16 AM  [facility] [place], [place], [address] Phone: [phone]   Fax:  [phone]  Name: Brooke Duncan MRN: [id number] Date of Birth: July 08, 1946

## 2021-11-26 ENCOUNTER — Encounter: Payer: Medicare Other | Admitting: Physical Therapy

## 2021-11-30 ENCOUNTER — Encounter: Payer: Medicare Other | Admitting: Physical Therapy

## 2021-12-02 ENCOUNTER — Encounter: Payer: Medicare Other | Admitting: Physical Therapy

## 2021-12-08 ENCOUNTER — Other Ambulatory Visit: Payer: Self-pay

## 2021-12-08 ENCOUNTER — Ambulatory Visit
Admission: RE | Admit: 2021-12-08 | Discharge: 2021-12-08 | Disposition: A | Discharge status: Home / Self Care | Payer: Medicare Other | Source: Ambulatory Visit | Attending: Hematology and Oncology | Admitting: Hematology and Oncology

## 2021-12-08 ENCOUNTER — Other Ambulatory Visit: Payer: Self-pay | Admitting: Hematology and Oncology

## 2021-12-08 DIAGNOSIS — C50412 Malignant neoplasm of upper-outer quadrant of left female breast: Secondary | ICD-10-CM

## 2021-12-08 DIAGNOSIS — Z170 Estrogen receptor positive status [ER+]: Secondary | ICD-10-CM

## 2021-12-08 DIAGNOSIS — N6002 Solitary cyst of left breast: Secondary | ICD-10-CM

## 2021-12-09 ENCOUNTER — Encounter: Payer: Self-pay | Admitting: *Deleted

## 2021-12-10 ENCOUNTER — Other Ambulatory Visit: Payer: Self-pay | Admitting: *Deleted

## 2021-12-10 ENCOUNTER — Telehealth: Payer: Self-pay | Admitting: *Deleted

## 2021-12-10 DIAGNOSIS — C50412 Malignant neoplasm of upper-outer quadrant of left female breast: Secondary | ICD-10-CM

## 2021-12-10 DIAGNOSIS — Z170 Estrogen receptor positive status [ER+]: Secondary | ICD-10-CM

## 2021-12-10 NOTE — Telephone Encounter (Signed)
Received message from Dr. Cristal Generous office that he saw patient today and she has decided to stay on letrozole another 3 months and get re-imaged and that Dr. Geralyn Flash appt can be cancelled. The patient was told we would cancel appt then r/s for 3 months after imaging. Appt cx.  Orders placed for mammo/us in 3 months.

## 2021-12-13 ENCOUNTER — Ambulatory Visit: Payer: Medicare Other | Admitting: Hematology and Oncology

## 2021-12-14 ENCOUNTER — Encounter: Payer: Self-pay | Admitting: *Deleted

## 2021-12-14 ENCOUNTER — Other Ambulatory Visit: Payer: Medicare Other

## 2021-12-27 ENCOUNTER — Ambulatory Visit
Admission: RE | Admit: 2021-12-27 | Discharge: 2021-12-27 | Disposition: A | Discharge status: Home / Self Care | Payer: Medicare Other | Source: Ambulatory Visit | Attending: Hematology and Oncology | Admitting: Hematology and Oncology

## 2021-12-27 DIAGNOSIS — C50412 Malignant neoplasm of upper-outer quadrant of left female breast: Secondary | ICD-10-CM

## 2021-12-27 DIAGNOSIS — Z170 Estrogen receptor positive status [ER+]: Secondary | ICD-10-CM

## 2021-12-27 DIAGNOSIS — N6002 Solitary cyst of left breast: Secondary | ICD-10-CM

## 2021-12-28 ENCOUNTER — Encounter: Payer: Self-pay | Admitting: *Deleted

## 2021-12-28 ENCOUNTER — Other Ambulatory Visit: Payer: Self-pay | Admitting: Hematology and Oncology

## 2021-12-28 DIAGNOSIS — Z09 Encounter for follow-up examination after completed treatment for conditions other than malignant neoplasm: Secondary | ICD-10-CM

## 2021-12-28 DIAGNOSIS — N6002 Solitary cyst of left breast: Secondary | ICD-10-CM

## 2022-01-07 ENCOUNTER — Encounter: Payer: Self-pay | Admitting: Physical Therapy

## 2022-01-07 ENCOUNTER — Ambulatory Visit: Payer: Medicare Other | Attending: Family Medicine | Admitting: Physical Therapy

## 2022-01-07 ENCOUNTER — Other Ambulatory Visit: Payer: Self-pay

## 2022-01-07 DIAGNOSIS — M25552 Pain in left hip: Secondary | ICD-10-CM | POA: Diagnosis not present

## 2022-01-07 DIAGNOSIS — M6281 Muscle weakness (generalized): Secondary | ICD-10-CM | POA: Insufficient documentation

## 2022-01-07 DIAGNOSIS — R29898 Other symptoms and signs involving the musculoskeletal system: Secondary | ICD-10-CM | POA: Insufficient documentation

## 2022-01-07 DIAGNOSIS — R5381 Other malaise: Secondary | ICD-10-CM | POA: Diagnosis not present

## 2022-01-07 NOTE — Patient Instructions (Signed)
Access Code: [email or web address] URL: [email or web address] Date: 01/07/2022 Prepared by: Isabelle Course  Exercises Seated Transversus Abdominis Bracing - 1 x daily - 7 x weekly - 1 sets - 10 reps - 5 seconds hold Seated Hamstring Stretch - 1 x daily - 7 x weekly - 1 sets - 3 reps - 20-30 seconds hold Standing 'L' Stretch at Counter - 1 x daily - 7 x weekly - 1 sets - 3 reps - 20-30 seconds hold Supine Figure 4 Piriformis Stretch - 1 x daily - 7 x weekly - 1 sets - 3 reps - 20-30 seconds hold Hooklying Single Knee to Chest - 1 x daily - 7 x weekly - 1 sets - 3 reps - 20-30 seconds hold

## 2022-01-07 NOTE — Therapy (Signed)
[place], [place], [address] Phone: [phone]   Fax:  [phone]  Physical Therapy Evaluation  Patient Details  Name: Brooke Duncan MRN: [id number] Date of Birth: [birth year] Referring Provider (PT): Radene Ou, [place]   Encounter Date: 01/07/2022   PT End of Session - 01/07/22 1148     Visit Number 1    Number of Visits 12    Date for PT Re-Evaluation 02/18/22    Authorization Type medicare    Authorization - Visit Number 1    Progress Note Due on Visit 10    PT Start Time 1100    PT Stop Time 1145    PT Time Calculation (min) 45 min    Activity Tolerance Patient tolerated treatment well    Behavior During Therapy [facility] for tasks assessed/performed             Past Medical History:  Diagnosis Date   Acquired solitary kidney    right side 2014   Arthritis    Frequency of urination    Heart murmur    per told by pcp approx. 02/ 2022 heard a very faint murmur, told did need work-up done at this time   History of Hashimoto thyroiditis    s/p  total thyroidectomy 1995   History of hypercalcemia    s/p  parathryoidectomy 07/ 2021   History of renal cell carcinoma 2014   s/p  left nephrectomy, pt stated no other treatment   Hypertension    followed by pcp   Hypothyroidism, postsurgical 1995   followed by pcp in [place], moved from [place] 01/ 2021   OSA on CPAP    Thickened endometrium    Wears glasses     Past Surgical History:  Procedure Laterality Date   BREAST BIOPSY Left 08/24/2021   HYSTEROSCOPY WITH D & C N/A 04/08/2021   Procedure: DILATATION AND CURETTAGE /HYSTEROSCOPY, EXCISION OF VAGINAL LESION;  Surgeon: Delsa Bern, MD;  Location: [facility];  Service: Gynecology;  Laterality: N/A;   LAPAROSCOPIC CHOLECYSTECTOMY  2005   NEPHRECTOMY Left 2014   PARATHYROIDECTOMY  07/ 2021  in [place]  age 76   TOTAL HIP ARTHROPLASTY Bilateral  left 2015;  right 2009   TOTAL KNEE ARTHROPLASTY Left 2014   TOTAL THYROIDECTOMY Bilateral 1995    There were no vitals filed for this visit.    Subjective Assessment - 01/07/22 1101     Subjective Pt has recently been in therapy for her Rt knee and Lt shoulder. She now presents due to pain in Lt hip/buttocks that goes down to her thigh that has been going on for "1 month". Pt states most pain is when she is trying to sleep at night as she has to sleep supine due to hip replacements. Pain eases with use of tylenol and seated rest    Pertinent History Pt has bilat hip replacements, she needs a Rt knee replacement but is waiting. She has Lt breast cancer and is undergoing treatments.    Limitations Other (comment)   sleep   Patient Stated Goals Be able to get in/out of bed and sleep without hip pain    Currently in Pain? Yes    Pain Score 2     Pain Location Buttocks    Pain Orientation Left    Pain Descriptors / Indicators Sore    Pain Type Chronic pain  [facility] PT Assessment - 01/07/22 0001       Assessment   Medical Diagnosis physical deconditioning, Lt sciatic nerve pain    Referring Provider (PT) Rankins, [place]    Onset Date/Surgical Date 12/05/21    Hand Dominance Right    Prior Therapy for shoulder and knee      Precautions   Precautions None    Precaution Comments breast cancer on Lt, undergoing treatment      Restrictions   Weight Bearing Restrictions No      Balance Screen   Has the patient fallen in the past 6 months Yes    How many times? 1    Has the patient had a decrease in activity level because of a fear of falling?  No    Is the patient reluctant to leave their home because of a fear of falling?  No      Prior Function   Level of Independence Independent with community mobility with device    Vocation Requirements retired      Observation/Other Assessments   Focus on Therapeutic Outcomes (FOTO)  not performed      Strength    Overall Strength Comments performed in sitting    Strength Assessment Site Knee;Hip    Right/Left Hip Right;Left    Right Hip Flexion 4-/5    Right Hip ABduction 4-/5    Left Hip Flexion 3/5   pain   Left Hip ABduction 3+/5    Right/Left Knee Left    Left Knee Flexion 4/5    Left Knee Extension 4+/5      Flexibility   Hamstrings decreased bilat    ITB decreased bilat    Piriformis decreased bilat      Palpation   Palpation comment increased mm spasticity Lt >Rt piriformis and glutes      Transfers   Five time sit to stand comments  28.7 seconds      Ambulation/Gait   Ambulation Distance (Feet) 110 Feet    Assistive device Rollator    Gait Pattern Antalgic   decreased Rt LE stance time due to knee pain                       Objective measurements completed on examination: See above findings.       [facility] Adult PT Treatment/Exercise - 01/07/22 0001       Knee/Hip Exercises: Stretches   Passive Hamstring Stretch Left;Right;20 seconds    Passive Hamstring Stretch Limitations seated with hip hinge    Other Knee/Hip Stretches single knee to chest Lt LE x 20 sec, standing L stretch at counter with forward and each side x 20 sec    Other Knee/Hip Stretches figure 4 stretch Lt hip x 20 sec      Knee/Hip Exercises: Seated   Other Seated Knee/Hip Exercises ab set 5 sec hold x 3      Manual Therapy   Manual therapy comments pt educated in self massage for glutes and piriformis with tennis ball                     PT Education - 01/07/22 1134     Education Details PT POC and goals, HEP, tennis ball self massage    Person(s) Educated Patient    Methods Explanation;Demonstration;Handout    Comprehension Verbalized understanding;Returned demonstration              PT Short Term Goals - 11/09/21 0940  PT SHORT TERM GOAL #1   Title Patient will be independent in her home exercise program for shoulder ROM.    Time 6    Period Weeks     Status On-going    Target Date 10/22/21      PT SHORT TERM GOAL #2   Title Patient will increase left shoulder flexion to >/= 120 degrees for more functional reach.    Baseline -    Time 6    Period Weeks    Status Achieved    Target Date 10/22/21      PT SHORT TERM GOAL #3   Title Patient will increase left shoulder abduction to >/= 120 degrees to more easily obtain surgical position and reach with greater ease.    Baseline 100 :11/09/21    Time 6    Period Weeks    Status On-going    Target Date 10/22/21      PT SHORT TERM GOAL #4   Title Patient will improve her DASH score to be </= 40 for improved overall UE function.    Baseline -    Time 6    Period Weeks    Status Achieved    Target Date 10/22/21      PT SHORT TERM GOAL #5   Title Patient will report >/= 25% decrease in left shoulder pain with daily tasks.    Time 6    Period Weeks    Status Achieved    Target Date 10/22/21               PT Long Term Goals - 01/07/22 1152       PT LONG TERM GOAL #1   Title Pt will be independent with HEP    Time 6    Period Weeks    Status New    Target Date 02/18/22      PT LONG TERM GOAL #2   Title Pt will report being able to get in/out of bed with pain <= 1/10    Time 6    Period Weeks    Status New    Target Date 02/18/22      PT LONG TERM GOAL #3   Title Pt will improve LE strength to 4/5 to improve activity tolerance    Time 6    Period Weeks    Status New    Target Date 02/18/22                    Plan - 01/07/22 1149     Clinical Impression Statement Pt is a 76 y/o female referred for Lt sciatic nerve pain and general deconditioning. Pt presents with increased mm spasticity in Lt glutes and piriformis, decreased LE strength and flexibility, decreased activity tolerance and increased pain. Pt will benefit from skilled PT to address deficits and improve functional mobiltiy    Personal Factors and Comorbidities Comorbidity 3+;Time since  onset of injury/illness/exacerbation;Fitness    Examination-Activity Limitations Sleep;Bed Mobility    Examination-Participation Restrictions Community Activity    Stability/Clinical Decision Making Evolving/Moderate complexity    Clinical Decision Making Moderate    Rehab Potential Good    PT Frequency 2x / week    PT Duration 6 weeks    PT Treatment/Interventions Passive range of motion;Taping;Dry needling;Manual techniques;Patient/family education;Therapeutic exercise;Neuromuscular re-education;Balance training;Therapeutic activities;Moist Heat;Cryotherapy;Iontophoresis 4mg /ml Dexamethasone;Electrical Stimulation    PT Next Visit Plan assess HEP, progress hip/core strength and flexibility    PT Home Exercise Plan [email or web address]  Consulted and Agree with Plan of Care Patient             Patient will benefit from skilled therapeutic intervention in order to improve the following deficits and impairments:  Pain, Impaired flexibility, Decreased strength, Decreased activity tolerance, Decreased balance, Difficulty walking, Increased muscle spasms, Decreased mobility  Visit Diagnosis: Muscle weakness (generalized) - Plan: PT plan of care cert/re-cert  Pain in left hip - Plan: PT plan of care cert/re-cert  Other symptoms and signs involving the musculoskeletal system - Plan: PT plan of care cert/re-cert     Problem List Patient Active Problem List   Diagnosis Date Noted   Malignant neoplasm of upper-outer quadrant of left breast in female, estrogen receptor positive ([place]) 08/27/2021   Abnormal ultrasound of endometrium 04/08/2021    Kaven Cumbie, PT 01/07/2022, 11:55 AM  [facility] [place], [place], [address] Phone: [phone]   Fax:  [phone]  Name: Brooke Duncan MRN: [id number] Date of Birth: 10/01/1946

## 2022-01-12 ENCOUNTER — Encounter: Payer: Self-pay | Admitting: Physical Therapy

## 2022-01-12 ENCOUNTER — Other Ambulatory Visit: Payer: Self-pay

## 2022-01-12 ENCOUNTER — Ambulatory Visit: Payer: Medicare Other | Admitting: Physical Therapy

## 2022-01-12 ENCOUNTER — Telehealth: Payer: Self-pay | Admitting: Hematology and Oncology

## 2022-01-12 DIAGNOSIS — R29898 Other symptoms and signs involving the musculoskeletal system: Secondary | ICD-10-CM

## 2022-01-12 DIAGNOSIS — R5381 Other malaise: Secondary | ICD-10-CM | POA: Diagnosis not present

## 2022-01-12 DIAGNOSIS — M25552 Pain in left hip: Secondary | ICD-10-CM

## 2022-01-12 DIAGNOSIS — M6281 Muscle weakness (generalized): Secondary | ICD-10-CM

## 2022-01-12 NOTE — Patient Instructions (Signed)
Access Code: [email or web address] URL: [email or web address] Date: 01/12/2022 Prepared by:  [facility] [place]  Exercises Seated Transversus Abdominis Bracing - 1 x daily - 7 x weekly - 1 sets - 10 reps - 5 seconds hold Seated Hamstring Stretch - 1 x daily - 7 x weekly - 1 sets - 3 reps - 20-30 seconds hold Standing 'L' Stretch at Counter - 1 x daily - 7 x weekly - 1 sets - 3 reps - 20-30 seconds hold Supine Figure 4 Piriformis Stretch - 1 x daily - 7 x weekly - 1 sets - 3 reps - 20-30 seconds hold Hooklying Single Knee to Chest - 1 x daily - 7 x weekly - 1 sets - 3 reps - 20-30 seconds hold Hooklying Clamshell with Resistance - 1 x daily - 7 x weekly - 1-2 sets - 10 reps Supine Hip Internal and External Rotation - 1 x daily - 7 x weekly - 1 sets - 3 reps - 10 sec hold

## 2022-01-12 NOTE — Therapy (Signed)
[place], [place], [address] Phone: [phone]   Fax:  [phone]  Physical Therapy Treatment  Patient Details  Name: Brooke Duncan MRN: [id number] Date of Birth: Apr 21, 1946 Referring Provider (PT): Radene Ou, [place]   Encounter Date: 01/12/2022   PT End of Session - 01/12/22 1104     Visit Number 2    Number of Visits 12    Date for PT Re-Evaluation 02/18/22    Authorization Type medicare    Authorization - Visit Number 2    Progress Note Due on Visit 10    PT Start Time 1101    PT Stop Time 1140    PT Time Calculation (min) 39 min    Activity Tolerance Patient tolerated treatment well    Behavior During Therapy [facility] for tasks assessed/performed             Past Medical History:  Diagnosis Date   Acquired solitary kidney    right side 2014   Arthritis    Frequency of urination    Heart murmur    per told by pcp approx. 02/ 2022 heard a very faint murmur, told did need work-up done at this time   History of Hashimoto thyroiditis    s/p  total thyroidectomy 1995   History of hypercalcemia    s/p  parathryoidectomy 07/ 2021   History of renal cell carcinoma 2014   s/p  left nephrectomy, pt stated no other treatment   Hypertension    followed by pcp   Hypothyroidism, postsurgical 1995   followed by pcp in [place], moved from [place] 01/ 2021   OSA on CPAP    Thickened endometrium    Wears glasses     Past Surgical History:  Procedure Laterality Date   BREAST BIOPSY Left 08/24/2021   HYSTEROSCOPY WITH D & C N/A 04/08/2021   Procedure: DILATATION AND CURETTAGE /HYSTEROSCOPY, EXCISION OF VAGINAL LESION;  Surgeon: Delsa Bern, MD;  Location: [facility];  Service: Gynecology;  Laterality: N/A;   LAPAROSCOPIC CHOLECYSTECTOMY  2005   NEPHRECTOMY Left 2014   PARATHYROIDECTOMY  07/ 2021  in [place]  age 76   TOTAL HIP ARTHROPLASTY Bilateral left  2015;  right 2009   TOTAL KNEE ARTHROPLASTY Left 2014   TOTAL THYROIDECTOMY Bilateral 1995    There were no vitals filed for this visit.   Subjective Assessment - 01/12/22 1105     Subjective Pt reports that her left hip is beginning to feel better.  The tennis ball massage to area has helped.  She is doing a few exercises every day.  She now has a home health aide coming to help care for her husband, which has been a huge relief.    Patient Stated Goals Be able to get in/out of bed and sleep without hip pain    Currently in Pain? Yes    Pain Score 3     Pain Location Buttocks    Pain Orientation Left    Pain Descriptors / Indicators Discomfort    Aggravating Factors  laying flat in bed, bending over leaning left    Pain Relieving Factors self massage, MHP                [facility] PT Assessment - 01/12/22 0001       Assessment   Medical Diagnosis physical deconditioning, Lt sciatic nerve pain    Referring Provider (PT) Rankins, [place]    Onset  Date/Surgical Date 12/05/21    Hand Dominance Right    Prior Therapy for shoulder and knee               [facility] Adult PT Treatment/Exercise - 01/12/22 0001       Lumbar Exercises: Stretches   Passive Hamstring Stretch --    Lower Trunk Rotation 5 reps;10 seconds    Lower Trunk Rotation Limitations knees together and knees wide    Other Lumbar Stretch Exercise seated forward flexion, rolling rollator forward x 10 sec x 5 reps      Lumbar Exercises: Aerobic   Nustep L5: arms/legs x 9 min for warm up and conditioning      Lumbar Exercises: Seated   Other Seated Lumbar Exercises ab set with lap press x 5-10 sec x 5 reps      Lumbar Exercises: Supine   Ab Set 5 reps;5 seconds    Clam 10 reps;3 seconds   each leg; green band on thighs   Bent Knee Raise 10 reps;3 seconds   core engaged   Bent Knee Raise Limitations 2nd set with green band on thighs for resistance    Bridge 5 reps      Knee/Hip Exercises: Stretches    Passive Hamstring Stretch Right;Left;2 reps;20 seconds                     PT Education - 01/12/22 1141     Education Details HEP; issued green band    Person(s) Educated Patient    Methods Explanation;Demonstration;Verbal cues;Handout;Tactile cues    Comprehension Verbalized understanding;Returned demonstration                 PT Long Term Goals - 01/07/22 1152       PT LONG TERM GOAL #1   Title Pt will be independent with HEP    Time 6    Period Weeks    Status New    Target Date 02/18/22      PT LONG TERM GOAL #2   Title Pt will report being able to get in/out of bed with pain <= 1/10    Time 6    Period Weeks    Status New    Target Date 02/18/22      PT LONG TERM GOAL #3   Title Pt will improve LE strength to 4/5 to improve activity tolerance    Time 6    Period Weeks    Status New    Target Date 02/18/22                   Plan - 01/12/22 1130     Clinical Impression Statement Positive response to self care and exercises; pt reporting reduced pain level overall.  Supine ifg 4 stretch continues to be difficult to attain, but is possible with pt pulling on pant leg.  No increase in pain with exercises performed today.  Goals are ongoing.    Personal Factors and Comorbidities Comorbidity 3+;Time since onset of injury/illness/exacerbation;Fitness    Examination-Activity Limitations Sleep;Bed Mobility    Examination-Participation Restrictions Community Activity    Stability/Clinical Decision Making Evolving/Moderate complexity    Rehab Potential Good    PT Frequency 2x / week    PT Duration 6 weeks    PT Treatment/Interventions Passive range of motion;Taping;Dry needling;Manual techniques;Patient/family education;Therapeutic exercise;Neuromuscular re-education;Balance training;Therapeutic activities;Moist Heat;Cryotherapy;Iontophoresis 4mg /ml Dexamethasone;Electrical Stimulation    PT Next Visit Plan assess HEP, progress hip/core strength  and flexibility  PT Home Exercise Plan [email or web address]    Consulted and Agree with Plan of Care Patient             Patient will benefit from skilled therapeutic intervention in order to improve the following deficits and impairments:  Pain, Impaired flexibility, Decreased strength, Decreased activity tolerance, Decreased balance, Difficulty walking, Increased muscle spasms, Decreased mobility  Visit Diagnosis: Muscle weakness (generalized)  Pain in left hip  Other symptoms and signs involving the musculoskeletal system     Problem List Patient Active Problem List   Diagnosis Date Noted   Malignant neoplasm of upper-outer quadrant of left breast in female, estrogen receptor positive ([place]) 08/27/2021   Abnormal ultrasound of endometrium 04/08/2021   Kerin Perna, PTA 01/12/22 11:45 AM  [place] [facility] [place] [address] [place], [place], [address] Phone: [phone]   Fax:  [phone]  Name: Brooke Duncan MRN: [id number] Date of Birth: 12-24-1945

## 2022-01-12 NOTE — Telephone Encounter (Signed)
Sch per 1/24 inbasket, pt aware

## 2022-01-13 ENCOUNTER — Encounter: Payer: Self-pay | Admitting: *Deleted

## 2022-01-19 ENCOUNTER — Ambulatory Visit: Payer: Medicare Other | Admitting: Physical Therapy

## 2022-01-21 ENCOUNTER — Ambulatory Visit: Payer: Medicare Other | Admitting: Physical Therapy

## 2022-01-21 ENCOUNTER — Encounter: Payer: Self-pay | Admitting: Physical Therapy

## 2022-01-21 ENCOUNTER — Other Ambulatory Visit: Payer: Self-pay

## 2022-01-21 DIAGNOSIS — R5381 Other malaise: Secondary | ICD-10-CM | POA: Diagnosis not present

## 2022-01-21 DIAGNOSIS — R29898 Other symptoms and signs involving the musculoskeletal system: Secondary | ICD-10-CM

## 2022-01-21 DIAGNOSIS — M6281 Muscle weakness (generalized): Secondary | ICD-10-CM

## 2022-01-21 DIAGNOSIS — M25552 Pain in left hip: Secondary | ICD-10-CM

## 2022-01-21 NOTE — Therapy (Signed)
[place], [place], [address] Phone: [phone]   Fax:  [phone]  Physical Therapy Treatment  Patient Details  Name: Brooke Duncan MRN: [id number] Date of Birth: 06/10/1946 Referring Provider (PT): Radene Ou, [place]   Encounter Date: 01/21/2022   PT End of Session - 01/21/22 1024     Visit Number 3    Number of Visits 12    Date for PT Re-Evaluation 02/18/22    Authorization Type medicare    Authorization - Visit Number 3    Progress Note Due on Visit 10    PT Start Time 1021    PT Stop Time 1100    PT Time Calculation (min) 39 min    Activity Tolerance Patient tolerated treatment well    Behavior During Therapy [facility] for tasks assessed/performed             Past Medical History:  Diagnosis Date   Acquired solitary kidney    right side 2014   Arthritis    Frequency of urination    Heart murmur    per told by pcp approx. 02/ 2022 heard a very faint murmur, told did need work-up done at this time   History of Hashimoto thyroiditis    s/p  total thyroidectomy 1995   History of hypercalcemia    s/p  parathryoidectomy 07/ 2021   History of renal cell carcinoma 2014   s/p  left nephrectomy, pt stated no other treatment   Hypertension    followed by pcp   Hypothyroidism, postsurgical 1995   followed by pcp in [place], moved from [place] 01/ 2021   OSA on CPAP    Thickened endometrium    Wears glasses     Past Surgical History:  Procedure Laterality Date   BREAST BIOPSY Left 08/24/2021   HYSTEROSCOPY WITH D & C N/A 04/08/2021   Procedure: DILATATION AND CURETTAGE /HYSTEROSCOPY, EXCISION OF VAGINAL LESION;  Surgeon: Delsa Bern, MD;  Location: [facility];  Service: Gynecology;  Laterality: N/A;   LAPAROSCOPIC CHOLECYSTECTOMY  2005   NEPHRECTOMY Left 2014   PARATHYROIDECTOMY  07/ 2021  in [place]  age 76   TOTAL HIP ARTHROPLASTY Bilateral  left 2015;  right 2009   TOTAL KNEE ARTHROPLASTY Left 2014   TOTAL THYROIDECTOMY Bilateral 1995    There were no vitals filed for this visit.   Subjective Assessment - 01/21/22 1024     Subjective Pt reports that it has been tough week.  She states she has done her HEP 2x since last visit, due to a lot going on at home.  She notes that she is having an easier time falling to sleep at night.    Pertinent History Pt has bilat hip replacements, she needs a Rt knee replacement but is waiting. She has Lt breast cancer and is undergoing treatments.    Currently in Pain? Yes    Pain Score 3     Pain Location Buttocks    Pain Orientation Left    Pain Descriptors / Indicators Tightness;Tender;Sore    Aggravating Factors  bending over leaning left.    Pain Relieving Factors MHP, self massage                [facility] PT Assessment - 01/21/22 0001       Assessment   Medical Diagnosis physical deconditioning, Lt sciatic nerve pain    Referring Provider (PT) Rankins, [place]    Onset Date/Surgical  Date 12/05/21    Hand Dominance Right    Prior Therapy for shoulder and knee               [facility] Adult PT Treatment/Exercise - 01/21/22 0001       Lumbar Exercises: Stretches   Lower Trunk Rotation 5 reps;10 seconds    Lower Trunk Rotation Limitations knees together    Piriformis Stretch Right;Left;1 rep;30 seconds    Other Lumbar Stretch Exercise seated forward flexion, rolling rollator forward x 10 sec x 5 reps      Lumbar Exercises: Aerobic   Nustep L5: arms/legs x 9 min for warm up and conditioning      Lumbar Exercises: Seated   Sit to Stand 5 reps   2 sets   Other Seated Lumbar Exercises ab set with lap press x 5-10 sec x 5 reps. backward lean with alternating arm lifts to 90 x 10, 2 sets;  weight forward press in/out from core with 2.2# x 5, 4.4# x 5, 3# x 10.    Other Seated Lumbar Exercises clams - R/L/both with green band on thighs x 10 each, then marching with green band  on thighs x 10, 2 sets      Knee/Hip Exercises: Stretches   Passive Hamstring Stretch Right;Left;2 reps;20 seconds              PT Long Term Goals - 01/21/22 1027       PT LONG TERM GOAL #1   Title Pt will be independent with HEP    Time 6    Period Weeks    Status On-going    Target Date 02/18/22      PT LONG TERM GOAL #2   Title Pt will report being able to get in/out of bed with pain <= 1/10    Time 6    Period Weeks    Status On-going    Target Date 02/18/22      PT LONG TERM GOAL #3   Title Pt will improve LE strength to 4/5 to improve activity tolerance    Time 6    Period Weeks    Status On-going    Target Date 02/18/22                   Plan - 01/21/22 1052     Clinical Impression Statement Pt reported reduction of Lt glute pain with exercises.  She tolerated all exercises well.  Pain in Lt hip continues to hurt up to 5/10 getting in/out of bed, but overall her pain level is decreasing.   Pt shown some seated exercises to try for HEP (seated clams, marching, TA sets).    Personal Factors and Comorbidities Comorbidity 3+;Time since onset of injury/illness/exacerbation;Fitness    Examination-Activity Limitations Sleep;Bed Mobility    Examination-Participation Restrictions Community Activity    Stability/Clinical Decision Making Evolving/Moderate complexity    Rehab Potential Good    PT Frequency 2x / week    PT Duration 6 weeks    PT Treatment/Interventions Passive range of motion;Taping;Dry needling;Manual techniques;Patient/family education;Therapeutic exercise;Neuromuscular re-education;Balance training;Therapeutic activities;Moist Heat;Cryotherapy;Iontophoresis 4mg /ml Dexamethasone;Electrical Stimulation    PT Next Visit Plan assess HEP, progress hip/core strength and flexibility    PT Home Exercise Plan [email or web address]    Consulted and Agree with Plan of Care Patient             Patient will benefit from skilled therapeutic intervention in order  to improve the following deficits and impairments:  Pain, Impaired  flexibility, Decreased strength, Decreased activity tolerance, Decreased balance, Difficulty walking, Increased muscle spasms, Decreased mobility  Visit Diagnosis: Muscle weakness (generalized)  Pain in left hip  Other symptoms and signs involving the musculoskeletal system     Problem List Patient Active Problem List   Diagnosis Date Noted   Malignant neoplasm of upper-outer quadrant of left breast in female, estrogen receptor positive ([place]) 08/27/2021   Abnormal ultrasound of endometrium 04/08/2021   Kerin Perna, PTA 01/21/22 11:02 AM   [place], [place], [address] Phone: [phone]   Fax:  [phone]  Name: Brooke Duncan MRN: [id number] Date of Birth: 08-Oct-1946

## 2022-01-26 ENCOUNTER — Ambulatory Visit: Payer: Medicare Other | Admitting: Physical Therapy

## 2022-01-28 ENCOUNTER — Encounter: Payer: Self-pay | Admitting: Physical Therapy

## 2022-02-02 ENCOUNTER — Ambulatory Visit: Payer: Medicare Other | Admitting: Physical Therapy

## 2022-02-09 ENCOUNTER — Encounter: Payer: Medicare Other | Admitting: Physical Therapy

## 2022-02-09 ENCOUNTER — Ambulatory Visit: Payer: Medicare Other | Admitting: Physical Therapy

## 2022-02-11 ENCOUNTER — Ambulatory Visit: Payer: Medicare Other | Admitting: Physical Therapy

## 2022-02-14 ENCOUNTER — Ambulatory Visit: Payer: Medicare Other | Admitting: Rehabilitative and Restorative Service Providers"

## 2022-02-16 ENCOUNTER — Other Ambulatory Visit: Payer: Self-pay

## 2022-02-16 ENCOUNTER — Ambulatory Visit: Payer: Medicare Other | Attending: Family Medicine | Admitting: Physical Therapy

## 2022-02-16 DIAGNOSIS — R29898 Other symptoms and signs involving the musculoskeletal system: Secondary | ICD-10-CM | POA: Insufficient documentation

## 2022-02-16 DIAGNOSIS — M6281 Muscle weakness (generalized): Secondary | ICD-10-CM | POA: Insufficient documentation

## 2022-02-16 DIAGNOSIS — M25552 Pain in left hip: Secondary | ICD-10-CM | POA: Insufficient documentation

## 2022-02-16 NOTE — Therapy (Signed)
[facility] ?[facility] ?[place] ?[place], [place], [address] ?Phone: [phone]   Fax:  [phone] ? ?Physical Therapy Treatment and Recertification ? ?Patient Details  ?Name: Brooke Duncan ?MRN: [id number] ?Date of Birth: 01-20-1946 ?Referring Provider (PT): Rankins, [place] ? ? ?Encounter Date: 02/16/2022 ? ? PT End of Session - 02/16/22 1055   ? ? Visit Number 4   ? Number of Visits 12   ? Date for PT Re-Evaluation 03/30/22   ? Authorization Type medicare   ? Authorization - Visit Number 4   ? Progress Note Due on Visit 10   ? PT Start Time 1020   ? PT Stop Time 1100   ? PT Time Calculation (min) 40 min   ? Activity Tolerance Patient tolerated treatment well   ? Behavior During Therapy [facility] for tasks assessed/performed   ? ?  ?  ? ?  ? ? ?Past Medical History:  ?Diagnosis Date  ? Acquired solitary kidney   ? right side 2014  ? Arthritis   ? Frequency of urination   ? Heart murmur   ? per told by pcp approx. 02/ 2022 heard a very faint murmur, told did need work-up done at this time  ? History of Hashimoto thyroiditis   ? s/p  total thyroidectomy 1995  ? History of hypercalcemia   ? s/p  parathryoidectomy 07/ 2021  ? History of renal cell carcinoma 2014  ? s/p  left nephrectomy, pt stated no other treatment  ? Hypertension   ? followed by pcp  ? Hypothyroidism, postsurgical 1995  ? followed by pcp in [place], moved from [place] 01/ 2021  ? OSA on CPAP   ? Thickened endometrium   ? Wears glasses   ? ? ?Past Surgical History:  ?Procedure Laterality Date  ? BREAST BIOPSY Left 08/24/2021  ? HYSTEROSCOPY WITH D & C N/A 04/08/2021  ? Procedure: DILATATION AND CURETTAGE /HYSTEROSCOPY, EXCISION OF VAGINAL LESION;  Surgeon: Delsa Bern, MD;  Location: [facility];  Service: Gynecology;  Laterality: N/A;  ? LAPAROSCOPIC CHOLECYSTECTOMY  2005  ? NEPHRECTOMY Left 2014  ? PARATHYROIDECTOMY  07/ 2021  in [place]  ? TONSILLECTOMY  age 47  ? TOTAL HIP  ARTHROPLASTY Bilateral left 2015;  right 2009  ? TOTAL KNEE ARTHROPLASTY Left 2014  ? TOTAL THYROIDECTOMY Bilateral 1995  ? ? ?There were no vitals filed for this visit. ? ? Subjective Assessment - 02/16/22 1020   ? ? Subjective Pt fell on Friday while standing in kitchen and sidestepping. She states she has no fractures, just feels "bruised up"   ? Patient Stated Goals Be able to get in/out of bed and sleep without hip pain   ? Currently in Pain? Yes   ? Pain Score 6    ? Pain Location Buttocks   ? Pain Orientation Left   ? Pain Descriptors / Indicators Sore   ? ?  ?  ? ?  ? ? ? ? ? [facility] PT Assessment - 02/16/22 0001   ? ?  ? Assessment  ? Medical Diagnosis physical deconditioning, Lt sciatic nerve pain   ? Referring Provider (PT) [place], [place]   ? Onset Date/Surgical Date 12/05/21   ? Hand Dominance Right   ? Prior Therapy for shoulder and knee   ?  ? Strength  ? Right Hip Flexion 4-/5   ? Right Hip ABduction 4/5   ? Left Hip Flexion 3+/5   ? Left Hip ABduction 4/5   ?  Left Knee Flexion 4/5   ? Left Knee Extension 4+/5   ? ?  ?  ? ?  ? ? ? ? ? ? ? ? ? ? ? ? ? ? ? ? [place] Adult PT Treatment/Exercise - 02/16/22 0001   ? ?  ? Transfers  ? Comments supine <>sit with cues for core bracing   ?  ? Lumbar Exercises: Stretches  ? Lower Trunk Rotation 5 reps;10 seconds   ? Lower Trunk Rotation Limitations knees together   ? Piriformis Stretch Right;Left;30 seconds;2 reps   ? Other Lumbar Stretch Exercise seated forward flexion, rolling rollator forward x 10 sec x 5 reps   ?  ? Lumbar Exercises: Supine  ? Ab Set 5 reps;5 seconds   ? AB Set Limitations ab set with clam x 10   ? Bent Knee Raise 20 reps   ? Bent Knee Raise Limitations unable to tolerate green TB due to bruising after fall   with ab set  ?  ? Knee/Hip Exercises: Stretches  ? Passive Hamstring Stretch Right;Left;2 reps;20 seconds   ?  ? Manual Therapy  ? Manual therapy comments self massage with tennis ball for glutes and piriformis   ? ?  ?  ? ?   ? ? ? ? ? ? ? ? ? ? ? ? ? ? ? PT Long Term Goals - 02/16/22 1034   ? ?  ? PT LONG TERM GOAL #1  ? Title Pt will be independent with HEP   ? Time 6   ? Period Weeks   ? Status On-going   ? Target Date 03/30/22   ?  ? PT LONG TERM GOAL #2  ? Title Pt will report being able to get in/out of bed with pain <= 1/10   ? Baseline 5/10 on 02/16/22   ? Time 6   ? Period Weeks   ? Status On-going   ? Target Date 03/30/22   ?  ? PT LONG TERM GOAL #3  ? Title Pt will improve LE strength to 4/5 to improve activity tolerance   ? Time 6   ? Period Weeks   ? Status New   ? Target Date 03/30/22   ? ?  ?  ? ?  ? ? ? ? ? ? ? ? Plan - 02/16/22 1056   ? ? Clinical Impression Statement Pt is making slow progress towards goals. She is limited by decreased attendance at therapy due to being the primary caregiver for her husband. She has improved LE strength, continues with glute and Lt hip pain   ? PT Next Visit Plan assess HEP, progress hip/core strength and flexibility   ? PT [place]   ? Consulted and Agree with Plan of Care Patient   ? ?  ?  ? ?  ? ? ?Patient will benefit from skilled therapeutic intervention in order to improve the following deficits and impairments:    ? ?Visit Diagnosis: ?Muscle weakness (generalized) - Plan: PT plan of care cert/re-cert ? ?Pain in left hip - Plan: PT plan of care cert/re-cert ? ?Other symptoms and signs involving the musculoskeletal system - Plan: PT plan of care cert/re-cert ? ? ? ? ?Problem List ?Patient Active Problem List  ? Diagnosis Date Noted  ? Malignant neoplasm of upper-outer quadrant of left breast in female, estrogen receptor positive ([place]) 08/27/2021  ? Abnormal ultrasound of endometrium 04/08/2021  ? ? ?Cara Thaxton, PT ?02/16/2022, 11:00 AM ? ?[facility] ?  [facility] ?[place] ?[place], [place], [address] ?Phone: [phone]   Fax:  [phone] ? ?Name: Brooke Duncan ?MRN: [id number] ?Date of Birth:  [birth year] ? ? ? ?

## 2022-02-21 ENCOUNTER — Ambulatory Visit: Payer: Medicare Other | Admitting: Physical Therapy

## 2022-02-21 ENCOUNTER — Encounter: Payer: Self-pay | Admitting: Physical Therapy

## 2022-02-21 ENCOUNTER — Other Ambulatory Visit: Payer: Self-pay

## 2022-02-21 DIAGNOSIS — R29898 Other symptoms and signs involving the musculoskeletal system: Secondary | ICD-10-CM

## 2022-02-21 DIAGNOSIS — M25552 Pain in left hip: Secondary | ICD-10-CM

## 2022-02-21 DIAGNOSIS — M6281 Muscle weakness (generalized): Secondary | ICD-10-CM

## 2022-02-21 NOTE — Therapy (Signed)
[facility] ?[facility] ?[place] ?[place], [place], [address] ?Phone: [phone]   Fax:  [phone] ? ?Physical Therapy Treatment ? ?Patient Details  ?Name: Brooke Duncan ?MRN: [id number] ?Date of Birth: 08/27/1946 ?Referring Provider (PT): Rankins, [place] ? ? ?Encounter Date: 02/21/2022 ? ? PT End of Session - 02/21/22 1019   ? ? Visit Number 5   ? Number of Visits 12   ? Date for PT Re-Evaluation 03/30/22   ? Authorization Type medicare   ? Progress Note Due on Visit 10   ? PT Start Time 1019   ? PT Stop Time 1101   ? PT Time Calculation (min) 42 min   ? Activity Tolerance Patient tolerated treatment well   ? Behavior During Therapy [facility] for tasks assessed/performed   ? ?  ?  ? ?  ? ? ?Past Medical History:  ?Diagnosis Date  ? Acquired solitary kidney   ? right side 2014  ? Arthritis   ? Frequency of urination   ? Heart murmur   ? per told by pcp approx. 02/ 2022 heard a very faint murmur, told did need work-up done at this time  ? History of Hashimoto thyroiditis   ? s/p  total thyroidectomy 1995  ? History of hypercalcemia   ? s/p  parathryoidectomy 07/ 2021  ? History of renal cell carcinoma 2014  ? s/p  left nephrectomy, pt stated no other treatment  ? Hypertension   ? followed by pcp  ? Hypothyroidism, postsurgical 1995  ? followed by pcp in [place], moved from [place] 01/ 2021  ? OSA on CPAP   ? Thickened endometrium   ? Wears glasses   ? ? ?Past Surgical History:  ?Procedure Laterality Date  ? BREAST BIOPSY Left 08/24/2021  ? HYSTEROSCOPY WITH D & C N/A 04/08/2021  ? Procedure: DILATATION AND CURETTAGE /HYSTEROSCOPY, EXCISION OF VAGINAL LESION;  Surgeon: Delsa Bern, MD;  Location: [facility];  Service: Gynecology;  Laterality: N/A;  ? LAPAROSCOPIC CHOLECYSTECTOMY  2005  ? NEPHRECTOMY Left 2014  ? PARATHYROIDECTOMY  07/ 2021  in [place]  ? TONSILLECTOMY  age 89  ? TOTAL HIP ARTHROPLASTY Bilateral left 2015;  right 2009  ? TOTAL KNEE  ARTHROPLASTY Left 2014  ? TOTAL THYROIDECTOMY Bilateral 1995  ? ? ?There were no vitals filed for this visit. ? ? Subjective Assessment - 02/21/22 1019   ? ? Subjective I'm getting better. Still sore from fall. Patient reporting return of left shoulder pain after falling on it. It's sore but also pinching.   ? Pertinent History Pt has bilat hip replacements, she needs a Rt knee replacement but is waiting. She has Lt breast cancer and is undergoing treatments.   ? Patient Stated Goals Be able to get in/out of bed and sleep without hip pain   ? Currently in Pain? Yes   ? Pain Score 4    ? Pain Location Buttocks   ? Pain Orientation Left   ? Pain Descriptors / Indicators Sore   ? Pain Score 6   ? Pain Location Shoulder   ? Pain Orientation Left   ? Pain Descriptors / Indicators Sore   ? Pain Type Chronic pain   ? ?  ?  ? ?  ? ? ? ? ? ? ? ? ? ? ? ? ? ? ? ? ? ? ? ? [facility] Adult PT Treatment/Exercise - 02/21/22 0001   ? ?  ? Self-Care  ? Self-Care  Other Self-Care Comments   ? Other Self-Care Comments  self massage with tennis ball for bil QL   ?  ? Lumbar Exercises: Aerobic  ? Nustep L5: arms/legs x 6 min for warm up and conditioning   ?  ? Lumbar Exercises: Seated  ? Sit to Stand 10 reps   ? Sit to Stand Limitations GTB   ? Other Seated Lumbar Exercises clams - R/L/both with green band on thighs x 10 each, then marching with green band on thighs x 10, 2 sets   ?  ? Lumbar Exercises: Supine  ? Bent Knee Raise 20 reps   ? Bent Knee Raise Limitations in reclined position on mat due to rib pain   ?  ? Knee/Hip Exercises: Stretches  ? Other Knee/Hip Stretches modified piriformis stretch seated with foot on 8 inch step 2x 30 sec ea   ?  ? Knee/Hip Exercises: Standing  ? Heel Raises Both;10 reps   ? Hip Abduction 20 reps;Right   ? Hip Extension 20 reps;Right   ? Other Standing Knee Exercises march x 10  right   ? ?  ?  ? ?  ? ? ? ? ? ? ? ? ? ? PT Education - 02/21/22 1308   ? ? Education Details HEP progressed   ? Person(s)  Educated Patient   ? Methods Explanation;Demonstration;Handout   ? Comprehension Verbalized understanding;Returned demonstration   ? ?  ?  ? ?  ? ? ? ? ? ? PT Long Term Goals - 02/16/22 1034   ? ?  ? PT LONG TERM GOAL #1  ? Title Pt will be independent with HEP   ? Time 6   ? Period Weeks   ? Status On-going   ? Target Date 03/30/22   ?  ? PT LONG TERM GOAL #2  ? Title Pt will report being able to get in/out of bed with pain <= 1/10   ? Baseline 5/10 on 02/16/22   ? Time 6   ? Period Weeks   ? Status On-going   ? Target Date 03/30/22   ?  ? PT LONG TERM GOAL #3  ? Title Pt will improve LE strength to 4/5 to improve activity tolerance   ? Time 6   ? Period Weeks   ? Status New   ? Target Date 03/30/22   ? ?  ?  ? ?  ? ? ? ? ? ? ? ? Plan - 02/21/22 1309   ? ? Clinical Impression Statement Pt still limited by pain from fall. She responded very well to [place] with ball to bil QL, left greater than right,  which has been preventing her from lying on her back at home in addition to ant rib pain caused from EMT lifting her. She is unable to lie on either side as well so modified pigeon stretch issued to address this.   ? PT Frequency 2x / week   ? PT Duration 6 weeks   ? PT Treatment/Interventions Passive range of motion;Taping;Dry needling;Manual techniques;Patient/family education;Therapeutic exercise;Neuromuscular re-education;Balance training;Therapeutic activities;Moist Heat;Cryotherapy;Iontophoresis '4mg'$ /ml Dexamethasone;Electrical Stimulation   ? PT Next Visit Plan progress hip/core strength and flexibility   ? PT [place]   ? ?  ?  ? ?  ? ? ?Patient will benefit from skilled therapeutic intervention in order to improve the following deficits and impairments:  Pain, Impaired flexibility, Decreased strength, Decreased activity tolerance, Decreased balance, Difficulty walking, Increased muscle spasms, Decreased mobility ? ?  Visit Diagnosis: ?Muscle weakness (generalized) ? ?Pain in left hip ? ?Other  symptoms and signs involving the musculoskeletal system ? ? ? ? ?Problem List ?Patient Active Problem List  ? Diagnosis Date Noted  ? Malignant neoplasm of upper-outer quadrant of left breast in female, estrogen receptor positive ([place]) 08/27/2021  ? Abnormal ultrasound of endometrium 04/08/2021  ? ?Senaida Lange, PT ?02/21/2022, 1:15 PM ? ?[facility] ?[facility] ?[place] ?[place], [place], [address] ?Phone: [phone]   Fax:  [phone] ? ?Name: Brooke Duncan ?MRN: [id number] ?Date of Birth: 12/25/1945 ? ? ? ?

## 2022-02-21 NOTE — Patient Instructions (Signed)
Access Code: [email or web address] ?URL: [email or web address] ?Date: 02/21/2022 ?Prepared by: Almyra Free ? ?Exercises ?Seated Transversus Abdominis Bracing - 1 x daily - 7 x weekly - 1 sets - 10 reps - 5 seconds hold ?Seated Hamstring Stretch - 1 x daily - 7 x weekly - 1 sets - 3 reps - 20-30 seconds hold ?Standing 'L' Stretch at Counter - 1 x daily - 7 x weekly - 1 sets - 3 reps - 20-30 seconds hold ?Supine Figure 4 Piriformis Stretch - 1 x daily - 7 x weekly - 1 sets - 3 reps - 20-30 seconds hold ?Hooklying Single Knee to Chest - 1 x daily - 7 x weekly - 1 sets - 3 reps - 20-30 seconds hold ?Hooklying Clamshell with Resistance - 1 x daily - 7 x weekly - 1-2 sets - 10 reps ?Supine Hip Internal and External Rotation - 1 x daily - 7 x weekly - 1 sets - 3 reps - 10 sec hold ?Seated Hip External Rotation AROM - 2 x daily - 7 x weekly - 1 sets - 2 reps - 30-60 sec hold ? ?

## 2022-02-23 ENCOUNTER — Encounter: Payer: Medicare Other | Admitting: Physical Therapy

## 2022-02-23 ENCOUNTER — Encounter: Payer: Self-pay | Admitting: Hematology and Oncology

## 2022-02-25 ENCOUNTER — Ambulatory Visit: Payer: Medicare Other | Admitting: Physical Therapy

## 2022-02-25 ENCOUNTER — Other Ambulatory Visit: Payer: Self-pay

## 2022-02-25 DIAGNOSIS — M25552 Pain in left hip: Secondary | ICD-10-CM

## 2022-02-25 DIAGNOSIS — R29898 Other symptoms and signs involving the musculoskeletal system: Secondary | ICD-10-CM

## 2022-02-25 DIAGNOSIS — M6281 Muscle weakness (generalized): Secondary | ICD-10-CM | POA: Diagnosis not present

## 2022-02-25 NOTE — Therapy (Addendum)
[place], [place], [address] Phone: [phone]   Fax:  [phone]  Physical Therapy Treatment and Discharge Summary  Patient Details  Name: Brooke Duncan MRN: [id number] Date of Birth: [birth year] Referring Provider (PT): Radene Ou, [place]   Encounter Date: 02/25/2022   PT End of Session - 02/25/22 1059     Visit Number 6    Number of Visits 12    Date for PT Re-Evaluation 03/30/22    Authorization Type medicare    Authorization - Visit Number 5    Progress Note Due on Visit 10    PT Start Time 1020    PT Stop Time 1100    PT Time Calculation (min) 40 min    Activity Tolerance Patient tolerated treatment well    Behavior During Therapy [facility] for tasks assessed/performed             Past Medical History:  Diagnosis Date   Acquired solitary kidney    right side 2014   Arthritis    Frequency of urination    Heart murmur    per told by pcp approx. 02/ 2022 heard a very faint murmur, told did need work-up done at this time   History of Hashimoto thyroiditis    s/p  total thyroidectomy 1995   History of hypercalcemia    s/p  parathryoidectomy 07/ 2021   History of renal cell carcinoma 2014   s/p  left nephrectomy, pt stated no other treatment   Hypertension    followed by pcp   Hypothyroidism, postsurgical 1995   followed by pcp in [place], moved from [place] 01/ 2021   OSA on CPAP    Thickened endometrium    Wears glasses     Past Surgical History:  Procedure Laterality Date   BREAST BIOPSY Left 08/24/2021   HYSTEROSCOPY WITH D & C N/A 04/08/2021   Procedure: DILATATION AND CURETTAGE /HYSTEROSCOPY, EXCISION OF VAGINAL LESION;  Surgeon: Delsa Bern, MD;  Location: [facility];  Service: Gynecology;  Laterality: N/A;   LAPAROSCOPIC CHOLECYSTECTOMY  2005   NEPHRECTOMY Left 2014   PARATHYROIDECTOMY  07/ 2021  in [place]  age 38   TOTAL HIP  ARTHROPLASTY Bilateral left 2015;  right 2009   TOTAL KNEE ARTHROPLASTY Left 2014   TOTAL THYROIDECTOMY Bilateral 1995    There were no vitals filed for this visit.   Subjective Assessment - 02/25/22 1027     Subjective Pt states "I feel like I need to work out". Pt states her hips are sore but she feels like exercise helps    Patient Stated Goals Be able to get in/out of bed and sleep without hip pain    Currently in Pain? Yes    Pain Score 4     Pain Location Buttocks                [facility] PT Assessment - 02/25/22 0001       Assessment   Medical Diagnosis physical deconditioning, Lt sciatic nerve pain    Referring Provider (PT) [place], [place]    Onset Date/Surgical Date 12/05/21    Hand Dominance Right    Prior Therapy for shoulder and knee      Strength   Right Hip ABduction 4/5    Left Hip ABduction 4/5  [place] Adult PT Treatment/Exercise - 02/25/22 0001       Lumbar Exercises: Aerobic   Nustep L5 x 7 min for conditioning      Lumbar Exercises: Seated   Sit to Stand 10 reps    Other Seated Lumbar Exercises seated pallof red TB x 6 bilat, seated shoulder extension red TB x 15. diagonals 1kg ball x 10 bilat    Other Seated Lumbar Exercises clams - R/L/both with green band on thighs x 10 each, then marching with green band on thighs x 10, 2 sets      Knee/Hip Exercises: Stretches   Other Knee/Hip Stretches modified piriformis stretch seated with foot on 8 inch step 2x 30 sec ea      Knee/Hip Exercises: Standing   Heel Raises Both;10 reps    Hip Abduction 20 reps;Right    Hip Extension 20 reps;Right    Other Standing Knee Exercises marching x 10 bilat                          PT Long Term Goals - 02/16/22 1034       PT LONG TERM GOAL #1   Title Pt will be independent with HEP    Time 6    Period Weeks    Status On-going    Target Date 03/30/22      PT LONG TERM GOAL #2   Title Pt will  report being able to get in/out of bed with pain <= 1/10    Baseline 5/10 on 02/16/22    Time 6    Period Weeks    Status On-going    Target Date 03/30/22      PT LONG TERM GOAL #3   Title Pt will improve LE strength to 4/5 to improve activity tolerance    Time 6    Period Weeks    Status New    Target Date 03/30/22                   Plan - 02/25/22 1059     Clinical Impression Statement Pt with good tolerance to progression of core exercises. Some increased pain with standing on Rt LE for Lt LE marching. She feels good relief with pigeon stretch and self massage with ball    PT Next Visit Plan progress hip/core strength and flexibility    PT Home Exercise Plan [email or web address]    Consulted and Agree with Plan of Care Patient             Patient will benefit from skilled therapeutic intervention in order to improve the following deficits and impairments:     Visit Diagnosis: Muscle weakness (generalized)  Pain in left hip  Other symptoms and signs involving the musculoskeletal system     Problem List Patient Active Problem List   Diagnosis Date Noted   Malignant neoplasm of upper-outer quadrant of left breast in female, estrogen receptor positive ([place]) 08/27/2021   Abnormal ultrasound of endometrium 04/08/2021    Larance Ratledge, PT 02/25/2022, 11:01 AM  [facility] [address] [place], [place], [address] Phone: [phone]   Fax:  [phone]  Name: Brooke Duncan MRN: [id number] Date of Birth: Feb 13, 1946  PHYSICAL THERAPY DISCHARGE SUMMARY  Visits from Start of Care: 6  Current functional level related to goals / functional outcomes: unknown   Remaining deficits: unknown   Education / Equipment: HEP   Patient agrees to discharge. Patient  goals were not met. Patient is being discharged due to not returning since the last visit.  Madelyn Flavors, PT 10/13/22 12:04 PM  [facility]  Outpatient Rehab at [place], [place] [address]  [phone] (office) [phone] (fax)

## 2022-03-09 ENCOUNTER — Other Ambulatory Visit: Payer: Self-pay | Admitting: Hematology and Oncology

## 2022-03-09 ENCOUNTER — Ambulatory Visit
Admission: RE | Admit: 2022-03-09 | Discharge: 2022-03-09 | Disposition: A | Discharge status: Home / Self Care | Payer: Medicare Other | Source: Ambulatory Visit | Attending: Hematology and Oncology | Admitting: Hematology and Oncology

## 2022-03-09 DIAGNOSIS — Z170 Estrogen receptor positive status [ER+]: Secondary | ICD-10-CM

## 2022-03-09 NOTE — Progress Notes (Signed)
? ?Patient Care Team: ?Lawerance Cruel, MD as PCP - General (Family Medicine) ?Mauro Kaufmann, RN as Oncology Nurse Navigator ?Rockwell Germany, RN as Oncology Nurse Navigator ?Rolm Bookbinder, MD as Consulting Physician (General Surgery) ?Nicholas Lose, MD as Consulting Physician (Hematology and Oncology) ?Gery Pray, MD as Consulting Physician (Radiation Oncology) ? ?DIAGNOSIS:  ?Encounter Diagnosis  ?Name Primary?  ? Malignant neoplasm of upper-outer quadrant of left breast in female, estrogen receptor positive ([place])   ? ? ?SUMMARY OF ONCOLOGIC HISTORY: ?Oncology History  ?Malignant neoplasm of upper-outer quadrant of left breast in female, estrogen receptor positive ([place])  ?08/24/2021 Initial Diagnosis  ? MRI Breast: a triangular shaped area of enhancement measuring 4.4 x 2.2 x 1.8 cm, a linear area of low level non mass enhancement spanning 5.2 x 1.8 x 0.9 cm, and smaller rounded area of non mass enhancement spanning 2.3 x 2.4 x 1.6 cm in the left breast. 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 ?  ?08/25/2021 -  Anti-estrogen oral therapy  ? Neoadjuvant letrozole ?  ?09/01/2021 Cancer Staging  ? Staging form: Breast, AJCC 8th Edition ?- Clinical stage from 09/01/2021: Stage IIA (cT3, cN0, cM0, G2, ER+, PR+, HER2-) - Signed by Nicholas Lose, MD on 09/01/2021 ?Stage prefix: Initial diagnosis ?Histologic grading system: 3 grade system ? ?  ? ? ?CHIEF COMPLIANT: Follow-up of left breast cancer on neoadjuvant letrozole therapy ? ?INTERVAL HISTORY: Brooke Duncan is a 76 y.o. with above-mentioned history of left breast cancer. She presents to the clinic today for follow-up. She denies side effects just some hot flashes but its manageable. She complained of issues of her right shoulder. ? ? ?ALLERGIES:  is allergic to tape. ? ?MEDICATIONS:  ?Current Outpatient Medications  ?Medication Sig Dispense Refill  ? cephALEXin (KEFLEX) 500 MG  capsule Take 1 capsule (500 mg total) by mouth daily.    ? tamsulosin (FLOMAX) 0.4 MG CAPS capsule Take 1 capsule (0.4 mg total) by mouth daily. 30 capsule   ? acetaminophen (TYLENOL) 500 MG tablet Take 2 tablets (1,000 mg total) by mouth every 6 (six) hours as needed. 30 tablet 0  ? Cholecalciferol (VITAMIN D3 SUPER STRENGTH) 50 MCG (2000 UT) CAPS Take by mouth daily.    ? clotrimazole (LOTRIMIN) 1 % cream Apply 1 application topically 2 (two) times daily as needed.    ? CRANBERRY PO Take 2 capsules by mouth daily.    ? D-MANNOSE PO Take 4 capsules by mouth daily.    ? erythromycin ophthalmic ointment 1 application as needed.    ? hydrocortisone 2.5 % cream Apply topically 2 (two) times daily as needed.    ? ibuprofen (ADVIL) 600 MG tablet Take 1 tablet (600 mg total) by mouth every 6 (six) hours as needed. 30 tablet 0  ? letrozole (FEMARA) 2.5 MG tablet Take 1 tablet (2.5 mg total) by mouth daily. 90 tablet 3  ? levothyroxine (SYNTHROID) 150 MCG tablet Take 150 mcg by mouth daily before breakfast.    ? losartan (COZAAR) 25 MG tablet Take 25 mg by mouth daily.    ? oxyCODONE-acetaminophen (PERCOCET/ROXICET) 5-325 MG tablet Take 1 tablet by mouth every 8 (eight) hours as needed for severe pain. 6 tablet 0  ? PRESCRIPTION MEDICATION 4 capsules daily. D-Mannose    ? simvastatin (ZOCOR) 10 MG tablet Take 10 mg by mouth at bedtime.    ? vitamin B-12 (CYANOCOBALAMIN) 1000 MCG tablet Take 1,000 mcg by mouth daily.    ? ?  No current facility-administered medications for this visit.  ? ? ?PHYSICAL EXAMINATION: ?ECOG PERFORMANCE STATUS: 1 - Symptomatic but completely ambulatory ? ?Vitals:  ? 03/10/22 1026  ?BP: (!) 146/70  ?Pulse: 73  ?Resp: 18  ?Temp: 97.9 ?F (36.6 ?C)  ?SpO2: 97%  ? ?Filed Weights  ? 03/10/22 1026  ?Weight: 268 lb 1.6 oz (121.6 kg)  ? ?  ? ?LABORATORY DATA:  ?I have reviewed the data as listed ? ?  Latest Ref Rng & Units 09/01/2021  ? 12:44 PM 04/06/2021  ? 12:59 PM  ?CMP  ?Glucose 70 - 99 mg/dL 98   81     ?BUN 8 - 23 mg/dL 13   14    ?Creatinine 0.44 - 1.00 mg/dL 0.76   0.67    ?Sodium 135 - 145 mmol/L 140   143    ?Potassium 3.5 - 5.1 mmol/L 4.4   4.5    ?Chloride 98 - 111 mmol/L 105   109    ?CO2 22 - 32 mmol/L 26   29    ?Calcium 8.9 - 10.3 mg/dL 9.4   9.4    ?Total Protein 6.5 - 8.1 g/dL 6.9     ?Total Bilirubin 0.3 - 1.2 mg/dL 0.7     ?Alkaline Phos 38 - 126 U/L 79     ?AST 15 - 41 U/L 12     ?ALT 0 - 44 U/L 17     ? ? ?Lab Results  ?Component Value Date  ? WBC 8.1 09/01/2021  ? HGB 13.7 09/01/2021  ? HCT 43.5 09/01/2021  ? MCV 87.9 09/01/2021  ? PLT 161 09/01/2021  ? NEUTROABS 6.0 09/01/2021  ? ? ?ASSESSMENT & PLAN:  ?Malignant neoplasm of upper-outer quadrant of left breast in female, estrogen receptor positive ([place]) ?08/24/2021: Screening mammogram revealed left breast asymmetry at 2 o'clock position measuring 1.6 cm.  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  MRI detected 2 additional masses measuring 5.2 cm and 2.4 cm (need biopsied) no lymphadenopathy ?  ?Recommendations:  ?1.  Neoadjuvant antiestrogen therapy with letrozole (until additional biopsies were performed) ?Breast conserving surgery versus mastectomy with sentinel lymph node biopsy (if the MRI biopsies of the additional lesions are negative then patient will be treated with neoadjuvant antiestrogen therapy and for 6 months followed by lumpectomy) ?2. Oncotype DX: 21 (7% distant recurrence at 9 years) ?3. Adjuvant radiation therapy followed by ?4. Adjuvant antiestrogen therapy ?---------------------------------------------------------------------------------------------------------------------------------------- ?Mammogram and ultrasound revealed slightly smaller size of the left breast lesion ?She has an appointment to see her surgeon in 2 weeks. ?Decisions will have to be made about the type of surgery. ? ?Return to clinic  after surgery to discuss the final pathology report. ? ? ?No orders of the defined types were placed in this encounter. ? ?The patient has a good understanding of the overall plan. she agrees with it. she will call with any problems that may develop before the next visit here. ?Total time spent: 30 mins including face to face time and time spent for planning, charting and co-ordination of care ? ? Harriette Ohara, MD ?03/10/22 ? ? ? I Gardiner Coins am scribing for Dr. Lindi Adie ? ?I have reviewed the above documentation for accuracy and completeness, and I agree with the above. ?  ?

## 2022-03-10 ENCOUNTER — Other Ambulatory Visit: Payer: Self-pay

## 2022-03-10 ENCOUNTER — Inpatient Hospital Stay: Payer: Medicare Other | Attending: Hematology and Oncology | Admitting: Hematology and Oncology

## 2022-03-10 DIAGNOSIS — C50412 Malignant neoplasm of upper-outer quadrant of left female breast: Secondary | ICD-10-CM | POA: Diagnosis present

## 2022-03-10 DIAGNOSIS — Z170 Estrogen receptor positive status [ER+]: Secondary | ICD-10-CM | POA: Diagnosis not present

## 2022-03-10 DIAGNOSIS — Z79899 Other long term (current) drug therapy: Secondary | ICD-10-CM | POA: Insufficient documentation

## 2022-03-10 MED ORDER — TAMSULOSIN HCL 0.4 MG PO CAPS: 0.4000 mg | ORAL_CAPSULE | Freq: Every day | ORAL | Status: DC

## 2022-03-10 MED ORDER — CEPHALEXIN 500 MG PO CAPS: 500.0000 mg | ORAL_CAPSULE | Freq: Every day | ORAL | Status: DC

## 2022-03-10 NOTE — Assessment & Plan Note (Signed)
08/24/2021: Screening mammogram revealed left breast asymmetry at 2 o'clock position measuring 1.6 cm. ?Breast MRI revealed that area to have a 4.4 cm mass which on biopsy came back as grade 2 invasive lobular cancer with LCIS ER 70%, PR 10%, HER2 negative, Ki-67 15%. ?MRI detected 2 additional masses measuring 5.2 cm and 2.4 cm (need biopsied) no lymphadenopathy ?? ?Recommendations:  ?1.??Neoadjuvant antiestrogen therapy with letrozole (until additional biopsies were performed) ?Breast conserving surgery?versus mastectomy with sentinel lymph node biopsy?(if the MRI biopsies of the additional lesions are negative then patient will be treated with neoadjuvant antiestrogen therapy and for 6 months followed by lumpectomy) ?2. Oncotype DX: 21 (7% distant recurrence at 9 years) ?3. Adjuvant radiation therapy followed by ?4. Adjuvant antiestrogen therapy ?---------------------------------------------------------------------------------------------------------------------------------------- ? ?

## 2022-03-11 ENCOUNTER — Encounter: Payer: Self-pay | Admitting: *Deleted

## 2022-03-29 ENCOUNTER — Other Ambulatory Visit: Payer: Self-pay | Admitting: General Surgery

## 2022-03-29 DIAGNOSIS — C50412 Malignant neoplasm of upper-outer quadrant of left female breast: Secondary | ICD-10-CM

## 2022-03-30 ENCOUNTER — Encounter: Payer: Self-pay | Admitting: *Deleted

## 2022-03-31 ENCOUNTER — Telehealth: Payer: Self-pay | Admitting: Hematology and Oncology

## 2022-03-31 ENCOUNTER — Other Ambulatory Visit: Payer: Self-pay | Admitting: General Surgery

## 2022-03-31 DIAGNOSIS — Z170 Estrogen receptor positive status [ER+]: Secondary | ICD-10-CM

## 2022-03-31 NOTE — Telephone Encounter (Signed)
Per 4/26 in basket called and spoke to pt about appointment.  Pt confirmed appointment  ?

## 2022-04-05 ENCOUNTER — Encounter: Payer: Self-pay | Admitting: *Deleted

## 2022-04-05 ENCOUNTER — Telehealth: Payer: Self-pay | Admitting: *Deleted

## 2022-04-05 NOTE — Telephone Encounter (Signed)
Spoke to pt regarding radioactive seed placement and as well as timing of placement. Discussed post op supportive bra as well as provided information on places to purchase and their contact information. ?Denies further needs at this time. ?

## 2022-04-28 ENCOUNTER — Other Ambulatory Visit: Payer: Self-pay

## 2022-04-28 ENCOUNTER — Encounter (HOSPITAL_BASED_OUTPATIENT_CLINIC_OR_DEPARTMENT_OTHER): Payer: Self-pay | Admitting: General Surgery

## 2022-05-09 ENCOUNTER — Ambulatory Visit (HOSPITAL_BASED_OUTPATIENT_CLINIC_OR_DEPARTMENT_OTHER): Admission: RE | Admit: 2022-05-09 | Payer: Medicare Other | Source: Home / Self Care | Admitting: General Surgery

## 2022-05-09 DIAGNOSIS — I10 Essential (primary) hypertension: Secondary | ICD-10-CM

## 2022-05-09 DIAGNOSIS — E119 Type 2 diabetes mellitus without complications: Secondary | ICD-10-CM

## 2022-05-09 HISTORY — DX: Other complications of anesthesia, initial encounter: T88.59XA

## 2022-05-09 SURGERY — BREAST LUMPECTOMY WITH RADIOACTIVE SEED LOCALIZATION
Anesthesia: General | Site: Breast | Laterality: Left

## 2022-05-16 ENCOUNTER — Encounter: Payer: Self-pay | Admitting: *Deleted

## 2022-05-17 ENCOUNTER — Telehealth: Payer: Self-pay | Admitting: Hematology and Oncology

## 2022-05-17 NOTE — Telephone Encounter (Signed)
.  Called patient to schedule appointment per 6/12 inbasket, patient is aware of date and time.

## 2022-05-25 ENCOUNTER — Ambulatory Visit: Payer: Medicare Other | Admitting: Hematology and Oncology

## 2022-05-26 ENCOUNTER — Encounter (HOSPITAL_BASED_OUTPATIENT_CLINIC_OR_DEPARTMENT_OTHER): Payer: Self-pay | Admitting: Emergency Medicine

## 2022-05-26 ENCOUNTER — Other Ambulatory Visit: Payer: Self-pay

## 2022-05-26 ENCOUNTER — Emergency Department (HOSPITAL_BASED_OUTPATIENT_CLINIC_OR_DEPARTMENT_OTHER): Payer: Medicare Other | Admitting: Radiology

## 2022-05-26 ENCOUNTER — Emergency Department (HOSPITAL_BASED_OUTPATIENT_CLINIC_OR_DEPARTMENT_OTHER): Payer: Medicare Other

## 2022-05-26 ENCOUNTER — Emergency Department (INDEPENDENT_AMBULATORY_CARE_PROVIDER_SITE_OTHER)
Admission: EM | Admit: 2022-05-26 | Discharge: 2022-05-26 | Disposition: A | Discharge status: Home / Self Care | Payer: Medicare Other | Source: Home / Self Care

## 2022-05-26 ENCOUNTER — Emergency Department (HOSPITAL_BASED_OUTPATIENT_CLINIC_OR_DEPARTMENT_OTHER)
Admission: EM | Admit: 2022-05-26 | Discharge: 2022-05-26 | Disposition: A | Discharge status: Home / Self Care | Payer: Medicare Other | Attending: Emergency Medicine | Admitting: Emergency Medicine

## 2022-05-26 ENCOUNTER — Encounter: Payer: Self-pay | Admitting: Emergency Medicine

## 2022-05-26 DIAGNOSIS — M7989 Other specified soft tissue disorders: Secondary | ICD-10-CM | POA: Diagnosis present

## 2022-05-26 DIAGNOSIS — Z853 Personal history of malignant neoplasm of breast: Secondary | ICD-10-CM | POA: Diagnosis not present

## 2022-05-26 DIAGNOSIS — R600 Localized edema: Secondary | ICD-10-CM | POA: Insufficient documentation

## 2022-05-26 LAB — CBC WITH DIFFERENTIAL/PLATELET
Abs Immature Granulocytes: 0.03 10*3/uL (ref 0.00–0.07)
Basophils Absolute: 0 10*3/uL (ref 0.0–0.1)
Basophils Relative: 0 %
Eosinophils Absolute: 0.4 10*3/uL (ref 0.0–0.5)
Eosinophils Relative: 4 %
HCT: 42.5 % (ref 36.0–46.0)
Hemoglobin: 13.1 g/dL (ref 12.0–15.0)
Immature Granulocytes: 0 %
Lymphocytes Relative: 23 %
Lymphs Abs: 2.1 10*3/uL (ref 0.7–4.0)
MCH: 26.7 pg (ref 26.0–34.0)
MCHC: 30.8 g/dL (ref 30.0–36.0)
MCV: 86.7 fL (ref 80.0–100.0)
Monocytes Absolute: 0.6 10*3/uL (ref 0.1–1.0)
Monocytes Relative: 6 %
Neutro Abs: 5.9 10*3/uL (ref 1.7–7.7)
Neutrophils Relative %: 67 %
Platelets: 141 10*3/uL — ABNORMAL LOW (ref 150–400)
RBC: 4.9 MIL/uL (ref 3.87–5.11)
RDW: 16.6 % — ABNORMAL HIGH (ref 11.5–15.5)
WBC: 8.9 10*3/uL (ref 4.0–10.5)
nRBC: 0 % (ref 0.0–0.2)

## 2022-05-26 LAB — BASIC METABOLIC PANEL
Anion gap: 7 (ref 5–15)
BUN: 15 mg/dL (ref 8–23)
CO2: 29 mmol/L (ref 22–32)
Calcium: 10 mg/dL (ref 8.9–10.3)
Chloride: 105 mmol/L (ref 98–111)
Creatinine, Ser: 0.8 mg/dL (ref 0.44–1.00)
GFR, Estimated: >60 mL/min (ref >60)
Glucose, Bld: 79 mg/dL (ref 70–99)
Potassium: 4.2 mmol/L (ref 3.5–5.1)
Sodium: 141 mmol/L (ref 135–145)

## 2022-05-26 LAB — BRAIN NATRIURETIC PEPTIDE: B Natriuretic Peptide: 36.5 pg/mL (ref 0.0–100.0)

## 2022-05-26 MED ORDER — FUROSEMIDE 20 MG PO TABS: 20.0000 mg | ORAL_TABLET | Freq: Every day | ORAL | 0 refills | Status: DC

## 2022-05-26 MED ORDER — CEPHALEXIN 500 MG PO CAPS: 500.0000 mg | ORAL_CAPSULE | Freq: Three times a day (TID) | ORAL | 0 refills | Status: DC

## 2022-05-26 NOTE — Discharge Instructions (Addendum)
Instructed patient to go to [facility] ED now for further evaluation of bilateral lower extremity edema and to rule out DVT of both lower legs with bilateral venous Doppler study.  Patient advised that RN staff has called ED charge nurse to make them aware of her pending arrival.

## 2022-05-26 NOTE — ED Triage Notes (Signed)
Patient states that she is having bilateral leg pain and swelling x 4 days.  The legs are tender to the touch.  Unsure if she was bitten by something or not.  Patient denies any OTC pain meds.

## 2022-05-26 NOTE — Discharge Instructions (Signed)
Take the water pill as prescribed as well as antibiotics.  Keep your legs elevated while you are resting at home.  Follow-up with your doctor for recheck in 2 days.  Return to the ED with increased pain, increased swelling, increased redness, fever, difficulty breathing, chest pain, any other concerns

## 2022-05-26 NOTE — ED Notes (Signed)
Doppler ultrasound in progress at bedside.

## 2022-05-26 NOTE — ED Triage Notes (Signed)
Patient arrives POV ambulatory with rolling walker states she was sent by UC for [place] to r/o bilateral leg DVT. Patient reports bilateral leg swelling onset of Monday with redness, warmth and tenderness. Patient tearful in triage- states she lost her husband on Saturday. Reports recent bronchitis and is on keflex prophylaxis for UTI.

## 2022-05-26 NOTE — ED Notes (Signed)
ED Provider at bedside. 

## 2022-05-26 NOTE — ED Provider Notes (Signed)
[facility]    CSN: [id number] Arrival date & time: 05/26/22  1357      History   Chief Complaint Chief Complaint  Patient presents with   Leg Pain    HPI Brooke Duncan is a 76 y.o. female.   HPI Very pleasant 76 year old female presents with bilateral leg pain and swelling for 4 days.  Legs are tender to touch.  Patient reports she is unsure if she has been bitten by something or not.  PMH significant for morbid obesity, history of renal cell carcinoma s/p left nephrectomy, breast cancer, and hypertension.  Past Medical History:  Diagnosis Date   Acquired solitary kidney    right side 2014   Arthritis    Complication of anesthesia    slow to wake, hard to take a deep breath   Frequency of urination    Heart murmur    per told by pcp approx. 02/ 2022 heard a very faint murmur, told did need work-up done at this time   History of Hashimoto thyroiditis    s/p  total thyroidectomy 1995   History of hypercalcemia    s/p  parathryoidectomy 07/ 2021   History of renal cell carcinoma 2014   s/p  left nephrectomy, pt stated no other treatment   Hypertension    followed by pcp   Hypothyroidism, postsurgical 1995   followed by pcp in [place], moved from [place] 01/ 2021   OSA on CPAP    Thickened endometrium    Wears glasses     Patient Active Problem List   Diagnosis Date Noted   Malignant neoplasm of upper-outer quadrant of left breast in female, estrogen receptor positive ([place]) 08/27/2021   Abnormal ultrasound of endometrium 04/08/2021    Past Surgical History:  Procedure Laterality Date   BREAST BIOPSY Left 08/24/2021   HYSTEROSCOPY WITH D & C N/A 04/08/2021   Procedure: DILATATION AND CURETTAGE /HYSTEROSCOPY, EXCISION OF VAGINAL LESION;  Surgeon: Delsa Bern, MD;  Location: [facility];  Service: Gynecology;  Laterality: N/A;   LAPAROSCOPIC CHOLECYSTECTOMY  2005   NEPHRECTOMY Left 2014   PARATHYROIDECTOMY  07/ 2021  in [place]  age 89   TOTAL HIP ARTHROPLASTY Bilateral left 2015;  right 2009   TOTAL KNEE ARTHROPLASTY Left 2014   TOTAL THYROIDECTOMY Bilateral 1995    OB History   No obstetric history on file.      Home Medications    Prior to Admission medications   Medication Sig Start Date End Date Taking? Authorizing Provider  acetaminophen (TYLENOL) 500 MG tablet Take 2 tablets (1,000 mg total) by mouth every 6 (six) hours as needed. 04/08/21  Yes Rivard, Katharine Look, MD  Ascorbic Acid (VITAMIN C PO) Take by mouth.   Yes [provider]  cephALEXin (KEFLEX) 500 MG capsule Take 1 capsule (500 mg total) by mouth daily. 03/10/22  Yes Nicholas Lose, MD  Cholecalciferol (VITAMIN D3 SUPER STRENGTH) 50 MCG (2000 UT) CAPS Take by mouth daily.   Yes [provider]  clotrimazole (LOTRIMIN) 1 % cream Apply 1 application topically 2 (two) times daily as needed.   Yes [provider]  CRANBERRY PO Take 2 capsules by mouth daily.   Yes [provider]  D-MANNOSE PO Take 4 capsules by mouth daily.   Yes [provider]  erythromycin ophthalmic ointment 1 application as needed.   Yes [provider]  hydrocortisone 2.5 % cream Apply topically 2 (two) times daily  as needed.   Yes [provider]  ibuprofen (ADVIL) 600 MG tablet Take 1 tablet (600 mg total) by mouth every 6 (six) hours as needed. 04/08/21  Yes Rivard, Katharine Look, MD  letrozole [facility]) 2.5 MG tablet Take 1 tablet (2.5 mg total) by mouth daily. 09/01/21  Yes Nicholas Lose, MD  levothyroxine (SYNTHROID) 150 MCG tablet Take 150 mcg by mouth daily before breakfast.   Yes [provider]  losartan (COZAAR) 25 MG tablet Take 25 mg by mouth daily.   Yes [provider]  simvastatin (ZOCOR) 10 MG tablet Take 10 mg by mouth at bedtime.   Yes [provider]  tamsulosin (FLOMAX) 0.4 MG CAPS capsule Take 1 capsule (0.4 mg total) by mouth daily. 03/10/22  Yes Nicholas Lose, MD   vitamin B-12 (CYANOCOBALAMIN) 1000 MCG tablet Take 1,000 mcg by mouth daily.   Yes [provider]  PRESCRIPTION MEDICATION 4 capsules daily. D-Mannose    [provider]    Family History Family History  Problem Relation Age of Onset   Melanoma Father    Prostate cancer Father     Social History Social History   Tobacco Use   Smoking status: Former    Years: 12.00    Types: Cigarettes    Quit date: 04/02/1974    Years since quitting: 48.1   Smokeless tobacco: Never  Vaping Use   Vaping Use: Never used  Substance Use Topics   Alcohol use: Not Currently   Drug use: Never     Allergies   Tape   Review of Systems Review of Systems  Musculoskeletal:        Bilateral leg pain and swelling x 4 days  All other systems reviewed and are negative.    Physical Exam Triage Vital Signs ED Triage Vitals  Enc Vitals Group     BP 05/26/22 1408 133/82     Pulse Rate 05/26/22 1408 79     Resp 05/26/22 1408 18     Temp 05/26/22 1408 98.5 F (36.9 C)     Temp Source 05/26/22 1408 Oral     SpO2 05/26/22 1408 94 %     Weight 05/26/22 1410 265 lb (120.2 kg)     Height 05/26/22 1410 5' 5.5" (1.664 m)     Head Circumference --      Peak Flow --      Pain Score 05/26/22 1409 3     Pain Loc --      Pain Edu? --      Excl. in [place]? --    No data found.  Updated Vital Signs BP 133/82 (BP Location: Right Arm)   Pulse 79   Temp 98.5 F (36.9 C) (Oral)   Resp 18   Ht 5' 5.5" (1.664 m)   Wt 265 lb (120.2 kg)   SpO2 94%   BMI 43.43 kg/m     Physical Exam Vitals and nursing note reviewed.  Constitutional:      General: She is not in acute distress.    Appearance: Normal appearance. She is obese. She is not ill-appearing.  HENT:     Head: Normocephalic and atraumatic.     Mouth/Throat:     Mouth: Mucous membranes are moist.     Pharynx: Oropharynx is clear.  Eyes:     Extraocular Movements: Extraocular movements intact.     Conjunctiva/sclera:  Conjunctivae normal.     Pupils: Pupils are equal, round, and reactive to light.  Cardiovascular:  Rate and Rhythm: Normal rate and regular rhythm.     Heart sounds: Murmur heard.     Comments: Faint HSEM (5/6), PT/DP palpable bilaterally Pulmonary:     Effort: Pulmonary effort is normal.     Breath sounds: Normal breath sounds. No wheezing, rhonchi or rales.  Chest:     Chest wall: No tenderness.  Musculoskeletal:     Cervical back: Normal range of motion and neck supple. No tenderness.     Right lower leg: Edema present.     Left lower leg: Edema present.     Comments: Bilateral lower extremity +2 pitting/nonpitting edema noted  Lymphadenopathy:     Cervical: No cervical adenopathy.  Skin:    General: Skin is warm and dry.     Comments: Bilateral lower legs ankles/feet: Erythematous, warm to touch  Neurological:     General: No focal deficit present.     Mental Status: She is alert and oriented to person, place, and time. Mental status is at baseline.      UC Treatments / Results  Labs (all labs ordered are listed, but only abnormal results are displayed) Labs Reviewed - No data to display  EKG   Radiology No results found.  Procedures Procedures (including critical care time)  Medications Ordered in UC Medications - No data to display  Initial Impression / Assessment and Plan / UC Course  I have reviewed the triage vital signs and the nursing notes.  Pertinent labs & imaging results that were available during my care of the patient were reviewed by me and considered in my medical decision making (see chart for details).     MDM: 1.  Bilateral lower extremity edema-Instructed patient to go to [facility] ED now for further evaluation of bilateral lower extremity edema and to rule out DVT of both lower legs with bilateral venous Doppler study.  Patient advised that RN staff has called ED charge nurse to make them aware of her pending arrival.  Patient  discharged home, hemodynamically stable. Final Clinical Impressions(s) / UC Diagnoses   Final diagnoses:  Bilateral lower extremity edema     Discharge Instructions      Instructed patient to go to [facility] ED now for further evaluation of bilateral lower extremity edema and to rule out DVT of both lower legs with bilateral venous Doppler study.  Patient advised that RN staff has called ED charge nurse to make them aware of her pending arrival.     ED Prescriptions   None    PDMP not reviewed this encounter.   Eliezer Lofts, [place] 05/26/22 [id number]

## 2022-06-10 ENCOUNTER — Other Ambulatory Visit: Payer: Medicare Other

## 2022-07-05 ENCOUNTER — Ambulatory Visit
Admission: RE | Admit: 2022-07-05 | Discharge: 2022-07-05 | Disposition: A | Discharge status: Home / Self Care | Payer: Medicare Other | Source: Ambulatory Visit | Attending: Hematology and Oncology | Admitting: Hematology and Oncology

## 2022-07-05 DIAGNOSIS — C50412 Malignant neoplasm of upper-outer quadrant of left female breast: Secondary | ICD-10-CM

## 2022-07-07 ENCOUNTER — Encounter (HOSPITAL_BASED_OUTPATIENT_CLINIC_OR_DEPARTMENT_OTHER): Payer: Self-pay | Admitting: General Surgery

## 2022-07-07 ENCOUNTER — Other Ambulatory Visit: Payer: Self-pay

## 2022-07-11 ENCOUNTER — Encounter (HOSPITAL_BASED_OUTPATIENT_CLINIC_OR_DEPARTMENT_OTHER)
Admission: RE | Admit: 2022-07-11 | Discharge: 2022-07-11 | Disposition: A | Discharge status: Home / Self Care | Payer: Medicare Other | Source: Ambulatory Visit | Attending: General Surgery | Admitting: General Surgery

## 2022-07-11 DIAGNOSIS — Z01812 Encounter for preprocedural laboratory examination: Secondary | ICD-10-CM | POA: Diagnosis present

## 2022-07-11 LAB — BASIC METABOLIC PANEL
Anion gap: 6 (ref 5–15)
BUN: 17 mg/dL (ref 8–23)
CO2: 29 mmol/L (ref 22–32)
Calcium: 9.6 mg/dL (ref 8.9–10.3)
Chloride: 105 mmol/L (ref 98–111)
Creatinine, Ser: 0.8 mg/dL (ref 0.44–1.00)
GFR, Estimated: >60 mL/min (ref >60)
Glucose, Bld: 87 mg/dL (ref 70–99)
Potassium: 4.3 mmol/L (ref 3.5–5.1)
Sodium: 140 mmol/L (ref 135–145)

## 2022-07-13 ENCOUNTER — Ambulatory Visit
Admission: RE | Admit: 2022-07-13 | Discharge: 2022-07-13 | Disposition: A | Discharge status: Home / Self Care | Payer: Medicare Other | Source: Ambulatory Visit | Attending: General Surgery | Admitting: General Surgery

## 2022-07-13 ENCOUNTER — Other Ambulatory Visit: Payer: Self-pay | Admitting: General Surgery

## 2022-07-13 DIAGNOSIS — C50412 Malignant neoplasm of upper-outer quadrant of left female breast: Secondary | ICD-10-CM

## 2022-07-13 NOTE — Anesthesia Preprocedure Evaluation (Signed)
Anesthesia Evaluation  Patient identified by MRN, date of birth, ID band Patient awake    Reviewed: Allergy & Precautions, NPO status , Patient's Chart, lab work & pertinent test results  History of Anesthesia Complications Negative for: history of anesthetic complications  Airway Mallampati: III  TM Distance: >3 FB Neck ROM: Full    Dental no notable dental hx. (+) Dental Advisory Given   Pulmonary sleep apnea and Continuous Positive Airway Pressure Ventilation , former smoker,    Pulmonary exam normal        Cardiovascular hypertension, Pt. on medications Normal cardiovascular exam     Neuro/Psych negative neurological ROS  negative psych ROS   GI/Hepatic negative GI ROS, Neg liver ROS,   Endo/Other  Hypothyroidism Morbid obesity  Renal/GU negative Renal ROS  negative genitourinary   Musculoskeletal  (+) Arthritis ,   Abdominal   Peds  Hematology negative hematology ROS (+)   Anesthesia Other Findings Day of surgery medications reviewed with patient.  Reproductive/Obstetrics Thickened endometrium                            Anesthesia Physical  Anesthesia Plan  ASA: III  Anesthesia Plan: General   Post-op Pain Management: Celebrex PO (pre-op)* and Tylenol PO (pre-op)*   Induction: Intravenous  PONV Risk Score and Plan: 4 or greater and Treatment may vary due to age or medical condition, Ondansetron, Dexamethasone and Diphenhydramine  Airway Management Planned: LMA  Additional Equipment: None  Intra-op Plan:   Post-operative Plan: Extubation in OR  Informed Consent: I have reviewed the patients History and Physical, chart, labs and discussed the procedure including the risks, benefits and alternatives for the proposed anesthesia with the patient or authorized representative who has indicated his/her understanding and acceptance.     Dental advisory given  Plan Discussed  with: Anesthesiologist and CRNA  Anesthesia Plan Comments:        Anesthesia Quick Evaluation

## 2022-07-14 ENCOUNTER — Encounter (HOSPITAL_BASED_OUTPATIENT_CLINIC_OR_DEPARTMENT_OTHER)
Admission: RE | Disposition: A | Discharge status: Home / Self Care | Payer: Self-pay | Source: Home / Self Care | Attending: General Surgery

## 2022-07-14 ENCOUNTER — Other Ambulatory Visit: Payer: Self-pay

## 2022-07-14 ENCOUNTER — Ambulatory Visit
Admission: RE | Admit: 2022-07-14 | Discharge: 2022-07-14 | Disposition: A | Discharge status: Home / Self Care | Payer: Medicare Other | Source: Ambulatory Visit | Attending: General Surgery | Admitting: General Surgery

## 2022-07-14 ENCOUNTER — Ambulatory Visit (HOSPITAL_BASED_OUTPATIENT_CLINIC_OR_DEPARTMENT_OTHER): Payer: Medicare Other | Admitting: Anesthesiology

## 2022-07-14 ENCOUNTER — Encounter (HOSPITAL_BASED_OUTPATIENT_CLINIC_OR_DEPARTMENT_OTHER): Payer: Self-pay | Admitting: General Surgery

## 2022-07-14 ENCOUNTER — Ambulatory Visit (HOSPITAL_BASED_OUTPATIENT_CLINIC_OR_DEPARTMENT_OTHER)
Admission: RE | Admit: 2022-07-14 | Discharge: 2022-07-14 | Disposition: A | Discharge status: Home / Self Care | Payer: Medicare Other | Attending: General Surgery | Admitting: General Surgery

## 2022-07-14 DIAGNOSIS — E039 Hypothyroidism, unspecified: Secondary | ICD-10-CM | POA: Diagnosis not present

## 2022-07-14 DIAGNOSIS — I10 Essential (primary) hypertension: Secondary | ICD-10-CM | POA: Diagnosis not present

## 2022-07-14 DIAGNOSIS — Z170 Estrogen receptor positive status [ER+]: Secondary | ICD-10-CM

## 2022-07-14 DIAGNOSIS — Z87891 Personal history of nicotine dependence: Secondary | ICD-10-CM | POA: Insufficient documentation

## 2022-07-14 DIAGNOSIS — C50912 Malignant neoplasm of unspecified site of left female breast: Secondary | ICD-10-CM

## 2022-07-14 DIAGNOSIS — C50412 Malignant neoplasm of upper-outer quadrant of left female breast: Secondary | ICD-10-CM

## 2022-07-14 DIAGNOSIS — N189 Chronic kidney disease, unspecified: Secondary | ICD-10-CM | POA: Diagnosis not present

## 2022-07-14 DIAGNOSIS — I129 Hypertensive chronic kidney disease with stage 1 through stage 4 chronic kidney disease, or unspecified chronic kidney disease: Secondary | ICD-10-CM | POA: Insufficient documentation

## 2022-07-14 DIAGNOSIS — Z79899 Other long term (current) drug therapy: Secondary | ICD-10-CM

## 2022-07-14 DIAGNOSIS — Z01818 Encounter for other preprocedural examination: Secondary | ICD-10-CM

## 2022-07-14 DIAGNOSIS — Z79811 Long term (current) use of aromatase inhibitors: Secondary | ICD-10-CM | POA: Insufficient documentation

## 2022-07-14 DIAGNOSIS — N6022 Fibroadenosis of left breast: Secondary | ICD-10-CM | POA: Diagnosis not present

## 2022-07-14 DIAGNOSIS — G4730 Sleep apnea, unspecified: Secondary | ICD-10-CM | POA: Diagnosis not present

## 2022-07-14 DIAGNOSIS — Z6841 Body Mass Index (BMI) 40.0 and over, adult: Secondary | ICD-10-CM | POA: Diagnosis not present

## 2022-07-14 HISTORY — PX: BREAST LUMPECTOMY WITH RADIOACTIVE SEED LOCALIZATION: SHX6424

## 2022-07-14 SURGERY — BREAST LUMPECTOMY WITH RADIOACTIVE SEED LOCALIZATION
Anesthesia: General | Site: Breast | Laterality: Left

## 2022-07-14 MED ORDER — CHLORHEXIDINE GLUCONATE CLOTH 2 % EX PADS: 6.0000 | MEDICATED_PAD | Freq: Once | CUTANEOUS | Status: DC

## 2022-07-14 MED ORDER — ENSURE PRE-SURGERY PO LIQD: 296.0000 mL | Freq: Once | ORAL | Status: DC

## 2022-07-14 MED ORDER — PROPOFOL 500 MG/50ML IV EMUL
INTRAVENOUS | Status: DC | PRN
  Administered 2022-07-14: 150 ug/kg/min via INTRAVENOUS

## 2022-07-14 MED ORDER — CELECOXIB 200 MG PO CAPS
ORAL_CAPSULE | ORAL | Status: AC
  Filled 2022-07-14: qty 1

## 2022-07-14 MED ORDER — PHENYLEPHRINE HCL (PRESSORS) 10 MG/ML IV SOLN
INTRAVENOUS | Status: DC | PRN
  Administered 2022-07-14 (×2): 80 ug via INTRAVENOUS

## 2022-07-14 MED ORDER — FENTANYL CITRATE (PF) 100 MCG/2ML IJ SOLN
INTRAMUSCULAR | Status: AC
  Filled 2022-07-14: qty 2

## 2022-07-14 MED ORDER — CEFAZOLIN SODIUM-DEXTROSE 2-4 GM/100ML-% IV SOLN
2.0000 g | INTRAVENOUS | Status: AC
  Administered 2022-07-14: 2 g via INTRAVENOUS

## 2022-07-14 MED ORDER — FENTANYL CITRATE (PF) 100 MCG/2ML IJ SOLN
25.0000 ug | INTRAMUSCULAR | Status: DC | PRN
  Administered 2022-07-14 (×2): 25 ug via INTRAVENOUS

## 2022-07-14 MED ORDER — ONDANSETRON HCL 4 MG/2ML IJ SOLN
INTRAMUSCULAR | Status: DC | PRN
  Administered 2022-07-14: 4 mg via INTRAVENOUS

## 2022-07-14 MED ORDER — AMISULPRIDE (ANTIEMETIC) 5 MG/2ML IV SOLN: 10.0000 mg | Freq: Once | INTRAVENOUS | Status: DC | PRN

## 2022-07-14 MED ORDER — ACETAMINOPHEN 500 MG PO TABS
1000.0000 mg | ORAL_TABLET | ORAL | Status: AC
  Administered 2022-07-14: 1000 mg via ORAL

## 2022-07-14 MED ORDER — BUPIVACAINE HCL (PF) 0.25 % IJ SOLN
INTRAMUSCULAR | Status: DC | PRN
  Administered 2022-07-14: 10 mL

## 2022-07-14 MED ORDER — OXYCODONE HCL 5 MG PO TABS
5.0000 mg | ORAL_TABLET | Freq: Once | ORAL | Status: AC
  Administered 2022-07-14: 5 mg via ORAL

## 2022-07-14 MED ORDER — ACETAMINOPHEN 500 MG PO TABS: 1000.0000 mg | ORAL_TABLET | Freq: Once | ORAL | Status: DC

## 2022-07-14 MED ORDER — CEFAZOLIN SODIUM-DEXTROSE 2-4 GM/100ML-% IV SOLN
INTRAVENOUS | Status: AC
  Filled 2022-07-14: qty 100

## 2022-07-14 MED ORDER — 0.9 % SODIUM CHLORIDE (POUR BTL) OPTIME
TOPICAL | Status: DC | PRN
  Administered 2022-07-14: 1000 mL

## 2022-07-14 MED ORDER — ACETAMINOPHEN 500 MG PO TABS
ORAL_TABLET | ORAL | Status: AC
  Filled 2022-07-14: qty 2

## 2022-07-14 MED ORDER — FENTANYL CITRATE (PF) 100 MCG/2ML IJ SOLN
INTRAMUSCULAR | Status: DC | PRN
Start: 2022-07-14 — End: 2022-07-14
  Administered 2022-07-14 (×2): 50 ug via INTRAVENOUS

## 2022-07-14 MED ORDER — LACTATED RINGERS IV SOLN: INTRAVENOUS | Status: DC

## 2022-07-14 MED ORDER — CELECOXIB 200 MG PO CAPS
200.0000 mg | ORAL_CAPSULE | Freq: Once | ORAL | Status: AC
  Administered 2022-07-14: 200 mg via ORAL

## 2022-07-14 MED ORDER — PROPOFOL 10 MG/ML IV BOLUS
INTRAVENOUS | Status: DC | PRN
  Administered 2022-07-14: 150 mg via INTRAVENOUS

## 2022-07-14 MED ORDER — BUPIVACAINE HCL (PF) 0.25 % IJ SOLN
INTRAMUSCULAR | Status: AC
  Filled 2022-07-14: qty 30

## 2022-07-14 MED ORDER — PROMETHAZINE HCL 25 MG/ML IJ SOLN: 6.2500 mg | INTRAMUSCULAR | Status: DC | PRN

## 2022-07-14 MED ORDER — DEXAMETHASONE SODIUM PHOSPHATE 4 MG/ML IJ SOLN
INTRAMUSCULAR | Status: DC | PRN
  Administered 2022-07-14: 4 mg via INTRAVENOUS

## 2022-07-14 MED ORDER — OXYCODONE HCL 5 MG PO TABS
ORAL_TABLET | ORAL | Status: AC
  Filled 2022-07-14: qty 1

## 2022-07-14 MED ORDER — LIDOCAINE HCL (CARDIAC) PF 100 MG/5ML IV SOSY
PREFILLED_SYRINGE | INTRAVENOUS | Status: DC | PRN
  Administered 2022-07-14: 60 mg via INTRAVENOUS

## 2022-07-14 SURGICAL SUPPLY — 57 items
ADH SKN CLS APL DERMABOND .7 (GAUZE/BANDAGES/DRESSINGS)
APL PRP STRL LF DISP 70% ISPRP (MISCELLANEOUS) ×1
APPLIER CLIP 9.375 MED OPEN (MISCELLANEOUS)
APR CLP MED 9.3 20 MLT OPN (MISCELLANEOUS)
BINDER BREAST LRG (GAUZE/BANDAGES/DRESSINGS) IMPLANT
BINDER BREAST MEDIUM (GAUZE/BANDAGES/DRESSINGS) IMPLANT
BINDER BREAST XLRG (GAUZE/BANDAGES/DRESSINGS) IMPLANT
BINDER BREAST XXLRG (GAUZE/BANDAGES/DRESSINGS) ×1 IMPLANT
BLADE SURG 15 STRL LF DISP TIS (BLADE) ×1 IMPLANT
BLADE SURG 15 STRL SS (BLADE) ×2
CANISTER SUC SOCK COL 7IN (MISCELLANEOUS) ×1 IMPLANT
CANISTER SUCT 1200ML W/VALVE (MISCELLANEOUS) ×1 IMPLANT
CHLORAPREP W/TINT 26 (MISCELLANEOUS) ×2 IMPLANT
CLIP APPLIE 9.375 MED OPEN (MISCELLANEOUS) IMPLANT
CLIP TI WIDE RED SMALL 6 (CLIP) ×1 IMPLANT
COVER BACK TABLE 60X90IN (DRAPES) ×2 IMPLANT
COVER MAYO STAND STRL (DRAPES) ×2 IMPLANT
COVER PROBE W GEL 5X96 (DRAPES) ×2 IMPLANT
DERMABOND ADVANCED (GAUZE/BANDAGES/DRESSINGS)
DERMABOND ADVANCED .7 DNX12 (GAUZE/BANDAGES/DRESSINGS) ×1 IMPLANT
DRAPE LAPAROSCOPIC ABDOMINAL (DRAPES) ×2 IMPLANT
DRAPE UTILITY XL STRL (DRAPES) ×2 IMPLANT
DRSG TEGADERM 4X4.75 (GAUZE/BANDAGES/DRESSINGS) ×1 IMPLANT
ELECT COATED BLADE 2.86 ST (ELECTRODE) ×2 IMPLANT
ELECT REM PT RETURN 9FT ADLT (ELECTROSURGICAL) ×2
ELECTRODE REM PT RTRN 9FT ADLT (ELECTROSURGICAL) ×1 IMPLANT
GAUZE SPONGE 4X4 12PLY STRL LF (GAUZE/BANDAGES/DRESSINGS) ×1 IMPLANT
GLOVE BIO SURGEON STRL SZ7 (GLOVE) ×3 IMPLANT
GLOVE BIOGEL PI IND STRL 7.5 (GLOVE) ×1 IMPLANT
GLOVE BIOGEL PI INDICATOR 7.5 (GLOVE) ×1
GOWN STRL REUS W/ TWL LRG LVL3 (GOWN DISPOSABLE) ×2 IMPLANT
GOWN STRL REUS W/TWL LRG LVL3 (GOWN DISPOSABLE) ×4
HEMOSTAT ARISTA ABSORB 3G PWDR (HEMOSTASIS) ×1 IMPLANT
KIT MARKER MARGIN INK (KITS) ×2 IMPLANT
NDL HYPO 25X1 1.5 SAFETY (NEEDLE) ×1 IMPLANT
NEEDLE HYPO 25X1 1.5 SAFETY (NEEDLE) ×2 IMPLANT
NS IRRIG 1000ML POUR BTL (IV SOLUTION) ×1 IMPLANT
PACK BASIN DAY SURGERY FS (CUSTOM PROCEDURE TRAY) ×2 IMPLANT
PENCIL SMOKE EVACUATOR (MISCELLANEOUS) ×2 IMPLANT
RETRACTOR ONETRAX LX 90X20 (MISCELLANEOUS) IMPLANT
SLEEVE SCD COMPRESS KNEE MED (STOCKING) ×2 IMPLANT
SPIKE FLUID TRANSFER (MISCELLANEOUS) IMPLANT
SPONGE T-LAP 4X18 ~~LOC~~+RFID (SPONGE) ×3 IMPLANT
STRIP CLOSURE SKIN 1/2X4 (GAUZE/BANDAGES/DRESSINGS) ×2 IMPLANT
SUT MNCRL AB 4-0 PS2 18 (SUTURE) ×2 IMPLANT
SUT MON AB 5-0 PS2 18 (SUTURE) IMPLANT
SUT SILK 2 0 SH (SUTURE) ×1 IMPLANT
SUT VIC AB 2-0 SH 27 (SUTURE) ×6
SUT VIC AB 2-0 SH 27XBRD (SUTURE) ×1 IMPLANT
SUT VIC AB 3-0 SH 27 (SUTURE) ×2
SUT VIC AB 3-0 SH 27X BRD (SUTURE) ×1 IMPLANT
SUT VIC AB 5-0 PS2 18 (SUTURE) IMPLANT
SYR CONTROL 10ML LL (SYRINGE) ×2 IMPLANT
TOWEL GREEN STERILE FF (TOWEL DISPOSABLE) ×2 IMPLANT
TRAY FAXITRON CT DISP (TRAY / TRAY PROCEDURE) ×2 IMPLANT
TUBE CONNECTING 20X1/4 (TUBING) ×1 IMPLANT
YANKAUER SUCT BULB TIP NO VENT (SUCTIONS) ×1 IMPLANT

## 2022-07-14 NOTE — Interval H&P Note (Signed)
History and Physical Interval Note:  07/14/2022 7:15 AM  Brooke Duncan  has presented today for surgery, with the diagnosis of LEFT BREAST CANCER.  The various methods of treatment have been discussed with the patient and family. After consideration of risks, benefits and other options for treatment, the patient has consented to  Procedure(s): LEFT BREAST LUMPECTOMY WITH RADIOACTIVE SEED LOCALIZATION (Left) as a surgical intervention.  The patient's history has been reviewed, patient examined, no change in status, stable for surgery.  I have reviewed the patient's chart and labs.  Questions were answered to the patient's satisfaction.     Rolm Bookbinder

## 2022-07-14 NOTE — Anesthesia Postprocedure Evaluation (Signed)
Anesthesia Post Note  Patient: Brooke Duncan  Procedure(s) Performed: LEFT BREAST LUMPECTOMY WITH RADIOACTIVE SEED LOCALIZATION (Left: Breast)     Patient location during evaluation: PACU Anesthesia Type: General Level of consciousness: sedated Pain management: pain level controlled Vital Signs Assessment: post-procedure vital signs reviewed and stable Respiratory status: spontaneous breathing and respiratory function stable Cardiovascular status: stable Postop Assessment: no apparent nausea or vomiting Anesthetic complications: no   No notable events documented.  Last Vitals:  Vitals:   07/14/22 0915 07/14/22 0928  BP: (!) 141/80 (!) 149/78  Pulse: 74 80  Resp: 11 16  Temp:  36.7 C  SpO2: 94% 95%    Last Pain:  Vitals:   07/14/22 0928  TempSrc:   PainSc: 4                  Verneta Hamidi DANIEL

## 2022-07-14 NOTE — Discharge Instructions (Addendum)
[place] Office Phone Number [phone]  POST OP INSTRUCTIONS Take 400 mg of ibuprofen every 8 hours or 650 mg tylenol every 6 hours for next 72 hours then as needed. Use ice several times daily also.  A prescription for pain medication may be given to you upon discharge.  Take your pain medication as prescribed, if needed.  If narcotic pain medicine is not needed, then you may take acetaminophen (Tylenol), naprosyn (Alleve) or ibuprofen (Advil) as needed. Take your usually prescribed medications unless otherwise directed If you need a refill on your pain medication, please contact your pharmacy.  They will contact our office to request authorization.  Prescriptions will not be filled after 5pm or on week-ends. You should eat very light the first 24 hours after surgery, such as soup, crackers, pudding, etc.  Resume your normal diet the day after surgery. Most patients will experience some swelling and bruising in the breast.  Ice packs and a good support bra will help.  Wear the breast binder provided or a sports bra for 72 hours day and night.  After that wear a sports bra during the day until you return to the office. Swelling and bruising can take several days to resolve.  It is common to experience some constipation if taking pain medication after surgery.  Increasing fluid intake and taking a stool softener will usually help or prevent this problem from occurring.  A mild laxative (Milk of Magnesia or Miralax) should be taken according to package directions if there are no bowel movements after 48 hours. Remove clear tape dressing and gauze in 48 hours.  After that may shower, do not remove steristrips they will come off in 2-3 weeks. ACTIVITIES:  You may resume regular daily activities (gradually increasing) beginning the next day.  Wearing a good support bra or sports bra minimizes pain and swelling.  You may have sexual intercourse when it is comfortable. You may drive when  you no longer are taking prescription pain medication, you can comfortably wear a seatbelt, and you can safely maneuver your car and apply brakes. RETURN TO WORK:  ______________________________________________________________________________________ Dennis Bast should see your doctor in the office for a follow-up appointment approximately two weeks after your surgery.  Your doctor's nurse will typically make your follow-up appointment when she calls you with your pathology report.  Expect your pathology report 3-4 business days after your surgery.  You may call to check if you do not hear from [place] after three days. OTHER INSTRUCTIONS: _______________________________________________________________________________________________ _____________________________________________________________________________________________________________________________________ _____________________________________________________________________________________________________________________________________ _____________________________________________________________________________________________________________________________________  WHEN TO CALL DR WAKEFIELD: Fever over 101.0 Nausea and/or vomiting. Extreme swelling or bruising. Continued bleeding from incision. Increased pain, redness, or drainage from the incision.  The clinic staff is available to answer your questions during regular business hours.  Please don't hesitate to call and ask to speak to one of the nurses for clinical concerns.  If you have a medical emergency, go to the nearest emergency room or call 911.  A surgeon from [facility] is always on call at the hospital.  For further questions, please visit [email or web address]    May take Tylenol after 1pm, if needed. May take NSAIDS (ibuprofen, motrin) afte 1pm, if needed.  Post Anesthesia Home Care Instructions  Activity: Get plenty of rest for the remainder of the day.  A responsible individual must stay with you for 24 hours following the procedure.  For the next 24 hours, DO NOT: -Drive a car -Paediatric nurse -Drink alcoholic beverages -Take any  medication unless instructed by your physician -Make any legal decisions or sign important papers.  Meals: Start with liquid foods such as gelatin or soup. Progress to regular foods as tolerated. Avoid greasy, spicy, heavy foods. If nausea and/or vomiting occur, drink only clear liquids until the nausea and/or vomiting subsides. Call your physician if vomiting continues.  Special Instructions/Symptoms: Your throat may feel dry or sore from the anesthesia or the breathing tube placed in your throat during surgery. If this causes discomfort, gargle with warm salt water. The discomfort should disappear within 24 hours.  If you had a scopolamine patch placed behind your ear for the management of post- operative nausea and/or vomiting:  1. The medication in the patch is effective for 72 hours, after which it should be removed.  Wrap patch in a tissue and discard in the trash. Wash hands thoroughly with soap and water. 2. You may remove the patch earlier than 72 hours if you experience unpleasant side effects which may include dry mouth, dizziness or visual disturbances. 3. Avoid touching the patch. Wash your hands with soap and water after contact with the patch.

## 2022-07-14 NOTE — Op Note (Signed)
Preoperative diagnosis: left breast cancer, clinical stage I Postoperative diagnosis: Same as above Procedure  Left breast seed guided lumpectomy Surgeon: Dr. Serita Grammes Anesthesia: General  Estimated blood loss: minimal Specimens: Left breast seed guided lumpectomy containing seed and clip marked with paint Additional medial and posterior margins marked short superior, long lateral and double deep Complications: None Drains: None Special count was correct completion Disposition to recovery in stable condition  Indications: 76 y.o. female who was seen in September for breast cancer. She underwent mammography as well as an ultrasound.  She was found to have a 1.6 cm mass on ultrasound.  Axillary ultrasound was negative.  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  Someone ordered an MRI on her after these results and she was found to have a triangular-shaped area of enhancement measuring 4.4 x 2.2 x 1.8 cm, a linear area of low-level non-mass enhancement measuring 5.2 x 1.8 x 0.9 cm and a smaller rounded area of non-mass enhancement measuring 2.3 x 2.4 x 1.6 cm.  she is not able to undergo MR biopsy or follow up MR due to her physical constraints. She did not want to start with surgery either and has been on letrozole.She has a repeat mm/us that show index lesion is smaller. She wants to do lumpectomy (no mr biopsies) and no sentinel node.   Procedure: After informed consent was obtained the patient was taken to the OR.  She was given antibiotics.  SCDs were in place.  She was placed under general anesthesia without complication.  She was prepped and draped in the standard sterile surgical fashion.  Surgical timeout was then performed.   I identified the seed in uoq. I made a curvilinear incision overyling the seed.  I then dissected to the seed. I removed the seed and  some of surrounding tissue with an attempt to get clear margins. I took larger margins than normal due to mr findings. . Mammogram confirmed removal of the seed and the clip.   I did remove two additional margins as above.  Hemostasis was observed. I closed the breast tissue with 2-0 vicryl. I did place a clip in the cavity.   I then closed skin with 3-0 vicryl and 5-0 monocryl. Glue was placed.   she tolerated well, was extubated and transferred to recovery stable

## 2022-07-14 NOTE — Anesthesia Procedure Notes (Signed)
Procedure Name: LMA Insertion Date/Time: 07/14/2022 7:36 AM  Performed by: [name] Rogacki, Ernesta Amble, CRNAPre-anesthesia Checklist: Patient identified, Emergency Drugs available, Suction available and Patient being monitored Patient Re-evaluated:Patient Re-evaluated prior to induction Oxygen Delivery Method: Circle system utilized Preoxygenation: Pre-oxygenation with 100% oxygen Induction Type: IV induction Ventilation: Mask ventilation without difficulty LMA: LMA inserted LMA Size: 4.0 Number of attempts: 1 Airway Equipment and Method: Bite block Placement Confirmation: positive ETCO2 Tube secured with: Tape Dental Injury: Teeth and Oropharynx as per pre-operative assessment

## 2022-07-14 NOTE — Transfer of Care (Signed)
Immediate Anesthesia Transfer of Care Note  Patient: Brooke Duncan  Procedure(s) Performed: LEFT BREAST LUMPECTOMY WITH RADIOACTIVE SEED LOCALIZATION (Left: Breast)  Patient Location: PACU  Anesthesia Type:General  Level of Consciousness: awake, alert , oriented and patient cooperative  Airway & Oxygen Therapy: Patient Spontanous Breathing and Patient connected to face mask oxygen  Post-op Assessment: Report given to RN and Post -op Vital signs reviewed and stable  Post vital signs: Reviewed and stable  Last Vitals:  Vitals Value Taken Time  BP 127/83 07/14/22 0839  Temp    Pulse 87 07/14/22 0841  Resp    SpO2 98 % 07/14/22 0841  Vitals shown include unvalidated device data.  Last Pain:  Vitals:   07/14/22 0657  TempSrc: Oral  PainSc: 0-No pain         Complications: No notable events documented.

## 2022-07-14 NOTE — H&P (Signed)
76 y.o. female who is seen today for breast cancer I saw her initially at end of September with following history.  She has a prior history of a wire-guided excisional biopsy for calcifications that was benign.  She does have an allergy to Dermabond she tells me that was noted at her parathyroidectomy. 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 and is using a walker and is quite unsteady at this point and also has a left rotator cuff issue. She has no mass or discharge.  She underwent mammography as well as an ultrasound.  She was found to have a 1.6 cm mass on ultrasound.  Axillary ultrasound was negative.  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  Someone ordered an MRI on her after these results and she was found to have a triangular-shaped area of enhancement measuring 4.4 x 2.2 x 1.8 cm, a linear area of low-level non-mass enhancement measuring 5.2 x 1.8 x 0.9 cm and a smaller rounded area of non-mass enhancement measuring 2.3 x 2.4 x 1.6 cm.  she is not able to undergo MR biopsy or follow up MR due to her physical constraints. She did want to start with surgery either and has been on letrozole. She returns today at three months on letrozole to discuss options. She has a repeat mm/us. This shows that the index breast mass is a decent amount smaller at 1x0.9x0.8 cm in size. There is also a probable 1.1 cm complicated cyst that is being further evaluated with biopsy/aspiration. Left axillary nodes are normal. the cyst is negative with further workup. She returns today. No changes. She is not able to get joint replacements at this time. Not able to do mr eval either. She has repeat mm/us that shows a 9x7x28mm mass now. Never had axillary lad.     Review of  Systems: A complete review of systems was obtained from the patient. I have reviewed this information and discussed as appropriate with the patient. See HPI as well for other ROS.  Review of Systems  Constitutional: Positive for malaise/fatigue.  Musculoskeletal: Positive for falls and joint pain.  All other systems reviewed and are negative.   Medical History: Past Medical History:  Diagnosis Date   Arthritis   Chronic kidney disease   History of cancer   Hypertension   Sleep apnea   Patient Active Problem List  Diagnosis   Malignant neoplasm of upper-outer quadrant of left breast in female, estrogen receptor positive (CMS-HCC)   Absent kidney   Essential (primary) hypertension   Hypothyroidism   Past Surgical History:  Procedure Laterality Date   hysteroscopy with D&C N/A 04/08/2021   CHOLECYSTECTOMY N/A  2005   NEPHRECTOMY Left  2014   PARATHYROIDECTOMY N/A  06/2020   THYROIDECTOMY TOTAL Bilateral  1995   No Active Allergies  Current Outpatient Medications on File Prior to Visit  Medication Sig Dispense Refill   levothyroxine (SYNTHROID) 150 MCG tablet levothyroxine 150 mcg tablet   losartan (COZAAR) 25 MG tablet losartan 25 mg tablet   simvastatin (ZOCOR) 10 MG tablet simvastatin 10 mg tablet   Family History  Problem Relation Age of Onset   Obesity Mother   Skin cancer Father   Obesity Father  High blood pressure (Hypertension) Father   Hyperlipidemia (Elevated cholesterol) Father   Diabetes Father   Coronary Artery Disease (Blocked arteries around heart) Father   Obesity Brother   High blood pressure (Hypertension) Brother   Hyperlipidemia (Elevated cholesterol) Brother   Coronary Artery Disease (Blocked arteries around heart) Brother   Diabetes Brother    Social History   Tobacco Use  Smoking Status Former  Smokeless Tobacco Never  Tobacco Comments  Quit smoking 50 years ago   Marital status: Unknown  Tobacco Use   Smoking status: Former    Smokeless tobacco: Never   Tobacco comments:  Quit smoking 50 years ago  Vaping Use   Vaping Use: Never used  Substance and Sexual Activity   Alcohol use: Never   Drug use: Never   Objective:   Physical Exam Constitutional:  Appearance: Normal appearance.  Chest:  Breasts: Right: No inverted nipple, mass or nipple discharge.  Left: No inverted nipple, mass or nipple discharge.  Lymphadenopathy:  Upper Body:  Right upper body: No supraclavicular or axillary adenopathy.  Left upper body: No supraclavicular or axillary adenopathy.  Neurological:  Mental Status: She is alert.    Assessment and Plan:   Malignant neoplasm of upper-outer quadrant of left breast in female, estrogen receptor positive (CMS-HCC)  Left breast radioactive seed guided lumpectomy  This is a difficult situation. She is not really able to undergo any additional MRI evaluation due to the table. We confirmed that again today. I am not 100% sure she can be given radiotherapy given the range of motion of her left arm either. I am going to ask radiation oncology to see her again. She is pretty adamant that she does not want a mastectomy. After a long conversation again today we decided we would try to do a lumpectomy at the site of the known cancer and then just continue to follow her with standard imaging afterwards. I am going to confirm that is okay with medical and radiation oncology. I discussed that mastectomy and MR biopsies first would be the standard way to go about this. Unfortunately that is not going to be possible. We discussed a lumpectomy as well as positive margins and how this would be done. She also does not want to undergo a sentinel lymph node biopsy. She is over 18 with an estrogen receptor positive tumor although this is a lobular Cancer. I think it would be reasonable given everything else is going on to avoid the sentinel lymph node biopsy. I am going to plan to schedule her in June for a  lumpectomy per her request.  We discussed there is a chance of positive margins that we would realize that she does need a mastectomy. We also discussed there is a risk that I will be leaving cancer that was found by an MRI that were not able to further evaluate. She understands all of this.

## 2022-07-15 ENCOUNTER — Encounter (HOSPITAL_BASED_OUTPATIENT_CLINIC_OR_DEPARTMENT_OTHER): Payer: Self-pay | Admitting: General Surgery

## 2022-07-19 LAB — SURGICAL PATHOLOGY

## 2022-07-21 ENCOUNTER — Encounter: Payer: Self-pay | Admitting: *Deleted

## 2022-07-21 NOTE — Progress Notes (Signed)
Patient Care Team: Lawerance Cruel, MD as PCP - General (Family Medicine) Mauro Kaufmann, RN as Oncology Nurse Navigator Rockwell Germany, RN as Oncology Nurse Navigator Rolm Bookbinder, MD as Consulting Physician (General Surgery) Nicholas Lose, MD as Consulting Physician (Hematology and Oncology) Gery Pray, MD as Consulting Physician (Radiation Oncology)  DIAGNOSIS:  Encounter Diagnosis  Name Primary?   Malignant neoplasm of upper-outer quadrant of left breast in female, estrogen receptor positive ([place])     SUMMARY OF ONCOLOGIC HISTORY: Oncology History  Malignant neoplasm of upper-outer quadrant of left breast in female, estrogen receptor positive ([place])  08/24/2021 Initial Diagnosis   MRI Breast: a triangular shaped area of enhancement measuring 4.4 x 2.2 x 1.8 cm, a linear area of low level non mass enhancement spanning 5.2 x 1.8 x 0.9 cm, and smaller rounded area of non mass enhancement spanning 2.3 x 2.4 x 1.6 cm in the left breast. 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   08/25/2021 -  Anti-estrogen oral therapy   Neoadjuvant letrozole   09/01/2021 Cancer Staging   Staging form: Breast, AJCC 8th Edition - Clinical stage from 09/01/2021: Stage IIA (cT3, cN0, cM0, G2, ER+, PR+, HER2-) - Signed by Nicholas Lose, MD on 09/01/2021 Stage prefix: Initial diagnosis Histologic grading system: 3 grade system     CHIEF COMPLIANT: Follow-up post op  INTERVAL HISTORY: Brooke Duncan is a 76 y.o is here because the above mention. She presents to the clinic today for a follow-up. She had some concerns about radiation. She states that she did have some mild hot flashes but tolerable. She is tolerating the Letrozole extremely well.    ALLERGIES:  is allergic to other, wound dressing adhesive, and tape.  MEDICATIONS:  Current Outpatient Medications  Medication Sig Dispense Refill   acetaminophen  (TYLENOL) 500 MG tablet Take 2 tablets (1,000 mg total) by mouth every 6 (six) hours as needed. 30 tablet 0   Ascorbic Acid (VITAMIN C PO) Take by mouth.     cephALEXin (KEFLEX) 500 MG capsule Take 1 capsule (500 mg total) by mouth 3 (three) times daily. 21 capsule 0   Cholecalciferol (VITAMIN D3 SUPER STRENGTH) 50 MCG (2000 UT) CAPS Take by mouth daily.     clotrimazole (LOTRIMIN) 1 % cream Apply 1 application topically 2 (two) times daily as needed.     CRANBERRY PO Take 2 capsules by mouth daily.     D-MANNOSE PO Take 4 capsules by mouth daily.     erythromycin ophthalmic ointment 1 application as needed.     hydrocortisone 2.5 % cream Apply topically 2 (two) times daily as needed.     ibuprofen (ADVIL) 600 MG tablet Take 1 tablet (600 mg total) by mouth every 6 (six) hours as needed. 30 tablet 0   letrozole (FEMARA) 2.5 MG tablet Take 1 tablet (2.5 mg total) by mouth daily. 90 tablet 3   levothyroxine (SYNTHROID) 150 MCG tablet Take 150 mcg by mouth daily before breakfast.     losartan (COZAAR) 25 MG tablet Take 25 mg by mouth daily.     simvastatin (ZOCOR) 10 MG tablet Take 10 mg by mouth at bedtime.     tamsulosin (FLOMAX) 0.4 MG CAPS capsule Take 1 capsule (0.4 mg total) by mouth daily. 30 capsule    vitamin B-12 (CYANOCOBALAMIN) 1000 MCG tablet Take 1,000 mcg by mouth daily.     No current facility-administered medications for this visit.  PHYSICAL EXAMINATION: ECOG PERFORMANCE STATUS: 1 - Symptomatic but completely ambulatory  Vitals:   07/25/22 1104  BP: (!) 148/78  Pulse: 81  Resp: 18  Temp: (!) 97.3 F (36.3 C)  SpO2: 100%   Filed Weights   07/25/22 1104  Weight: 267 lb 4.8 oz (121.2 kg)     LABORATORY DATA:  I have reviewed the data as listed    Latest Ref Rng & Units 07/11/2022    8:30 AM 05/26/2022    5:01 PM 09/01/2021   12:44 PM  CMP  Glucose 70 - 99 mg/dL 87  79  98   BUN 8 - 23 mg/dL _0 Creatinine 0.44 - 1.00 mg/dL 0.80  0.80  0.76   Sodium  135 - 145 mmol/L 140  141  140   Potassium 3.5 - 5.1 mmol/L 4.3  4.2  4.4   Chloride 98 - 111 mmol/L 105  105  105   CO2 22 - 32 mmol/L _1 Calcium 8.9 - 10.3 mg/dL 9.6  10.0  9.4   Total Protein 6.5 - 8.1 g/dL   6.9   Total Bilirubin 0.3 - 1.2 mg/dL   0.7   Alkaline Phos 38 - 126 U/L   79   AST 15 - 41 U/L   12   ALT 0 - 44 U/L   17     Lab Results  Component Value Date   WBC 8.9 05/26/2022   HGB 13.1 05/26/2022   HCT 42.5 05/26/2022   MCV 86.7 05/26/2022   PLT 141 (L) 05/26/2022   NEUTROABS 5.9 05/26/2022    ASSESSMENT & PLAN:  Malignant neoplasm of upper-outer quadrant of left breast in female, estrogen receptor positive ([place]) 08/24/2021: Screening mammogram revealed left breast asymmetry at 2 o'clock position measuring 1.6 cm.  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  MRI detected 2 additional masses measuring 5.2 cm and 2.4 cm (need biopsied) no lymphadenopathy   Recommendations:  1.  Neoadjuvant antiestrogen therapy with letrozole (until additional biopsies were performed) 2. left lumpectomy: No residual cancer identified.  All margins are negative 3. Oncotype DX: 21 (7% distant recurrence at 9 years) 4. +/- Adjuvant radiation therapy followed by 5. Adjuvant antiestrogen therapy ---------------------------------------------------------------------------------------------------------------------------------------- Pathology counseling: I discussed the final pathology report of the patient provided  a copy of this report. I discussed the margins. We also discussed the final staging along with previously performed ER/PR and HER-2/neu testing.  Patient had a pathologic complete response.  If the patient does not receive radiation she will continue with the letrozole Return to clinic in 3 months for survivorship care plan visit      No orders  of the defined types were placed in this encounter.  The patient has a good understanding of the overall plan. she agrees with it. she will call with any problems that may develop before the next visit here. Total time spent: 30 mins including face to face time and time spent for planning, charting and co-ordination of care   Harriette Ohara, MD 07/25/22    I Gardiner Coins am scribing for Dr.Gaila Engebretsen  I have reviewed the above documentation for accuracy and completeness, and I agree with the above.

## 2022-07-25 ENCOUNTER — Inpatient Hospital Stay: Payer: Medicare Other | Attending: Hematology and Oncology | Admitting: Hematology and Oncology

## 2022-07-25 ENCOUNTER — Encounter: Payer: Self-pay | Admitting: *Deleted

## 2022-07-25 ENCOUNTER — Other Ambulatory Visit: Payer: Self-pay

## 2022-07-25 DIAGNOSIS — C50412 Malignant neoplasm of upper-outer quadrant of left female breast: Secondary | ICD-10-CM | POA: Diagnosis not present

## 2022-07-25 DIAGNOSIS — Z170 Estrogen receptor positive status [ER+]: Secondary | ICD-10-CM | POA: Diagnosis not present

## 2022-07-25 DIAGNOSIS — Z79899 Other long term (current) drug therapy: Secondary | ICD-10-CM | POA: Insufficient documentation

## 2022-07-25 DIAGNOSIS — Z79811 Long term (current) use of aromatase inhibitors: Secondary | ICD-10-CM | POA: Insufficient documentation

## 2022-07-25 NOTE — Assessment & Plan Note (Addendum)
08/24/2021: Screening mammogram revealed left breast asymmetry at 2 o'clock position measuring 1.6 cm. 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 MRI detected 2 additional masses measuring 5.2 cm and 2.4 cm (need biopsied) no lymphadenopathy  Recommendations:  1.Neoadjuvant antiestrogen therapy with letrozole (until additional biopsies were performed) 2. left lumpectomy: No residual cancer identified.  All margins are negative 3. Oncotype DX: 21 (7% distant recurrence at 9 years) 4. Adjuvant radiation therapy followed by 5. Adjuvant antiestrogen therapy ---------------------------------------------------------------------------------------------------------------------------------------- Pathology counseling: I discussed the final pathology report of the patient provided  a copy of this report. I discussed the margins. We also discussed the final staging along with previously performed ER/PR and HER-2/neu testing.  Patient had a pathologic complete response.  Return to clinic after radiation to start antiestrogen therapy

## 2022-07-29 ENCOUNTER — Encounter: Payer: Self-pay | Admitting: *Deleted

## 2022-07-29 ENCOUNTER — Ambulatory Visit
Admission: EM | Admit: 2022-07-29 | Discharge: 2022-07-29 | Disposition: A | Discharge status: Home / Self Care | Payer: Medicare Other | Attending: Family Medicine | Admitting: Family Medicine

## 2022-07-29 DIAGNOSIS — S80861A Insect bite (nonvenomous), right lower leg, initial encounter: Secondary | ICD-10-CM | POA: Diagnosis not present

## 2022-07-29 DIAGNOSIS — S80862A Insect bite (nonvenomous), left lower leg, initial encounter: Secondary | ICD-10-CM | POA: Diagnosis not present

## 2022-07-29 DIAGNOSIS — W57XXXA Bitten or stung by nonvenomous insect and other nonvenomous arthropods, initial encounter: Secondary | ICD-10-CM

## 2022-07-29 MED ORDER — METHYLPREDNISOLONE 4 MG PO TBPK: ORAL_TABLET | ORAL | 0 refills | Status: DC

## 2022-07-29 MED ORDER — DOXYCYCLINE HYCLATE 100 MG PO CAPS: 100.0000 mg | ORAL_CAPSULE | Freq: Two times a day (BID) | ORAL | 0 refills | Status: AC

## 2022-07-29 NOTE — ED Triage Notes (Addendum)
Pt c/o mosquito bites turned into blisters on lower RT leg x 1 week. Worried about cellulitis. Cortizone cream prn. Takes 1 keflex daily for UTI preventative.

## 2022-07-29 NOTE — ED Provider Notes (Signed)
[name]    CSN: [id number] Arrival date & time: 07/29/22  1240      History   Chief Complaint Chief Complaint  Patient presents with   Insect Bite    And blisters    HPI Brooke Duncan is a 76 y.o. female.   HPI Pleasant 76 year old female presents with blisters to right lower leg for 1 week.  Worried about cellulitis.  Patient reports taking Keflex daily for UTI.  PMH significant for obesity, HTN, hypothyroidism, and OSA.  Past Medical History:  Diagnosis Date   Acquired solitary kidney    right side 2014   Arthritis    Complication of anesthesia    slow to wake, hard to take a deep breath   Frequency of urination    Heart murmur    per told by pcp approx. 02/ 2022 heard a very faint murmur, told did need work-up done at this time   History of Hashimoto thyroiditis    s/p  total thyroidectomy 1995   History of hypercalcemia    s/p  parathryoidectomy 07/ 2021   History of renal cell carcinoma 2014   s/p  left nephrectomy, pt stated no other treatment   Hypertension    followed by pcp   Hypothyroidism, postsurgical 1995   followed by pcp in [place], moved from [place] 01/ 2021   OSA on CPAP    Thickened endometrium    Wears glasses     Patient Active Problem List   Diagnosis Date Noted   Malignant neoplasm of upper-outer quadrant of left breast in female, estrogen receptor positive ([place]) 08/27/2021   Abnormal ultrasound of endometrium 04/08/2021    Past Surgical History:  Procedure Laterality Date   BREAST BIOPSY Left 08/24/2021   BREAST LUMPECTOMY WITH RADIOACTIVE SEED LOCALIZATION Left 07/14/2022   Procedure: LEFT BREAST LUMPECTOMY WITH RADIOACTIVE SEED LOCALIZATION;  Surgeon: Rolm Bookbinder, MD;  Location: [facility];  Service: General;  Laterality: Left;   HYSTEROSCOPY WITH D & C N/A 04/08/2021   Procedure: DILATATION AND CURETTAGE /HYSTEROSCOPY, EXCISION OF VAGINAL LESION;  Surgeon: Delsa Bern, MD;  Location:  [facility];  Service: Gynecology;  Laterality: N/A;   LAPAROSCOPIC CHOLECYSTECTOMY  2005   NEPHRECTOMY Left 2014   PARATHYROIDECTOMY  07/ 2021  in [place]  age 42   TOTAL HIP ARTHROPLASTY Bilateral left 2015;  right 2009   TOTAL KNEE ARTHROPLASTY Left 2014   TOTAL THYROIDECTOMY Bilateral 1995    OB History   No obstetric history on file.      Home Medications    Prior to Admission medications   Medication Sig Start Date End Date Taking? Authorizing Provider  doxycycline (VIBRAMYCIN) 100 MG capsule Take 1 capsule (100 mg total) by mouth 2 (two) times daily for 10 days. 07/29/22 08/08/22 Yes Eliezer Lofts, FNP  methylPREDNISolone (MEDROL DOSEPAK) 4 MG TBPK tablet Take as directed 07/29/22  Yes Eliezer Lofts, FNP  acetaminophen (TYLENOL) 500 MG tablet Take 2 tablets (1,000 mg total) by mouth every 6 (six) hours as needed. 04/08/21   Delsa Bern, MD  Ascorbic Acid (VITAMIN C PO) Take by mouth.    [provider]  cephALEXin (KEFLEX) 500 MG capsule Take 1 capsule (500 mg total) by mouth 3 (three) times daily. 05/26/22   Rancour, Annie Main, MD  Cholecalciferol (VITAMIN D3 SUPER STRENGTH) 50 MCG (2000 UT) CAPS Take by mouth daily.    [provider]  clotrimazole (LOTRIMIN) 1 % cream Apply  1 application topically 2 (two) times daily as needed.    [provider]  CRANBERRY PO Take 2 capsules by mouth daily.    [provider]  D-MANNOSE PO Take 4 capsules by mouth daily.    [provider]  erythromycin ophthalmic ointment 1 application as needed.    [provider]  hydrocortisone 2.5 % cream Apply topically 2 (two) times daily as needed.    [provider]  ibuprofen (ADVIL) 600 MG tablet Take 1 tablet (600 mg total) by mouth every 6 (six) hours as needed. 04/08/21   Delsa Bern, MD  letrozole [facility]) 2.5 MG tablet Take 1 tablet (2.5 mg total) by mouth daily. 09/01/21   Nicholas Lose, MD   levothyroxine (SYNTHROID) 150 MCG tablet Take 150 mcg by mouth daily before breakfast.    [provider]  losartan (COZAAR) 25 MG tablet Take 25 mg by mouth daily.    [provider]  simvastatin (ZOCOR) 10 MG tablet Take 10 mg by mouth at bedtime.    [provider]  tamsulosin (FLOMAX) 0.4 MG CAPS capsule Take 1 capsule (0.4 mg total) by mouth daily. 03/10/22   Nicholas Lose, MD  vitamin B-12 (CYANOCOBALAMIN) 1000 MCG tablet Take 1,000 mcg by mouth daily.    [provider]    Family History Family History  Problem Relation Age of Onset   Melanoma Father    Prostate cancer Father     Social History Social History   Tobacco Use   Smoking status: Former    Years: 12.00    Types: Cigarettes    Quit date: 04/02/1974    Years since quitting: 48.3   Smokeless tobacco: Never  Vaping Use   Vaping Use: Never used  Substance Use Topics   Alcohol use: Not Currently   Drug use: Never     Allergies   Other, Wound dressing adhesive, and Tape   Review of Systems Review of Systems  Skin:  Positive for rash.     Physical Exam Triage Vital Signs ED Triage Vitals  Enc Vitals Group     BP 07/29/22 1329 133/77     Pulse Rate 07/29/22 1329 80     Resp 07/29/22 1329 18     Temp 07/29/22 1329 98.7 F (37.1 C)     Temp Source 07/29/22 1329 Oral     SpO2 07/29/22 1329 97 %     Weight --      Height --      Head Circumference --      Peak Flow --      Pain Score 07/29/22 1330 0     Pain Loc --      Pain Edu? --      Excl. in [place]? --    No data found.  Updated Vital Signs BP 133/77 (BP Location: Right Arm)   Pulse 80   Temp 98.7 F (37.1 C) (Oral)   Resp 18   SpO2 97%    Physical Exam Vitals and nursing note reviewed.  Constitutional:      General: She is not in acute distress.    Appearance: She is obese. She is not ill-appearing.  HENT:     Head: Normocephalic and atraumatic.     Mouth/Throat:     Mouth: Mucous membranes are  moist.     Pharynx: Oropharynx is clear.  Eyes:     Extraocular Movements: Extraocular movements intact.     Conjunctiva/sclera: Conjunctivae normal.  Pupils: Pupils are equal, round, and reactive to light.  Cardiovascular:     Rate and Rhythm: Normal rate and regular rhythm.     Pulses: Normal pulses.     Heart sounds: Normal heart sounds.  Pulmonary:     Effort: Pulmonary effort is normal.     Breath sounds: Normal breath sounds. No wheezing, rhonchi or rales.  Musculoskeletal:        General: Normal range of motion.     Cervical back: Normal range of motion and neck supple.  Skin:    General: Skin is warm and dry.     Comments: Bilateral lower legs (anterior aspects): Several clear roofed bullae and patches of erythema noted-please see images below  Neurological:     General: No focal deficit present.     Mental Status: She is alert and oriented to person, place, and time.         UC Treatments / Results  Labs (all labs ordered are listed, but only abnormal results are displayed) Labs Reviewed - No data to display  EKG   Radiology No results found.  Procedures Procedures (including critical care time)  Medications Ordered in UC Medications - No data to display  Initial Impression / Assessment and Plan / UC Course  I have reviewed the triage vital signs and the nursing notes.  Pertinent labs & imaging results that were available during my care of the patient were reviewed by me and considered in my medical decision making (see chart for details).     MDM: 1.  Bug bites with infection, initial encounter-Rx doxycycline, Medrol Dosepak. Patient take medication as directed with food to completion.  Advised patient to take Medrol Dosepak with first dose of doxycycline for the next 5 of 10 days.  Encouraged patient to increase daily water intake while taking these medications.  Advised patient if symptoms worsen and/or unresolved please follow-up with PCP or here  for further evaluation.  Patient discharged home, hemodynamically stable. Final Clinical Impressions(s) / UC Diagnoses   Final diagnoses:  Bug bite with infection, initial encounter     Discharge Instructions      Patient take medication as directed with food to completion.  Advised patient to take Medrol Dosepak with first dose of doxycycline for the next 5 of 10 days.  Encouraged patient to increase daily water intake while taking these medications.  Advised patient if symptoms worsen and/or unresolved please follow-up with PCP or here for further evaluation.     ED Prescriptions     Medication Sig Dispense Auth. Provider   doxycycline (VIBRAMYCIN) 100 MG capsule Take 1 capsule (100 mg total) by mouth 2 (two) times daily for 10 days. 20 capsule Eliezer Lofts, FNP   methylPREDNISolone (MEDROL DOSEPAK) 4 MG TBPK tablet Take as directed 1 each Eliezer Lofts, FNP      PDMP not reviewed this encounter.   Eliezer Lofts, [place] 07/29/22 1502

## 2022-07-29 NOTE — Discharge Instructions (Addendum)
Patient take medication as directed with food to completion.  Advised patient to take Medrol Dosepak with first dose of doxycycline for the next 5 of 10 days.  Encouraged patient to increase daily water intake while taking these medications.  Advised patient if symptoms worsen and/or unresolved please follow-up with PCP or here for further evaluation.

## 2022-07-30 ENCOUNTER — Telehealth: Payer: Self-pay

## 2022-07-30 NOTE — Telephone Encounter (Signed)
Called pt to check on status since UC visit. States leg is the same. Shes gotten two doses of abx in. Advised if no improvement in next couple days to follow up with PCP or here for further evaluation.

## 2022-08-01 NOTE — Progress Notes (Signed)
Location of Breast Cancer:upper-outer quadrant of left breast    Histology per Pathology Report:   07/14/2022 FINAL MICROSCOPIC DIAGNOSIS:   A. BREAST, LEFT, LUMPECTOMY:  - Fibrosis and mild associated chronic inflammation.  - No residual carcinoma identified (ypT0).  - All surgical margins negative for tumor.  - Fibrocystic changes with usual ductal hyperplasia.  - Previous biopsy clip.  - See oncology table.   B. BREAST, LEFT ADDITIONAL POSTERIOR MARGIN, EXCISION:  - Fibrocystic changes with atypical ductal hyperplasia.  - Posterior margin negative for tumor.  -See comment.     C. BREAST, LEFT ADDITIONAL MEDIAL MARGIN, EXCISION:  - Fibrocystic changes with focal adenosis.  - Medial margin negative for tumor.   08/24/2021 Diagnosis Breast, left, needle core biopsy, 2 o'clock, 8cmfn - INVASIVE MAMMARY CARCINOMA, 1.0 CM IN MAXIMUM EXTENT, INVOLVING THREE OF THREE SPECIMENS. - MAMMARY CARCINOMA IN SITU.  Receptor Status: ER(70%), PR (10%), Her2-neu (negative), Ki-(15%)  Did patient present with symptoms (if so, please note symptoms) or was this found on screening mammography?: screening mammogram  Past/Anticipated interventions by surgeon, if QZR:AQTM breast seed guided lumpectomy 07/14/2022 by Dr. Donne Hazel  Past/Anticipated interventions by medical oncology, if any: Neoadjuvant antiestrogen therapy with letrozole followed by adjuvant antiestrogen therapy after radiation.  Lymphedema issues, if any:  no    Pain issues, if any:  yes  - has arthritis especially in her left shoulder.  SAFETY ISSUES: Prior radiation? no Pacemaker/ICD? no Possible current pregnancy?no Is the patient on methotrexate? no  Current Complaints / other details:  Has an appointment with an orthopedic doctor in September for her left shoulder.    BP 123/61 (BP Location: Right Arm, Patient Position: Sitting, Cuff Size: Large)   Pulse 80   Temp 97.9 F (36.6 C)   Resp 20   Ht 5' 5.5" (1.664  m)   Wt 266 lb 3.2 oz (120.7 kg)   SpO2 100%   BMI 43.62 kg/m      Jacqulyn Liner, RN 08/01/2022,7:59 AM

## 2022-08-01 NOTE — Progress Notes (Signed)
Radiation Oncology         [phone] ________________________________  Name: Brooke Duncan MRN: [id number]  Date: 08/02/2022  DOB: 29-Jun-1946  Re-Evaluation Note  CC: Lawerance Cruel, MD  Nicholas Lose, MD  No diagnosis found.  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 She was seen in the multidisciplinary breast clinic/consultation on 09/01/2021.   Since consultation, the patient established care with OP rehab on 09/01/22 for her left shoulder pain. Her left shoulder pain limited her ROM and required extensive physical therapy to enable her to tolerate breast surgery. Despite therapy, the patient was not able to undergo additional MRI guided biopsies as planned due to not being able to lift her arm above the shoulder level (she is also not able to go in MRI machine given her previous experience with the MRI). The patient continued to meet with OP rehab until 02/21/22. Prior to her last visit, the patient suffered a minor fall which exacerbated her left shoulder pain. The patient also has non-associated bilateral hip problems and a history of bilateral hip replacements, along with right knee problems which she sees orthopedics for (needs right knee replacement).   The patient also began neoadjuvant letrozole under Dr. Lindi Adie on 08/25/21. She has been tolerating this fairly well without any problems or concerns other than mild hot flashes.    Oncotype DX results reported on 09/09/22 from the initial biopsy sample showed a recurrence score of 21. This predicts a risk of recurrence outside the breast over the next 9 years of 7%, if the patient's only systemic therapy is an antiestrogen for 5 years.  It also predicts no significant benefit from chemotherapy.  On 05/26/22, the patient presented to an urgent care with bilateral leg pain and swelling for 4 days. She was instructed to present to the [facility] ED for an LE venous doppler which showed no evidence of DVT in either lower extremity. Labs and CXR were also unremarkable, and the patient was given short course of lasix for peripheral edema and abx.   She opted to proceed with Left breast lumpectomy without nodal biopsies on 07/14/22 under the care of Dr. Donne Hazel. Pathology from the procedure revealed: no residual carcinoma identified s/p neoadjuvant therapy and fibrocystic changes with usual ductal hyperplasia.   Following XRT, the patient will return to Dr. Lindi Adie to initiate further antiestrogen therapy.   Pertinent imaging performed in the interval includes the following:  -- Diagnostic left breast mammogram and left breast ultrasound on 12/08/21 showed a mild decrease in size of the 2 o'clock left breast mass, and a probable 1.1 cm complicated cyst in the 4 o'clock left breast at. No abnormal left axillary lymph nodes were appreciated.  -- Diagnostic left breast mammogram and left breast ultrasound on 03/09/22 again showed a very slight decrease in size of the left breast malignancy. No new findings in the left breast were appreciated.  -- Diagnostic left breast mammogram and left breast ultrasound on 07/05/22 showed no significant  change in the left breast mass.   On review of systems, the patient reports ***. She denies *** and any other symptoms.    Allergies:  is allergic to other, wound dressing adhesive, and tape.  Meds: Current Outpatient Medications  Medication  Sig Dispense Refill   acetaminophen (TYLENOL) 500 MG tablet Take 2 tablets (1,000 mg total) by mouth every 6 (six) hours as needed. 30 tablet 0   Ascorbic Acid (VITAMIN C PO) Take by mouth.     cephALEXin (KEFLEX) 500 MG capsule Take 1 capsule (500 mg total) by mouth 3 (three) times daily. 21 capsule 0   Cholecalciferol (VITAMIN D3 SUPER STRENGTH) 50 MCG (2000 UT) CAPS Take by mouth daily.     clotrimazole (LOTRIMIN) 1 % cream Apply 1 application topically 2 (two) times daily as needed.     CRANBERRY PO Take 2 capsules by mouth daily.     D-MANNOSE PO Take 4 capsules by mouth daily.     doxycycline (VIBRAMYCIN) 100 MG capsule Take 1 capsule (100 mg total) by mouth 2 (two) times daily for 10 days. 20 capsule 0   erythromycin ophthalmic ointment 1 application as needed.     hydrocortisone 2.5 % cream Apply topically 2 (two) times daily as needed.     ibuprofen (ADVIL) 600 MG tablet Take 1 tablet (600 mg total) by mouth every 6 (six) hours as needed. 30 tablet 0   letrozole (FEMARA) 2.5 MG tablet Take 1 tablet (2.5 mg total) by mouth daily. 90 tablet 3   levothyroxine (SYNTHROID) 150 MCG tablet Take 150 mcg by mouth daily before breakfast.     losartan (COZAAR) 25 MG tablet Take 25 mg by mouth daily.     methylPREDNISolone (MEDROL DOSEPAK) 4 MG TBPK tablet Take as directed 1 each 0   simvastatin (ZOCOR) 10 MG tablet Take 10 mg by mouth at bedtime.     tamsulosin (FLOMAX) 0.4 MG CAPS capsule Take 1 capsule (0.4 mg total) by mouth daily. 30 capsule    vitamin B-12 (CYANOCOBALAMIN) 1000 MCG tablet Take 1,000 mcg by mouth daily.     No current facility-administered medications for this encounter.    Physical Findings: The patient is in no acute distress. Patient is alert and oriented.  vitals were not taken for this visit.  No significant changes. Lungs are clear to auscultation bilaterally. Heart has regular rate and rhythm. No palpable cervical, supraclavicular, or axillary adenopathy. Abdomen  soft, non-tender, normal bowel sounds. *** Breast: no palpable mass, nipple discharge or bleeding. *** Breast: ***  Lab Findings: Lab Results  Component Value Date   WBC 8.9 05/26/2022   HGB 13.1 05/26/2022   HCT 42.5 05/26/2022   MCV 86.7 05/26/2022   PLT 141 (L) 05/26/2022    Radiographic Findings: MM Breast Surgical Specimen  Result Date: 07/14/2022 CLINICAL DATA:  Evaluate specimen EXAM: SPECIMEN RADIOGRAPH OF THE LEFT BREAST COMPARISON:  Previous exam(s). FINDINGS: Status post excision of the left breast. The radioactive seed and biopsy marker clip are present, completely intact, and were marked for pathology. IMPRESSION: Specimen radiograph of the left breast. Electronically Signed   By: Dorise Bullion III M.D.   On: 07/14/2022 08:17  MM LT RADIOACTIVE SEED LOC MAMMO GUIDE  Result Date: 07/13/2022 CLINICAL DATA:  76 year old female presenting for seed localization. Patient has known left breast invasive mammary carcinoma. EXAM: MAMMOGRAPHIC GUIDED RADIOACTIVE SEED LOCALIZATION OF THE LEFT BREAST COMPARISON:  Previous exam(s). FINDINGS: Patient presents for radioactive seed localization prior to left  breast lumpectomy. I met with the patient and we discussed the procedure of seed localization including benefits and alternatives. We discussed the high likelihood of a successful procedure. We discussed the risks of the procedure including infection, bleeding, tissue injury and further surgery. We discussed the low dose of radioactivity involved in the procedure. Informed, written consent was given. The usual time-out protocol was performed immediately prior to the procedure. Using mammographic guidance, sterile technique, 1% lidocaine and an I-125 radioactive seed, the coil shaped biopsy marking clip was localized using a lateral approach. The follow-up mammogram images confirm the seed in the expected location and were marked for Dr. Donne Hazel. Follow-up survey of the patient confirms  presence of the radioactive seed. Order number of I-125 seed:  [id number]. Total activity: 0.250 mCi reference Date: June 10, 2022 The patient tolerated the procedure well and was released from the [place]. She was given instructions regarding seed removal. IMPRESSION: Radioactive seed localization left breast. No apparent complications. Electronically Signed   By: Audie Pinto M.D.   On: 07/13/2022 14:41  MM DIAG BREAST TOMO BILATERAL  Result Date: 07/05/2022 CLINICAL DATA:  Patient has known left breast cancer. Patient underwent neoadjuvant chemotherapy. Pre-surgical assessment. EXAM: DIGITAL DIAGNOSTIC BILATERAL MAMMOGRAM WITH TOMOSYNTHESIS; ULTRASOUND LEFT BREAST LIMITED TECHNIQUE: Bilateral digital diagnostic mammography and breast tomosynthesis was performed.; Targeted ultrasound examination of the left breast was performed. COMPARISON:  Previous exam(s). ACR Breast Density Category b: There are scattered areas of fibroglandular density. FINDINGS: Cc and MLO views of bilateral breasts are submitted. Biopsy clip with associated asymmetry is identified in the lateral upper left breast unchanged. No new findings are identified within the left breast. The right breast is stable. Targeted ultrasound is performed, showing 0.8 x 0.6 x 0 9 cm hypoechoic at the left breast 2 o'clock 8 cm from nipple not significantly changed compared to prior ultrasound. IMPRESSION: Known left breast cancer. RECOMMENDATION: Treatment plan. I have discussed the findings and recommendations with the patient. If applicable, a reminder letter will be sent to the patient regarding the next appointment. BI-RADS CATEGORY  6: Known biopsy-proven malignancy. Electronically Signed   By: Abelardo Diesel M.D.   On: 07/05/2022 16:06  US BREAST LTD UNI LEFT INC AXILLA  Result Date: 07/05/2022 CLINICAL DATA:  Patient has known left breast cancer. Patient underwent neoadjuvant chemotherapy. Pre-surgical assessment. EXAM: DIGITAL  DIAGNOSTIC BILATERAL MAMMOGRAM WITH TOMOSYNTHESIS; ULTRASOUND LEFT BREAST LIMITED TECHNIQUE: Bilateral digital diagnostic mammography and breast tomosynthesis was performed.; Targeted ultrasound examination of the left breast was performed. COMPARISON:  Previous exam(s). ACR Breast Density Category b: There are scattered areas of fibroglandular density. FINDINGS: Cc and MLO views of bilateral breasts are submitted. Biopsy clip with associated asymmetry is identified in the lateral upper left breast unchanged. No new findings are identified within the left breast. The right breast is stable. Targeted ultrasound is performed, showing 0.8 x 0.6 x 0 9 cm hypoechoic at the left breast 2 o'clock 8 cm from nipple not significantly changed compared to prior ultrasound. IMPRESSION: Known left breast cancer. RECOMMENDATION: Treatment plan. I have discussed the findings and recommendations with the patient. If applicable, a reminder letter will be sent to the patient regarding the next appointment. BI-RADS CATEGORY  6: Known biopsy-proven malignancy. Electronically Signed   By: Abelardo Diesel M.D.   On: 07/05/2022 16:06   Impression: S/p neoadjuvant letrozole and lumpectomy: Left Breast UOQ, No residual carcinoma  ***  Plan:  Patient is scheduled for CT simulation {date/later today}. ***  -----------------------------------  Blair Promise, PhD, MD  This document serves as a record of services personally performed by Gery Pray, MD. It was created on his behalf by Roney Mans, a trained medical scribe. The creation of this record is based on the scribe's personal observations and the provider's statements to them. This document has been checked and approved by the attending provider.

## 2022-08-02 ENCOUNTER — Ambulatory Visit: Payer: Medicare Other | Admitting: Radiation Oncology

## 2022-08-02 ENCOUNTER — Ambulatory Visit
Admission: RE | Admit: 2022-08-02 | Discharge: 2022-08-02 | Disposition: A | Discharge status: Home / Self Care | Payer: Medicare Other | Source: Ambulatory Visit | Attending: Radiation Oncology | Admitting: Radiation Oncology

## 2022-08-02 ENCOUNTER — Encounter: Payer: Self-pay | Admitting: Radiation Oncology

## 2022-08-02 ENCOUNTER — Other Ambulatory Visit: Payer: Self-pay

## 2022-08-02 VITALS — BP 123/61 | HR 80 | Temp 97.9°F | Resp 20 | Ht 65.5 in | Wt 266.2 lb

## 2022-08-02 DIAGNOSIS — Z79811 Long term (current) use of aromatase inhibitors: Secondary | ICD-10-CM | POA: Insufficient documentation

## 2022-08-02 DIAGNOSIS — Z170 Estrogen receptor positive status [ER+]: Secondary | ICD-10-CM | POA: Diagnosis not present

## 2022-08-02 DIAGNOSIS — Z7952 Long term (current) use of systemic steroids: Secondary | ICD-10-CM | POA: Diagnosis not present

## 2022-08-02 DIAGNOSIS — C50412 Malignant neoplasm of upper-outer quadrant of left female breast: Secondary | ICD-10-CM | POA: Diagnosis present

## 2022-08-02 DIAGNOSIS — Z79899 Other long term (current) drug therapy: Secondary | ICD-10-CM | POA: Insufficient documentation

## 2022-08-02 DIAGNOSIS — Z923 Personal history of irradiation: Secondary | ICD-10-CM | POA: Diagnosis not present

## 2022-08-02 DIAGNOSIS — Z79890 Hormone replacement therapy (postmenopausal): Secondary | ICD-10-CM | POA: Diagnosis not present

## 2022-08-03 ENCOUNTER — Ambulatory Visit: Payer: Medicare Other

## 2022-08-03 ENCOUNTER — Ambulatory Visit: Payer: Medicare Other | Admitting: Radiation Oncology

## 2022-08-04 HISTORY — PX: CYSTOSCOPY: SUR368

## 2022-08-08 ENCOUNTER — Other Ambulatory Visit: Payer: Self-pay | Admitting: Hematology and Oncology

## 2022-08-09 ENCOUNTER — Encounter: Payer: Self-pay | Admitting: *Deleted

## 2022-08-09 DIAGNOSIS — Z170 Estrogen receptor positive status [ER+]: Secondary | ICD-10-CM

## 2022-08-10 ENCOUNTER — Encounter (HOSPITAL_COMMUNITY): Payer: Self-pay

## 2022-08-16 ENCOUNTER — Other Ambulatory Visit: Payer: Self-pay | Admitting: Obstetrics and Gynecology

## 2022-08-16 DIAGNOSIS — M8580 Other specified disorders of bone density and structure, unspecified site: Secondary | ICD-10-CM

## 2022-08-17 ENCOUNTER — Other Ambulatory Visit: Payer: Self-pay | Admitting: Urology

## 2022-08-17 ENCOUNTER — Other Ambulatory Visit: Payer: Self-pay | Admitting: Orthopedic Surgery

## 2022-08-17 DIAGNOSIS — M25512 Pain in left shoulder: Secondary | ICD-10-CM

## 2022-08-19 ENCOUNTER — Encounter (HOSPITAL_BASED_OUTPATIENT_CLINIC_OR_DEPARTMENT_OTHER): Payer: Self-pay | Admitting: Urology

## 2022-08-19 ENCOUNTER — Other Ambulatory Visit: Payer: Self-pay

## 2022-08-19 NOTE — Progress Notes (Addendum)
Spoke w/ via phone for pre-op interview---pt Lab needs dos---- I stat              Lab results------ekg 07-11-2022 chart/epic COVID test -----patient states asymptomatic no test needed Arrive at -------1230 pm 09-02-2022 NPO after MN NO Solid Food.  Clear liquids from MN until---1130 am Med rec completed Medications to take morning of surgery -----tamsulosin, albuterol inhaler prn/bring inhaler, levothyroxine Diabetic medication -----n/a Patient instructed no nail polish to be worn day of surgery Patient instructed to bring photo id and insurance card day of surgery Patient aware to have Driver (ride ) / caregiver  tammy harwell niece cell [phone]   for 24 hours after surgery  Patient Special Instructions -----bring cpap mask tubing and machine and leave in car Pre-Op special Istructions -----none Patient verbalized understanding of instructions that were given at this phone interview. Patient denies shortness of breath, chest pain, fever, cough at this phone interview.

## 2022-08-23 ENCOUNTER — Ambulatory Visit: Payer: Medicare Other | Admitting: Rehabilitation

## 2022-08-24 ENCOUNTER — Ambulatory Visit
Admission: RE | Admit: 2022-08-24 | Discharge: 2022-08-24 | Disposition: A | Discharge status: Home / Self Care | Payer: Medicare Other | Source: Ambulatory Visit | Attending: Orthopedic Surgery | Admitting: Orthopedic Surgery

## 2022-08-24 DIAGNOSIS — M25512 Pain in left shoulder: Secondary | ICD-10-CM

## 2022-09-02 ENCOUNTER — Ambulatory Visit (HOSPITAL_COMMUNITY)
Admission: RE | Admit: 2022-09-02 | Discharge: 2022-09-02 | Disposition: A | Discharge status: Home / Self Care | Payer: Medicare Other | Attending: Urology | Admitting: Urology

## 2022-09-02 ENCOUNTER — Ambulatory Visit (HOSPITAL_BASED_OUTPATIENT_CLINIC_OR_DEPARTMENT_OTHER): Payer: Medicare Other | Admitting: Anesthesiology

## 2022-09-02 ENCOUNTER — Encounter (HOSPITAL_BASED_OUTPATIENT_CLINIC_OR_DEPARTMENT_OTHER): Payer: Self-pay | Admitting: Urology

## 2022-09-02 ENCOUNTER — Encounter (HOSPITAL_BASED_OUTPATIENT_CLINIC_OR_DEPARTMENT_OTHER)
Admission: RE | Disposition: A | Discharge status: Home / Self Care | Payer: Self-pay | Source: Home / Self Care | Attending: Urology

## 2022-09-02 ENCOUNTER — Other Ambulatory Visit: Payer: Self-pay

## 2022-09-02 DIAGNOSIS — N3289 Other specified disorders of bladder: Secondary | ICD-10-CM

## 2022-09-02 DIAGNOSIS — Z9989 Dependence on other enabling machines and devices: Secondary | ICD-10-CM | POA: Diagnosis not present

## 2022-09-02 DIAGNOSIS — G4733 Obstructive sleep apnea (adult) (pediatric): Secondary | ICD-10-CM

## 2022-09-02 DIAGNOSIS — D303 Benign neoplasm of bladder: Secondary | ICD-10-CM | POA: Diagnosis not present

## 2022-09-02 DIAGNOSIS — Z85528 Personal history of other malignant neoplasm of kidney: Secondary | ICD-10-CM | POA: Diagnosis not present

## 2022-09-02 DIAGNOSIS — E039 Hypothyroidism, unspecified: Secondary | ICD-10-CM | POA: Diagnosis not present

## 2022-09-02 DIAGNOSIS — Z905 Acquired absence of kidney: Secondary | ICD-10-CM | POA: Diagnosis not present

## 2022-09-02 DIAGNOSIS — Z01818 Encounter for other preprocedural examination: Secondary | ICD-10-CM

## 2022-09-02 DIAGNOSIS — Z6841 Body Mass Index (BMI) 40.0 and over, adult: Secondary | ICD-10-CM | POA: Insufficient documentation

## 2022-09-02 DIAGNOSIS — N323 Diverticulum of bladder: Secondary | ICD-10-CM | POA: Diagnosis present

## 2022-09-02 DIAGNOSIS — G4730 Sleep apnea, unspecified: Secondary | ICD-10-CM | POA: Insufficient documentation

## 2022-09-02 DIAGNOSIS — Z87891 Personal history of nicotine dependence: Secondary | ICD-10-CM | POA: Diagnosis not present

## 2022-09-02 DIAGNOSIS — Z79899 Other long term (current) drug therapy: Secondary | ICD-10-CM | POA: Diagnosis not present

## 2022-09-02 DIAGNOSIS — N3020 Other chronic cystitis without hematuria: Secondary | ICD-10-CM | POA: Insufficient documentation

## 2022-09-02 DIAGNOSIS — I10 Essential (primary) hypertension: Secondary | ICD-10-CM | POA: Insufficient documentation

## 2022-09-02 HISTORY — PX: CYSTOSCOPY WITH BIOPSY: SHX5122

## 2022-09-02 HISTORY — DX: Urinary tract infection, site not specified: N39.0

## 2022-09-02 LAB — POCT I-STAT, CHEM 8
BUN: 16 mg/dL (ref 8–23)
Calcium, Ion: 1.12 mmol/L — ABNORMAL LOW (ref 1.15–1.40)
Chloride: 107 mmol/L (ref 98–111)
Creatinine, Ser: 0.6 mg/dL (ref 0.44–1.00)
Glucose, Bld: 87 mg/dL (ref 70–99)
HCT: 44 % (ref 36.0–46.0)
Hemoglobin: 15 g/dL (ref 12.0–15.0)
Potassium: 4 mmol/L (ref 3.5–5.1)
Sodium: 139 mmol/L (ref 135–145)
TCO2: 22 mmol/L (ref 22–32)

## 2022-09-02 SURGERY — CYSTOSCOPY, WITH BIOPSY
Anesthesia: General | Site: Bladder

## 2022-09-02 MED ORDER — PROPOFOL 10 MG/ML IV BOLUS
INTRAVENOUS | Status: AC
  Filled 2022-09-02: qty 20

## 2022-09-02 MED ORDER — SODIUM CHLORIDE 0.9 % IR SOLN
Status: DC | PRN
  Administered 2022-09-02: 3000 mL via INTRAVESICAL

## 2022-09-02 MED ORDER — PROPOFOL 10 MG/ML IV BOLUS
INTRAVENOUS | Status: DC | PRN
  Administered 2022-09-02: 200 mg via INTRAVENOUS
  Administered 2022-09-02: 50 mg via INTRAVENOUS

## 2022-09-02 MED ORDER — OXYBUTYNIN CHLORIDE 5 MG PO TABS: 5.0000 mg | ORAL_TABLET | Freq: Three times a day (TID) | ORAL | 1 refills | Status: DC | PRN

## 2022-09-02 MED ORDER — DEXAMETHASONE SODIUM PHOSPHATE 10 MG/ML IJ SOLN
INTRAMUSCULAR | Status: DC | PRN
  Administered 2022-09-02: 5 mg via INTRAVENOUS

## 2022-09-02 MED ORDER — ONDANSETRON HCL 4 MG/2ML IJ SOLN
INTRAMUSCULAR | Status: DC | PRN
  Administered 2022-09-02: 4 mg via INTRAVENOUS

## 2022-09-02 MED ORDER — LACTATED RINGERS IV SOLN: INTRAVENOUS | Status: DC | PRN

## 2022-09-02 MED ORDER — FENTANYL CITRATE (PF) 100 MCG/2ML IJ SOLN
INTRAMUSCULAR | Status: AC
  Filled 2022-09-02: qty 2

## 2022-09-02 MED ORDER — ACETAMINOPHEN 500 MG PO TABS
1000.0000 mg | ORAL_TABLET | Freq: Once | ORAL | Status: AC
  Administered 2022-09-02: 1000 mg via ORAL

## 2022-09-02 MED ORDER — ACETAMINOPHEN 500 MG PO TABS
ORAL_TABLET | ORAL | Status: AC
  Filled 2022-09-02: qty 2

## 2022-09-02 MED ORDER — CEFAZOLIN IN SODIUM CHLORIDE 3-0.9 GM/100ML-% IV SOLN
INTRAVENOUS | Status: AC
  Filled 2022-09-02: qty 100

## 2022-09-02 MED ORDER — CEFAZOLIN IN SODIUM CHLORIDE 3-0.9 GM/100ML-% IV SOLN
3.0000 g | Freq: Once | INTRAVENOUS | Status: AC
  Administered 2022-09-02: 3 g via INTRAVENOUS

## 2022-09-02 MED ORDER — PHENAZOPYRIDINE HCL 200 MG PO TABS: 200.0000 mg | ORAL_TABLET | Freq: Three times a day (TID) | ORAL | 0 refills | Status: AC | PRN

## 2022-09-02 MED ORDER — SODIUM CHLORIDE 0.9 % IV SOLN
INTRAVENOUS | Status: DC
Start: 2022-09-02 — End: 2022-09-02

## 2022-09-02 MED ORDER — FENTANYL CITRATE (PF) 250 MCG/5ML IJ SOLN
INTRAMUSCULAR | Status: DC | PRN
  Administered 2022-09-02: 25 ug via INTRAVENOUS
  Administered 2022-09-02: 50 ug via INTRAVENOUS

## 2022-09-02 MED ORDER — LIDOCAINE 2% (20 MG/ML) 5 ML SYRINGE
INTRAMUSCULAR | Status: DC | PRN
  Administered 2022-09-02: 60 mg via INTRAVENOUS

## 2022-09-02 MED ORDER — CIPROFLOXACIN HCL 500 MG PO TABS: 500.0000 mg | ORAL_TABLET | Freq: Two times a day (BID) | ORAL | 0 refills | Status: AC

## 2022-09-02 MED ORDER — EPHEDRINE SULFATE-NACL 50-0.9 MG/10ML-% IV SOSY
PREFILLED_SYRINGE | INTRAVENOUS | Status: DC | PRN
  Administered 2022-09-02: 10 mg via INTRAVENOUS

## 2022-09-02 SURGICAL SUPPLY — 18 items
BAG DRAIN URO-CYSTO SKYTR STRL (DRAIN) ×1 IMPLANT
BAG DRN UROCATH (DRAIN) ×1
CATH ROBINSON RED A/P 14FR (CATHETERS) IMPLANT
CLOTH BEACON ORANGE TIMEOUT ST (SAFETY) ×1 IMPLANT
ELECT REM PT RETURN 9FT ADLT (ELECTROSURGICAL) ×1
ELECTRODE REM PT RTRN 9FT ADLT (ELECTROSURGICAL) ×1 IMPLANT
GLOVE BIO SURGEON STRL SZ7.5 (GLOVE) ×1 IMPLANT
GOWN STRL REUS W/TWL LRG LVL3 (GOWN DISPOSABLE) ×1 IMPLANT
IV NS IRRIG 3000ML ARTHROMATIC (IV SOLUTION) IMPLANT
KIT TURNOVER CYSTO (KITS) ×1 IMPLANT
LOOP CUT BIPOLAR 24F LRG (ELECTROSURGICAL) IMPLANT
MANIFOLD NEPTUNE II (INSTRUMENTS) ×1 IMPLANT
NDL SAFETY ECLIP 18X1.5 (MISCELLANEOUS) IMPLANT
NS IRRIG 500ML POUR BTL (IV SOLUTION) IMPLANT
PACK CYSTO (CUSTOM PROCEDURE TRAY) ×1 IMPLANT
SYR 20ML LL LF (SYRINGE) IMPLANT
TUBE CONNECTING 12X1/4 (SUCTIONS) ×1 IMPLANT
WATER STERILE IRR 3000ML UROMA (IV SOLUTION) ×1 IMPLANT

## 2022-09-02 NOTE — Op Note (Signed)
Operative Note  Preoperative diagnosis:  1.  Erythematous bladder mucosa with an posterior bladder diverticulum  Postoperative diagnosis: Same  Procedure(s): Cystoscopy with bladder biopsy  Surgeon: Ellison Hughs, MD  Assistants:  None  Anesthesia:  General  Complications:  None  EBL: Less than 5 mL  Specimens: 1.  Bladder mucosal biopsy from within the posterior bladder diverticulum  Drains/Catheters: 1.  None none  Intraoperative findings:   Slightly erythematous bladder mucosa within posterior bladder wall diverticulum.  No other intravesical abnormalities were identified  Indication:  Brooke Duncan is a 76 y.o. female with a persistently erythematous area of the bladder mucosa within a posterior bladder wall diverticulum.  She has been consented for the above procedures, voices understanding and wishes to proceed.  Description of procedure:  After informed consent was obtained, the patient was brought to the operating room and general LMA anesthesia was administered. The patient was then placed in the dorsolithotomy position and prepped and draped in the usual sterile fashion. A timeout was performed. A 23 French rigid cystoscope was then inserted into the urethral meatus and advanced into the bladder under direct vision. A complete bladder survey revealed the findings listed above.  A 26 French resectoscope with a bipolar loop working element was then inserted into the bladder.  Careful resection of the superficial aspects of the erythematous bladder mucosa with the posterior bladder wall diverticulum was carried out.  There was no evidence of bladder perforation.  The area of resection/biopsy was extensively fulgurated until hemostasis was achieved.  The bladder mucosal specimens were sent to pathology for permanent section.  The patient's bladder was drained.  She tolerated the procedure well and was transferred to the postanesthesia in stable condition.  Plan:  Follow-up on 09/12/2022 to discuss pathology results

## 2022-09-02 NOTE — H&P (Signed)
Office Visit Report     08/04/2022   --------------------------------------------------------------------------------   Brooke Duncan  MRN: [id number]  DOB: 02-08-1946, 76 year old Female  SSN:    PRIMARY CARE:  C Duane Lope, MD  REFERRING:  Lorin Picket A. MacDiarmid, MD  PROVIDER:  Rhoderick Moody, M.D.  LOCATION:  [facility] [phone]     --------------------------------------------------------------------------------   CC/HPI: RCC   Brooke Duncan is a 76 year old female with a history of stage T1a Fuhrman grade 1 RCC, status post left robotic nephrectomy on 08/21/2013. She also has a history of recurrent UTIs that have been managed with d-mannose, cranberry and probiotics. Noted to have a posterior bladder diverticulum that was incidentally identified on surveillance imaging. 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   01/06/22: The patient is here today for a routine follow-up. CT from 05/2021 showed only punctate stones in the lower pole of the right kidney with no evidence of RCC recurrence. Since her last visit, patient has been struggling with dysuria and pelvic pain. Recent urine culture showed mixed growth and was not treated with a course of antibiotics. She was previously prescribed a daily prophylactic dose of Keflex, which she states helped with dysuria, she has since stopped that regimen. She reports a weak force of stream, feels like she is emptying her bladder adequately. She has occasional episodes of urinary urgency with small volumes of leakage, but states that her mobility is likely the thing that is limiting her from getting to the bathroom in time. She denies interval stone passage, hematuria or flank pain.   04/04/22: The patient is  here today for a routine follow-up. She denies interval UTIs, dysuria or hematuria. She was started on tamsulosin at her last OV--she admits that she is only taking the medication every other day due to urinary urgency and frequency. She reports no new or prior neurologic sequelae. PVR approximately 430 mL. She is planning to have a lumpectomy in June to address her breast cancer.   08/04/22: The patient is here today for repeat cystoscopy after Dr. Sherron Monday found an erythematous area of bladder mucosa within her large mouth diverticulum. She is currently taking tamsulosin once daily and feels like she is emptying her bladder more effectively. She denies interval UTIs, dysuria or hematuria.     ALLERGIES: Adhesive tape    MEDICATIONS: Cephalexin 250 mg tablet 1 tablet PO Daily  Doxycycline Hyclate 100 mg capsule  Simvastatin 10 mg tablet  Tamsulosin Hcl 0.4 mg capsule 1 capsule PO Daily  Azo  Cephalexin 250 mg capsule 1 capsule PO Daily  D Mannus  Letrozole 2.5 mg tablet  Levothyroxine  Losartan Potassium 25 mg tablet  Prednisone  Probiotic  Vitamin B12  Vitamin C  Vitamin D3     GU PSH: Cystoscopy - 05/19/2022     NON-GU PSH: Hip Replacement, Left Parathyroidectomy Remove Kidney, Left     GU PMH: Bladder Diverticulum - 06/24/2022, - 05/19/2022, - 04/04/2022, - 01/06/2022 Chronic cystitis (w/o hematuria) - 06/24/2022, - 05/16/2022, - 04/04/2022, - 01/06/2022, - 05/25/2021, - 02/18/2021 Incomplete bladder emptying - 05/19/2022, - 05/16/2022, - 04/04/2022 Urinary Tract Inf, Unspec site - 05/19/2022 Dysuria - 01/06/2022 Renal cell carcinoma, left - 01/06/2022, - 02/18/2021 Renal calculus - 05/25/2021  PMH Notes: recently dx with breast cancer   NON-GU PMH: Hypercholesterolemia Hypertension    FAMILY HISTORY: Atrial Fibrillation - Runs in Family Cancer - Father Congestive Heart Failure - Runs in Family, Mother Diabetes - Father, Brother Heart Disease - Runs in Family prostate cancer in  father - Father   SOCIAL HISTORY: Marital Status: Married Preferred Language: English; Ethnicity: Not Hispanic Or Latino; Race: White Current Smoking Status: Patient does not smoke anymore.   Tobacco Use Assessment Completed: Used Tobacco in last 30 days? Has never drank.  Does not drink caffeine.    REVIEW OF SYSTEMS:    GU Review Female:   Patient denies frequent urination, hard to postpone urination, burning /pain with urination, get up at night to urinate, leakage of urine, stream starts and stops, trouble starting your stream, have to strain to urinate, and being pregnant.  Gastrointestinal (Upper):   Patient denies nausea, vomiting, and indigestion/ heartburn.  Gastrointestinal (Lower):   Patient denies diarrhea and constipation.  Constitutional:   Patient denies fever, night sweats, weight loss, and fatigue.  Skin:   Patient denies itching and skin rash/ lesion.  Eyes:   Patient denies blurred vision and double vision.  Ears/ Nose/ Throat:   Patient denies sore throat and sinus problems.  Hematologic/Lymphatic:   Patient denies swollen glands and easy bruising.  Cardiovascular:   Patient denies leg swelling and chest pains.  Respiratory:   Patient denies cough and shortness of breath.  Endocrine:   Patient denies excessive thirst.  Musculoskeletal:   Patient denies back pain and joint pain.  Neurological:   Patient denies headaches and dizziness.  Psychologic:   Patient denies depression and anxiety.   VITAL SIGNS:      08/04/2022 03:01 PM  Weight 265 lb / 120.2 kg  Height 66 in / 167.64 cm  BP 114/72 mmHg  Pulse 97 /min  Temperature 97.7 F / 36.5 C  BMI 42.8 kg/m   GU PHYSICAL EXAMINATION:    Urethral Meatus: Normal size. Normal position. No discharge.  Urethra: No tenderness, no mass, no scarring. No hypermobility. No leakage.   MULTI-SYSTEM PHYSICAL EXAMINATION:    Constitutional: Well-nourished. No physical deformities. Normally developed. Good grooming.   Neurologic / Psychiatric: Oriented to time, oriented to place, oriented to person. No depression, no anxiety, no agitation.  Gastrointestinal: Obese abdomen. No mass, no tenderness, no rigidity.      PAST DATA REVIEW: None   PROCEDURES:         Flexible Cystoscopy - 52000  Risks, benefits, and some of the potential complications of the procedure were discussed at length with the patient including infection, bleeding, voiding discomfort, urinary retention, fever, chills, sepsis, and others. All questions were answered. Informed consent was obtained. Antibiotic prophylaxis was given. Sterile technique and intraurethral analgesia were used.  Meatus:  Normal size. Normal location. Normal condition.  Urethra:  No hypermobility. No leakage.  Ureteral Orifices:  Normal location. Normal size. Normal shape. Effluxed clear urine.  Bladder:  No trabeculation. No tumors. Normal mucosa. No stones. Large mouth bladder diverticulum involving the left posterior bladder wall with diffusely erythematous mucosa along with a small area of bullous edema vs papillary lesion      The lower urinary tract was carefully examined. The procedure was well-tolerated and without complications. Antibiotic instructions were given. Instructions were given to call the office immediately for bloody urine, difficulty urinating, urinary retention, painful or frequent urination, fever, chills, nausea, vomiting or other illness. The patient stated that  she understood these instructions and would comply with them.         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 The mucosal appearance is similar to the photographs taken by Dr. Matilde Sprang during his recent cystoscopy. Recommend bladder biopsy to rule out CIS/urothelial carcinoma   The risks, benefits and alternatives of cystoscopy with bladder was discussed with the patient. The risks include, but are not limited to, bleeding, urinary tract infection, bladder perforation requiring prolonged catheterization and/or open bladder repair, ureteral obstruction, voiding dysfunction and the inherent risks of general anesthesia. The patient voices understanding and wishes to proceed.

## 2022-09-02 NOTE — Discharge Instructions (Signed)

## 2022-09-02 NOTE — Transfer of Care (Signed)
Immediate Anesthesia Transfer of Care Note  Patient: Brooke Duncan  Procedure(s) Performed: CYSTOSCOPY WITH BLADDER BIOPSY (Bladder)  Patient Location: PACU  Anesthesia Type:General  Level of Consciousness: awake, alert , oriented and patient cooperative  Airway & Oxygen Therapy: Patient Spontanous Breathing  Post-op Assessment: Report given to RN and Post -op Vital signs reviewed and stable  Post vital signs: Reviewed and stable  Last Vitals:  Vitals Value Taken Time  BP 127/67 09/02/22 1517  Temp    Pulse 86 09/02/22 1519  Resp 19 09/02/22 1519  SpO2 97 % 09/02/22 1519  Vitals shown include unvalidated device data.  Last Pain:  Vitals:   09/02/22 1256  TempSrc: Oral  PainSc: 0-No pain      Patients Stated Pain Goal: 5 ([id number])  Complications: No notable events documented.

## 2022-09-02 NOTE — Anesthesia Procedure Notes (Signed)
Procedure Name: LMA Insertion Date/Time: 09/02/2022 2:50 PM  Performed by: Rogers Blocker, CRNAPre-anesthesia Checklist: Patient identified, Emergency Drugs available, Suction available and Patient being monitored Patient Re-evaluated:Patient Re-evaluated prior to induction Oxygen Delivery Method: Circle System Utilized Preoxygenation: Pre-oxygenation with 100% oxygen Induction Type: IV induction Ventilation: Mask ventilation without difficulty LMA: LMA inserted LMA Size: 4.0 Number of attempts: 1 Placement Confirmation: positive ETCO2 Tube secured with: Tape Dental Injury: Teeth and Oropharynx as per pre-operative assessment

## 2022-09-02 NOTE — Anesthesia Preprocedure Evaluation (Addendum)
Anesthesia Evaluation  Patient identified by MRN, date of birth, ID band Patient awake    Reviewed: Allergy & Precautions, NPO status , Patient's Chart, lab work & pertinent test results  Airway Mallampati: II  TM Distance: >3 FB Neck ROM: Full    Dental no notable dental hx.    Pulmonary sleep apnea and Continuous Positive Airway Pressure Ventilation , former smoker,    Pulmonary exam normal        Cardiovascular hypertension, Pt. on medications Normal cardiovascular exam+ Valvular Problems/Murmurs      Neuro/Psych negative neurological ROS  negative psych ROS   GI/Hepatic negative GI ROS, Neg liver ROS,   Endo/Other  Hypothyroidism Morbid obesity  Renal/GU Renal disease     Musculoskeletal  (+) Arthritis ,   Abdominal (+) + obese,   Peds  Hematology negative hematology ROS (+)   Anesthesia Other Findings BLADDER LESION  Reproductive/Obstetrics                            Anesthesia Physical Anesthesia Plan  ASA: 3  Anesthesia Plan: General   Post-op Pain Management:    Induction: Intravenous  PONV Risk Score and Plan: 3 and Ondansetron, Dexamethasone and Treatment may vary due to age or medical condition  Airway Management Planned: LMA  Additional Equipment:   Intra-op Plan:   Post-operative Plan: Extubation in OR  Informed Consent: I have reviewed the patients History and Physical, chart, labs and discussed the procedure including the risks, benefits and alternatives for the proposed anesthesia with the patient or authorized representative who has indicated his/her understanding and acceptance.     Dental advisory given  Plan Discussed with: CRNA  Anesthesia Plan Comments:         Anesthesia Quick Evaluation

## 2022-09-04 NOTE — Anesthesia Postprocedure Evaluation (Signed)
Anesthesia Post Note  Patient: Brooke Duncan  Procedure(s) Performed: CYSTOSCOPY WITH BLADDER BIOPSY (Bladder)     Patient location during evaluation: PACU Anesthesia Type: General Level of consciousness: awake Pain management: pain level controlled Vital Signs Assessment: post-procedure vital signs reviewed and stable Respiratory status: spontaneous breathing, nonlabored ventilation, respiratory function stable and patient connected to nasal cannula oxygen Cardiovascular status: blood pressure returned to baseline and stable Postop Assessment: no apparent nausea or vomiting Anesthetic complications: no   No notable events documented.  Last Vitals:  Vitals:   09/02/22 1520 09/02/22 1607  BP: 127/67 (!) 147/83  Pulse: 81 80  Resp: 18 19  Temp: 36.6 C   SpO2: 97% 99%    Last Pain:  Vitals:   09/02/22 1607  TempSrc:   PainSc: 0-No pain                 Judia Arnott P Jakerra Floyd

## 2022-09-05 ENCOUNTER — Encounter (HOSPITAL_BASED_OUTPATIENT_CLINIC_OR_DEPARTMENT_OTHER): Payer: Self-pay | Admitting: Urology

## 2022-09-05 LAB — SURGICAL PATHOLOGY

## 2022-10-17 ENCOUNTER — Ambulatory Visit: Payer: Self-pay | Admitting: General Surgery

## 2022-10-17 DIAGNOSIS — C181 Malignant neoplasm of appendix: Secondary | ICD-10-CM

## 2022-10-17 DIAGNOSIS — K6389 Other specified diseases of intestine: Secondary | ICD-10-CM

## 2022-10-17 NOTE — H&P (View-Only) (Signed)
REFERRING PHYSICIAN:  Ronnette Juniper, MD   PROVIDER:  Monico Blitz, MD   MRN: [id number] DOB: February 06, 1946 DATE OF ENCOUNTER: 10/17/2022   Subjective    Chief Complaint: New Consultation       History of Present Illness: Brooke Duncan is a 76 y.o. female who is seen today as an office consultation at the request of Dr. Therisa Duncan for evaluation of New Consultation .  Patient presents to the office for evaluation of a newly discovered colon mass.  She underwent colonoscopy on 10/11/2022.  This was a follow-up due to personal history of colon polyps.  A 5 mm polyp was found in the rectum and was removed piecemeal.  A sessile fungating frond-like villous possibly infiltrative mass was found at the appendiceal orifice.  It measured approximately 3 to 4 cm.  This was biopsied.  Another 6 mm polyp was found in the ascending colon and removed.  Rectal polyp was noted to be hyperplastic.  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  Ascending colon polyp was also tubular adenoma sessile serrated.  Patient has a history of a robotic left nephrectomy and laparoscopic cholecystectomy.  She has several orthopedic issues and is scheduled to undergo shoulder surgery in early December.  She reports regular bowel habits and denies any problems with chronic diarrhea.     Review of Systems: A complete review of systems was obtained from the patient.  I have reviewed this information and discussed as appropriate with the patient.  See HPI as well for other ROS.     Medical History: Past Medical History      Past Medical History:  Diagnosis Date   Arthritis     Chronic kidney disease     History of cancer     Hypertension     Sleep apnea             Patient Active Problem List  Diagnosis   Malignant neoplasm of upper-outer quadrant of left breast in female, estrogen receptor positive    Absent kidney   Essential (primary) hypertension   Hypothyroidism       Past Surgical History       Past Surgical History:  Procedure Laterality Date   hysteroscopy with D&C N/A 04/08/2021   MASTECTOMY PARTIAL / LUMPECTOMY Left 07/14/2022   CHOLECYSTECTOMY N/A      2005   NEPHRECTOMY Left      2014   PARATHYROIDECTOMY N/A      06/2020   THYROIDECTOMY TOTAL Bilateral      1995        Allergies  No Known Allergies           Current Outpatient Medications on File Prior to Visit  Medication Sig Dispense Refill   ibuprofen (MOTRIN) 600 MG tablet Take 600 mg by mouth every 6 (six) hours as needed       letrozole (FEMARA) 2.5 mg tablet Take 1 tablet by mouth once daily       cephalexin (KEFLEX) 250 MG capsule         cholecalciferol (VITAMIN D3) 2,000 unit capsule Take by mouth once daily       cranberry fruit extract (CRANBERRY JUICE POWDER ORAL) Take 2 capsules by mouth once daily       d-mannose, bulk, 99 % Powd Take 4 capsules by mouth once daily       levothyroxine (SYNTHROID) 150 MCG tablet levothyroxine 150 mcg tablet       losartan (  COZAAR) 25 MG tablet losartan 25 mg tablet       simvastatin (ZOCOR) 10 MG tablet simvastatin 10 mg tablet        No current facility-administered medications on file prior to visit.      Family History       Family History  Problem Relation Age of Onset   Obesity Mother     Skin cancer Father     Obesity Father     High blood pressure (Hypertension) Father     Hyperlipidemia (Elevated cholesterol) Father     Diabetes Father     Coronary Artery Disease (Blocked arteries around heart) Father     Obesity Brother     High blood pressure (Hypertension) Brother     Hyperlipidemia (Elevated cholesterol) Brother     Coronary Artery Disease (Blocked arteries around heart) Brother     Diabetes Brother          Social History        Tobacco Use  Smoking Status Former  Smokeless Tobacco Never  Tobacco Comments    Quit smoking 50 years ago      Social History  Social History          Socioeconomic History   Marital status: Widowed  Tobacco Use   Smoking status: Former   Smokeless tobacco: Never   Tobacco comments:      Quit smoking 50 years ago  Vaping Use   Vaping Use: Never used  Substance and Sexual Activity   Alcohol use: Never   Drug use: Never        Objective:      There were no vitals filed for this visit.    Exam Gen: NAD CV:RRR Lungs: CTA Abd: soft       Labs, Imaging and Diagnostic Testing:     Assessment and Plan:  Diagnoses and all orders for this visit:   Mass of colon   76 year old female who presents to the office for a colon mass seen on biopsy.  There is no sign of malignancy at this time and therefore we will forego CT scans.  I have recommended that we do a robotic right colectomy in case there is cancer inside that was unable to be biopsied.  We have discussed this in detail including the risks and benefits of this surgical approach.  I believe she understands this and wishes to proceed with surgery.  She is scheduled to undergo a shoulder surgery in December.  She will discuss the timing of this with her orthopedic surgeon.   The surgery and anatomy were described to the patient as well as the risks of surgery and the possible complications.  These include: Bleeding, deep abdominal infections and possible wound complications such as hernia and infection, damage to adjacent structures, leak of surgical connections, which can lead to other surgeries and possibly an ostomy, possible need for other procedures, such as abscess drains in radiology, possible prolonged hospital stay, possible diarrhea from removal of part of the colon, possible constipation from narcotics, possible bowel, bladder or sexual dysfunction if having rectal surgery, prolonged fatigue/weakness or appetite loss, possible early recurrence of of disease, possible complications of their medical problems such as heart disease or arrhythmias or lung problems, death (less  than 1%). I believe the patient understands and wishes to proceed with the surgery.    No follow-ups on file.    Rosario Adie, MD Colon and Rectal Surgery [facility]

## 2022-10-17 NOTE — H&P (Signed)
REFERRING PHYSICIAN:  Ronnette Juniper, MD   PROVIDER:  Monico Blitz, MD   MRN: [id number] DOB: [birth year] DATE OF ENCOUNTER: 10/17/2022   Subjective    Chief Complaint: New Consultation       History of Present Illness: [facility] is a 76 y.o. female who is seen today as an office consultation at the request of Dr. Therisa Doyne for evaluation of New Consultation .  Patient presents to the office for evaluation of a newly discovered colon mass.  She underwent colonoscopy on 10/11/2022.  This was a follow-up due to personal history of colon polyps.  A 5 mm polyp was found in the rectum and was removed piecemeal.  A sessile fungating frond-like villous possibly infiltrative mass was found at the appendiceal orifice.  It measured approximately 3 to 4 cm.  This was biopsied.  Another 6 mm polyp was found in the ascending colon and removed.  Rectal polyp was noted to be hyperplastic.  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  Ascending colon polyp was also tubular adenoma sessile serrated.  Patient has a history of a robotic left nephrectomy and laparoscopic cholecystectomy.  She has several orthopedic issues and is scheduled to undergo shoulder surgery in early December.  She reports regular bowel habits and denies any problems with chronic diarrhea.     Review of Systems: A complete review of systems was obtained from the patient.  I have reviewed this information and discussed as appropriate with the patient.  See HPI as well for other ROS.     Medical History: Past Medical History      Past Medical History:  Diagnosis Date   Arthritis     Chronic kidney disease     History of cancer     Hypertension     Sleep apnea             Patient Active Problem List  Diagnosis   Malignant neoplasm of upper-outer quadrant of left breast in female, estrogen receptor positive    Absent kidney   Essential (primary) hypertension   Hypothyroidism       Past Surgical History       Past Surgical History:  Procedure Laterality Date   hysteroscopy with D&C N/A 04/08/2021   MASTECTOMY PARTIAL / LUMPECTOMY Left 07/14/2022   CHOLECYSTECTOMY N/A      2005   NEPHRECTOMY Left      2014   PARATHYROIDECTOMY N/A      06/2020   THYROIDECTOMY TOTAL Bilateral      1995        Allergies  No Known Allergies           Current Outpatient Medications on File Prior to Visit  Medication Sig Dispense Refill   ibuprofen (MOTRIN) 600 MG tablet Take 600 mg by mouth every 6 (six) hours as needed       letrozole (FEMARA) 2.5 mg tablet Take 1 tablet by mouth once daily       cephalexin (KEFLEX) 250 MG capsule         cholecalciferol (VITAMIN D3) 2,000 unit capsule Take by mouth once daily       cranberry fruit extract (CRANBERRY JUICE POWDER ORAL) Take 2 capsules by mouth once daily       d-mannose, bulk, 99 % Powd Take 4 capsules by mouth once daily       levothyroxine (SYNTHROID) 150 MCG tablet levothyroxine 150 mcg tablet       losartan (  COZAAR) 25 MG tablet losartan 25 mg tablet       simvastatin (ZOCOR) 10 MG tablet simvastatin 10 mg tablet        No current facility-administered medications on file prior to visit.      Family History       Family History  Problem Relation Age of Onset   Obesity Mother     Skin cancer Father     Obesity Father     High blood pressure (Hypertension) Father     Hyperlipidemia (Elevated cholesterol) Father     Diabetes Father     Coronary Artery Disease (Blocked arteries around heart) Father     Obesity Brother     High blood pressure (Hypertension) Brother     Hyperlipidemia (Elevated cholesterol) Brother     Coronary Artery Disease (Blocked arteries around heart) Brother     Diabetes Brother          Social History        Tobacco Use  Smoking Status Former  Smokeless Tobacco Never  Tobacco Comments    Quit smoking 50 years ago      Social History  Social History          Socioeconomic History   Marital status: Widowed  Tobacco Use   Smoking status: Former   Smokeless tobacco: Never   Tobacco comments:      Quit smoking 50 years ago  Vaping Use   Vaping Use: Never used  Substance and Sexual Activity   Alcohol use: Never   Drug use: Never        Objective:      There were no vitals filed for this visit.    Exam Gen: NAD CV:RRR Lungs: CTA Abd: soft       Labs, Imaging and Diagnostic Testing:     Assessment and Plan:  Diagnoses and all orders for this visit:   Mass of colon   76 year old female who presents to the office for a colon mass seen on biopsy.  There is no sign of malignancy at this time and therefore we will forego CT scans.  I have recommended that we do a robotic right colectomy in case there is cancer inside that was unable to be biopsied.  We have discussed this in detail including the risks and benefits of this surgical approach.  I believe she understands this and wishes to proceed with surgery.  She is scheduled to undergo a shoulder surgery in December.  She will discuss the timing of this with her orthopedic surgeon.   The surgery and anatomy were described to the patient as well as the risks of surgery and the possible complications.  These include: Bleeding, deep abdominal infections and possible wound complications such as hernia and infection, damage to adjacent structures, leak of surgical connections, which can lead to other surgeries and possibly an ostomy, possible need for other procedures, such as abscess drains in radiology, possible prolonged hospital stay, possible diarrhea from removal of part of the colon, possible constipation from narcotics, possible bowel, bladder or sexual dysfunction if having rectal surgery, prolonged fatigue/weakness or appetite loss, possible early recurrence of of disease, possible complications of their medical problems such as heart disease or arrhythmias or lung problems, death (less  than 1%). I believe the patient understands and wishes to proceed with the surgery.    No follow-ups on file.    Rosario Adie, MD Colon and Rectal Surgery [facility]

## 2022-10-26 NOTE — Patient Instructions (Addendum)
SURGICAL WAITING ROOM VISITATION Patients having surgery or a procedure may have no more than 2 support people in the waiting area - these visitors may rotate.   Children under the age of 51 must have an adult with them who is not the patient. If the patient needs to [facility] at the [facility] during part of their recovery, the visitor guidelines for inpatient rooms apply. Pre-op nurse will coordinate an appropriate time for 1 support person to accompany patient in pre-op.  This support person may not rotate.    Please refer to the [facility] website for the visitor guidelines for Inpatients (after your surgery is over and you are in a regular room).    Your procedure is scheduled on: 11/03/22   Report to [facility] Main Entrance    Report to admitting at 5:15 AM   Call this number if you have problems the morning of surgery [phone]   Follow a clear liquid diet the day before surgery   You may have the following liquids until 4:30 AM DAY OF SURGERY  Water Non-Citrus Juices (without pulp, NO RED) Carbonated Beverages Black Coffee (NO MILK/CREAM OR CREAMERS, sugar ok)  Clear Tea (NO MILK/CREAM OR CREAMERS, sugar ok) regular and decaf                             Plain Jell-O (NO RED)                                           Fruit ices (not with fruit pulp, NO RED)                                     Popsicles (NO RED)                                                               Sports drinks like Gatorade (NO RED)              Drink 2 Ensure drinks AT 10:00 PM the night before surgery.     The day of surgery:  Drink ONE (1) Pre-Surgery Clear Ensure at 4:30 AM the morning of surgery. Drink in one sitting. Do not sip.  This drink was given to you during your [facility]  pre-op appointment visit. Nothing else to drink after completing the  Pre-Surgery Clear Ensure.          If you have questions, please contact your surgeon's office.   FOLLOW BOWEL PREP AND ANY ADDITIONAL  PRE OP INSTRUCTIONS YOU RECEIVED FROM YOUR SURGEON'S OFFICE!!!     Oral Hygiene is also important to reduce your risk of infection.                                    Remember - BRUSH YOUR TEETH THE MORNING OF SURGERY WITH YOUR REGULAR TOOTHPASTE   Take these medicines the morning of surgery with A SIP OF WATER: Tylenol, Inhalers, Keflex, Levothyroxine, Tamsulosin, Tramadol  You may not have any metal on your body including hair pins, jewelry, and body piercing             Do not wear make-up, lotions, powders, perfumes, or deodorant  Do not wear nail polish including gel and S&S, artificial/acrylic nails, or any other type of covering on natural nails including finger and toenails. If you have artificial nails, gel coating, etc. that needs to be removed by a nail salon please have this removed prior to surgery or surgery may need to be canceled/ delayed if the surgeon/ anesthesia feels like they are unable to be safely monitored.   Do not shave  48 hours prior to surgery.    Do not bring valuables to the [facility]. [facility].   Bring small overnight bag day of surgery.   DO NOT [facility]. [facility] WILL DISPENSE MEDICATIONS LISTED ON YOUR MEDICATION LIST TO YOU DURING YOUR ADMISSION [facility]!   Special Instructions: Bring a copy of your healthcare power of attorney and living will documents the day of surgery if you haven't scanned them before.              Please read over the following fact sheets you were given: IF [facility] [phone] [facility]    If you received a COVID test during your pre-op visit  it is requested that you wear a mask when out in public, [facility] away from anyone that may not be feeling well and notify your surgeon if you develop symptoms. If you test positive for Covid or have been in contact with  anyone that has tested positive in the last 10 days please notify you surgeon.    [facility] - Preparing for Surgery Before surgery, you can play an important role.  Because skin is not sterile, your skin needs to be as free of germs as possible.  You can reduce the number of germs on your skin by washing with CHG (chlorahexidine gluconate) soap before surgery.  CHG is an antiseptic cleaner which kills germs and bonds with the skin to continue killing germs even after washing. Please DO NOT use if you have an allergy to CHG or antibacterial soaps.  If your skin becomes reddened/irritated stop using the CHG and inform your nurse when you arrive at [facility]. Do not shave (including legs and underarms) for at least 48 hours prior to the first CHG shower.  You may shave your face/neck.  Please follow these instructions carefully:  1.  Shower with CHG Soap the night before surgery and the  morning of surgery.  2.  If you choose to wash your hair, wash your hair first as usual with your normal  shampoo.  3.  After you shampoo, rinse your hair and body thoroughly to remove the shampoo.                             4.  Use CHG as you would any other liquid soap.  You can apply chg directly to the skin and wash.  Gently with a scrungie or clean washcloth.  5.  Apply the CHG Soap to your body ONLY FROM THE NECK DOWN.   Do   not use on face/ open  Wound or open sores. Avoid contact with eyes, ears mouth and   genitals (private parts).                       Wash face,  Genitals (private parts) with your normal soap.             6.  Wash thoroughly, paying special attention to the area where your    surgery  will be performed.  7.  Thoroughly rinse your body with warm water from the neck down.  8.  DO NOT shower/wash with your normal soap after using and rinsing off the CHG Soap.                9.  Pat yourself dry with a clean towel.            10.  Wear clean pajamas.             11.  Place clean sheets on your bed the night of your first shower and do not  sleep with pets. Day of Surgery : Do not apply any lotions/deodorants the morning of surgery.  Please wear clean clothes to the [facility]/surgery [facility].  FAILURE TO FOLLOW THESE INSTRUCTIONS MAY RESULT IN THE CANCELLATION OF YOUR SURGERY  PATIENT SIGNATURE_________________________________  NURSE SIGNATURE__________________________________  ________________________________________________________________________  [facility]  An incentive spirometer is a tool that can help keep your lungs clear and active. This tool measures how well you are filling your lungs with each breath. Taking long deep breaths may help reverse or decrease the chance of developing breathing (pulmonary) problems (especially infection) following: A long period of time when you are unable to move or be active. BEFORE THE PROCEDURE  If the spirometer includes an indicator to show your best effort, your nurse or respiratory therapist will set it to a desired goal. If possible, sit up straight or lean slightly forward. Try not to slouch. Hold the incentive spirometer in an upright position. INSTRUCTIONS FOR USE  Sit on the edge of your bed if possible, or sit up as far as you can in bed or on a chair. Hold the incentive spirometer in an upright position. Breathe out normally. Place the mouthpiece in your mouth and seal your lips tightly around it. Breathe in slowly and as deeply as possible, raising the piston or the ball toward the top of the column. Hold your breath for 3-5 seconds or for as long as possible. Allow the piston or ball to fall to the bottom of the column. Remove the mouthpiece from your mouth and breathe out normally. Rest for a few seconds and repeat Steps 1 through 7 at least 10 times every 1-2 hours when you are awake. Take your time and take a few normal breaths between deep breaths. The spirometer may include an  indicator to show your best effort. Use the indicator as a goal to work toward during each repetition. After each set of 10 deep breaths, practice coughing to be sure your lungs are clear. If you have an incision (the cut made at the time of surgery), support your incision when coughing by placing a pillow or rolled up towels firmly against it. Once you are able to get out of bed, walk around indoors and cough well. You may stop using the incentive spirometer when instructed by your caregiver.  RISKS AND COMPLICATIONS Take your time so you do not get dizzy or light-headed. If you are in pain, you may  need to take or ask for pain medication before doing incentive spirometry. It is harder to take a deep breath if you are having pain. AFTER USE Rest and breathe slowly and easily. It can be helpful to keep track of a log of your progress. Your caregiver can provide you with a simple table to help with this. If you are using the spirometer at home, follow these instructions: [place] IF:  You are having difficultly using the spirometer. You have trouble using the spirometer as often as instructed. Your pain medication is not giving enough relief while using the spirometer. You develop fever of 100.5 F (38.1 C) or higher. SEEK IMMEDIATE MEDICAL CARE IF:  You cough up bloody sputum that had not been present before. You develop fever of 102 F (38.9 C) or greater. You develop worsening pain at or near the incision site. MAKE SURE YOU:  Understand these instructions. Will watch your condition. Will get help right away if you are not doing well or get worse. Document Released: 04/03/2007 Document Revised: 02/13/2012 Document Reviewed: 06/04/2007 [employer] Patient Information 2014 [employer], [place].   ________________________________________________________________________ WHAT IS A BLOOD TRANSFUSION? Blood Transfusion Information  A transfusion is the replacement of blood or some of its  parts. Blood is made up of multiple cells which provide different functions. Red blood cells carry oxygen and are used for blood loss replacement. White blood cells fight against infection. Platelets control bleeding. Plasma helps clot blood. Other blood products are available for specialized needs, such as hemophilia or other clotting disorders. BEFORE THE TRANSFUSION  Who gives blood for transfusions?  Healthy volunteers who are fully evaluated to make sure their blood is safe. This is blood bank blood. Transfusion therapy is the safest it has ever been in the practice of medicine. Before blood is taken from a donor, a complete history is taken to make sure that person has no history of diseases nor engages in risky social behavior (examples are intravenous drug use or sexual activity with multiple partners). The donor's travel history is screened to minimize risk of transmitting infections, such as malaria. The donated blood is tested for signs of infectious diseases, such as HIV and hepatitis. The blood is then tested to be sure it is compatible with you in order to minimize the chance of a transfusion reaction. If you or a relative donates blood, this is often done in anticipation of surgery and is not appropriate for emergency situations. It takes many days to process the donated blood. RISKS AND COMPLICATIONS Although transfusion therapy is very safe and saves many lives, the main dangers of transfusion include:  Getting an infectious disease. Developing a transfusion reaction. This is an allergic reaction to something in the blood you were given. Every precaution is taken to prevent this. The decision to have a blood transfusion has been considered carefully by your caregiver before blood is given. Blood is not given unless the benefits outweigh the risks. AFTER THE TRANSFUSION Right after receiving a blood transfusion, you will usually feel much better and more energetic. This is especially  true if your red blood cells have gotten low (anemic). The transfusion raises the level of the red blood cells which carry oxygen, and this usually causes an energy increase. The nurse administering the transfusion will monitor you carefully for complications. HOME CARE INSTRUCTIONS  No special instructions are needed after a transfusion. You may find your energy is better. Speak with your caregiver about any limitations on activity for  underlying diseases you may have. SEEK MEDICAL CARE IF:  Your condition is not improving after your transfusion. You develop redness or irritation at the intravenous (IV) site. SEEK IMMEDIATE MEDICAL CARE IF:  Any of the following symptoms occur over the next 12 hours: Shaking chills. You have a temperature by mouth above 102 F (38.9 C), not controlled by medicine. Chest, back, or muscle pain. People around you feel you are not acting correctly or are confused. Shortness of breath or difficulty breathing. Dizziness and fainting. You get a rash or develop hives. You have a decrease in urine output. Your urine turns a dark color or changes to pink, red, or brown. Any of the following symptoms occur over the next 10 days: You have a temperature by mouth above 102 F (38.9 C), not controlled by medicine. Shortness of breath. Weakness after normal activity. The white part of the eye turns yellow (jaundice). You have a decrease in the amount of urine or are urinating less often. Your urine turns a dark color or changes to pink, red, or brown. Document Released: 11/18/2000 Document Revised: 02/13/2012 Document Reviewed: 07/07/2008 [facility] Patient Information 2014 [place], [place].  _______________________________________________________________________

## 2022-10-26 NOTE — Progress Notes (Addendum)
COVID Vaccine Completed: yes  Date of COVID positive in last 90 days: no  PCP - Mardene Sayer, MD Cardiologist - n/a  Chest x-ray - 05/26/22 Epic EKG - 07/11/22 Epic Stress Test - n/a ECHO - n/a Cardiac Cath - n/a Pacemaker/ICD device last checked: n/a Spinal Cord Stimulator: n/a  Bowel Prep - Miralax, dulcolax, clears day before  Sleep Study - yes, positive CPAP - yes every night  Fasting Blood Sugar - n/a Checks Blood Sugar _____ times a day  Last dose of GLP1 agonist-  N/A GLP1 instructions:  N/A   Last dose of SGLT-2 inhibitors-  N/A SGLT-2 instructions: N/A   Blood Thinner Instructions: n/a Aspirin Instructions: Last Dose:  Activity level: Can perform activities of daily living without stopping and without symptoms of chest pain or shortness of breath. Does not do stairs due to mobility and weight     Anesthesia review: HTN, recent gastric virus, rash to groin that is getting better with cream  Patient denies shortness of breath, fever, cough and chest pain at PAT appointment  Patient verbalized understanding of instructions that were given to them at the PAT appointment. Patient was also instructed that they will need to review over the PAT instructions again at home before surgery.

## 2022-10-28 ENCOUNTER — Ambulatory Visit
Admission: RE | Admit: 2022-10-28 | Discharge: 2022-10-28 | Disposition: A | Discharge status: Home / Self Care | Payer: Medicare Other | Source: Ambulatory Visit | Attending: Family Medicine | Admitting: Family Medicine

## 2022-10-28 ENCOUNTER — Telehealth: Payer: Self-pay | Admitting: Emergency Medicine

## 2022-10-28 VITALS — BP 135/85 | HR 75 | Temp 98.8°F | Resp 18 | Ht 65.0 in | Wt 270.0 lb

## 2022-10-28 DIAGNOSIS — R197 Diarrhea, unspecified: Secondary | ICD-10-CM | POA: Diagnosis not present

## 2022-10-28 MED ORDER — CHOLESTYRAMINE 4 G PO PACK: PACK | ORAL | 11 refills | Status: DC

## 2022-10-28 NOTE — ED Provider Notes (Signed)
Brooke Duncan CARE    CSN: [id number] Arrival date & time: 10/28/22  1539      History   Chief Complaint Chief Complaint  Patient presents with   Diarrhea    HPI Brooke Duncan is a 76 y.o. female.   Five days ago patient developed rather sudden yellowish watery diarrhea without abdominal pain or fevers, chills, and sweats although she notes mild abdominal bloating.  She had mild nausea without vomiting.  Her bowel movements were somewhat explosive initially, and decreased in frequency when she switched to clear liquids.  She has now advanced her diet resulting in continued yellowish diarrhea not responding to Imodium.  She denies recent foreign travel, or drinking untreated water in a wilderness environment.  She denies recent changes in diet.  She does take maintenance Keflex '250mg'$  once daily for prophylaxis of UTI. Past surgical history of cholecystectomy.  She recalls that she has had occasional episodes of diarrhea in the past that resolved spontaneously. Patient was recently diagnosed with a colon mass, with negative biopsy (no present signs of malignancy).  She is scheduled for a right robotic colectomy.   The history is provided by the patient.    Past Medical History:  Diagnosis Date   Acquired solitary kidney    right side 2014   Arthritis    Complication of anesthesia    slow to wake, hard to take a deep breath with 1 surgery several yrs ago   Frequency of urination    Heart murmur    per told by pcp approx. 02/ 2022 heard a very faint murmur, told did need work-up done at this time   History of COVID-19 2021   all symptoms resolved   History of Hashimoto thyroiditis    s/p  total thyroidectomy 1995   History of hypercalcemia    s/p  parathryoidectomy 07/ 2021   History of renal cell carcinoma 2014   s/p  left nephrectomy, pt stated no other treatment   Hypertension    followed by pcp   Hypothyroidism, postsurgical 1995   followed by pcp in [place],  moved from [place] 01/ 2021   OSA on CPAP    Thickened endometrium    UTI (urinary tract infection)    finished keflex 08-12-2022   Wears glasses     Patient Active Problem List   Diagnosis Date Noted   Malignant neoplasm of upper-outer quadrant of left breast in female, estrogen receptor positive ([place]) 08/27/2021   Abnormal ultrasound of endometrium 04/08/2021    Past Surgical History:  Procedure Laterality Date   BREAST BIOPSY Left 08/24/2021   BREAST LUMPECTOMY WITH RADIOACTIVE SEED LOCALIZATION Left 07/14/2022   Procedure: LEFT BREAST LUMPECTOMY WITH RADIOACTIVE SEED LOCALIZATION;  Surgeon: Rolm Bookbinder, MD;  Location: [facility];  Service: General;  Laterality: Left;   CYSTOSCOPY  08/04/2022   in dr winter office   [place] N/A 09/02/2022   Procedure: CYSTOSCOPY WITH BLADDER BIOPSY;  Surgeon: Ceasar Mons, MD;  Location: [facility];  Service: Urology;  Laterality: N/A;  ONLY NEEDS 30 MIN   HYSTEROSCOPY WITH D & C N/A 04/08/2021   Procedure: DILATATION AND CURETTAGE /HYSTEROSCOPY, EXCISION OF VAGINAL LESION;  Surgeon: Delsa Bern, MD;  Location: [facility];  Service: Gynecology;  Laterality: N/A;   LAPAROSCOPIC CHOLECYSTECTOMY  2005   NEPHRECTOMY Left 2014   PARATHYROIDECTOMY  07/ 2021  in [place]  age 6   TOTAL HIP ARTHROPLASTY  Bilateral left 2015;  right 2009   TOTAL KNEE ARTHROPLASTY Left 2014   TOTAL THYROIDECTOMY Bilateral 1995    OB History   No obstetric history on file.      Home Medications    Prior to Admission medications   Medication Sig Start Date End Date Taking? Authorizing Provider  cholestyramine (QUESTRAN) 4 g packet Take one pack PO BID 10/28/22  Yes Nyan Dufresne, Ishmael Holter, MD  CRANBERRY PO Take 2 capsules by mouth daily.   Yes [provider]  D-MANNOSE PO Take 4 capsules by mouth daily.   Yes [provider]  acetaminophen (TYLENOL)  500 MG tablet Take 2 tablets (1,000 mg total) by mouth every 6 (six) hours as needed. Patient not taking: Reported on 10/28/2022 04/08/21   Delsa Bern, MD  albuterol (VENTOLIN HFA) 108 (90 Base) MCG/ACT inhaler Inhale 2 puffs into the lungs every 4 (four) hours as needed for shortness of breath or wheezing. 05/09/22   [provider]  Ascorbic Acid (VITAMIN C PO) Take 1 tablet by mouth daily.    [provider]  cephALEXin (KEFLEX) 250 MG capsule Take 250 mg by mouth daily.    [provider]  Cholecalciferol (VITAMIN D3 SUPER STRENGTH) 50 MCG (2000 UT) CAPS Take 2,000 Units by mouth daily.    [provider]  clotrimazole (LOTRIMIN) 1 % cream Apply 1 application  topically 2 (two) times daily as needed (irritation).    [provider]  ergocalciferol (VITAMIN D2) 1.25 MG (50000 UT) capsule Take 50,000 Units by mouth once a week. Patient not taking: Reported on 10/28/2022    [provider]  erythromycin ophthalmic ointment Place 1 application  into both eyes daily as needed (blepharitis).    [provider]  hydrocortisone 2.5 % cream Apply 1 Application topically 2 (two) times daily as needed (irritation).    [provider]  ibuprofen (ADVIL) 600 MG tablet Take 1 tablet (600 mg total) by mouth every 6 (six) hours as needed. 04/08/21   Delsa Bern, MD  letrozole ([place]) 2.5 MG tablet TAKE 1 TABLET BY MOUTH EVERY DAY Patient taking differently: Take 2.5 mg by mouth every evening. 08/09/22   Nicholas Lose, MD  levothyroxine (SYNTHROID) 150 MCG tablet Take 150 mcg by mouth daily before breakfast.    [provider]  losartan (COZAAR) 50 MG tablet Take 50 mg by mouth daily.    [provider]  oxybutynin (DITROPAN) 5 MG tablet Take 1 tablet (5 mg total) by mouth every 8 (eight) hours as needed for bladder spasms. Patient not taking: Reported on 10/25/2022 09/02/22   Ceasar Mons, MD  phenazopyridine  (PYRIDIUM) 200 MG tablet Take 1 tablet (200 mg total) by mouth 3 (three) times daily as needed (for pain with urination). Patient not taking: Reported on 10/25/2022 09/02/22 09/02/23  Ceasar Mons, MD  Probiotic Product (PROBIOTIC DAILY PO) Take 1 capsule by mouth daily. 1 tab daily    [provider]  simvastatin (ZOCOR) 10 MG tablet Take 10 mg by mouth at bedtime.    [provider]  tamsulosin (FLOMAX) 0.4 MG CAPS capsule Take 1 capsule (0.4 mg total) by mouth daily. Patient taking differently: Take 0.4 mg by mouth every other day. 03/10/22   Nicholas Lose, MD  traMADol (ULTRAM) 50 MG tablet Take 50 mg by mouth every 6 (six) hours as needed for moderate pain.    [provider]  vitamin B-12 (CYANOCOBALAMIN) 1000 MCG tablet Take 1,000  mcg by mouth daily.    [provider]    Family History Family History  Problem Relation Age of Onset   Melanoma Father    Prostate cancer Father     Social History Social History   Tobacco Use   Smoking status: Former    Packs/day: 1.50    Years: 12.00    Total pack years: 18.00    Types: Cigarettes    Quit date: 04/02/1974    Years since quitting: 48.6   Smokeless tobacco: Never  Vaping Use   Vaping Use: Never used  Substance Use Topics   Alcohol use: Yes    Comment: occasional   Drug use: Never     Allergies   Wound dressing adhesive and Tape   Review of Systems Review of Systems  Constitutional:  Negative for activity change, appetite change, chills, diaphoresis, fatigue and fever.  HENT: Negative.    Eyes: Negative.   Respiratory: Negative.    Cardiovascular: Negative.   Gastrointestinal:  Positive for diarrhea and nausea. Negative for abdominal distention, abdominal pain, blood in stool, constipation, rectal pain and vomiting.  Genitourinary: Negative.   Musculoskeletal: Negative.   Skin: Negative.   Neurological: Negative.   Hematological:  Negative for adenopathy.      Physical Exam Triage Vital Signs ED Triage Vitals  Enc Vitals Group     BP 10/28/22 1641 135/85     Pulse Rate 10/28/22 1641 75     Resp 10/28/22 1641 18     Temp 10/28/22 1641 98.8 F (37.1 C)     Temp Source 10/28/22 1641 Oral     SpO2 10/28/22 1641 97 %     Weight 10/28/22 1644 270 lb (122.5 kg)     Height 10/28/22 1644 '5\' 5"'$  (1.651 m)     Head Circumference --      Peak Flow --      Pain Score 10/28/22 1644 0     Pain Loc --      Pain Edu? --      Excl. in [place]? --    No data found.  Updated Vital Signs BP 135/85 (BP Location: Left Arm)   Pulse 75   Temp 98.8 F (37.1 C) (Oral)   Resp 18   Ht '5\' 5"'$  (1.651 m)   Wt 122.5 kg   SpO2 97%   BMI 44.93 kg/m   Visual Acuity Right Eye Distance:   Left Eye Distance:   Bilateral Distance:    Right Eye Near:   Left Eye Near:    Bilateral Near:     Physical Exam Vitals and nursing note reviewed.  Constitutional:      General: She is not in acute distress.    Appearance: She is obese. She is not ill-appearing or toxic-appearing.  HENT:     Head: Normocephalic.     Mouth/Throat:     Mouth: Mucous membranes are moist.  Eyes:     Conjunctiva/sclera: Conjunctivae normal.     Pupils: Pupils are equal, round, and reactive to light.  Cardiovascular:     Rate and Rhythm: Normal rate and regular rhythm.     Heart sounds: Normal heart sounds.  Pulmonary:     Breath sounds: Normal breath sounds.  Abdominal:     General: Bowel sounds are increased. There is no distension.     Palpations: Abdomen is soft. There is no hepatomegaly, splenomegaly or mass.     Tenderness: There is no abdominal tenderness. There is no  right CVA tenderness, left CVA tenderness, guarding or rebound. Negative signs include psoas sign and obturator sign.     Hernia: No hernia is present.  Musculoskeletal:     Cervical back: Neck supple.     Right lower leg: No edema.     Left lower leg: No edema.  Lymphadenopathy:     Cervical: No cervical  adenopathy.  Skin:    General: Skin is warm and dry.     Coloration: Skin is not jaundiced.     Findings: No rash.  Neurological:     Mental Status: She is alert and oriented to person, place, and time.      UC Treatments / Results  Labs (all labs ordered are listed, but only abnormal results are displayed) Labs Reviewed  GASTROINTESTINAL PANEL BY PCR, STOOL (REPLACES STOOL CULTURE)  CLOSTRIDIUM DIFFICILE BY PCR  COMPLETE METABOLIC PANEL WITH GFR  MAGNESIUM    EKG   Radiology No results found.  Procedures Procedures (including critical care time)  Medications Ordered in UC Medications - No data to display  Initial Impression / Assessment and Plan / UC Course  I have reviewed the triage vital signs and the nursing notes.  Pertinent labs & imaging results that were available during my care of the patient were reviewed by me and considered in my medical decision making (see chart for details).    Concern for possible C. Difficile colitis because of chronic Keflex treatment. However, she has no fevers, chills, and sweats, abdominal pain or hematochezea.  Her present yellowish diarrhea is suggestive of excess bile production resulting in loose stools (note past history of cholecystectomy). Will begin Questran (bile acid sequestrant) to control diarrhea.   Labs pending as above. Followup with Family Doctor in about 4 days.     Final Clinical Impressions(s) / UC Diagnoses   Final diagnoses:  Diarrhea, unspecified type     Discharge Instructions      Stop Keflex for now.  Discontinue Imodium. Begin Pedialyte for about 12 to 18 hours until diarrhea stops, then switch to clear liquids (apple juice, clear grape juice, Jello, etc) for about 12 to 18 hours.  When improved, advance to a [employer] (Bananas, Rice, Applesauce, Toast). Then gradually resume a regular diet when tolerated.  Avoid milk products until well.    If symptoms become significantly worse during the  night or over the weekend, proceed to the local emergency room.     ED Prescriptions     Medication Sig Dispense Auth. Provider   cholestyramine (QUESTRAN) 4 g packet Take one pack PO BID 60 each Kandra Nicolas, MD         Kandra Nicolas, MD 10/29/22 1216

## 2022-10-28 NOTE — Telephone Encounter (Signed)
Reference # [id number] call by this RN to [facility] - no lab p/u today. Request put in for lab pick up in am - earliest driver available per [facility] - will be a routine p/u. Call to [facility] to update on delay in lab pick up - no other questions at this time

## 2022-10-28 NOTE — ED Notes (Signed)
Unable to provide a stool sample - will collect in am at home & bring sample back to [facility]

## 2022-10-28 NOTE — ED Triage Notes (Addendum)
Diarrhea since Sunday  4-8 times a day - liquid stools Yellow stool per pt  No relief w/ imodium Clear liquids  On maintenance antibiotic for bladder issues per pt  Nightly probiotic

## 2022-10-28 NOTE — Discharge Instructions (Addendum)
Stop Keflex for now.  Discontinue Imodium. Begin Pedialyte for about 12 to 18 hours until diarrhea stops, then switch to clear liquids (apple juice, clear grape juice, Jello, etc) for about 12 to 18 hours.  When improved, advance to a [employer] (Bananas, Rice, Applesauce, Toast). Then gradually resume a regular diet when tolerated.  Avoid milk products until well.    If symptoms become significantly worse during the night or over the weekend, proceed to the local emergency room.

## 2022-10-29 ENCOUNTER — Telehealth: Payer: Self-pay | Admitting: Emergency Medicine

## 2022-10-29 DIAGNOSIS — R197 Diarrhea, unspecified: Secondary | ICD-10-CM

## 2022-10-29 LAB — C DIFFICILE QUICK SCREEN W PCR REFLEX
C Diff antigen: NEGATIVE
C Diff interpretation: NOT DETECTED
C Diff toxin: NEGATIVE

## 2022-10-29 LAB — COMPLETE METABOLIC PANEL WITH GFR
AG Ratio: 1.8 (calc) (ref 1.0–2.5)
ALT: 25 U/L (ref 6–29)
AST: 21 U/L (ref 10–35)
Albumin: 4.2 g/dL (ref 3.6–5.1)
Alkaline phosphatase (APISO): 72 U/L (ref 37–153)
BUN: 15 mg/dL (ref 7–25)
CO2: 20 mmol/L (ref 20–32)
Calcium: 9.6 mg/dL (ref 8.6–10.4)
Chloride: 102 mmol/L (ref 98–110)
Creat: 0.77 mg/dL (ref 0.60–1.00)
Globulin: 2.3 g/dL (calc) (ref 1.9–3.7)
Glucose, Bld: 88 mg/dL (ref 65–99)
Potassium: 3.3 mmol/L — ABNORMAL LOW (ref 3.5–5.3)
Sodium: 137 mmol/L (ref 135–146)
Total Bilirubin: 0.5 mg/dL (ref 0.2–1.2)
Total Protein: 6.5 g/dL (ref 6.1–8.1)
eGFR: 80 mL/min/{1.73_m2} (ref >=60)

## 2022-10-29 LAB — GASTROINTESTINAL PANEL BY PCR, STOOL (REPLACES STOOL CULTURE)

## 2022-10-29 LAB — MAGNESIUM: Magnesium: 1.5 mg/dL (ref 1.5–2.5)

## 2022-10-29 NOTE — Telephone Encounter (Signed)
Pt dropped off stool sample - labs expired - labs reordered. Only GI panel collected w/ C-diff - Ova and Parasites not collected. Orders clarified w/ dr Assunta Found on 10/28/22

## 2022-10-31 ENCOUNTER — Encounter (HOSPITAL_COMMUNITY)
Admission: RE | Admit: 2022-10-31 | Discharge: 2022-10-31 | Disposition: A | Discharge status: Home / Self Care | Payer: Medicare Other | Source: Ambulatory Visit | Attending: General Surgery | Admitting: General Surgery

## 2022-10-31 ENCOUNTER — Other Ambulatory Visit (HOSPITAL_COMMUNITY): Payer: Medicare Other

## 2022-10-31 ENCOUNTER — Encounter (HOSPITAL_COMMUNITY): Payer: Self-pay

## 2022-10-31 VITALS — BP 131/73 | HR 73 | Temp 98.1°F | Resp 16 | Ht 65.5 in | Wt 270.0 lb

## 2022-10-31 DIAGNOSIS — Z01812 Encounter for preprocedural laboratory examination: Secondary | ICD-10-CM | POA: Insufficient documentation

## 2022-10-31 DIAGNOSIS — Z01818 Encounter for other preprocedural examination: Secondary | ICD-10-CM

## 2022-10-31 DIAGNOSIS — C181 Malignant neoplasm of appendix: Secondary | ICD-10-CM | POA: Insufficient documentation

## 2022-10-31 DIAGNOSIS — I10 Essential (primary) hypertension: Secondary | ICD-10-CM | POA: Insufficient documentation

## 2022-10-31 DIAGNOSIS — K6389 Other specified diseases of intestine: Secondary | ICD-10-CM | POA: Insufficient documentation

## 2022-10-31 LAB — CBC
HCT: 43.7 % (ref 36.0–46.0)
Hemoglobin: 13.7 g/dL (ref 12.0–15.0)
MCH: 27.1 pg (ref 26.0–34.0)
MCHC: 31.4 g/dL (ref 30.0–36.0)
MCV: 86.5 fL (ref 80.0–100.0)
Platelets: 221 10*3/uL (ref 150–400)
RBC: 5.05 MIL/uL (ref 3.87–5.11)
RDW: 15.9 % — ABNORMAL HIGH (ref 11.5–15.5)
WBC: 8.3 10*3/uL (ref 4.0–10.5)
nRBC: 0 % (ref 0.0–0.2)

## 2022-10-31 LAB — BASIC METABOLIC PANEL
Anion gap: 10 (ref 5–15)
BUN: 6 mg/dL — ABNORMAL LOW (ref 8–23)
CO2: 25 mmol/L (ref 22–32)
Calcium: 9.6 mg/dL (ref 8.9–10.3)
Chloride: 105 mmol/L (ref 98–111)
Creatinine, Ser: 0.68 mg/dL (ref 0.44–1.00)
GFR, Estimated: >60 mL/min (ref >60)
Glucose, Bld: 84 mg/dL (ref 70–99)
Potassium: 3.7 mmol/L (ref 3.5–5.1)
Sodium: 140 mmol/L (ref 135–145)

## 2022-11-01 ENCOUNTER — Inpatient Hospital Stay: Payer: Medicare Other | Admitting: Adult Health

## 2022-11-01 LAB — CEA: CEA: 4.1 ng/mL (ref 0.0–4.7)

## 2022-11-03 ENCOUNTER — Inpatient Hospital Stay (HOSPITAL_COMMUNITY): Payer: Medicare Other | Admitting: Physician Assistant

## 2022-11-03 ENCOUNTER — Other Ambulatory Visit: Payer: Self-pay

## 2022-11-03 ENCOUNTER — Inpatient Hospital Stay (HOSPITAL_COMMUNITY): Payer: Medicare Other | Admitting: Anesthesiology

## 2022-11-03 ENCOUNTER — Encounter (HOSPITAL_COMMUNITY)
Admission: RE | Disposition: A | Discharge status: Home Health | Payer: Self-pay | Source: Home / Self Care | Attending: General Surgery

## 2022-11-03 ENCOUNTER — Inpatient Hospital Stay (HOSPITAL_COMMUNITY)
Admission: RE | Admit: 2022-11-03 | Discharge: 2022-11-08 | DRG: 330 | Disposition: A | Discharge status: Home Health | Payer: Medicare Other | Attending: General Surgery | Admitting: General Surgery

## 2022-11-03 ENCOUNTER — Encounter (HOSPITAL_COMMUNITY): Payer: Self-pay | Admitting: General Surgery

## 2022-11-03 DIAGNOSIS — I10 Essential (primary) hypertension: Secondary | ICD-10-CM | POA: Diagnosis not present

## 2022-11-03 DIAGNOSIS — Z6841 Body Mass Index (BMI) 40.0 and over, adult: Secondary | ICD-10-CM

## 2022-11-03 DIAGNOSIS — Z808 Family history of malignant neoplasm of other organs or systems: Secondary | ICD-10-CM

## 2022-11-03 DIAGNOSIS — Z01812 Encounter for preprocedural laboratory examination: Secondary | ICD-10-CM | POA: Diagnosis not present

## 2022-11-03 DIAGNOSIS — Z905 Acquired absence of kidney: Secondary | ICD-10-CM

## 2022-11-03 DIAGNOSIS — Z9012 Acquired absence of left breast and nipple: Secondary | ICD-10-CM | POA: Diagnosis not present

## 2022-11-03 DIAGNOSIS — Z833 Family history of diabetes mellitus: Secondary | ICD-10-CM

## 2022-11-03 DIAGNOSIS — Z8719 Personal history of other diseases of the digestive system: Secondary | ICD-10-CM

## 2022-11-03 DIAGNOSIS — Z79811 Long term (current) use of aromatase inhibitors: Secondary | ICD-10-CM | POA: Diagnosis not present

## 2022-11-03 DIAGNOSIS — Z87891 Personal history of nicotine dependence: Secondary | ICD-10-CM

## 2022-11-03 DIAGNOSIS — D122 Benign neoplasm of ascending colon: Secondary | ICD-10-CM | POA: Diagnosis present

## 2022-11-03 DIAGNOSIS — D120 Benign neoplasm of cecum: Principal | ICD-10-CM | POA: Diagnosis present

## 2022-11-03 DIAGNOSIS — Z8249 Family history of ischemic heart disease and other diseases of the circulatory system: Secondary | ICD-10-CM

## 2022-11-03 DIAGNOSIS — K6389 Other specified diseases of intestine: Secondary | ICD-10-CM | POA: Diagnosis not present

## 2022-11-03 DIAGNOSIS — G4730 Sleep apnea, unspecified: Secondary | ICD-10-CM | POA: Diagnosis present

## 2022-11-03 DIAGNOSIS — C50412 Malignant neoplasm of upper-outer quadrant of left female breast: Secondary | ICD-10-CM | POA: Diagnosis present

## 2022-11-03 DIAGNOSIS — Z170 Estrogen receptor positive status [ER+]: Secondary | ICD-10-CM | POA: Diagnosis not present

## 2022-11-03 DIAGNOSIS — M1990 Unspecified osteoarthritis, unspecified site: Secondary | ICD-10-CM | POA: Diagnosis not present

## 2022-11-03 LAB — TYPE AND SCREEN
ABO/RH(D): A POS
Antibody Screen: NEGATIVE

## 2022-11-03 LAB — ABO/RH: ABO/RH(D): A POS

## 2022-11-03 SURGERY — COLECTOMY, PARTIAL, ROBOT-ASSISTED, LAPAROSCOPIC
Anesthesia: General

## 2022-11-03 MED ORDER — BUPIVACAINE LIPOSOME 1.3 % IJ SUSP
INTRAMUSCULAR | Status: AC
  Filled 2022-11-03: qty 20

## 2022-11-03 MED ORDER — GLYCOPYRROLATE PF 0.2 MG/ML IJ SOSY
PREFILLED_SYRINGE | INTRAMUSCULAR | Status: DC | PRN
  Administered 2022-11-03: .2 mg via INTRAVENOUS

## 2022-11-03 MED ORDER — SODIUM CHLORIDE 0.9 % IV SOLN
2.0000 g | INTRAVENOUS | Status: AC
  Administered 2022-11-03: 2 g via INTRAVENOUS
  Filled 2022-11-03: qty 2

## 2022-11-03 MED ORDER — ALVIMOPAN 12 MG PO CAPS
12.0000 mg | ORAL_CAPSULE | ORAL | Status: AC
  Administered 2022-11-03: 12 mg via ORAL
  Filled 2022-11-03: qty 1

## 2022-11-03 MED ORDER — HYDROMORPHONE HCL 1 MG/ML IJ SOLN
0.5000 mg | INTRAMUSCULAR | Status: DC | PRN
  Administered 2022-11-03 (×2): 0.5 mg via INTRAVENOUS
  Filled 2022-11-03 (×2): qty 0.5

## 2022-11-03 MED ORDER — PHENYLEPHRINE HCL-NACL 20-0.9 MG/250ML-% IV SOLN
INTRAVENOUS | Status: DC | PRN
  Administered 2022-11-03: 25 ug/min via INTRAVENOUS

## 2022-11-03 MED ORDER — CHLORHEXIDINE GLUCONATE 0.12 % MT SOLN
15.0000 mL | Freq: Once | OROMUCOSAL | Status: AC
  Administered 2022-11-03: 15 mL via OROMUCOSAL

## 2022-11-03 MED ORDER — GABAPENTIN 300 MG PO CAPS
300.0000 mg | ORAL_CAPSULE | ORAL | Status: AC
  Administered 2022-11-03: 300 mg via ORAL
  Filled 2022-11-03: qty 1

## 2022-11-03 MED ORDER — SODIUM CHLORIDE 0.9 % IV SOLN
2.0000 g | Freq: Two times a day (BID) | INTRAVENOUS | Status: AC
  Administered 2022-11-03: 2 g via INTRAVENOUS
  Filled 2022-11-03: qty 2

## 2022-11-03 MED ORDER — FENTANYL CITRATE PF 50 MCG/ML IJ SOSY
25.0000 ug | PREFILLED_SYRINGE | INTRAMUSCULAR | Status: DC | PRN
  Administered 2022-11-03 (×2): 50 ug via INTRAVENOUS

## 2022-11-03 MED ORDER — ORAL CARE MOUTH RINSE: 15.0000 mL | Freq: Once | OROMUCOSAL | Status: AC

## 2022-11-03 MED ORDER — FENTANYL CITRATE (PF) 100 MCG/2ML IJ SOLN
INTRAMUSCULAR | Status: AC
  Filled 2022-11-03: qty 2

## 2022-11-03 MED ORDER — ROCURONIUM BROMIDE 10 MG/ML (PF) SYRINGE
PREFILLED_SYRINGE | INTRAVENOUS | Status: DC | PRN
  Administered 2022-11-03: 100 mg via INTRAVENOUS
  Administered 2022-11-03: 10 mg via INTRAVENOUS

## 2022-11-03 MED ORDER — ONDANSETRON HCL 4 MG/2ML IJ SOLN
INTRAMUSCULAR | Status: AC
  Filled 2022-11-03: qty 2

## 2022-11-03 MED ORDER — LACTATED RINGERS IR SOLN
Status: DC | PRN
  Administered 2022-11-03: 1000 mL

## 2022-11-03 MED ORDER — LACTATED RINGERS IV SOLN: INTRAVENOUS | Status: DC

## 2022-11-03 MED ORDER — ALVIMOPAN 12 MG PO CAPS
12.0000 mg | ORAL_CAPSULE | Freq: Two times a day (BID) | ORAL | Status: DC
  Administered 2022-11-04: 12 mg via ORAL
  Filled 2022-11-03: qty 1

## 2022-11-03 MED ORDER — ENSURE PRE-SURGERY PO LIQD
296.0000 mL | Freq: Once | ORAL | Status: DC
  Filled 2022-11-03: qty 296

## 2022-11-03 MED ORDER — SUGAMMADEX SODIUM 500 MG/5ML IV SOLN
INTRAVENOUS | Status: AC
  Filled 2022-11-03: qty 5

## 2022-11-03 MED ORDER — LEVOTHYROXINE SODIUM 75 MCG PO TABS
150.0000 ug | ORAL_TABLET | Freq: Every day | ORAL | Status: DC
  Administered 2022-11-04 – 2022-11-08 (×5): 150 ug via ORAL
  Filled 2022-11-03 (×5): qty 2

## 2022-11-03 MED ORDER — LIDOCAINE HCL (PF) 2 % IJ SOLN
INTRAMUSCULAR | Status: AC
  Filled 2022-11-03: qty 5

## 2022-11-03 MED ORDER — ROCURONIUM BROMIDE 10 MG/ML (PF) SYRINGE
PREFILLED_SYRINGE | INTRAVENOUS | Status: AC
  Filled 2022-11-03: qty 10

## 2022-11-03 MED ORDER — ALBUTEROL SULFATE (2.5 MG/3ML) 0.083% IN NEBU: 2.5000 mg | INHALATION_SOLUTION | RESPIRATORY_TRACT | Status: DC | PRN

## 2022-11-03 MED ORDER — CHOLESTYRAMINE LIGHT 4 G PO PACK
4.0000 g | PACK | Freq: Two times a day (BID) | ORAL | Status: DC
  Administered 2022-11-05 – 2022-11-07 (×6): 4 g via ORAL
  Filled 2022-11-03 (×8): qty 1

## 2022-11-03 MED ORDER — BUPIVACAINE LIPOSOME 1.3 % IJ SUSP
INTRAMUSCULAR | Status: DC | PRN
  Administered 2022-11-03: 20 mL

## 2022-11-03 MED ORDER — BISACODYL 5 MG PO TBEC: 20.0000 mg | DELAYED_RELEASE_TABLET | Freq: Once | ORAL | Status: DC

## 2022-11-03 MED ORDER — ENOXAPARIN SODIUM 40 MG/0.4ML IJ SOSY
40.0000 mg | PREFILLED_SYRINGE | INTRAMUSCULAR | Status: DC
  Administered 2022-11-04 – 2022-11-08 (×5): 40 mg via SUBCUTANEOUS
  Filled 2022-11-03 (×5): qty 0.4

## 2022-11-03 MED ORDER — LACTATED RINGERS IV SOLN: INTRAVENOUS | Status: DC | PRN

## 2022-11-03 MED ORDER — PROPOFOL 500 MG/50ML IV EMUL
INTRAVENOUS | Status: AC
  Filled 2022-11-03: qty 50

## 2022-11-03 MED ORDER — FENTANYL CITRATE PF 50 MCG/ML IJ SOSY
PREFILLED_SYRINGE | INTRAMUSCULAR | Status: AC
  Administered 2022-11-03: 50 ug via INTRAVENOUS
  Filled 2022-11-03: qty 3

## 2022-11-03 MED ORDER — FENTANYL CITRATE (PF) 100 MCG/2ML IJ SOLN
INTRAMUSCULAR | Status: DC | PRN
  Administered 2022-11-03: 50 ug via INTRAVENOUS
  Administered 2022-11-03: 100 ug via INTRAVENOUS
  Administered 2022-11-03: 50 ug via INTRAVENOUS

## 2022-11-03 MED ORDER — STERILE WATER FOR IRRIGATION IR SOLN
Status: DC | PRN
  Administered 2022-11-03: 1000 mL

## 2022-11-03 MED ORDER — ONDANSETRON HCL 4 MG PO TABS: 4.0000 mg | ORAL_TABLET | Freq: Four times a day (QID) | ORAL | Status: DC | PRN

## 2022-11-03 MED ORDER — LIDOCAINE 2% (20 MG/ML) 5 ML SYRINGE
INTRAMUSCULAR | Status: DC | PRN
  Administered 2022-11-03: 80 mg via INTRAVENOUS

## 2022-11-03 MED ORDER — PHENYLEPHRINE 80 MCG/ML (10ML) SYRINGE FOR IV PUSH (FOR BLOOD PRESSURE SUPPORT)
PREFILLED_SYRINGE | INTRAVENOUS | Status: DC | PRN
  Administered 2022-11-03 (×3): 80 ug via INTRAVENOUS

## 2022-11-03 MED ORDER — GLYCOPYRROLATE 0.2 MG/ML IJ SOLN
INTRAMUSCULAR | Status: AC
  Filled 2022-11-03: qty 1

## 2022-11-03 MED ORDER — TRAMADOL HCL 50 MG PO TABS
50.0000 mg | ORAL_TABLET | Freq: Four times a day (QID) | ORAL | Status: DC | PRN
  Administered 2022-11-03 – 2022-11-04 (×2): 100 mg via ORAL
  Filled 2022-11-03: qty 2

## 2022-11-03 MED ORDER — LETROZOLE 2.5 MG PO TABS
2.5000 mg | ORAL_TABLET | Freq: Every evening | ORAL | Status: DC
  Administered 2022-11-03 – 2022-11-07 (×5): 2.5 mg via ORAL
  Filled 2022-11-03 (×5): qty 1

## 2022-11-03 MED ORDER — DEXAMETHASONE SODIUM PHOSPHATE 10 MG/ML IJ SOLN
INTRAMUSCULAR | Status: AC
  Filled 2022-11-03: qty 1

## 2022-11-03 MED ORDER — SACCHAROMYCES BOULARDII 250 MG PO CAPS
250.0000 mg | ORAL_CAPSULE | Freq: Two times a day (BID) | ORAL | Status: DC
  Administered 2022-11-03 – 2022-11-08 (×10): 250 mg via ORAL
  Filled 2022-11-03 (×10): qty 1

## 2022-11-03 MED ORDER — LOSARTAN POTASSIUM 50 MG PO TABS
50.0000 mg | ORAL_TABLET | Freq: Every day | ORAL | Status: DC
  Administered 2022-11-04 – 2022-11-08 (×5): 50 mg via ORAL
  Filled 2022-11-03 (×5): qty 1

## 2022-11-03 MED ORDER — SIMVASTATIN 20 MG PO TABS
10.0000 mg | ORAL_TABLET | Freq: Every day | ORAL | Status: DC
  Administered 2022-11-03 – 2022-11-07 (×5): 10 mg via ORAL
  Filled 2022-11-03 (×5): qty 1

## 2022-11-03 MED ORDER — TRAMADOL HCL 50 MG PO TABS
ORAL_TABLET | ORAL | Status: AC
  Filled 2022-11-03: qty 2

## 2022-11-03 MED ORDER — ONDANSETRON HCL 4 MG/2ML IJ SOLN: 4.0000 mg | Freq: Four times a day (QID) | INTRAMUSCULAR | Status: DC | PRN

## 2022-11-03 MED ORDER — GABAPENTIN 100 MG PO CAPS
300.0000 mg | ORAL_CAPSULE | Freq: Two times a day (BID) | ORAL | Status: DC
  Administered 2022-11-03 – 2022-11-08 (×10): 300 mg via ORAL
  Filled 2022-11-03 (×10): qty 3

## 2022-11-03 MED ORDER — ENSURE PRE-SURGERY PO LIQD
592.0000 mL | Freq: Once | ORAL | Status: DC
  Filled 2022-11-03: qty 592

## 2022-11-03 MED ORDER — KCL IN DEXTROSE-NACL 20-5-0.45 MEQ/L-%-% IV SOLN
INTRAVENOUS | Status: DC
  Filled 2022-11-03: qty 1000

## 2022-11-03 MED ORDER — DEXAMETHASONE SODIUM PHOSPHATE 10 MG/ML IJ SOLN
INTRAMUSCULAR | Status: DC | PRN
  Administered 2022-11-03: 10 mg via INTRAVENOUS

## 2022-11-03 MED ORDER — SUGAMMADEX SODIUM 500 MG/5ML IV SOLN
INTRAVENOUS | Status: DC | PRN
  Administered 2022-11-03: 490 mg via INTRAVENOUS

## 2022-11-03 MED ORDER — POLYETHYLENE GLYCOL 3350 17 GM/SCOOP PO POWD: 1.0000 | Freq: Once | ORAL | Status: DC

## 2022-11-03 MED ORDER — ENSURE SURGERY PO LIQD
237.0000 mL | Freq: Two times a day (BID) | ORAL | Status: DC
  Administered 2022-11-05 – 2022-11-08 (×6): 237 mL via ORAL

## 2022-11-03 MED ORDER — BUPIVACAINE LIPOSOME 1.3 % IJ SUSP: 20.0000 mL | Freq: Once | INTRAMUSCULAR | Status: DC

## 2022-11-03 MED ORDER — ALUM & MAG HYDROXIDE-SIMETH 200-200-20 MG/5ML PO SUSP: 30.0000 mL | Freq: Four times a day (QID) | ORAL | Status: DC | PRN

## 2022-11-03 MED ORDER — ONDANSETRON HCL 4 MG/2ML IJ SOLN
INTRAMUSCULAR | Status: DC | PRN
  Administered 2022-11-03: 4 mg via INTRAVENOUS

## 2022-11-03 MED ORDER — PROPOFOL 10 MG/ML IV BOLUS
INTRAVENOUS | Status: DC | PRN
  Administered 2022-11-03: 120 mg via INTRAVENOUS

## 2022-11-03 MED ORDER — ACETAMINOPHEN 500 MG PO TABS
1000.0000 mg | ORAL_TABLET | ORAL | Status: AC
  Administered 2022-11-03: 1000 mg via ORAL
  Filled 2022-11-03: qty 2

## 2022-11-03 MED ORDER — BUPIVACAINE-EPINEPHRINE (PF) 0.25% -1:200000 IJ SOLN
INTRAMUSCULAR | Status: AC
  Filled 2022-11-03: qty 30

## 2022-11-03 MED ORDER — 0.9 % SODIUM CHLORIDE (POUR BTL) OPTIME
TOPICAL | Status: DC | PRN
  Administered 2022-11-03: 2000 mL

## 2022-11-03 MED ORDER — BUPIVACAINE-EPINEPHRINE 0.25% -1:200000 IJ SOLN
INTRAMUSCULAR | Status: DC | PRN
  Administered 2022-11-03: 30 mL

## 2022-11-03 SURGICAL SUPPLY — 97 items
BAG COUNTER SPONGE SURGICOUNT (BAG) ×1 IMPLANT
BLADE EXTENDED COATED 6.5IN (ELECTRODE) IMPLANT
CANNULA REDUC XI 12-8 STAPL (CANNULA)
CANNULA REDUCER 12-8 DVNC XI (CANNULA) IMPLANT
CELLS DAT CNTRL 66122 CELL SVR (MISCELLANEOUS) ×1 IMPLANT
COVER SURGICAL LIGHT HANDLE (MISCELLANEOUS) ×2 IMPLANT
COVER TIP SHEARS 8 DVNC (MISCELLANEOUS) ×1 IMPLANT
COVER TIP SHEARS 8MM DA VINCI (MISCELLANEOUS) ×1
DRAIN CHANNEL 19F RND (DRAIN) IMPLANT
DRAPE ARM DVNC X/XI (DISPOSABLE) ×4 IMPLANT
DRAPE COLUMN DVNC XI (DISPOSABLE) ×1 IMPLANT
DRAPE DA VINCI XI ARM (DISPOSABLE) ×4
DRAPE DA VINCI XI COLUMN (DISPOSABLE) ×1
DRAPE SURG IRRIG POUCH 19X23 (DRAPES) ×1 IMPLANT
DRSG OPSITE POSTOP 4X10 (GAUZE/BANDAGES/DRESSINGS) IMPLANT
DRSG OPSITE POSTOP 4X6 (GAUZE/BANDAGES/DRESSINGS) IMPLANT
DRSG OPSITE POSTOP 4X8 (GAUZE/BANDAGES/DRESSINGS) IMPLANT
ELECT PENCIL ROCKER SW 15FT (MISCELLANEOUS) ×1 IMPLANT
ELECT REM PT RETURN 15FT ADLT (MISCELLANEOUS) ×1 IMPLANT
ENDOLOOP SUT PDS II  0 18 (SUTURE)
ENDOLOOP SUT PDS II 0 18 (SUTURE) IMPLANT
EVACUATOR SILICONE 100CC (DRAIN) IMPLANT
GLOVE BIO SURGEON STRL SZ 6.5 (GLOVE) ×3 IMPLANT
GLOVE BIOGEL PI IND STRL 7.0 (GLOVE) ×2 IMPLANT
GLOVE INDICATOR 6.5 STRL GRN (GLOVE) ×1 IMPLANT
GOWN SRG XL LVL 4 BRTHBL STRL (GOWNS) ×1 IMPLANT
GOWN STRL NON-REIN XL LVL4 (GOWNS) ×1
GOWN STRL REUS W/ TWL XL LVL3 (GOWN DISPOSABLE) ×3 IMPLANT
GOWN STRL REUS W/TWL XL LVL3 (GOWN DISPOSABLE) ×3
GRASPER SUT TROCAR 14GX15 (MISCELLANEOUS) IMPLANT
HOLDER FOLEY CATH W/STRAP (MISCELLANEOUS) ×1 IMPLANT
IRRIG SUCT STRYKERFLOW 2 WTIP (MISCELLANEOUS) ×1
IRRIGATION SUCT STRKRFLW 2 WTP (MISCELLANEOUS) ×1 IMPLANT
KIT PROCEDURE DA VINCI SI (MISCELLANEOUS)
KIT PROCEDURE DVNC SI (MISCELLANEOUS) IMPLANT
KIT TURNOVER KIT A (KITS) IMPLANT
NDL INSUFFLATION 14GA 120MM (NEEDLE) ×1 IMPLANT
NEEDLE INSUFFLATION 14GA 120MM (NEEDLE) ×1 IMPLANT
PACK CARDIOVASCULAR III (CUSTOM PROCEDURE TRAY) ×1 IMPLANT
PACK COLON (CUSTOM PROCEDURE TRAY) ×1 IMPLANT
PAD POSITIONING PINK XL (MISCELLANEOUS) ×1 IMPLANT
RELOAD STAPLE 60 2.5 WHT DVNC (STAPLE) IMPLANT
RELOAD STAPLE 60 3.5 BLU DVNC (STAPLE) IMPLANT
RELOAD STAPLE 60 4.3 GRN DVNC (STAPLE) IMPLANT
RELOAD STAPLER 2.5X60 WHT DVNC (STAPLE) ×1 IMPLANT
RELOAD STAPLER 3.5X60 BLU DVNC (STAPLE) ×1 IMPLANT
RELOAD STAPLER 4.3X60 GRN DVNC (STAPLE) ×2 IMPLANT
RETRACTOR WND ALEXIS 18 MED (MISCELLANEOUS) IMPLANT
RTRCTR WOUND ALEXIS 18CM MED (MISCELLANEOUS) ×1
SCISSORS LAP 5X35 DISP (ENDOMECHANICALS) IMPLANT
SEAL CANN UNIV 5-8 DVNC XI (MISCELLANEOUS) ×3 IMPLANT
SEAL XI 5MM-8MM UNIVERSAL (MISCELLANEOUS) ×3
SEALER VESSEL DA VINCI XI (MISCELLANEOUS) ×1
SEALER VESSEL EXT DVNC XI (MISCELLANEOUS) ×1 IMPLANT
SOLUTION ELECTROLUBE (MISCELLANEOUS) ×1 IMPLANT
SPIKE FLUID TRANSFER (MISCELLANEOUS) IMPLANT
STAPLER 60 DA VINCI SURE FORM (STAPLE)
STAPLER 60 SUREFORM DVNC (STAPLE) IMPLANT
STAPLER CANNULA SEAL DVNC XI (STAPLE) IMPLANT
STAPLER CANNULA SEAL XI (STAPLE)
STAPLER ECHELON POWER CIR 29 (STAPLE) IMPLANT
STAPLER ECHELON POWER CIR 31 (STAPLE) IMPLANT
STAPLER RELOAD 2.5X60 WHITE (STAPLE) ×1
STAPLER RELOAD 2.5X60 WHT DVNC (STAPLE) ×1
STAPLER RELOAD 3.5X60 BLU DVNC (STAPLE) ×1
STAPLER RELOAD 3.5X60 BLUE (STAPLE) ×1
STAPLER RELOAD 4.3X60 GREEN (STAPLE) ×2
STAPLER RELOAD 4.3X60 GRN DVNC (STAPLE) ×2
STOPCOCK 4 WAY LG BORE MALE ST (IV SETS) ×2 IMPLANT
SUT ETHILON 2 0 PS N (SUTURE) IMPLANT
SUT NOVA NAB GS-21 1 T12 (SUTURE) ×2 IMPLANT
SUT PROLENE 2 0 KS (SUTURE) IMPLANT
SUT SILK 2 0 (SUTURE) ×1
SUT SILK 2 0 SH CR/8 (SUTURE) IMPLANT
SUT SILK 2-0 18XBRD TIE 12 (SUTURE) ×1 IMPLANT
SUT SILK 3 0 (SUTURE)
SUT SILK 3 0 SH CR/8 (SUTURE) ×1 IMPLANT
SUT SILK 3-0 18XBRD TIE 12 (SUTURE) IMPLANT
SUT V-LOC BARB 180 2/0GR6 GS22 (SUTURE) ×3
SUT VIC AB 2-0 SH 18 (SUTURE) IMPLANT
SUT VIC AB 2-0 SH 27 (SUTURE)
SUT VIC AB 2-0 SH 27X BRD (SUTURE) IMPLANT
SUT VIC AB 3-0 SH 18 (SUTURE) IMPLANT
SUT VIC AB 4-0 PS2 27 (SUTURE) ×2 IMPLANT
SUT VICRYL 0 UR6 27IN ABS (SUTURE) ×1 IMPLANT
SUTURE V-LC BRB 180 2/0GR6GS22 (SUTURE) IMPLANT
SYR 10ML ECCENTRIC (SYRINGE) ×1 IMPLANT
SYS LAPSCP GELPORT 120MM (MISCELLANEOUS)
SYS WOUND ALEXIS 18CM MED (MISCELLANEOUS)
SYSTEM LAPSCP GELPORT 120MM (MISCELLANEOUS) IMPLANT
SYSTEM WOUND ALEXIS 18CM MED (MISCELLANEOUS) IMPLANT
TOWEL OR 17X26 10 PK STRL BLUE (TOWEL DISPOSABLE) IMPLANT
TOWEL OR NON WOVEN STRL DISP B (DISPOSABLE) ×1 IMPLANT
TRAY FOLEY MTR SLVR 16FR STAT (SET/KITS/TRAYS/PACK) ×1 IMPLANT
TROCAR ADV FIXATION 5X100MM (TROCAR) ×1 IMPLANT
TUBING CONNECTING 10 (TUBING) ×2 IMPLANT
TUBING INSUFFLATION 10FT LAP (TUBING) ×1 IMPLANT

## 2022-11-03 NOTE — Op Note (Signed)
11/03/2022  10:16 AM  PATIENT:  Brooke Duncan  76 y.o. female  Patient Care Team: Lawerance Cruel, MD as PCP - General (Family Medicine) Mauro Kaufmann, RN as Oncology Nurse Navigator Rockwell Germany, RN as Oncology Nurse Navigator Rolm Bookbinder, MD as Consulting Physician (General Surgery) Nicholas Lose, MD as Consulting Physician (Hematology and Oncology) Gery Pray, MD as Consulting Physician (Radiation Oncology)  PRE-OPERATIVE DIAGNOSIS:  CECAL MASS  POST-OPERATIVE DIAGNOSIS:  CECAL MASS  PROCEDURE:  XI ROBOT ASSISTED RIGHT COLECTOMY   Surgeon(s): Leighton Ruff, MD  ASSISTANT: none   ANESTHESIA:   local and general  EBL: 66m Total I/O In: 1200 [I.V.:1200] Out: 750 [Urine:700; Blood:50]  Delay start of Pharmacological VTE agent (>24hrs) due to surgical blood loss or risk of bleeding:  no  DRAINS: none   SPECIMEN:  Source of Specimen:  R colon  DISPOSITION OF SPECIMEN:  PATHOLOGY  COUNTS:  YES  PLAN OF CARE: Admit to inpatient   PATIENT DISPOSITION:  PACU - hemodynamically stable.  INDICATION:    76y.o. F with appendiceal orifice mass.  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  I recommended segmental resection:  The anatomy & physiology of the digestive tract was discussed.  The pathophysiology was discussed.  Natural history risks without surgery was discussed.   I worked to give an overview of the disease and the frequent need to have multispecialty involvement.  I feel the risks of no intervention will lead to serious problems that outweigh the operative risks; therefore, I recommended a partial colectomy to remove the pathology.  Laparoscopic & open techniques were discussed.   Risks such as bleeding, infection, abscess, leak, reoperation, possible ostomy, hernia, heart attack, death, and other risks were discussed.  I noted a good likelihood this will help address the problem.   Goals of post-operative recovery were discussed as well.    The  patient expressed understanding & wished to proceed with surgery.  OR FINDINGS:   Patient had mass noted in her cecum at the appendiceal orifice  No obvious metastatic disease on visceral parietal peritoneum or liver.  DESCRIPTION:   Informed consent was confirmed.  The patient underwent general anaesthesia without difficulty.  The patient was positioned appropriately.  VTE prevention in place.  The patient's abdomen was clipped, prepped, & draped in a sterile fashion.  Surgical timeout confirmed our plan.  The patient was positioned in reverse Trendelenburg.  Abdominal entry was gained using a Varies needle in the LUQ.  Entry was clean.  I induced carbon dioxide insufflation.  An 830mrobotic port was placed in the LUQ.  Camera inspection revealed no injury.  Extra ports were carefully placed under direct laparoscopic visualization.  I laparoscopically reflected the greater omentum and the upper abdomen the small bowel in the peilvis. The patient was appropriately positioned and the robot was docked to the patient's right side.  Instruments were placed under direct visualization.    I began by identifying the ileocolic artery and vein within the mesentery. Dissection was bluntly carried around these structures. The duodenum was identified and free from the structures. I then separated the structures bluntly and used the robotic vessel sealer device to transect these.  I developed the retroperitoneal plane bluntly.  I then freed the appendix off its attachments to the pelvic wall. I mobilized the terminal ileum.  I took care to avoid injuring any retroperitoneal structures.  After this I began to mobilize laterally down the white line of [place] and  then took down the hepatic flexure using the Enseal device. I mobilized the omentum off of the right transverse colon. The entire colon was then flipped medially and mobilized off of the retroperitoneal structures until I could visualize the lateral edge of  the duodenum underneath.  I gently freed the duodenal attachments.   I identified a portion of mesentery of the transverse colon just proximal to the right branch of the middle colic.  I divided up to the colon from the previous dissection of the mesentery using the robotic vessel sealer.  I then divided the terminal ileal mesentery in similar fashion.  At that point, the terminal ileum was divided with a blue load robotic 60 mm stapler.  The transverse colon was divided with 2 green loads of the robotic stapler.  The specimen was then completely free and placed in the right upper quadrant.  Hemostasis was good.  I then oriented the remaining terminal ileum and transverse colon and a isoperistaltic fashion.  I placed an enterotomy in the small bowel and colon using the robotic scissors.  I then introduced a white load 60 mm robotic stapler into both enterotomies and created an anastomosis between the small bowel and transverse colon.  Hemostasis within the staple line was good.  The common enterotomy channel was closed using 2 running 2-0 V-Loc sutures.  The abdomen was then irrigated with normal saline.  Hemostasis was good.  The omentum was then brought down over the anastomosis.  At this point the robot was undocked.  The 12 mm suprapubic port was enlarged to a Pfannenstiel incision and an [place] wound protector was placed.  The specimen was removed from the abdomen and evaluated.  Once the abdomen was inspected for hemostasis, the [place] wound protector was removed.    The peritoneum of the Pfannenstiel incision was closed using a running 0 Vicryl suture.  The fascia was then closed using 2 #1 Novafil running sutures.  The subcutaneous tissue of the extraction incision was closed using a running 2-0 Vicryl suture. The skin was then closed using running subcuticular 4-0 Vicryl sutures.  A sterile dressing was applied.  The remaining port sites were closed using interrupted 4-0 Vicryl sutures and covered with  a sterile dressing. All counts were correct per operating room staff. The patient was then awakened from anesthesia and sent to the post anesthesia care unit in stable condition.     Rosario Adie, MD  Colorectal and [facility]

## 2022-11-03 NOTE — Anesthesia Procedure Notes (Signed)
Procedure Name: Intubation Date/Time: 11/03/2022 8:03 AM  Performed by: Lavina Hamman, CRNAPre-anesthesia Checklist: Patient identified, Emergency Drugs available, Suction available, Patient being monitored and Timeout performed Patient Re-evaluated:Patient Re-evaluated prior to induction Oxygen Delivery Method: Circle system utilized Preoxygenation: Pre-oxygenation with 100% oxygen Induction Type: IV induction Ventilation: Mask ventilation without difficulty Laryngoscope Size: Mac and 3 Grade View: Grade II Tube type: Oral Tube size: 7.0 mm Number of attempts: 1 Airway Equipment and Method: Stylet Placement Confirmation: ETT inserted through vocal cords under direct vision, positive ETCO2, CO2 detector and breath sounds checked- equal and bilateral Secured at: 21 cm Tube secured with: Tape Dental Injury: Teeth and Oropharynx as per pre-operative assessment  Comments: ATOI

## 2022-11-03 NOTE — Interval H&P Note (Signed)
History and Physical Interval Note:  11/03/2022 7:10 AM  Brooke Duncan  has presented today for surgery, with the diagnosis of CECAL MASS.  The various methods of treatment have been discussed with the patient and family. After consideration of risks, benefits and other options for treatment, the patient has consented to  Procedure(s): XI ROBOT ASSISTED LAPAROSCOPIC PARTIAL COLECTOMY (N/A) as a surgical intervention.  The patient's history has been reviewed, patient examined, no change in status, stable for surgery.  I have reviewed the patient's chart and labs.  Questions were answered to the patient's satisfaction.     Rosario Adie, MD  Col[facility] and [facility]

## 2022-11-03 NOTE — Transfer of Care (Signed)
Immediate Anesthesia Transfer of Care Note  Patient: Brooke Duncan  Procedure(s) Performed: Procedure(s): XI ROBOT ASSISTED LAPAROSCOPIC PARTIAL COLECTOMY (N/A)  Patient Location: PACU  Anesthesia Type:General  Level of Consciousness:  sedated, patient cooperative and responds to stimulation  Airway & Oxygen Therapy:Patient Spontanous Breathing and Patient connected to face mask oxgen  Post-op Assessment:  Report given to PACU RN and Post -op Vital signs reviewed and stable  Post vital signs:  Reviewed and stable  Last Vitals:  Vitals:   11/03/22 0545  BP: (!) 160/77  Pulse: 92  Resp: 16  Temp: 36.6 C  SpO2: 97%    Complications: No apparent anesthesia complications

## 2022-11-03 NOTE — Anesthesia Preprocedure Evaluation (Addendum)
Anesthesia Evaluation  Patient identified by MRN, date of birth, ID band Patient awake    Reviewed: Allergy & Precautions, NPO status , Patient's Chart, lab work & pertinent test results  Airway Mallampati: II  TM Distance: >3 FB Neck ROM: Full    Dental no notable dental hx. (+) Teeth Intact, Dental Advisory Given   Pulmonary sleep apnea and Continuous Positive Airway Pressure Ventilation , former smoker   Pulmonary exam normal breath sounds clear to auscultation       Cardiovascular hypertension, Pt. on medications Normal cardiovascular exam Rhythm:Regular Rate:Normal     Neuro/Psych negative neurological ROS  negative psych ROS   GI/Hepatic negative GI ROS, Neg liver ROS,,,  Endo/Other  Hypothyroidism  Morbid obesity (BMI 44)  Renal/GU negative Renal ROS  negative genitourinary   Musculoskeletal  (+) Arthritis ,    Abdominal   Peds  Hematology negative hematology ROS (+)   Anesthesia Other Findings   Reproductive/Obstetrics                             Anesthesia Physical Anesthesia Plan  ASA: 3  Anesthesia Plan: General   Post-op Pain Management: Tylenol PO (pre-op)* and Ketamine IV*   Induction: Intravenous  PONV Risk Score and Plan: 3 and Dexamethasone, Ondansetron and Treatment may vary due to age or medical condition  Airway Management Planned: Oral ETT  Additional Equipment:   Intra-op Plan:   Post-operative Plan: Extubation in OR  Informed Consent: I have reviewed the patients History and Physical, chart, labs and discussed the procedure including the risks, benefits and alternatives for the proposed anesthesia with the patient or authorized representative who has indicated his/her understanding and acceptance.     Dental advisory given  Plan Discussed with: CRNA  Anesthesia Plan Comments:        Anesthesia Quick Evaluation

## 2022-11-04 LAB — BASIC METABOLIC PANEL
Anion gap: 7 (ref 5–15)
BUN: 6 mg/dL — ABNORMAL LOW (ref 8–23)
CO2: 26 mmol/L (ref 22–32)
Calcium: 8.7 mg/dL — ABNORMAL LOW (ref 8.9–10.3)
Chloride: 104 mmol/L (ref 98–111)
Creatinine, Ser: 0.78 mg/dL (ref 0.44–1.00)
GFR, Estimated: >60 mL/min (ref >60)
Glucose, Bld: 128 mg/dL — ABNORMAL HIGH (ref 70–99)
Potassium: 3.7 mmol/L (ref 3.5–5.1)
Sodium: 137 mmol/L (ref 135–145)

## 2022-11-04 LAB — CBC
HCT: 38.9 % (ref 36.0–46.0)
Hemoglobin: 12.1 g/dL (ref 12.0–15.0)
MCH: 27.4 pg (ref 26.0–34.0)
MCHC: 31.1 g/dL (ref 30.0–36.0)
MCV: 88 fL (ref 80.0–100.0)
Platelets: 224 10*3/uL (ref 150–400)
RBC: 4.42 MIL/uL (ref 3.87–5.11)
RDW: 15.8 % — ABNORMAL HIGH (ref 11.5–15.5)
WBC: 15.7 10*3/uL — ABNORMAL HIGH (ref 4.0–10.5)
nRBC: 0 % (ref 0.0–0.2)

## 2022-11-04 MED ORDER — HYDROCODONE-ACETAMINOPHEN 5-325 MG PO TABS
1.0000 | ORAL_TABLET | Freq: Four times a day (QID) | ORAL | Status: DC | PRN
  Administered 2022-11-04 – 2022-11-06 (×3): 2 via ORAL
  Filled 2022-11-04 (×4): qty 2

## 2022-11-04 NOTE — Discharge Instructions (Signed)
SURGERY: POST OP INSTRUCTIONS (Surgery for small bowel obstruction, colon resection, etc)   ######################################################################  EAT Gradually transition to a high fiber diet with a fiber supplement over the next few days after discharge  WALK Walk an hour a day.  Control your pain to do that.    CONTROL PAIN Control pain so that you can walk, sleep, tolerate sneezing/coughing, go up/down stairs.  HAVE A BOWEL MOVEMENT DAILY Keep your bowels regular to avoid problems.  OK to try a laxative to override constipation.  OK to use an antidairrheal to slow down diarrhea.  Call if not better after 2 tries  CALL IF YOU HAVE PROBLEMS/CONCERNS Call if you are still struggling despite following these instructions. Call if you have concerns not answered by these instructions  ######################################################################   DIET Follow a light diet the first few days at home.  Start with a bland diet such as soups, liquids, starchy foods, low fat foods, etc.  If you feel full, bloated, or constipated, stay on a ful liquid or pureed/blenderized diet for a few days until you feel better and no longer constipated. Be sure to drink plenty of fluids every day to avoid getting dehydrated (feeling dizzy, not urinating, etc.). Gradually add a fiber supplement to your diet over the next week.  Gradually get back to a regular solid diet.  Avoid fast food or heavy meals the first week as you are more likely to get nauseated. It is expected for your digestive tract to need a few months to get back to normal.  It is common for your bowel movements and stools to be irregular.  You will have occasional bloating and cramping that should eventually fade away.  Until you are eating solid food normally, off all pain medications, and back to regular activities; your bowels will not be normal. Focus on eating a low-fat, high fiber diet the rest of your life  (See Getting to Good Bowel Health, below).  CARE of your INCISION or WOUND  It is good for closed incisions and even open wounds to be washed every day.  Shower every day.  Short baths are fine.  Wash the incisions and wounds clean with soap & water.    You may leave closed incisions open to air if it is dry.   You may cover the incision with clean gauze & replace it after your daily shower for comfort.  STAPLES: You have skin staples.  Leave them in place & set up an appointment for them to be removed by a surgery office nurse ~10 days after surgery. = 1st week of January 2024    ACTIVITIES as tolerated Start light daily activities --- self-care, walking, climbing stairs-- beginning the day after surgery.  Gradually increase activities as tolerated.  Control your pain to be active.  Stop when you are tired.  Ideally, walk several times a day, eventually an hour a day.   Most people are back to most day-to-day activities in a few weeks.  It takes 4-8 weeks to get back to unrestricted, intense activity. If you can walk 30 minutes without difficulty, it is safe to try more intense activity such as jogging, treadmill, bicycling, low-impact aerobics, swimming, etc. Save the most intensive and strenuous activity for last (Usually 4-8 weeks after surgery) such as sit-ups, heavy lifting, contact sports, etc.  Refrain from any intense heavy lifting or straining until you are off narcotics for pain control.  You will have off days, but things should improve   week-by-week. DO NOT PUSH THROUGH PAIN.  Let pain be your guide: If it hurts to do something, don't do it.  Pain is your body warning you to avoid that activity for another week until the pain goes down. You may drive when you are no longer taking narcotic prescription pain medication, you can comfortably wear a seatbelt, and you can safely make sudden turns/stops to protect yourself without hesitating due to pain. You may have sexual intercourse when it  is comfortable. If it hurts to do something, stop.  MEDICATIONS Take your usually prescribed home medications unless otherwise directed.   Blood thinners:  Usually you can restart any strong blood thinners after the second postoperative day.  It is OK to take aspirin right away.     If you are on strong blood thinners (warfarin/Coumadin, Plavix, Xerelto, Eliquis, Pradaxa, etc), discuss with your surgeon, medicine PCP, and/or cardiologist for instructions on when to restart the blood thinner & if blood monitoring is needed (PT/INR blood check, etc).     PAIN CONTROL Pain after surgery or related to activity is often due to strain/injury to muscle, tendon, nerves and/or incisions.  This pain is usually short-term and will improve in a few months.  To help speed the process of healing and to get back to regular activity more quickly, DO THE FOLLOWING THINGS TOGETHER: Increase activity gradually.  DO NOT PUSH THROUGH PAIN Use Ice and/or Heat Try Gentle Massage and/or Stretching Take over the counter pain medication Take Narcotic prescription pain medication for more severe pain  Good pain control = faster recovery.  It is better to take more medicine to be more active than to stay in bed all day to avoid medications.  Increase activity gradually Avoid heavy lifting at first, then increase to lifting as tolerated over the next 6 weeks. Do not "push through" the pain.  Listen to your body and avoid positions and maneuvers than reproduce the pain.  Wait a few days before trying something more intense Walking an hour a day is encouraged to help your body recover faster and more safely.  Start slowly and stop when getting sore.  If you can walk 30 minutes without stopping or pain, you can try more intense activity (running, jogging, aerobics, cycling, swimming, treadmill, sex, sports, weightlifting, etc.) Remember: If it hurts to do it, then don't do it! Use Ice and/or Heat You will have swelling and  bruising around the incisions.  This will take several weeks to resolve. Ice packs or heating pads (6-8 times a day, 30-60 minutes at a time) will help sooth soreness & bruising. Some people prefer to use ice alone, heat alone, or alternate between ice & heat.  Experiment and see what works best for you.  Consider trying ice for the first few days to help decrease swelling and bruising; then, switch to heat to help relax sore spots and speed recovery. Shower every day.  Short baths are fine.  It feels good!  Keep the incisions and wounds clean with soap & water.   Try Gentle Massage and/or Stretching Massage at the area of pain many times a day Stop if you feel pain - do not overdo it Take over the counter pain medication This helps the muscle and nerve tissues become less irritable and calm down faster Choose ONE of the following over-the-counter anti-inflammatory medications: Acetaminophen 500mg tabs (Tylenol) 1-2 pills with every meal and just before bedtime (avoid if you have liver problems or if you have   acetaminophen in you narcotic prescription) Naproxen 220mg tabs (ex. Aleve, Naprosyn) 1-2 pills twice a day (avoid if you have kidney, stomach, IBD, or bleeding problems) Ibuprofen 200mg tabs (ex. Advil, Motrin) 3-4 pills with every meal and just before bedtime (avoid if you have kidney, stomach, IBD, or bleeding problems) Take with food/snack several times a day as directed for at least 2 weeks to help keep pain / soreness down & more manageable. Take Narcotic prescription pain medication for more severe pain A prescription for strong pain control is often given to you upon discharge (for example: oxycodone/Percocet, hydrocodone/Norco/Vicodin, or tramadol/Ultram) Take your pain medication as prescribed. Be mindful that most narcotic prescriptions contain Tylenol (acetaminophen) as well - avoid taking too much Tylenol. If you are having problems/concerns with the prescription medicine (does  not control pain, nausea, vomiting, rash, itching, etc.), please call us [phone] to see if we need to switch you to a different pain medicine that will work better for you and/or control your side effects better. If you need a refill on your pain medication, you must call the office before 4 pm and on weekdays only.  By federal law, prescriptions for narcotics cannot be called into a pharmacy.  They must be filled out on paper & picked up from our office by the patient or authorized caretaker.  Prescriptions cannot be filled after 4 pm nor on weekends.    WHEN TO CALL US [phone] Severe uncontrolled or worsening pain  Fever over 101 F (38.5 C) Concerns with the incision: Worsening pain, redness, rash/hives, swelling, bleeding, or drainage Reactions / problems with new medications (itching, rash, hives, nausea, etc.) Nausea and/or vomiting Difficulty urinating Difficulty breathing Worsening fatigue, dizziness, lightheadedness, blurred vision Other concerns If you are not getting better after two weeks or are noticing you are getting worse, contact our office [phone] for further advice.  We may need to adjust your medications, re-evaluate you in the office, send you to the emergency room, or see what other things we can do to help. The clinic staff is available to answer your questions during regular business hours (8:30am-5pm).  Please don't hesitate to call and ask to speak to one of our nurses for clinical concerns.    A surgeon from [facility] is always on call at the hospitals 24 hours/day If you have a medical emergency, go to the nearest emergency room or call 911.  FOLLOW UP in our office One the day of your discharge from the hospital (or the next business weekday), please call [facility] to set up or confirm an appointment to see your surgeon in the office for a follow-up appointment.  Usually it is 2-3 weeks after your surgery.   If you  have skin staples at your incision(s), let the office know so we can set up a time in the office for the nurse to remove them (usually around 10 days after surgery). Make sure that you call for appointments the day of discharge (or the next business weekday) from the hospital to ensure a convenient appointment time. IF YOU HAVE DISABILITY OR FAMILY LEAVE FORMS, BRING THEM TO THE OFFICE FOR PROCESSING.  DO NOT GIVE THEM TO YOUR DOCTOR.  [facility] [address], [address], [place], [place]  [address] ? [phone] - Main [phone] - Toll Free,  [phone] - Fax [email or web address]    GETTING TO GOOD BOWEL HEALTH. It is expected for your digestive tract to   need a few months to get back to normal.  It is common for your bowel movements and stools to be irregular.  You will have occasional bloating and cramping that should eventually fade away.  Until you are eating solid food normally, off all pain medications, and back to regular activities; your bowels will not be normal.   Avoiding constipation The goal: ONE SOFT BOWEL MOVEMENT A DAY!    Drink plenty of fluids.  Choose water first. TAKE A FIBER SUPPLEMENT EVERY DAY THE REST OF YOUR LIFE During your first week back home, gradually add back a fiber supplement every day Experiment which form you can tolerate.   There are many forms such as powders, tablets, wafers, gummies, etc Psyllium bran (Metamucil), methylcellulose (Citrucel), Miralax or Glycolax, Benefiber, Flax Seed.  Adjust the dose week-by-week (1/2 dose/day to 6 doses a day) until you are moving your bowels 1-2 times a day.  Cut back the dose or try a different fiber product if it is giving you problems such as diarrhea or bloating. Sometimes a laxative is needed to help jump-start bowels if constipated until the fiber supplement can help regulate your bowels.  If you are tolerating eating & you are farting, it is okay to try a gentle  laxative such as double dose MiraLax, prune juice, or Milk of Magnesia.  Avoid using laxatives too often. Stool softeners can sometimes help counteract the constipating effects of narcotic pain medicines.  It can also cause diarrhea, so avoid using for too long. If you are still constipated despite taking fiber daily, eating solids, and a few doses of laxatives, call our office. Controlling diarrhea Try drinking liquids and eating bland foods for a few days to avoid stressing your intestines further. Avoid dairy products (especially milk & ice cream) for a short time.  The intestines often can lose the ability to digest lactose when stressed. Avoid foods that cause gassiness or bloating.  Typical foods include beans and other legumes, cabbage, broccoli, and dairy foods.  Avoid greasy, spicy, fast foods.  Every person has some sensitivity to other foods, so listen to your body and avoid those foods that trigger problems for you. Probiotics (such as active yogurt, Align, etc) may help repopulate the intestines and colon with normal bacteria and calm down a sensitive digestive tract Adding a fiber supplement gradually can help thicken stools by absorbing excess fluid and retrain the intestines to act more normally.  Slowly increase the dose over a few weeks.  Too much fiber too soon can backfire and cause cramping & bloating. It is okay to try and slow down diarrhea with a few doses of antidiarrheal medicines.   Bismuth subsalicylate (ex. Kayopectate, Pepto Bismol) for a few doses can help control diarrhea.  Avoid if pregnant.   Loperamide (Imodium) can slow down diarrhea.  Start with one tablet (2mg) first.  Avoid if you are having fevers or severe pain.  ILEOSTOMY PATIENTS WILL HAVE CHRONIC DIARRHEA since their colon is not in use.    Drink plenty of liquids.  You will need to drink even more glasses of water/liquid a day to avoid getting dehydrated. Record output from your ileostomy.  Expect to empty  the bag every 3-4 hours at first.  Most people with a permanent ileostomy empty their bag 4-6 times at the least.   Use antidiarrheal medicine (especially Imodium) several times a day to avoid getting dehydrated.  Start with a dose at bedtime & breakfast.  Adjust up or   down as needed.  Increase antidiarrheal medications as directed to avoid emptying the bag more than 8 times a day (every 3 hours). Work with your wound ostomy nurse to learn care for your ostomy.  See ostomy care instructions. TROUBLESHOOTING IRREGULAR BOWELS 1) Start with a soft & bland diet. No spicy, greasy, or fried foods.  2) Avoid gluten/wheat or dairy products from diet to see if symptoms improve. 3) Miralax 17gm or flax seed mixed in 8oz. water or juice-daily. May use 2-4 times a day as needed. 4) Gas-X, Phazyme, etc. as needed for gas & bloating.  5) Prilosec (omeprazole) over-the-counter as needed 6)  Consider probiotics (Align, Activa, etc) to help calm the bowels down  Call your doctor if you are getting worse or not getting better.  Sometimes further testing (cultures, endoscopy, X-ray studies, CT scans, bloodwork, etc.) may be needed to help diagnose and treat the cause of the diarrhea. [facility] [address], [address], [place], [place]  [address] [phone] - Main.    [phone]  - Toll Free.   [phone] - Fax [email or web address]   ###############################   #######################################################  Ostomy Support Information  You've heard that people get along just fine with only one of their eyes, or one of their lungs, or one of their kidneys. But you also know that you have only one intestine and only one bladder, and that leaves you feeling awfully empty, both physically and emotionally: You think no other people go around without part of their intestine with the ends of their intestines sticking out through their abdominal walls.    YOU ARE NOT ALONE.  There are nearly three quarters of a million people in the [place] who have an ostomy; people who have had surgery to remove all or part of their colons or bladders.   There is even a national association, the [employer] with over 350 local affiliated support groups that are organized by volunteers who provide peer support and counseling. [employer] has a toll free telephone num-ber, 800-826-0826 and an educational, interactive website, www.ostomy.org   An ostomy is an opening in the belly (abdominal wall) made by surgery. Ostomates are people who have had this procedure. The opening (stoma) allows the kidney or bowel to grdischarge waste. An external pouch covers the stoma to collect waste. Pouches are are a simple bag and are odor free. Different companies have disposable or reusable pouches to fit one's lifestyle. An ostomy can either be temporary or permanent.   THERE ARE THREE MAIN TYPES OF OSTOMIES Colostomy. A colostomy is a surgically created opening in the large intestine (colon). Ileostomy. An ileostomy is a surgically created opening in the small intestine. Urostomy. A urostomy is a surgically created opening to divert urine away from the bladder.  OSTOMY Care  The following guidelines will make care of your colostomy easier. Keep this information close by for quick reference.  Helpful DIET hints Eat a well-balanced diet including vegetables and fresh fruits. Eat on a regular schedule.  Drink at least 6 to 8 glasses of fluids daily. Eat slowly in a relaxed atmosphere. Chew your food thoroughly. Avoid chewing gum, smoking, and drinking from a straw. This will help decrease the amount of air you swallow, which may help reduce gas. Eating yogurt or drinking buttermilk may help reduce gas.  To control gas at night, do not eat after 8 p.m. This will give your bowel time to quiet down before you go   to bed.  If gas is a problem, you can purchase  Beano. Sprinkle Beano on the first bite of food before eating to reduce gas. It has no flavor and should not change the taste of your food. You can buy Beano over the counter at your local drugstore.  Foods like fish, onions, garlic, broccoli, asparagus, and cabbage produce odor. Although your pouch is odor-proof, if you eat these foods you may notice a stronger odor when emptying your pouch. If this is a concern, you may want to limit these foods in your diet.  If you have an ileostomy, you will have chronic diarrhea & need to drink more liquids to avoid getting dehydrated.  Consider antidiarrheal medicine like imodium (loperamide) or Lomotil to help slow down bowel movements / diarrhea into your ileostomy bag.  GETTING TO GOOD BOWEL HEALTH WITH AN ILEOSTOMY    With the colon bypassed & not in use, you will have small bowel diarrhea.   It is important to thicken & slow your bowel movements down.   The goal: 4-6 small BOWEL MOVEMENTS A DAY It is important to drink plenty of liquids to avoid getting dehydrated  CONTROLLING ILEOSTOMY DIARRHEA  TAKE A FIBER SUPPLEMENT (FiberCon or Benefiner soluble fiber) twice a day - to thicken stools by absorbing excess fluid and retrain the intestines to act more normally.  Slowly increase the dose over a few weeks.  Too much fiber too soon can backfire and cause cramping & bloating.  TAKE AN IRON SUPPLEMENT twice a day to naturally constipate your bowels.  Usually ferrous sulfate 325mg twice a day)  TAKE ANTI-DIARRHEAL MEDICINES: Loperamide (Imodium) can slow down diarrhea.  Start with two tablets (= 4mg) first and then try one tablet every 6 hours.  Can go up to 2 pills four times day (8 pills of 2mg max) Avoid if you are having fevers or severe pain.  If you are not better or start feeling worse, stop all medicines and call your doctor for advice LoMotil (Diphenoxylate / Atropine) is another medicine that can constipate & slow down bowel moevements Pepto  Bismol (bismuth) can gently thicken bowels as well  If diarrhea is worse,: drink plenty of liquids and try simpler foods for a few days to avoid stressing your intestines further. Avoid dairy products (especially milk & ice cream) for a short time.  The intestines often can lose the ability to digest lactose when stressed. Avoid foods that cause gassiness or bloating.  Typical foods include beans and other legumes, cabbage, broccoli, and dairy foods.  Every person has some sensitivity to other foods, so listen to our body and avoid those foods that trigger problems for you.Call your doctor if you are getting worse or not better.  Sometimes further testing (cultures, endoscopy, X-ray studies, bloodwork, etc) may be needed to help diagnose and treat the cause of the diarrhea. Take extra anti-diarrheal medicines (maximum is 8 pills of 2mg loperamide a day)   Tips for POUCHING an OSTOMY   Changing Your Pouch The best time to change your pouch is in the morning, before eating or drinking anything. Your stoma can function at any time, but it will function more after eating or drinking.   Applying the pouching system  Place all your equipment close at hand before removing your pouch.  Wash your hands.  Stand or sit in front of a mirror. Use the position that works best for you. Remember that you must keep the skin around the stoma   wrinkle-free for a good seal.  Gently remove the used pouch (1-piece system) or the pouch and old wafer (2-piece system). Empty the pouch into the toilet. Save the closure clip to use again.  Wash the stoma itself and the skin around the stoma. Your stoma may bleed a little when being washed. This is normal. Rinse and pat dry. You may use a wash cloth or soft paper towels (like Bounty), mild soap (like Dial, Safeguard, or Ivory), and water. Avoid soaps that contain perfumes or lotions.  For a new pouch (1-piece system) or a new wafer (2-piece system), measure your  stoma using the stoma guide in each box of supplies.  Trace the shape of your stoma onto the back of the new pouch or the back of the new wafer. Cut out the opening. Remove the paper backing and set it aside.  Optional: Apply a skin barrier powder to surrounding skin if it is irritated (bare or weeping), and dust off the excess. Optional: Apply a skin-prep wipe (such as Skin Prep or All-Kare) to the skin around the stoma, and let it dry. Do not apply this solution if the skin is irritated (red, tender, or broken) or if you have shaved around the stoma. Optional: Apply a skin barrier paste (such as Stomahesive, Coloplast, or Premium) around the opening cut in the back of the pouch or wafer. Allow it to dry for 30 to 60 seconds.  Hold the pouch (1-piece system) or wafer (2-piece system) with the sticky side toward your body. Make sure the skin around the stoma is wrinkle-free. Center the opening on the stoma, then press firmly to your abdomen (Fig. 4). Look in the mirror to check if you are placing the pouch, or wafer, in the right position. For a 2-piece system, snap the pouch onto the wafer. Make sure it snaps into place securely.  Place your hand over the stoma and the pouch or wafer for about 30 seconds. The heat from your hand can help the pouch or wafer stick to your skin.  Add deodorant (such as Super Banish or Nullo) to your pouch. Other options include food extracts such as vanilla oil and peppermint extract. Add about 10 drops of the deodorant to the pouch. Then apply the closure clamp. Note: Do not use toxic  chemicals or commercial cleaning agents in your pouch. These substances may harm the stoma.  Optional: For extra seal, apply tape to all 4 sides around the pouch or wafer, as if you were framing a picture. You may use any brand of medical adhesive tape. Change your pouch every 5 to 7 days. Change it immediately if a leak occurs.  Wash your hands afterwards.  If you are wearing a  2-piece system, you may use 2 new pouches per week and alternate them. Rinse the pouch with mild soap and warm water and hang it to dry for the next day. Apply the fresh pouch. Alternate the 2 pouches like this for a week. After a week, change the wafer and begin with 2 new pouches. Place the old pouches in a plastic bag, and put them in the trash.   LIVING WITH AN OSTOMY  Emptying Your Pouch Empty your pouch when it is one-third full (of urine, stool, and/or gas). If you wait until your pouch is fuller than this, it will be more difficult to empty and more noticeable. When you empty your pouch, either put toilet paper in the toilet bowl first, or flush the   toilet while you empty the pouch. This will reduce splashing. You can empty the pouch between your legs or to one side while sitting, or while standing or stooping. If you have a 2-piece system, you can snap off the pouch to empty it. Remember that your stoma may function during this time. If you wish to rinse your pouch after you empty it, a turkey baster can be helpful. When using a baster, squirt water up into the pouch through the opening at the bottom. With a 2-piece system, you can snap off the pouch to rinse it. After rinsing  your pouch, empty it into the toilet. When rinsing your pouch at home, put a few granules of Dreft soap in the rinse water. This helps lubricate and freshen your pouch. The inside of your pouch can be sprayed with non-stick cooking oil ([name] spray). This may help reduce stool sticking to the inside of the pouch.  Bathing You may shower or bathe with your pouch on or off. Remember that your stoma may function during this time.  The materials you use to wash your stoma and the skin around it should be clean, but they do not need to be sterile.  Wearing Your Pouch During hot weather, or if you perspire a lot in general, wear a cover over your pouch. This may prevent a rash on your skin under the pouch. Pouch covers are  sold at ostomy supply stores. Wear the pouch inside your underwear for better support. Watch your weight. Any gain or loss of 10 to 15 pounds or more can change the way your pouch fits.  Going Away From Home A collapsible cup (like those that come in travel kits) or a soft plastic squirt bottle with a pull-up top (like a travel bottle for shampoo) can be used for rinsing your pouch when you are away from home. Tilt the opening of the pouch at an upward angle when using a cup to rinse.  Carry wet wipes or extra tissues to use in public bathrooms.  Carry an extra pouching system with you at all times.  Never keep ostomy supplies in the glove compartment of your car. Extreme heat or cold can damage the skin barriers and adhesive wafers on the pouch.  When you travel, carry your ostomy supplies with you at all times. Keep them within easy reach. Do not pack ostomy supplies in baggage that will be checked or otherwise separated from you, because your baggage might be lost. If you're traveling out of the country, it is helpful to have a letter stating that you are carrying ostomy supplies as a medical necessity.  If you need ostomy supplies while traveling, look in the yellow pages of the telephone book under "Surgical Supplies." Or call the local ostomy organization to find out where supplies are available.  Do not let your ostomy supplies get low. Always order new pouches before you use the last one.  Reducing Odor Limit foods such as broccoli, cabbage, onions, fish, and garlic in your diet to help reduce odor. Each time you empty your pouch, carefully clean the opening of the pouch, both inside and outside, with toilet paper. Rinse your pouch 1 or 2 times daily after you empty it (see directions for emptying your pouch and going away from home). Add deodorant (such as Super Banish or Nullo) to your pouch. Use air deodorizers in your bathroom. Do not add aspirin to your pouch. Even though  aspirin can help prevent odor, it   could cause ulcers on your stoma.  When to call the doctor Call the doctor if you have any of the following symptoms: Purple, black, or white stoma Severe cramps lasting more than 6 hours Severe watery discharge from the stoma lasting more than 6 hours No output from the colostomy for 3 days Excessive bleeding from your stoma Swelling of your stoma to more than 1/2-inch larger than usual Pulling inward of your stoma below skin level Severe skin irritation or deep ulcers Bulging or other changes in your abdomen  When to call your ostomy nurse Call your ostomy/enterostomal therapy (WOCN) nurse if any of the following occurs: Frequent leaking of your pouching system Change in size or appearance of your stoma, causing discomfort or problems with your pouch Skin rash or rawness Weight gain or loss that causes problems with your pouch     FREQUENTLY ASKED QUESTIONS   Why haven't you met any of these folks who have an ostomy?  Well, maybe you have! You just did not recognize them because an ostomy doesn't show. It can be kept secret if you wish. Why, maybe some of your best friends, office associates or neighbors have an ostomy ... you never can tell. People facing ostomy surgery have many quality-of-life questions like: Will you bulge? Smell? Make noises? Will you feel waste leaving your body? Will you be a captive of the toilet? Will you starve? Be a social outcast? Get/stay married? Have babies? Easily bathe, go swimming, bend over?  OK, let's look at what you can expect:   Will you bulge?  Remember, without part of the intestine or bladder, and its contents, you should have a flatter tummy than before. You can expect to wear, with little exception, what you wore before surgery ... and this in-cludes tight clothing and bathing suits.   Will you smell?  Today, thanks to modern odor proof pouching systems, you can walk into an ostomy support group  meeting and not smell anything that is foul or offensive. And, for those with an ileostomy or colostomy who are concerned about odor when emptying their pouch, there are in-pouch deodorants that can be used to eliminate any waste odors that may exist.   Will you make noises?  Everyone produces gas, especially if they are an air-swallower. But intestinal sounds that occur from time to time are no differ-ent than a gurgling tummy, and quite often your clothing will muffle any sounds.   Will you feel the waste discharges?  For those with a colostomy or ileostomy there might be a slight pressure when waste leaves your body, but understand that the intestines have no nerve endings, so there will be no unpleasant sensations. Those with a urostomy will probably be unaware of any kidney drainage.   Will you be a captive of the toilet?  Immediately post-op you will spend more time in the bathroom than you will after your body recovers from surgery. Every person is different, but on average those with an ileostomy or urostomy may empty their pouches 4 to 6 times a day; a little  less if you have a colostomy. The average wear time between pouch system changes is 3 to 5 days and the changing process should take less than 30 minutes.   Will I need to be on a special diet? Most people return to their normal diet when they have recovered from surgery. Be sure to chew your food well, eat a well-balanced diet and drink plenty of fluids. If   you experience problems with a certain food, wait a couple of weeks and try it again.  Will there be odor and noises? Pouching systems are designed to be odor-proof or odor-resistant. There are deodorants that can be used in the pouch. Medications are also available to help reduce odor. Limit gas-producing foods and carbonated beverages. You will experience less gas and fewer noises as you heal from surgery.  How much time will it take to care for my ostomy? At first, you may  spend a lot of time learning about your ostomy and how to take care of it. As you become more comfortable and skilled at changing the pouching system, it will take very little time to care for it.   Will I be able to return to work? People with ostomies can perform most jobs. As soon as you have healed from surgery, you should be able to return to work. Heavy lifting (more than 10 pounds) may be discouraged.   What about intimacy? Sexual relationships and intimacy are important and fulfilling aspects of your life. They should continue after ostomy surgery. Intimacy-related concerns should be discussed openly between you and your partner.   Can I wear regular clothing? You do not need to wear special clothing. Ostomy pouches are fairly flat and barely noticeable. Elastic undergarments will not hurt the stoma or prevent the ostomy from functioning.   Can I participate in sports? An ostomy should not limit your involvement in sports. Many people with ostomies are runners, skiers, swimmers or participate in other active lifestyles. Talk with your caregiver first before doing heavy physical activity.  Will you starve?  Not if you follow doctor's orders at each stage of your post-op adjustment. There is no such thing as an "ostomy diet". Some people with an ostomy will be able to eat and tolerate anything; others may find diffi-culty with some foods. Each person is an individual and must determine, by trial, what is best for them. A good practice for all is to drink plenty of water.   Will you be a social outcast?  Have you met anyone who has an ostomy and is a social outcast? Why should you be the first? Only your attitude and self image will effect how you are treated. No confi-dent person is an outcast.    PROFESSIONAL HELP   Resources are available if you need help or have questions about your ostomy.   Specially trained nurses called Wound, Ostomy Continence Nurses (WOCN) are available for  consultation in most major medical centers.  Consider getting an ostomy consult at an outpatient ostomy clinic.   [facility] has an Ostomy Clinic run by an WOCN ostomy nurse at the [facility].  [phone]. [facility] can help set up an appointment   The United Ostomy Association (UOA) is a group made up of many local chapters throughout the [place]. These local groups hold meetings and provide support to prospective and existing ostomates. They sponsor educational events and have qualified visitors to make personal or telephone visits. Contact the UOA for the chapter nearest you and for other educational publications.  More detailed information can be found in Colostomy Guide, a publication of the United Ostomy Association (UOA). Contact UOA at 1-800-826-0826 or visit their web site at www.uoaa.org. The website contains links to other sites, suppliers and resources.  [facility] Secure Start Services: Start at the website to enlist for support.  Your Wound Ostomy (WOCN) nurse may have started this   process. https://www.[employer].com/en/securestart Secure Start services are designed to support people as they live their lives with an ostomy or neurogenic bladder. Enrolling is easy and at no cost to the patient. We realize that each person's needs and life journey are different. Through Secure Start services, we want to help people live their life, their way.  #######################################################  

## 2022-11-04 NOTE — TOC CM/SW Note (Signed)
Transition of Care [facility]) Screening Note  Patient Details  Name: Brooke Duncan Date of Birth: August 19, 1946  Transition of Care [facility]) CM/SW Contact:    Sherie Don, LCSW Phone Number: 11/04/2022, 11:59 AM  Transition of Care Department [facility]) has reviewed patient and no TOC needs have been identified at this time. We will continue to monitor patient advancement through interdisciplinary progression rounds. If new patient transition needs arise, please place a TOC consult.

## 2022-11-04 NOTE — Progress Notes (Addendum)
1 Day Post-Op Robotic R colectomy Subjective: Tolerating liquids, no flatus or BM yet, ambulating in the hall, pain not controlled with Tramadol  Objective: Vital signs in last 24 hours: Temp:  [96.4 F (35.8 C)-98.3 F (36.8 C)] 97.8 F (36.6 C) (12/01 1007) Pulse Rate:  [56-84] 71 (12/01 1007) Resp:  [8-22] 14 (12/01 1007) BP: (111-149)/(62-82) 130/66 (12/01 1007) SpO2:  [91 %-100 %] 98 % (12/01 1007)   Intake/Output from previous day: 11/30 0701 - 12/01 0700 In: 3843 [P.O.:1000; I.V.:2843] Out: 2100 [Urine:2050; Blood:50] Intake/Output this shift: No intake/output data recorded.   General appearance: alert and cooperative GI: normal findings: soft, non-distended  Incision: no significant drainage  Lab Results:  Recent Labs    11/04/22 0431  WBC 15.7*  HGB 12.1  HCT 38.9  PLT 224   BMET Recent Labs    11/04/22 0431  NA 137  K 3.7  CL 104  CO2 26  GLUCOSE 128*  BUN 6*  CREATININE 0.78  CALCIUM 8.7*   PT/INR No results for input(s): "LABPROT", "INR" in the last 72 hours. ABG No results for input(s): "PHART", "HCO3" in the last 72 hours.  Invalid input(s): "PCO2", "PO2"  MEDS, Scheduled  alvimopan  12 mg Oral BID   cholestyramine light  4 g Oral Q12H   enoxaparin (LOVENOX) injection  40 mg Subcutaneous Q24H   feeding supplement  237 mL Oral BID BM   gabapentin  300 mg Oral BID   letrozole  2.5 mg Oral QPM   levothyroxine  150 mcg Oral Q0600   losartan  50 mg Oral Daily   saccharomyces boulardii  250 mg Oral BID   simvastatin  10 mg Oral QHS    Studies/Results: No results found.  Assessment: s/p Procedure(s): XI ROBOT ASSISTED LAPAROSCOPIC PARTIAL COLECTOMY Patient Active Problem List   Diagnosis Date Noted   Colonic mass 11/03/2022   Malignant neoplasm of upper-outer quadrant of left breast in female, estrogen receptor positive ([place]) 08/27/2021   Abnormal ultrasound of endometrium 04/08/2021    Expected post op course  Plan: d/c  foley Advance diet as tolerated SL IVFs Switch to Vicodin for pain control Cont to ambulate   LOS: 1 day     .Rosario Adie, MD [facility], [place]    11/04/2022 11:13 AM

## 2022-11-04 NOTE — Anesthesia Postprocedure Evaluation (Signed)
Anesthesia Post Note  Patient: Brooke Duncan  Procedure(s) Performed: XI ROBOT ASSISTED LAPAROSCOPIC PARTIAL COLECTOMY     Patient location during evaluation: PACU Anesthesia Type: General Level of consciousness: awake and alert Pain management: pain level controlled Vital Signs Assessment: post-procedure vital signs reviewed and stable Respiratory status: spontaneous breathing, nonlabored ventilation, respiratory function stable and patient connected to nasal cannula oxygen Cardiovascular status: blood pressure returned to baseline and stable Postop Assessment: no apparent nausea or vomiting Anesthetic complications: no  No notable events documented.  Last Vitals:  Vitals:   11/04/22 0610 11/04/22 1007  BP: 117/63 130/66  Pulse: 65 71  Resp: 16 14  Temp: 36.4 C 36.6 C  SpO2: 94% 98%    Last Pain:  Vitals:   11/04/22 1047  TempSrc:   PainSc: 3                  Nithin Demeo L Robynn Marcel

## 2022-11-05 LAB — BASIC METABOLIC PANEL
Anion gap: 5 (ref 5–15)
BUN: 9 mg/dL (ref 8–23)
CO2: 30 mmol/L (ref 22–32)
Calcium: 9 mg/dL (ref 8.9–10.3)
Chloride: 102 mmol/L (ref 98–111)
Creatinine, Ser: 1.14 mg/dL — ABNORMAL HIGH (ref 0.44–1.00)
GFR, Estimated: 50 mL/min — ABNORMAL LOW (ref >60)
Glucose, Bld: 111 mg/dL — ABNORMAL HIGH (ref 70–99)
Potassium: 3.9 mmol/L (ref 3.5–5.1)
Sodium: 137 mmol/L (ref 135–145)

## 2022-11-05 LAB — CBC
HCT: 41.3 % (ref 36.0–46.0)
Hemoglobin: 12.6 g/dL (ref 12.0–15.0)
MCH: 26.9 pg (ref 26.0–34.0)
MCHC: 30.5 g/dL (ref 30.0–36.0)
MCV: 88.2 fL (ref 80.0–100.0)
Platelets: 228 10*3/uL (ref 150–400)
RBC: 4.68 MIL/uL (ref 3.87–5.11)
RDW: 16.3 % — ABNORMAL HIGH (ref 11.5–15.5)
WBC: 12 10*3/uL — ABNORMAL HIGH (ref 4.0–10.5)
nRBC: 0 % (ref 0.0–0.2)

## 2022-11-05 NOTE — Progress Notes (Signed)
2 Days Post-Op   Subjective/Chief Complaint: Comfortable this morning but still needing some pain meds Had very tiny liquid BM Tolerating po   Objective: Vital signs in last 24 hours: Temp:  [97.8 F (36.6 C)-99.2 F (37.3 C)] 99.2 F (37.3 C) (12/02 0457) Pulse Rate:  [71-87] 87 (12/02 0457) Resp:  [14] 14 (12/02 0457) BP: (129-152)/(61-74) 129/61 (12/02 0457) SpO2:  [93 %-98 %] 93 % (12/02 0457) Weight:  [126.2 kg] 126.2 kg (12/02 0500) Last BM Date : 11/04/22  Intake/Output from previous day: 12/01 0701 - 12/02 0700 In: 780 [P.O.:780] Out: 2800 [Urine:2800] Intake/Output this shift: No intake/output data recorded.  Exam: Awake and alert Abdomen soft, obese.  I removed dressings at port sites and incisions look good.  Will leave suprapubic dressing until tomorrow  Lab Results:  Recent Labs    11/04/22 0431 11/05/22 0450  WBC 15.7* 12.0*  HGB 12.1 12.6  HCT 38.9 41.3  PLT 224 228   BMET Recent Labs    11/04/22 0431 11/05/22 0450  NA 137 137  K 3.7 3.9  CL 104 102  CO2 26 30  GLUCOSE 128* 111*  BUN 6* 9  CREATININE 0.78 1.14*  CALCIUM 8.7* 9.0   PT/INR No results for input(s): "LABPROT", "INR" in the last 72 hours. ABG No results for input(s): "PHART", "HCO3" in the last 72 hours.  Invalid input(s): "PCO2", "PO2"  Studies/Results: No results found.  Anti-infectives: Anti-infectives (From admission, onward)    Start     Dose/Rate Route Frequency Ordered Stop   11/03/22 2000  cefoTEtan (CEFOTAN) 2 g in sodium chloride 0.9 % 100 mL IVPB        2 g 200 mL/hr over 30 Minutes Intravenous Every 12 hours 11/03/22 1347 11/03/22 2002   11/03/22 0600  cefoTEtan (CEFOTAN) 2 g in sodium chloride 0.9 % 100 mL IVPB        2 g 200 mL/hr over 30 Minutes Intravenous On call to O.R. 11/03/22 0537 11/03/22 0748       Assessment/Plan: s/p Procedure(s): XI ROBOT ASSISTED LAPAROSCOPIC PARTIAL COLECTOMY (N/A)  Continue diet Awaiting improved bowel  function Having some difficulty getting up Patient walks with a walker and lives alone so not ready for discharge yet  LOS: 2 days    Coralie Keens 11/05/2022

## 2022-11-05 NOTE — Progress Notes (Signed)
Mobility Specialist - Progress Note   11/05/22 1049  Mobility  Activity Ambulated with assistance in hallway;Ambulated with assistance to bathroom  Level of Assistance Standby assist, set-up cues, supervision of patient - no hands on  Assistive Device Four wheel walker  Distance Ambulated (ft) 480 ft  Activity Response Tolerated well  Mobility Referral Yes  $Mobility charge 1 Mobility   Pt received in bed and agreeable to mobility. No complaints during mobility. Pt a little SOB after ambulating in hall & to bathroom. Pt to bed after session with all needs met & nurse in room.     [facility]

## 2022-11-06 LAB — CBC
HCT: 43.2 % (ref 36.0–46.0)
Hemoglobin: 13.3 g/dL (ref 12.0–15.0)
MCH: 26.8 pg (ref 26.0–34.0)
MCHC: 30.8 g/dL (ref 30.0–36.0)
MCV: 87.1 fL (ref 80.0–100.0)
Platelets: 233 10*3/uL (ref 150–400)
RBC: 4.96 MIL/uL (ref 3.87–5.11)
RDW: 16.2 % — ABNORMAL HIGH (ref 11.5–15.5)
WBC: 12.1 10*3/uL — ABNORMAL HIGH (ref 4.0–10.5)
nRBC: 0 % (ref 0.0–0.2)

## 2022-11-06 LAB — BASIC METABOLIC PANEL
Anion gap: 8 (ref 5–15)
BUN: 10 mg/dL (ref 8–23)
CO2: 27 mmol/L (ref 22–32)
Calcium: 8.7 mg/dL — ABNORMAL LOW (ref 8.9–10.3)
Chloride: 102 mmol/L (ref 98–111)
Creatinine, Ser: 0.64 mg/dL (ref 0.44–1.00)
GFR, Estimated: >60 mL/min (ref >60)
Glucose, Bld: 128 mg/dL — ABNORMAL HIGH (ref 70–99)
Potassium: 3.7 mmol/L (ref 3.5–5.1)
Sodium: 137 mmol/L (ref 135–145)

## 2022-11-06 NOTE — Progress Notes (Signed)
3 Days Post-Op   Subjective/Chief Complaint: Complains of soreness. Concerned about getting uti but no symptoms. Big concern is that she is not mobile enough to clean herself   Objective: Vital signs in last 24 hours: Temp:  [97.9 F (36.6 C)-98.9 F (37.2 C)] 97.9 F (36.6 C) (12/03 0541) Pulse Rate:  [73-89] 75 (12/03 0541) Resp:  [14-16] 16 (12/03 0541) BP: (121-130)/(70-71) 121/70 (12/03 0541) SpO2:  [90 %-92 %] 90 % (12/03 0541) Weight:  [126.9 kg] 126.9 kg (12/03 0500) Last BM Date : 11/05/22  Intake/Output from previous day: 12/02 0701 - 12/03 0700 In: 520 [P.O.:520] Out: 1800 [Urine:1800] Intake/Output this shift: Total I/O In: -  Out: 400 [Urine:400]  General appearance: alert and cooperative Resp: clear to auscultation bilaterally Cardio: regular rate and rhythm GI: soft, mild tenderness. Incisions look good  Lab Results:  Recent Labs    11/05/22 0450 11/06/22 0407  WBC 12.0* 12.1*  HGB 12.6 13.3  HCT 41.3 43.2  PLT 228 233   BMET Recent Labs    11/05/22 0450 11/06/22 0407  NA 137 137  K 3.9 3.7  CL 102 102  CO2 30 27  GLUCOSE 111* 128*  BUN 9 10  CREATININE 1.14* 0.64  CALCIUM 9.0 8.7*   PT/INR No results for input(s): "LABPROT", "INR" in the last 72 hours. ABG No results for input(s): "PHART", "HCO3" in the last 72 hours.  Invalid input(s): "PCO2", "PO2"  Studies/Results: No results found.  Anti-infectives: Anti-infectives (From admission, onward)    Start     Dose/Rate Route Frequency Ordered Stop   11/03/22 2000  cefoTEtan (CEFOTAN) 2 g in sodium chloride 0.9 % 100 mL IVPB        2 g 200 mL/hr over 30 Minutes Intravenous Every 12 hours 11/03/22 1347 11/03/22 2002   11/03/22 0600  cefoTEtan (CEFOTAN) 2 g in sodium chloride 0.9 % 100 mL IVPB        2 g 200 mL/hr over 30 Minutes Intravenous On call to O.R. 11/03/22 0537 11/03/22 0748       Assessment/Plan: s/p Procedure(s): XI ROBOT ASSISTED LAPAROSCOPIC PARTIAL COLECTOMY  (N/A) Advance diet Ambulate Will ask if she would benefit from short rehab prior to going home  LOS: 3 days    Autumn Messing III 11/06/2022

## 2022-11-06 NOTE — Progress Notes (Signed)
Inpatient Rehab Admissions Coordinator:  Consult received. Note pt does not have therapy orders. Unable to determine pt's need for CIR at this time.   Gayland Curry, [place], [place] Admissions Coordinator [phone]

## 2022-11-06 NOTE — TOC Progression Note (Addendum)
Transition of Care [facility]) - Progression Note    Patient Details  Name: Brooke Duncan MRN: [id number] Date of Birth: [birth year]  Transition of Care [facility]) CM/SW Contact  Henrietta Dine, RN Phone Number: 11/06/2022, 3:21 PM  Clinical Narrative:    Order received for CIR; pt will need PT eval; awaiting orders; TOC will con't to follow.     Barriers to Discharge: No Barriers Identified  Expected Discharge Plan and Services                                                 Social Determinants of Health (SDOH) Interventions    Readmission Risk Interventions     No data to display

## 2022-11-07 LAB — SURGICAL PATHOLOGY

## 2022-11-07 NOTE — Evaluation (Signed)
Occupational Therapy Evaluation Patient Details Name: Brooke Duncan MRN: [id number] DOB: [birth year] Today's Date: 11/07/2022   History of Present Illness Patient is a 76 year old female who underwent robot assisted laparoscopic partial colectomy on 11/30. 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 Patient was living at home alone at rollator level PLOF. Currently, patient is supervision for UB ADLs standing at sink with max A for hygiene and LB dressing tasks. Patient was educated on AE use to increase independence in tasks. Patient reported she would look into purchasing these items. Patient would continue to benefit from skilled OT services at this time while admitted  to address noted deficits in order to improve overall safety and independence in ADLs.        Recommendations for follow up therapy are one component of a multi-disciplinary discharge planning process, led by the attending physician.  Recommendations may be updated based on patient status, additional functional criteria and insurance authorization.   Follow Up Recommendations  No OT follow up     Assistance Recommended at Discharge PRN  Patient can return home with the following Assistance with cooking/housework;Assist for transportation;Help with stairs or ramp for entrance    Functional Status Assessment  Patient has had a recent decline in their functional status and demonstrates the ability to make significant improvements in function in a reasonable and predictable amount of time.  Equipment Recommendations  Other (comment) (reacher, toileting buddy)    Recommendations for Other Services       Precautions / Restrictions Precautions Precaution Comments: abdominal precautions Restrictions Weight Bearing Restrictions: No      Mobility Bed Mobility Overal bed mobility: Needs Assistance Bed  Mobility: Supine to Sit     Supine to sit: Supervision     General bed mobility comments: with education on log rolling.    Transfers                          Balance Overall balance assessment: No apparent balance deficits (not formally assessed)                                         ADL either performed or assessed with clinical judgement   ADL Overall ADL's : Needs assistance/impaired Eating/Feeding: Modified independent;Sitting   Grooming: Wash/dry face;Wash/dry hands;Supervision/safety;Standing Grooming Details (indicate cue type and reason): at sink Upper Body Bathing: Set up;Standing Upper Body Bathing Details (indicate cue type and reason): at sink Lower Body Bathing: Moderate assistance;Sitting/lateral leans Lower Body Bathing Details (indicate cue type and reason): education on long handled spongs and reacher Upper Body Dressing : Set up;Sitting   Lower Body Dressing: Sitting/lateral leans;Moderate assistance Lower Body Dressing Details (indicate cue type and reason): education on using reacher for LB dressing Toilet Transfer: Supervision/safety;Rollator (4 wheels) Toilet Transfer Details (indicate cue type and reason): education on turning wiht rollator v.s. picking it up and manuvering in bathroom wtih abdominal precautions. Toileting- Clothing Manipulation and Hygiene: Maximal assistance Toileting - Clothing Manipulation Details (indicate cue type and reason): max A for hygiene with supervision for clothing managment. patient was educated on bidets and toileting wands/buddy. patient to order one.             Vision Baseline Vision/History: 1 Wears  glasses       Perception     Praxis      Pertinent Vitals/Pain Pain Assessment Pain Assessment: Faces Faces Pain Scale: Hurts a little bit Pain Location: abdomen Pain Descriptors / Indicators: Grimacing, Guarding Pain Intervention(s): Limited activity within patient's  tolerance, Monitored during session, Repositioned     Hand Dominance Right   Extremity/Trunk Assessment Upper Extremity Assessment Upper Extremity Assessment: Overall WFL for tasks assessed   Lower Extremity Assessment Lower Extremity Assessment: Defer to PT evaluation   Cervical / Trunk Assessment Cervical / Trunk Assessment: Normal   Communication Communication Communication: No difficulties   Cognition Arousal/Alertness: Awake/alert Behavior During Therapy: WFL for tasks assessed/performed Overall Cognitive Status: Within Functional Limits for tasks assessed                                 General Comments: plesant and cooperative during session     General Comments       Exercises     Shoulder Instructions      Home Living Family/patient expects to be discharged to:: Private residence Living Arrangements: Alone Available Help at Discharge: Family;Available PRN/intermittently Type of Home: House Home Access: Stairs to enter [employer] of Steps: 1   Home Layout: One level               Home Equipment: Rollator (4 wheels)          Prior Functioning/Environment Prior Level of Function : Independent/Modified Independent                        OT Problem List: Pain;Decreased knowledge of use of DME or AE;Decreased knowledge of precautions      OT Treatment/Interventions: Self-care/ADL training;Energy conservation;Therapeutic exercise;DME and/or AE instruction;Therapeutic activities;Patient/family education    OT Goals(Current goals can be found in the care plan section) Acute Rehab OT Goals Patient Stated Goal: to be able to wash bottom independently OT Goal Formulation: With patient Time For Goal Achievement: 11/21/22 Potential to Achieve Goals: Fair  OT Frequency: Min 2X/week    Co-evaluation              AM-PAC OT "6 Clicks" Daily Activity     Outcome Measure Help from another person eating meals?:  None Help from another person taking care of personal grooming?: None Help from another person toileting, which includes using toliet, bedpan, or urinal?: A Little Help from another person bathing (including washing, rinsing, drying)?: A Little   Help from another person to put on and taking off regular lower body clothing?: A Little 6 Click Score: 17   End of Session Equipment Utilized During Treatment: Gait belt;Rollator (4 wheels)  Activity Tolerance: Patient tolerated treatment well Patient left: in chair;with call bell/phone within reach  OT Visit Diagnosis: Unsteadiness on feet (R26.81);Other abnormalities of gait and mobility (R26.89)                Time: 6599-3570 OT Time Calculation (min): 44 min Charges:  OT General Charges $OT Visit: 1 Visit OT Evaluation $OT Eval Low Complexity: 1 Low OT Treatments $Self Care/Home Management : 23-37 mins  Sydni Elizarraraz OTR/L, MS Acute Rehabilitation Department Office# [phone]   Willa Rough 11/07/2022, 9:36 AM

## 2022-11-07 NOTE — TOC Initial Note (Signed)
Transition of Care [facility]) - Initial/Assessment Note   Patient Details  Name: Brooke Duncan MRN: [id number] Date of Birth: 04/14/1946  [facility] CM/SW Contact:    Sherie Don, LCSW Phone Number: 11/07/2022, 1:39 PM  Clinical Narrative: [facility] consulted for [facility] needs. PT recommended HHPT. CSW met with patient to discuss recommendations and patient is agreeable to [facility] referral. CSW made [facility] referral to Cindie with [facility] for PT and an aide, which was accepted. Patient updated and [place] orders have been placed.  Expected Discharge Plan: [place] Barriers to Discharge: No Barriers Identified  Patient Goals and CMS Choice Patient states their goals for this hospitalization and ongoing recovery are:: Return home with [facility] CMS Medicare.gov Compare Post Acute Care list provided to:: Patient Choice offered to / list presented to : Patient  Expected Discharge Plan and Services Expected Discharge Plan: [place] In-house Referral: Clinical Social Work Post Acute Care Choice: [place] arrangements for the past 2 months: Single Family Home            DME Arranged: N/A DME Agency: NA HH Arranged: PT, Nurse's Aide [place] Agency: [place] Date [place]: 11/07/22 Time [place]: [id number] Representative spoke with at [place]: Cindie  Prior Living Arrangements/Services Living arrangements for the past 2 months: [place] Patient language and need for interpreter reviewed:: Yes Do you feel safe going back to the place where you live?: Yes      Need for Family Participation in Patient Care: No (Comment) Care giver support system in place?: Yes (comment) Criminal Activity/Legal Involvement Pertinent to Current Situation/Hospitalization: No - Comment as needed  Activities of Daily Living Home Assistive Devices/Equipment: None ADL Screening (condition at time of admission) Patient's cognitive ability  adequate to safely complete daily activities?: Yes Is the patient deaf or have difficulty hearing?: No Does the patient have difficulty seeing, even when wearing glasses/contacts?: No Does the patient have difficulty concentrating, remembering, or making decisions?: No Patient able to express need for assistance with ADLs?: Yes Does the patient have difficulty dressing or bathing?: No Independently performs ADLs?: Yes (appropriate for developmental age) Does the patient have difficulty walking or climbing stairs?: No Weakness of Legs: None Weakness of Arms/Hands: None  Permission Sought/Granted Permission sought to share information with : Other (comment) Permission granted to share information with : Yes, Verbal Permission Granted Permission granted to share info w AGENCY: [place] agencies  Emotional Assessment Appearance:: Appears stated age Attitude/Demeanor/Rapport: Engaged Affect (typically observed): Accepting Orientation: : Oriented to Self, Oriented to Place, Oriented to  Time, Oriented to Situation Alcohol / Substance Use: Not Applicable Psych Involvement: No (comment)  Admission diagnosis:  Colonic mass [K63.89] Patient Active Problem List   Diagnosis Date Noted   Colonic mass 11/03/2022   Malignant neoplasm of upper-outer quadrant of left breast in female, estrogen receptor positive ([place]) 08/27/2021   Abnormal ultrasound of endometrium 04/08/2021   PCP:  Lawerance Cruel, MD Pharmacy:   [id number] [place] [address] [id number]: [phone]: [phone] Social Determinants of Health (SDOH) Interventions    Readmission Risk Interventions     No data to display

## 2022-11-07 NOTE — Progress Notes (Signed)
4 Days Post-Op Robotic R colectomy Subjective: Tolerating solid foods, having BM's, pain controlled.  Concerned about caring for herself post op at home since she lives alone Objective: Vital signs in last 24 hours: Temp:  [98.1 F (36.7 C)-98.4 F (36.9 C)] 98.4 F (36.9 C) (12/04 0420) Pulse Rate:  [70-77] 76 (12/04 0420) Resp:  [16-18] 18 (12/04 0420) BP: (133-147)/(64-72) 142/67 (12/04 0420) SpO2:  [94 %-96 %] 96 % (12/04 0420) Weight:  [125.5 kg] 125.5 kg (12/04 0446)   Intake/Output from previous day: 12/03 0701 - 12/04 0700 In: 880 [P.O.:880] Out: 2800 [Urine:2800] Intake/Output this shift: No intake/output data recorded.   General appearance: alert and cooperative GI: normal findings: soft, non-distended  Incision: no significant drainage  Lab Results:  Recent Labs    11/05/22 0450 11/06/22 0407  WBC 12.0* 12.1*  HGB 12.6 13.3  HCT 41.3 43.2  PLT 228 233    BMET Recent Labs    11/05/22 0450 11/06/22 0407  NA 137 137  K 3.9 3.7  CL 102 102  CO2 30 27  GLUCOSE 111* 128*  BUN 9 10  CREATININE 1.14* 0.64  CALCIUM 9.0 8.7*    PT/INR No results for input(s): "LABPROT", "INR" in the last 72 hours. ABG No results for input(s): "PHART", "HCO3" in the last 72 hours.  Invalid input(s): "PCO2", "PO2"  MEDS, Scheduled  cholestyramine light  4 g Oral Q12H   enoxaparin (LOVENOX) injection  40 mg Subcutaneous Q24H   feeding supplement  237 mL Oral BID BM   gabapentin  300 mg Oral BID   letrozole  2.5 mg Oral QPM   levothyroxine  150 mcg Oral Q0600   losartan  50 mg Oral Daily   saccharomyces boulardii  250 mg Oral BID   simvastatin  10 mg Oral QHS    Studies/Results: No results found.  Assessment: s/p Procedure(s): XI ROBOT ASSISTED LAPAROSCOPIC PARTIAL COLECTOMY Patient Active Problem List   Diagnosis Date Noted   Colonic mass 11/03/2022   Malignant neoplasm of upper-outer quadrant of left breast in female, estrogen receptor positive ([place])  08/27/2021   Abnormal ultrasound of endometrium 04/08/2021    Expected post op course  Plan: d/c foley Cont diet as tolerated SL IVFs  Vicodin for pain control Change dressing daily Cont to ambulate PT/OT eval   LOS: 4 days     .Rosario Adie, MD [facility], [place]    11/07/2022 8:30 AM

## 2022-11-07 NOTE — Progress Notes (Signed)
Inpatient Rehabilitation Admissions Coordinator   Noted PT and OT ordered today. I await their evaluations to be able to assess need for Cir level rehab.  Danne Baxter, RN, MSN Rehab Admissions Coordinator [phone] 11/07/2022 9:11 AM

## 2022-11-07 NOTE — Progress Notes (Addendum)
Inpatient Rehabilitation Admissions Coordinator   Noted PT and OT not recommending CIR level rehab. She is not in need of intensive therapies at this time. We will sign off. Other venues should be considered. Acute team and TOC made aware.  Danne Baxter, RN, MSN Rehab Admissions Coordinator [phone] 11/07/2022 11:14 AM

## 2022-11-07 NOTE — Evaluation (Signed)
Physical Therapy Evaluation Patient Details Name: Brooke Duncan MRN: [id number] DOB: 05/12/1946 Today's Date: 11/07/2022  History of Present Illness  Patient is a 76 year old female who underwent robot assisted laparoscopic partial colectomy on 11/30. 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  Pt currently with functional limitations due to the deficits listed below (see PT Problem List). Pt will benefit from skilled PT to increase their independence and safety with mobility to allow discharge to the venue listed below.    The  patient is eager to ambulate this AM. Patient  plans to return home at DC        Recommendations for follow up therapy are one component of a multi-disciplinary discharge planning process, led by the attending physician.  Recommendations may be updated based on patient status, additional functional criteria and insurance authorization.  Follow Up Recommendations Home health PT      Assistance Recommended at Discharge PRN  Patient can return home with the following  Help with stairs or ramp for entrance;Assistance with cooking/housework    Equipment Recommendations None recommended by PT  Recommendations for Other Services       Functional Status Assessment Patient has had a recent decline in their functional status and demonstrates the ability to make significant improvements in function in a reasonable and predictable amount of time.     Precautions / Restrictions Precautions Precautions: Fall Precaution Comments: abdominal precautions Restrictions Weight Bearing Restrictions: No      Mobility  Bed Mobility               General bed mobility comments: in recloiner    Transfers Overall transfer level: Modified independent Equipment used: None               General transfer comment: stands without support    Ambulation/Gait Ambulation/Gait  assistance: Supervision Gait Distance (Feet): 350 Feet Assistive device: Rolling walker (2 wheels) Gait Pattern/deviations: Step-to pattern, Antalgic Gait velocity: decr     General Gait Details: lurches  to left  Stairs            Wheelchair Mobility    Modified Rankin (Stroke Patients Only)       Balance Overall balance assessment: No apparent balance deficits (not formally assessed)                                           Pertinent Vitals/Pain Pain Assessment Faces Pain Scale: Hurts a little bit Pain Location: abdomen Pain Descriptors / Indicators: Grimacing, Guarding Pain Intervention(s): Monitored during session    Home Living Family/patient expects to be discharged to:: Private residence Living Arrangements: Alone Available Help at Discharge: Family;Available PRN/intermittently Type of Home: House Home Access: Stairs to enter   [employer] of Steps: 1   Home Layout: One level Home Equipment: Rollator (4 wheels) Additional Comments: neice lives locally    Prior Function Prior Level of Function : Independent/Modified Independent             Mobility Comments: uses a rollator ADLs Comments: IADL's     Hand Dominance   Dominant Hand: Right    Extremity/Trunk Assessment   Upper Extremity Assessment Upper Extremity Assessment: Overall WFL for tasks assessed;LUE deficits/detail LUE Deficits / Details: pt states that she cannot raise the  arm- due for TSA  this month but now cancelled    Lower Extremity Assessment Lower Extremity Assessment:  (reports right knee  needs TKA)    Cervical / Trunk Assessment Cervical / Trunk Assessment: Normal  Communication   Communication: No difficulties  Cognition Arousal/Alertness: Awake/alert Behavior During Therapy: WFL for tasks assessed/performed Overall Cognitive Status: Within Functional Limits for tasks assessed                                           General Comments      Exercises     Assessment/Plan    PT Assessment Patient needs continued PT services  PT Problem List Decreased strength;Decreased mobility;Pain       PT Treatment Interventions DME instruction;Therapeutic exercise;Gait training;Functional mobility training;Patient/family education    PT Goals (Current goals can be found in the Care Plan section)  Acute Rehab PT Goals Patient Stated Goal: go home PT Goal Formulation: With patient Time For Goal Achievement: 11/21/22 Potential to Achieve Goals: Good    Frequency Min 3X/week     Co-evaluation               AM-PAC PT "6 Clicks" Mobility  Outcome Measure Help needed turning from your back to your side while in a flat bed without using bedrails?: A Little Help needed moving from lying on your back to sitting on the side of a flat bed without using bedrails?: A Little Help needed moving to and from a bed to a chair (including a wheelchair)?: A Little Help needed standing up from a chair using your arms (e.g., wheelchair or bedside chair)?: A Little Help needed to walk in hospital room?: A Little Help needed climbing 3-5 steps with a railing? : A Little 6 Click Score: 18    End of Session   Activity Tolerance: Patient tolerated treatment well Patient left: in chair;with call bell/phone within reach Nurse Communication: Mobility status PT Visit Diagnosis: Unsteadiness on feet (R26.81);Difficulty in walking, not elsewhere classified (R26.2);Pain Pain - Right/Left: Left Pain - part of body: Shoulder    Time: 9211-9417 PT Time Calculation (min) (ACUTE ONLY): 20 min   Charges:   PT Evaluation $PT Eval Low Complexity: 1 Low          [place] Office [phone] Weekend [phone]   [name] 11/07/2022, 9:56 AM

## 2022-11-07 NOTE — Care Management Important Message (Signed)
Important Message  Patient Details IM Letter given to the Patient. Name: Brooke Duncan MRN: [id number] Date of Birth: 11/29/1946   Medicare Important Message Given:  Yes     Kerin Salen 11/07/2022, 12:37 PM

## 2022-11-07 NOTE — Progress Notes (Signed)
Mobility Specialist - Progress Note   11/07/22 1201  Mobility  Activity Ambulated with assistance in hallway;Ambulated with assistance to bathroom  Level of Assistance Standby assist, set-up cues, supervision of patient - no hands on  Assistive Device Four wheel walker  Distance Ambulated (ft) 480 ft  Activity Response Tolerated well  Mobility Referral Yes  $Mobility charge 1 Mobility   Pt received in bed and agreeable to mobility. C/o R knee pain. Pt took 1 standing rest break (~30 sec) due to general fatigue. Pt to recliner after session with all needs met.     [facility]

## 2022-11-07 NOTE — Progress Notes (Signed)
Mobility Specialist - Progress Note   11/07/22 1552  Mobility  Activity Ambulated with assistance in hallway  Level of Assistance Standby assist, set-up cues, supervision of patient - no hands on  Assistive Device Four wheel walker  Distance Ambulated (ft) 480 ft  Activity Response Tolerated well  Mobility Referral Yes  $Mobility charge 1 Mobility   Pt received in bed and agreeable to mobility. No complaints during session. Pt to bed after session with all needs met.    [facility]

## 2022-11-08 MED ORDER — HYDROCODONE-ACETAMINOPHEN 5-325 MG PO TABS: 1.0000 | ORAL_TABLET | Freq: Four times a day (QID) | ORAL | 0 refills | Status: DC | PRN

## 2022-11-08 NOTE — Discharge Summary (Signed)
Physician Discharge Summary  Patient ID: Brooke Duncan MRN: [id number] DOB/AGE: Mar 10, 1946 76 y.o.  Admit date: 11/03/2022 Discharge date: 11/08/2022  Admission Diagnoses: Colon mass  Discharge Diagnoses: Adenomatous polyp of the ascending colon Principal Problem:   Colonic mass   Discharged Condition: good  Hospital Course: Patient was admitted to the med surg floor after surgery.  Diet was advanced as tolerated.  Patient began to have bowel function on postop day 3.  By postop day 5, she was tolerating a solid diet and pain was controlled with oral medications.  She was urinating without difficulty and ambulating without assistance.  Patient was felt to be in stable condition for discharge to home.   Consults: None  Significant Diagnostic Studies: labs: cbc, bmet  Treatments: analgesia: Vicodin, therapies: PT and OT, and surgery: robotic R colectomy  Discharge Exam: Blood pressure (!) 156/77, pulse 73, temperature 97.9 F (36.6 C), temperature source Oral, resp. rate 18, height 5' 5.5" (1.664 m), weight 126.6 kg, SpO2 93 %. General appearance: alert and cooperative GI: soft, nondistended Incision/Wound: C/D/I  Disposition: Discharge disposition: 06-Home-Health Care Svc        Allergies as of 11/08/2022       Reactions   Wound Dressing Adhesive Rash   Adhesive tape causes rash, can use papee tape   Tape Rash, Other (See Comments)   "Blistery rash" DERMABOND        Medication List     STOP taking these medications    traMADol 50 MG tablet Commonly known as: ULTRAM       TAKE these medications    acetaminophen 500 MG tablet Commonly known as: TYLENOL Take 2 tablets (1,000 mg total) by mouth every 6 (six) hours as needed.   albuterol 108 (90 Base) MCG/ACT inhaler Commonly known as: VENTOLIN HFA Inhale 2 puffs into the lungs every 4 (four) hours as needed for shortness of breath or wheezing.   cephALEXin 250 MG capsule Commonly known as:  KEFLEX Take 250 mg by mouth daily.   cholestyramine 4 g packet Commonly known as: Questran Take one pack PO BID   clotrimazole 1 % cream Commonly known as: LOTRIMIN Apply 1 application  topically 2 (two) times daily as needed (irritation).   CRANBERRY PO Take 2 capsules by mouth daily.   cyanocobalamin 1000 MCG tablet Commonly known as: VITAMIN B12 Take 1,000 mcg by mouth daily.   D-MANNOSE PO Take 4 capsules by mouth daily.   ergocalciferol 1.25 MG (50000 UT) capsule Commonly known as: VITAMIN D2 Take 50,000 Units by mouth once a week.   erythromycin ophthalmic ointment Place 1 application  into both eyes daily as needed (blepharitis).   HYDROcodone-acetaminophen 5-325 MG tablet Commonly known as: NORCO/VICODIN Take 1-2 tablets by mouth every 6 (six) hours as needed for moderate pain or severe pain.   hydrocortisone 2.5 % cream Apply 1 Application topically 2 (two) times daily as needed (irritation).   ibuprofen 600 MG tablet Commonly known as: ADVIL Take 1 tablet (600 mg total) by mouth every 6 (six) hours as needed.   letrozole 2.5 MG tablet Commonly known as: FEMARA TAKE 1 TABLET BY MOUTH EVERY DAY What changed: when to take this   levothyroxine 150 MCG tablet Commonly known as: SYNTHROID Take 150 mcg by mouth daily before breakfast.   losartan 50 MG tablet Commonly known as: COZAAR Take 50 mg by mouth daily.   oxybutynin 5 MG tablet Commonly known as: DITROPAN Take 1 tablet (5 mg total) by mouth  every 8 (eight) hours as needed for bladder spasms.   phenazopyridine 200 MG tablet Commonly known as: Pyridium Take 1 tablet (200 mg total) by mouth 3 (three) times daily as needed (for pain with urination).   PROBIOTIC DAILY PO Take 1 capsule by mouth daily. 1 tab daily   simvastatin 10 MG tablet Commonly known as: ZOCOR Take 10 mg by mouth at bedtime.   tamsulosin 0.4 MG Caps capsule Commonly known as: FLOMAX Take 1 capsule (0.4 mg total) by mouth  daily. What changed: when to take this   VITAMIN C PO Take 1 tablet by mouth daily.   Vitamin D3 Super Strength 50 MCG (2000 UT) Caps Generic drug: Cholecalciferol Take 2,000 Units by mouth daily.        Follow-up Information     Leighton Ruff, MD. Schedule an appointment as soon as possible for a visit in 2 week(s).   Specialties: General Surgery, Colon and Rectal Surgery Contact information: [place] [id number] [place], [facility] Follow up.   Specialty: Home Health Services Why: Brooke Duncan will provide PT and an aide in the home after discharge. Contact information: [place] [address] [phone]                 Signed: Rosario Adie 96/06/2896, 8:57 AM

## 2022-11-08 NOTE — Progress Notes (Signed)
Patient was given discharge instructions, and all questions were answered.  Patient was stable for discharge and was taken to the main exit by wheelchair. 

## 2022-11-08 NOTE — Progress Notes (Signed)
OT Cancellation Note  Patient Details Name: MALVA DIESING MRN: [id number] DOB: 1946-11-01   Cancelled Treatment:    Reason Eval/Treat Not Completed: Other (comment) Patient reported family member had ordered AE discussed in previous session. Patient reported she is ready to d/c home when able and did not want refresher on any compensatory strategies for ADLs today. OT to continue to follow from a distance.  Rennie Plowman, MS Acute Rehabilitation Department Office# [phone]  11/08/2022, 8:14 AM

## 2022-11-11 ENCOUNTER — Ambulatory Visit: Admit: 2022-11-11 | Payer: Medicare Other | Admitting: Orthopedic Surgery

## 2022-11-11 SURGERY — ARTHROPLASTY, SHOULDER, TOTAL, REVERSE
Anesthesia: General | Site: Shoulder | Laterality: Left

## 2023-01-09 ENCOUNTER — Encounter: Payer: Self-pay | Admitting: Hematology and Oncology

## 2023-01-09 ENCOUNTER — Other Ambulatory Visit: Payer: Self-pay | Admitting: *Deleted

## 2023-01-09 MED ORDER — LETROZOLE 2.5 MG PO TABS: 2.5000 mg | ORAL_TABLET | Freq: Every day | ORAL | 3 refills | Status: DC

## 2023-01-10 ENCOUNTER — Telehealth: Payer: Self-pay | Admitting: Hematology and Oncology

## 2023-01-10 NOTE — Telephone Encounter (Signed)
Scheduled appointment per staff message. Patient is aware.

## 2023-02-01 ENCOUNTER — Other Ambulatory Visit: Payer: Medicare Other

## 2023-02-02 NOTE — H&P (Signed)
Patient's anticipated LOS is less than 2 midnights, meeting these requirements: - Younger than 28 - Lives within 1 hour of care - Has a competent adult at home to recover with post-op recover - NO history of  - Chronic pain requiring opiods  - Diabetes  - Coronary Artery Disease  - Heart failure  - Heart attack  - Stroke  - DVT/VTE  - Cardiac arrhythmia  - Respiratory Failure/COPD  - Renal failure  - Anemia  - Advanced Liver disease     Brooke Duncan is an 77 y.o. female.    Chief Complaint: left shoulder pain  HPI: Pt is a 77 y.o. female complaining of left shoulder pain for multiple years. Pain had continually increased since the beginning. X-rays in the clinic show end-stage arthritic changes of the left shoulder. Pt has tried various conservative treatments which have failed to alleviate their symptoms, including injections and therapy. Various options are discussed with the patient. Risks, benefits and expectations were discussed with the patient. Patient understand the risks, benefits and expectations and wishes to proceed with surgery.   PCP:  Lawerance Cruel, MD  D/C Plans: Home  PMH: Past Medical History:  Diagnosis Date   Acquired solitary kidney    right side 2014   Arthritis    Complication of anesthesia    slow to wake, hard to take a deep breath with 1 surgery several yrs ago   Frequency of urination    Heart murmur    per told by pcp approx. 02/ 2022 heard a very faint murmur, told did need work-up done at this time   History of COVID-19 2021   all symptoms resolved   History of Hashimoto thyroiditis    s/p  total thyroidectomy 1995   History of hypercalcemia    s/p  parathryoidectomy 07/ 2021   History of renal cell carcinoma 2014   s/p  left nephrectomy, pt stated no other treatment   Hypertension    followed by pcp   Hypothyroidism, postsurgical 1995   followed by pcp in [place], moved from [place] 01/ 2021   OSA on CPAP    Thickened  endometrium    UTI (urinary tract infection)    finished keflex 08-12-2022   Wears glasses     PSH: Past Surgical History:  Procedure Laterality Date   BREAST BIOPSY Left 08/24/2021   BREAST LUMPECTOMY WITH RADIOACTIVE SEED LOCALIZATION Left 07/14/2022   Procedure: LEFT BREAST LUMPECTOMY WITH RADIOACTIVE SEED LOCALIZATION;  Surgeon: Rolm Bookbinder, MD;  Location: [place];  Service: General;  Laterality: Left;   CYSTOSCOPY  08/04/2022   in dr winter office   [place] N/A 09/02/2022   Procedure: CYSTOSCOPY WITH BLADDER BIOPSY;  Surgeon: Ceasar Mons, MD;  Location[facility];  Service: Urology;  Laterality: N/A;  ONLY NEEDS 30 MIN   HYSTEROSCOPY WITH D & C N/A 04/08/2021   Procedure: DILATATION AND CURETTAGE /HYSTEROSCOPY, EXCISION OF VAGINAL LESION;  Surgeon: Delsa Bern, MD;  Location: [place];  Service: Gynecology;  Laterality: N/A;   LAPAROSCOPIC CHOLECYSTECTOMY  2005   NEPHRECTOMY Left 2014   PARATHYROIDECTOMY  07/ 2021  in [place]  age 89   TOTAL HIP ARTHROPLASTY Bilateral left 2015;  right 2009   TOTAL KNEE ARTHROPLASTY Left 2014   TOTAL THYROIDECTOMY Bilateral 1995    Social History:  reports that she quit smoking about 48 years ago. Her smoking use included cigarettes. She  has a 18.00 pack-year smoking history. She has never used smokeless tobacco. She reports current alcohol use. She reports that she does not use drugs. BMI: Estimated body mass index is 45.74 kg/m as calculated from the following:   Height as of 11/03/22: 5' 5.5" (1.664 m).   Weight as of 11/08/22: 126.6 kg.  Lab Results  Component Value Date   ALBUMIN 3.7 09/01/2021   Diabetes: Patient does not have a diagnosis of diabetes.     Smoking Status:      Allergies:  Allergies  Allergen Reactions   Wound Dressing Adhesive Rash    Adhesive tape causes rash, can use papee tape   Tape Rash and Other  (See Comments)    "Blistery rash" DERMABOND    Medications: No current facility-administered medications for this encounter.   Current Outpatient Medications  Medication Sig Dispense Refill   acetaminophen (TYLENOL) 500 MG tablet Take 2 tablets (1,000 mg total) by mouth every 6 (six) hours as needed. 30 tablet 0   albuterol (VENTOLIN HFA) 108 (90 Base) MCG/ACT inhaler Inhale 2 puffs into the lungs every 4 (four) hours as needed for shortness of breath or wheezing.     Ascorbic Acid (VITAMIN C PO) Take 1 tablet by mouth daily.     cephALEXin (KEFLEX) 250 MG capsule Take 250 mg by mouth daily.     Cholecalciferol (VITAMIN D3 SUPER STRENGTH) 50 MCG (2000 UT) CAPS Take 2,000 Units by mouth daily.     cholestyramine (QUESTRAN) 4 g packet Take one pack PO BID 60 each 11   clotrimazole (LOTRIMIN) 1 % cream Apply 1 application  topically 2 (two) times daily as needed (irritation).     CRANBERRY PO Take 2 capsules by mouth daily.     D-MANNOSE PO Take 4 capsules by mouth daily.     ergocalciferol (VITAMIN D2) 1.25 MG (50000 UT) capsule Take 50,000 Units by mouth once a week. (Patient not taking: Reported on 10/28/2022)     erythromycin ophthalmic ointment Place 1 application  into both eyes daily as needed (blepharitis).     HYDROcodone-acetaminophen (NORCO/VICODIN) 5-325 MG tablet Take 1-2 tablets by mouth every 6 (six) hours as needed for moderate pain or severe pain. 20 tablet 0   hydrocortisone 2.5 % cream Apply 1 Application topically 2 (two) times daily as needed (irritation).     ibuprofen (ADVIL) 600 MG tablet Take 1 tablet (600 mg total) by mouth every 6 (six) hours as needed. 30 tablet 0   letrozole (FEMARA) 2.5 MG tablet Take 1 tablet (2.5 mg total) by mouth daily. 90 tablet 3   levothyroxine (SYNTHROID) 150 MCG tablet Take 150 mcg by mouth daily before breakfast.     losartan (COZAAR) 50 MG tablet Take 50 mg by mouth daily.     oxybutynin (DITROPAN) 5 MG tablet Take 1 tablet (5 mg total)  by mouth every 8 (eight) hours as needed for bladder spasms. (Patient not taking: Reported on 10/25/2022) 30 tablet 1   phenazopyridine (PYRIDIUM) 200 MG tablet Take 1 tablet (200 mg total) by mouth 3 (three) times daily as needed (for pain with urination). (Patient not taking: Reported on 10/25/2022) 30 tablet 0   Probiotic Product (PROBIOTIC DAILY PO) Take 1 capsule by mouth daily. 1 tab daily     simvastatin (ZOCOR) 10 MG tablet Take 10 mg by mouth at bedtime.     tamsulosin (FLOMAX) 0.4 MG CAPS capsule Take 1 capsule (0.4 mg total) by mouth daily. (Patient taking differently:  Take 0.4 mg by mouth every other day.) 30 capsule    vitamin B-12 (CYANOCOBALAMIN) 1000 MCG tablet Take 1,000 mcg by mouth daily.      No results found for this or any previous visit (from the past 48 hour(s)). No results found.  ROS: Pain with rom of the left upper extremity  Physical Exam: Alert and oriented 77 y.o. female in no acute distress Cranial nerves 2-12 intact Cervical spine: full rom with no tenderness, nv intact distally Chest: active breath sounds bilaterally, no wheeze rhonchi or rales Heart: regular rate and rhythm, no murmur Abd: non tender non distended with active bowel sounds Hip is stable with rom  Left shoulder painful and weak rom Nv intact distally No rashes or edema distally  Assessment/Plan Assessment: left shoulder cuff arthropathy  Plan:  Patient will undergo a left reverse total shoulder by Dr. Veverly Fells at [place] Risks benefits and expectations were discussed with the patient. Patient understand risks, benefits and expectations and wishes to proceed. Preoperative templating of the joint replacement has been completed, documented, and submitted to the Operating Room personnel in order to optimize intra-operative equipment management.   Merla Riches PA-C, MPAS [facility] is now [employer] [address], [place], [place], [place] [address] Phone:  [phone] [email or web address]  [employer]

## 2023-02-09 NOTE — Patient Instructions (Addendum)
SURGICAL WAITING ROOM VISITATION Patients having surgery or a procedure may have no more than 2 support people in the waiting area - these visitors may rotate in the visitor waiting room.   Due to an increase in RSV and influenza rates and associated hospitalizations, children ages 66 and under may not visit patients in [place]. If the patient needs to stay at the hospital during part of their recovery, the visitor guidelines for inpatient rooms apply.  PRE-OP VISITATION  Pre-op nurse will coordinate an appropriate time for 1 support person to accompany the patient in pre-op.  This support person may not rotate.  This visitor will be contacted when the time is appropriate for the visitor to come back in the pre-op area.  Please refer to the [facility] website for the visitor guidelines for Inpatients (after your surgery is over and you are in a regular room).  You are not required to quarantine at this time prior to your surgery. However, you must do this: Hand Hygiene often Do NOT share personal items Notify your provider if you are in close contact with someone who has COVID or you develop fever 100.4 or greater, new onset of sneezing, cough, sore throat, shortness of breath or body aches.  If you test positive for Covid or have been in contact with anyone that has tested positive in the last 10 days please notify you surgeon.    Your procedure is scheduled on:  Friday   February 17, 2023  Report to [facility] Main Entrance: [place] entrance where the [employer] is available.   Report to admitting at: 06:00 AM  +++++Call this number if you have any questions or problems the morning of surgery [phone]  Do not eat food after Midnight the night prior to your surgery/procedure.  After Midnight you may have the following liquids until 05:30 AM  DAY OF SURGERY  Clear Liquid Diet Water Black Coffee (sugar ok, NO MILK/CREAM OR CREAMERS)  Tea (sugar ok, NO  MILK/CREAM OR CREAMERS) regular and decaf                             Plain Jell-O  with no fruit (NO RED)                                           Fruit ices (not with fruit pulp, NO RED)                                     Popsicles (NO RED)                                                                  Juice: apple, WHITE grape, WHITE cranberry Sports drinks like Gatorade or Powerade (NO RED)                   The day of surgery:  Drink ONE (1) Pre-Surgery Clear Ensure at   05:30     AM the morning of surgery. Drink in  one sitting. Do not sip.  This drink was given to you during your hospital pre-op appointment visit. Nothing else to drink after completing the Pre-Surgery Clear Ensure : No candy, chewing gum or throat lozenges.    FOLLOW ANY ADDITIONAL PRE OP INSTRUCTIONS YOU RECEIVED FROM YOUR SURGEON'S OFFICE!!!   Oral Hygiene is also important to reduce your risk of infection.        Remember - BRUSH YOUR TEETH THE MORNING OF SURGERY WITH YOUR REGULAR TOOTHPASTE  Do NOT smoke after Midnight the night before surgery.  Take ONLY these medicines the morning of surgery with A SIP OF WATER: Levothyroxine ( Synthroid), cephalexin (Keflex) Letrozole (Femara).  If needed you may take Tylenol or Hydrocodone for pain. You may use your Albuterol inhaler if needed.    If You have been diagnosed with Sleep Apnea - Bring CPAP mask and tubing day of surgery. We will provide you with a CPAP machine on the day of your surgery.                   You may not have any metal on your body including hair pins, jewelry, and body piercing  Do not wear make-up, lotions, powders, perfumes or deodorant  Do not wear nail polish including gel and S&S, artificial / acrylic nails, or any other type of covering on natural nails including finger and toenails. If you have artificial nails, gel coating, etc., that needs to be removed by a nail salon, Please have this removed prior to surgery. Not doing so may  mean that your surgery could be cancelled or delayed if the Surgeon or anesthesia staff feels like they are unable to monitor you safely.   Do not shave 48 hours prior to surgery to avoid nicks in your skin which may contribute to postoperative infections.    You may bring a small overnight bag with you on the day of surgery, only pack items that are not valuable. [facility] IS NOT RESPONSIBLE   FOR VALUABLES THAT ARE LOST OR STOLEN.   Do not bring your home medications to the hospital. The Pharmacy will dispense medications listed on your medication list to you during your admission in the Hospital.  Special Instructions: Bring a copy of your healthcare power of attorney and living will documents the day of surgery, if you wish to have them scanned into your [facility] Records- EPIC  Please read over the following fact sheets you were given: IF YOU HAVE QUESTIONS ABOUT YOUR [place], [place] [phone].   [place] - Preparing for Surgery Before surgery, you can play an important role.  Because skin is not sterile, your skin needs to be as free of germs as possible.  You can reduce the number of germs on your skin by washing with CHG (chlorahexidine gluconate) soap before surgery.  CHG is an antiseptic cleaner which kills germs and bonds with the skin to continue killing germs even after washing. Please DO NOT use if you have an allergy to CHG or antibacterial soaps.  If your skin becomes reddened/irritated stop using the CHG and inform your nurse when you arrive at Short Stay. Do not shave (including legs and underarms) for at least 48 hours prior to the first CHG shower.  You may shave your face/neck.  Please follow these instructions carefully:  1.  Shower with CHG Soap the night before surgery and the  morning of surgery.  2.  If you choose to wash your  hair, wash your hair first as usual with your normal  shampoo.  3.  After you shampoo, rinse your hair and  body thoroughly to remove the shampoo.                             4.  Use CHG as you would any other liquid soap.  You can apply chg directly to the skin and wash.  Gently with a scrungie or clean washcloth.  5.  Apply the CHG Soap to your body ONLY FROM THE NECK DOWN.   Do not use on face/ open                           Wound or open sores. Avoid contact with eyes, ears mouth and genitals (private parts).                       Wash face,  Genitals (private parts) with your normal soap.             6.  Wash thoroughly, paying special attention to the area where your  surgery  will be performed.  7.  Thoroughly rinse your body with warm water from the neck down.  8.  DO NOT shower/wash with your normal soap after using and rinsing off the CHG Soap.            9.  Pat yourself dry with a clean towel.            10.  Wear clean pajamas.            11.  Place clean sheets on your bed the night of your first shower and do not  sleep with pets.  ON THE DAY OF SURGERY : Do not apply any lotions/deodorants the morning of surgery.  Please wear clean clothes to the hospital/surgery center.      Preparing for Total Shoulder Arthroplasty ================================================================= Please follow these instructions carefully, in addition to any other special Bathing information that was explained to you at the Presurgical Appointment:  BENZOYL PEROXIDE 5% GEL: Used to kill bacteria on the skin which could cause an infection at the surgery site.   Please do not use if you have an allergy to benzoyl peroxide. If your skin becomes reddened/irritated stop using the benzoyl peroxide and inform your Doctor.   Starting two days before surgery, apply as follows:  1. Apply benzoyl peroxide gel in the morning and at night. Apply after taking a shower. If you are not taking a shower, clean entire shoulder front, back, and side, along with the armpit with a clean wet washcloth.  2. Place  a quarter-sized dollop of the gel on your SHOULDER and rub in thoroughly, making sure to cover the front, back, and side of your shoulder, along with the armpit.   2 Days prior to Surgery     Wednesday  March 13, 123456 First Application _______ Morning Second Application _______ Night  Day Before Surgery           Thursday   March 14, 123456 First Application______ Morning  On the night before surgery, wash your entire body (except hair, face and private areas) with CHG Soap. THEN, rub in the LAST application of the Benzoyl Peroxide Gel on your shoulder.   3. On the Morning of Surgery wash your BODY AGAIN with CHG Soap (except hair, face  and private areas)  4. DO NOT USE THE BENZOYL PEROXIDE GEL ON THE DAY OF YOUR SURGERY       FAILURE TO FOLLOW THESE INSTRUCTIONS MAY RESULT IN THE CANCELLATION OF YOUR SURGERY  PATIENT SIGNATURE_________________________________  NURSE SIGNATURE__________________________________  ________________________________________________________________________       Adam Phenix    An incentive spirometer is a tool that can help keep your lungs clear and active. This tool measures how well you are filling your lungs with each breath. Taking long deep breaths may help reverse or decrease the chance of developing breathing (pulmonary) problems (especially infection) following: A long period of time when you are unable to move or be active. BEFORE THE PROCEDURE  If the spirometer includes an indicator to show your best effort, your nurse or respiratory therapist will set it to a desired goal. If possible, sit up straight or lean slightly forward. Try not to slouch. Hold the incentive spirometer in an upright position. INSTRUCTIONS FOR USE  Sit on the edge of your bed if possible, or sit up as far as you can in bed or on a chair. Hold the incentive spirometer in an upright position. Breathe out normally. Place the mouthpiece in your mouth and seal  your lips tightly around it. Breathe in slowly and as deeply as possible, raising the piston or the ball toward the top of the column. Hold your breath for 3-5 seconds or for as long as possible. Allow the piston or ball to fall to the bottom of the column. Remove the mouthpiece from your mouth and breathe out normally. Rest for a few seconds and repeat Steps 1 through 7 at least 10 times every 1-2 hours when you are awake. Take your time and take a few normal breaths between deep breaths. The spirometer may include an indicator to show your best effort. Use the indicator as a goal to work toward during each repetition. After each set of 10 deep breaths, practice coughing to be sure your lungs are clear. If you have an incision (the cut made at the time of surgery), support your incision when coughing by placing a pillow or rolled up towels firmly against it. Once you are able to get out of bed, walk around indoors and cough well. You may stop using the incentive spirometer when instructed by your caregiver.  RISKS AND COMPLICATIONS Take your time so you do not get dizzy or light-headed. If you are in pain, you may need to take or ask for pain medication before doing incentive spirometry. It is harder to take a deep breath if you are having pain. AFTER USE Rest and breathe slowly and easily. It can be helpful to keep track of a log of your progress. Your caregiver can provide you with a simple table to help with this. If you are using the spirometer at home, follow these instructions: [place] IF:  You are having difficultly using the spirometer. You have trouble using the spirometer as often as instructed. Your pain medication is not giving enough relief while using the spirometer. You develop fever of 100.5 F (38.1 C) or higher.  SEEK IMMEDIATE MEDICAL CARE IF:  You cough up bloody  sputum that had not been present before. You develop fever of 102 F (38.9 C) or greater. You develop worsening pain at or near the incision site. MAKE SURE YOU:  Understand these instructions. Will watch your condition. Will get help right away if you are not doing well or get worse. Document Released: 04/03/2007 Document Revised: 02/13/2012 Document Reviewed: 06/04/2007 [facility] Patient Information 2014 [place], [place].

## 2023-02-09 NOTE — Progress Notes (Addendum)
COVID Vaccine received:  '[]'$  No '[x]'$  Yes Date of any COVID positive Test in last 90 days:  None  PCP - Mardene Sayer, MD [phone] Cardiologist - None  Chest x-ray - 05-26-2022  2v Epic EKG -  07-11-2022  epic Stress Test -  ECHO -  Cardiac Cath -   PCR screen: '[x]'$  Ordered & Completed                      '[]'$   No Order but Needs PROFEND                      '[]'$   N/A for this surgery  Surgery Plan:  '[]'$  Ambulatory                            '[x]'$  Outpatient in bed                            '[]'$  Admit  Anesthesia:    '[]'$  General  '[]'$  Spinal                           '[x]'$   Choice '[]'$   MAC  Pacemaker / ICD device '[x]'$  No '[]'$  Yes        Device order form faxed '[x]'$  No    '[]'$   Yes      Faxed to:  Spinal Cord Stimulator:'[x]'$  No '[]'$  Yes      (Remind patient to bring remote DOS) Other Implants:   History of Sleep Apnea? '[]'$  No '[x]'$  Yes   CPAP used?- '[]'$  No '[x]'$  Yes    Does the patient monitor blood sugar? '[]'$  No '[]'$  Yes  '[x]'$  N/A  Blood Thinner / Instructions:  none Aspirin Instructions:  none  ERAS Protocol Ordered: '[]'$  No  '[x]'$  Yes PRE-SURGERY '[x]'$  ENSURE  '[]'$  G2  Patient is to be NPO after: 05:30 am  The patient was given Benzoyl peroxide Gel as ordered. Instruction regarding application starting 2 days prior to surgery was given and patient voiced understanding.   Activity level: Can perform activities of daily living without stopping and without symptoms of chest pain or shortness of breath. Does not do stairs due to mobility and weight    Anesthesia review: HTN, OSA-CPAP, s/p left nephrectomy- 2014, "slow to wake up", Heart murmur - no followup  Patient denies shortness of breath, fever, cough and chest pain at PAT appointment.  Patient verbalized understanding and agreement to the Pre-Surgical Instructions that were given to them at this PAT appointment. Patient was also educated of the need to review these PAT instructions again prior to her surgery.I reviewed the appropriate phone  numbers to call if they have any and questions or concerns.

## 2023-02-10 ENCOUNTER — Encounter (HOSPITAL_COMMUNITY)
Admission: RE | Admit: 2023-02-10 | Discharge: 2023-02-10 | Disposition: A | Discharge status: Home / Self Care | Payer: Medicare Other | Source: Ambulatory Visit | Attending: Orthopedic Surgery | Admitting: Orthopedic Surgery

## 2023-02-10 ENCOUNTER — Encounter (HOSPITAL_COMMUNITY): Payer: Self-pay

## 2023-02-10 ENCOUNTER — Other Ambulatory Visit: Payer: Self-pay

## 2023-02-10 VITALS — BP 133/64 | HR 74 | Temp 98.1°F | Resp 20 | Ht 65.5 in | Wt 253.0 lb

## 2023-02-10 DIAGNOSIS — Z01818 Encounter for other preprocedural examination: Secondary | ICD-10-CM

## 2023-02-10 DIAGNOSIS — Z01812 Encounter for preprocedural laboratory examination: Secondary | ICD-10-CM | POA: Insufficient documentation

## 2023-02-10 DIAGNOSIS — I10 Essential (primary) hypertension: Secondary | ICD-10-CM | POA: Insufficient documentation

## 2023-02-10 DIAGNOSIS — G4733 Obstructive sleep apnea (adult) (pediatric): Secondary | ICD-10-CM | POA: Insufficient documentation

## 2023-02-10 DIAGNOSIS — Z87891 Personal history of nicotine dependence: Secondary | ICD-10-CM | POA: Insufficient documentation

## 2023-02-10 DIAGNOSIS — M19012 Primary osteoarthritis, left shoulder: Secondary | ICD-10-CM | POA: Insufficient documentation

## 2023-02-10 HISTORY — DX: Headache, unspecified: R51.9

## 2023-02-10 HISTORY — DX: Malignant (primary) neoplasm, unspecified: C80.1

## 2023-02-10 LAB — SURGICAL PCR SCREEN
MRSA, PCR: NEGATIVE
Staphylococcus aureus: NEGATIVE

## 2023-02-13 ENCOUNTER — Encounter (HOSPITAL_COMMUNITY): Payer: Self-pay | Admitting: Physician Assistant

## 2023-02-13 NOTE — Progress Notes (Signed)
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 former smoker with h/o HTN, OSA on CPAP, s/p left nephrectomy, left shoulder OA scheduled for above procedure 02/17/2023 with Dr. Netta Cedars.   Pt reports she has been told she has a heart murmur.  Murmur noted in previous anesthesia notes. 2/6 systolic murmur noted in previous H&P notes.  Pt has never had an echo.  Requested echo prior to upcoming shoulder surgery.  VS: BP 133/64 Comment: right arm sitting  Pulse 74   Temp 36.7 C (Oral)   Resp 20   Ht 5' 5.5" (1.664 m)   Wt 114.8 kg   SpO2 100%   BMI 41.46 kg/m   PROVIDERS: Lawerance Cruel, MD is PCP    LABS: Labs reviewed: Acceptable for surgery. (all labs ordered are listed, but only abnormal results are displayed)  Labs Reviewed  SURGICAL PCR SCREEN     IMAGES:   EKG:   CV:  Past Medical History:  Diagnosis Date   Acquired solitary kidney    right side 2014   Arthritis    Cancer ([place])    renal cell carcinoma- left kidney removed   Complication of anesthesia    slow to wake, hard to take a deep breath with 1 surgery several yrs ago   Frequency of urination    Headache    Heart murmur    per told by pcp approx. 02/ 2022 heard a very faint murmur, told did need work-up done at this time   History of COVID-19 2021   all symptoms resolved   History of Hashimoto thyroiditis    s/p  total thyroidectomy 1995   History of hypercalcemia    s/p  parathryoidectomy 07/ 2021   History of renal cell carcinoma 2014   s/p  left nephrectomy, pt stated no other treatment   Hypertension    followed by pcp   Hypothyroidism, postsurgical 1995   followed by pcp in [place], moved from [place] 01/ 2021   OSA on  CPAP    Thickened endometrium    UTI (urinary tract infection)    finished keflex 08-12-2022   Wears glasses     Past Surgical History:  Procedure Laterality Date   BREAST BIOPSY Left 08/24/2021   BREAST LUMPECTOMY WITH RADIOACTIVE SEED LOCALIZATION Left 07/14/2022   Procedure: LEFT BREAST LUMPECTOMY WITH RADIOACTIVE SEED LOCALIZATION;  Surgeon: Rolm Bookbinder, MD;  Location: [facility];  Service: General;  Laterality: Left;   CYSTOSCOPY  08/04/2022   in dr winter office   [place] N/A 09/02/2022   Procedure: CYSTOSCOPY WITH BLADDER BIOPSY;  Surgeon: Ceasar Mons, MD;  Location[facility];  Service: Urology;  Laterality: N/A;  ONLY NEEDS 30 MIN   HYSTEROSCOPY WITH D & C N/A 04/08/2021   Procedure: DILATATION AND CURETTAGE /HYSTEROSCOPY, EXCISION OF VAGINAL LESION;  Surgeon: Delsa Bern, MD;  Location: [facility];  Service: Gynecology;  Laterality: N/A;   LAPAROSCOPIC CHOLECYSTECTOMY  2005   NEPHRECTOMY Left 2014   PARATHYROIDECTOMY  07/ 2021  in [place]  age 77   TOTAL HIP ARTHROPLASTY Bilateral left 2015;  right 2009   TOTAL KNEE ARTHROPLASTY Left  2014   TOTAL THYROIDECTOMY Bilateral 1995    MEDICATIONS:  acetaminophen (TYLENOL) 500 MG tablet   albuterol (VENTOLIN HFA) 108 (90 Base) MCG/ACT inhaler   Ascorbic Acid (VITAMIN C PO)   cephALEXin (KEFLEX) 250 MG capsule   Cholecalciferol (VITAMIN D3 SUPER STRENGTH) 50 MCG (2000 UT) CAPS   cholestyramine (QUESTRAN) 4 g packet   clotrimazole (LOTRIMIN) 1 % cream   CRANBERRY PO   D-MANNOSE PO   erythromycin ophthalmic ointment   HYDROcodone-acetaminophen (NORCO/VICODIN) 5-325 MG tablet   hydrocortisone 2.5 % cream   ibuprofen (ADVIL) 600 MG tablet   letrozole (FEMARA) 2.5 MG tablet   levothyroxine (SYNTHROID) 150 MCG tablet   losartan (COZAAR) 50 MG tablet   NON FORMULARY   oxybutynin (DITROPAN) 5 MG tablet   phenazopyridine  (PYRIDIUM) 200 MG tablet   Probiotic Product (PROBIOTIC DAILY PO)   simvastatin (ZOCOR) 10 MG tablet   tamsulosin (FLOMAX) 0.4 MG CAPS capsule   vitamin B-12 (CYANOCOBALAMIN) 1000 MCG tablet   No current facility-administered medications for this encounter.     Konrad Felix Ward, PA-C [facility]-Surgical Testing [phone]

## 2023-02-14 ENCOUNTER — Other Ambulatory Visit: Payer: Self-pay | Admitting: Family Medicine

## 2023-02-14 DIAGNOSIS — R011 Cardiac murmur, unspecified: Secondary | ICD-10-CM

## 2023-03-29 ENCOUNTER — Ambulatory Visit: Payer: Medicare Other

## 2023-03-29 DIAGNOSIS — R011 Cardiac murmur, unspecified: Secondary | ICD-10-CM

## 2023-04-12 NOTE — H&P (Signed)
Patient's anticipated LOS is less than 2 midnights, meeting these requirements: - Younger than 6 - Lives within 1 hour of care - Has a competent adult at home to recover with post-op recover - NO history of  - Chronic pain requiring opiods  - Diabetes  - Coronary Artery Disease  - Heart failure  - Heart attack  - Stroke  - DVT/VTE  - Cardiac arrhythmia  - Respiratory Failure/COPD  - Renal failure  - Anemia  - Advanced Liver disease     Brooke Duncan is an 77 y.o. female.    Chief Complaint: left shoulder pain  HPI: Pt is a 77 y.o. female complaining of left shoulder pain for multiple years. Pain had continually increased since the beginning. X-rays in the clinic show end-stage arthritic changes of the left shoulder. Pt has tried various conservative treatments which have failed to alleviate their symptoms, including injections and therapy. Various options are discussed with the patient. Risks, benefits and expectations were discussed with the patient. Patient understand the risks, benefits and expectations and wishes to proceed with surgery.   PCP:  Daisy Floro, MD  D/C Plans: Home  PMH: Past Medical History:  Diagnosis Date   Acquired solitary kidney    right side 2014   Arthritis    Cancer (HCC)    renal cell carcinoma- left kidney removed   Complication of anesthesia    slow to wake, hard to take a deep breath with 1 surgery several yrs ago   Frequency of urination    Headache    Heart murmur    per told by pcp approx. 02/ 2022 heard a very faint murmur, told did need work-up done at this time   History of COVID-19 2021   all symptoms resolved   History of Hashimoto thyroiditis    s/p  total thyroidectomy 1995   History of hypercalcemia    s/p  parathryoidectomy 07/ 2021   History of renal cell carcinoma 2014   s/p  left nephrectomy, pt stated no other treatment   Hypertension    followed by pcp   Hypothyroidism, postsurgical 1995   followed by  pcp in [place], moved from [place] 01/ 2021   OSA on CPAP    Thickened endometrium    UTI (urinary tract infection)    finished keflex 08-12-2022   Wears glasses     PSH: Past Surgical History:  Procedure Laterality Date   BREAST BIOPSY Left 08/24/2021   BREAST LUMPECTOMY WITH RADIOACTIVE SEED LOCALIZATION Left 07/14/2022   Procedure: LEFT BREAST LUMPECTOMY WITH RADIOACTIVE SEED LOCALIZATION;  Surgeon: Emelia Loron, MD;  Location: [facility];  Service: General;  Laterality: Left;   CYSTOSCOPY  08/04/2022   in dr winter office   CYSTOSCOPY WITH BIOPSY N/A 09/02/2022   Procedure: CYSTOSCOPY WITH BLADDER BIOPSY;  Surgeon: Rene Paci, MD;  Location: [facility];  Service: Urology;  Laterality: N/A;  ONLY NEEDS 30 MIN   HYSTEROSCOPY WITH D & C N/A 04/08/2021   Procedure: DILATATION AND CURETTAGE /HYSTEROSCOPY, EXCISION OF VAGINAL LESION;  Surgeon: Silverio Lay, MD;  Location: [facility];  Service: Gynecology;  Laterality: N/A;   LAPAROSCOPIC CHOLECYSTECTOMY  2005   NEPHRECTOMY Left 2014   PARATHYROIDECTOMY  07/ 2021  in [place]   TONSILLECTOMY  age 70   TOTAL HIP ARTHROPLASTY Bilateral left 2015;  right 2009   TOTAL KNEE ARTHROPLASTY Left 2014   TOTAL THYROIDECTOMY Bilateral 1995    Social  History:  reports that she quit smoking about 49 years ago. Her smoking use included cigarettes. She has a 18.00 pack-year smoking history. She has never used smokeless tobacco. She reports current alcohol use. She reports that she does not use drugs. BMI: Estimated body mass index is 41.46 kg/m as calculated from the following:   Height as of 02/10/23: 5' 5.5" (1.664 m).   Weight as of 02/10/23: 114.8 kg.  Lab Results  Component Value Date   ALBUMIN 3.7 09/01/2021   Diabetes: Patient does not have a diagnosis of diabetes.     Smoking Status:      Allergies:  Allergies  Allergen Reactions   Wound Dressing Adhesive Rash     Adhesive tape causes rash, can use papee tape   Tape Rash and Other (See Comments)    "Blistery rash" DERMABOND    Medications: No current facility-administered medications for this encounter.   Current Outpatient Medications  Medication Sig Dispense Refill   acetaminophen (TYLENOL) 500 MG tablet Take 2 tablets (1,000 mg total) by mouth every 6 (six) hours as needed. 30 tablet 0   albuterol (VENTOLIN HFA) 108 (90 Base) MCG/ACT inhaler Inhale 2 puffs into the lungs every 4 (four) hours as needed for shortness of breath or wheezing.     Ascorbic Acid (VITAMIN C PO) Take 1 tablet by mouth daily.     cephALEXin (KEFLEX) 250 MG capsule Take 250 mg by mouth daily.     Cholecalciferol (VITAMIN D3 SUPER STRENGTH) 50 MCG (2000 UT) CAPS Take 2,000 Units by mouth daily.     clotrimazole (LOTRIMIN) 1 % cream Apply 1 application  topically 2 (two) times daily as needed (irritation).     CRANBERRY PO Take 2 capsules by mouth daily.     D-MANNOSE PO Take 4 capsules by mouth daily.     erythromycin ophthalmic ointment Place 1 application  into both eyes daily as needed (blepharitis).     HYDROcodone-acetaminophen (NORCO/VICODIN) 5-325 MG tablet Take 1-2 tablets by mouth every 6 (six) hours as needed for moderate pain or severe pain. 20 tablet 0   hydrocortisone 2.5 % cream Apply 1 Application topically 2 (two) times daily as needed (irritation).     letrozole (FEMARA) 2.5 MG tablet Take 1 tablet (2.5 mg total) by mouth daily. 90 tablet 3   levothyroxine (SYNTHROID) 150 MCG tablet Take 150 mcg by mouth daily before breakfast.     losartan (COZAAR) 50 MG tablet Take 50 mg by mouth daily.     NON FORMULARY Pt uses a cpap nightly     Probiotic Product (PROBIOTIC DAILY PO) Take 1 capsule by mouth daily.     simvastatin (ZOCOR) 10 MG tablet Take 10 mg by mouth at bedtime.     tamsulosin (FLOMAX) 0.4 MG CAPS capsule Take 1 capsule (0.4 mg total) by mouth daily. (Patient taking differently: Take 0.4 mg by mouth  every other day.) 30 capsule    vitamin B-12 (CYANOCOBALAMIN) 1000 MCG tablet Take 1,000 mcg by mouth daily.     cholestyramine (QUESTRAN) 4 g packet Take one pack PO BID (Patient not taking: Reported on 02/08/2023) 60 each 11   ibuprofen (ADVIL) 600 MG tablet Take 1 tablet (600 mg total) by mouth every 6 (six) hours as needed. (Patient not taking: Reported on 02/08/2023) 30 tablet 0   oxybutynin (DITROPAN) 5 MG tablet Take 1 tablet (5 mg total) by mouth every 8 (eight) hours as needed for bladder spasms. (Patient not taking: Reported on  10/25/2022) 30 tablet 1   phenazopyridine (PYRIDIUM) 200 MG tablet Take 1 tablet (200 mg total) by mouth 3 (three) times daily as needed (for pain with urination). (Patient not taking: Reported on 10/25/2022) 30 tablet 0    No results found for this or any previous visit (from the past 48 hour(s)). No results found.  ROS: Pain with rom of the left upper extremity  Physical Exam: Alert and oriented 77 y.o. female in no acute distress Cranial nerves 2-12 intact Cervical spine: full rom with no tenderness, nv intact distally Chest: active breath sounds bilaterally, no wheeze rhonchi or rales Heart: regular rate and rhythm, no murmur Abd: non tender non distended with active bowel sounds Hip is stable with rom  Left shoulder painful and weak rom Nv intact distally No rashes or edema distally  Assessment/Plan Assessment: left shoulder cuff arthropathy  Plan:  Patient will undergo a left reverse total shoulder by Dr. Ranell Patrick at [facility] Risks benefits and expectations were discussed with the patient. Patient understand risks, benefits and expectations and wishes to proceed. Preoperative templating of the joint replacement has been completed, documented, and submitted to the Operating Room personnel in order to optimize intra-operative equipment management.   Alphonsa Overall PA-C, MPAS [facility] is now [employer] [address]  [address], [place], [place] [address] Phone: [phone] [email or web address] Facebook  [employer]

## 2023-04-21 NOTE — Progress Notes (Addendum)
COVID Vaccine received:  []  No [x]  Yes Date of any COVID positive Test in last 90 days:   PCP - Daisy Floro MD Cardiologist -    Chest x-ray - 05/26/22  EPIC EKG -  07/11/22 Epic Stress Test -  ECHO - 03/29/23  EPIC Cardiac Cath -    Bowel Prep - [x]  No  []   Yes ______   Pacemaker / ICD device [x]  No []  Yes   Spinal Cord Stimulator:[]  No []  Yes       History of Sleep Apnea? []  No []  Yes   CPAP used?- []  No []  Yes     Does the patient monitor blood sugar?          []  No []  Yes  []  N/A   Patient has: []  NO Hx DM   []  Pre-DM                 []  DM1  []   DM2 Does patient have a [employer] or Dexacom? []  No []  Yes   Fasting Blood Sugar Ranges-  Checks Blood Sugar _____ times a day   GLP1 agonist / usual dose -  GLP1 instructions:  SGLT-2 inhibitors / usual dose -  SGLT-2 instructions:    Blood Thinner / Instructions: Aspirin Instructions:   Comments:    Activity level: Patient is able / unable to climb a flight of stairs without difficulty; []  No CP  []  No SOB, but would have ___   Patient can / can not perform ADLs without assistance.    Anesthesia review:    Patient denies shortness of breath, fever, cough and chest pain at PAT appointment.   Patient verbalized understanding and agreement to the Pre-Surgical Instructions that were given to them at this PAT appointment. Patient was also educated of the need to review these PAT instructions again prior to his/her surgery.I reviewed the appropriate phone numbers to call if they have any and questions or concern

## 2023-04-21 NOTE — Patient Instructions (Addendum)
SURGICAL WAITING ROOM VISITATION  Patients having surgery or a procedure may have no more than 2 support people in the waiting area - these visitors may rotate.    Children under the age of 37 must have an adult with them who is not the patient.  Due to an increase in RSV and influenza rates and associated hospitalizations, children ages 45 and under may not visit patients in [facility] hospitals.  If the patient needs to stay at the hospital during part of their recovery, the visitor guidelines for inpatient rooms apply. Pre-op nurse will coordinate an appropriate time for 1 support person to accompany patient in pre-op.  This support person may not rotate.    Please refer to the [facility] website for the visitor guidelines for Inpatients (after your surgery is over and you are in a regular room).       Your procedure is scheduled on: 05/05/23   Report to [facility] Main Entrance    Report to admitting at  11 AM   Call this number if you have problems the morning of surgery [phone]   Do not eat food :After Midnight.   After Midnight you may have the following liquids until 10:30 AM DAY OF SURGERY  Water Non-Citrus Juices (without pulp, NO RED-Apple, White grape, White cranberry) Black Coffee (NO MILK/CREAM OR CREAMERS, sugar ok)  Clear Tea (NO MILK/CREAM OR CREAMERS, sugar ok) regular and decaf                             Plain Jell-O (NO RED)                                           Fruit ices (not with fruit pulp, NO RED)                                     Popsicles (NO RED)                                                               Sports drinks like Gatorade (NO RED)                The day of surgery:  Drink ONE (1) Pre-Surgery Clear Ensure at 10:30 AM the morning of surgery. Drink in one sitting. Do not sip.  This drink was given to you during your hospital  pre-op appointment visit. Nothing else to drink after completing the  Pre-Surgery Clear  Ensure          If you have questions, please contact your surgeon's office.   FOLLOW BOWEL PREP AND ANY ADDITIONAL PRE OP INSTRUCTIONS YOU RECEIVED FROM YOUR SURGEON'S OFFICE!!!     Oral Hygiene is also important to reduce your risk of infection.                                    Remember - BRUSH YOUR TEETH THE MORNING OF SURGERY WITH YOUR REGULAR TOOTHPASTE  DENTURES WILL BE REMOVED PRIOR TO SURGERY PLEASE DO NOT APPLY "Poly grip" OR ADHESIVES!!!      Take these medicines the morning of surgery with A SIP OF WATER: Tylenol, Keflex, Hydrocodone-acetaminophen, Levothyroxine,Letrozole  Bring CPAP mask and tubing day of surgery.                              You may not have any metal on your body including hair pins, jewelry, and body piercing             Do not wear make-up, lotions, powders, perfumes, or deodorant  Do not wear nail polish including gel and S&S, artificial/acrylic nails, or any other type of covering on natural nails including finger and toenails. If you have artificial nails, gel coating, etc. that needs to be removed by a nail salon please have this removed prior to surgery or surgery may need to be canceled/ delayed if the surgeon/ anesthesia feels like they are unable to be safely monitored.   Do not shave  48 hours prior to surgery.    Do not bring valuables to the hospital. [facility] IS NOT             RESPONSIBLE   FOR VALUABLES.   Contacts, glasses, dentures or bridgework may not be worn into surgery.   Bring small overnight bag day of surgery.   DO NOT BRING YOUR HOME MEDICATIONS TO THE HOSPITAL. PHARMACY WILL DISPENSE MEDICATIONS LISTED ON YOUR MEDICATION LIST TO YOU DURING YOUR ADMISSION IN THE HOSPITAL!    Patients discharged on the day of surgery will not be allowed to drive home.  Someone NEEDS to stay with you for the first 24 hours after anesthesia.   Special Instructions: Bring a copy of your healthcare power of attorney and living will  documents the day of surgery if you haven't scanned them before.              Please read over the following fact sheets you were given: IF YOU HAVE QUESTIONS ABOUT YOUR PRE-OP INSTRUCTIONS PLEASE CALL [phone] [place]   If you received a COVID test during your pre-op visit  it is requested that you wear a mask when out in public, stay away from anyone that may not be feeling well and notify your surgeon if you develop symptoms. If you test positive for Covid or have been in contact with anyone that has tested positive in the last 10 days please notify you surgeon.    Pre-operative 5 CHG Bath Instructions   You can play a key role in reducing the risk of infection after surgery. Your skin needs to be as free of germs as possible. You can reduce the number of germs on your skin by washing with CHG (chlorhexidine gluconate) soap before surgery. CHG is an antiseptic soap that kills germs and continues to kill germs even after washing.   DO NOT use if you have an allergy to chlorhexidine/CHG or antibacterial soaps. If your skin becomes reddened or irritated, stop using the CHG and notify one of our RNs at [phone].   Please shower with the CHG soap starting 4 days before surgery using the following schedule:     Please keep in mind the following:  DO NOT shave, including legs and underarms, starting the day of your first shower.   You may shave your face at any point before/day of surgery.  Place clean sheets on  your bed the day you start using CHG soap. Use a clean washcloth (not used since being washed) for each shower. DO NOT sleep with pets once you start using the CHG.   CHG Shower Instructions:  If you choose to wash your hair and private area, wash first with your normal shampoo/soap.  After you use shampoo/soap, rinse your hair and body thoroughly to remove shampoo/soap residue.  Turn the water OFF and apply about 3 tablespoons (45 ml) of CHG soap to a CLEAN washcloth.  Apply  CHG soap ONLY FROM YOUR NECK DOWN TO YOUR TOES (washing for 3-5 minutes)  DO NOT use CHG soap on face, private areas, open wounds, or sores.  Pay special attention to the area where your surgery is being performed.  If you are having back surgery, having someone wash your back for you may be helpful. Wait 2 minutes after CHG soap is applied, then you may rinse off the CHG soap.  Pat dry with a clean towel  Put on clean clothes/pajamas   If you choose to wear lotion, please use ONLY the CHG-compatible lotions on the back of this paper.     Additional instructions for the day of surgery: DO NOT APPLY any lotions, deodorants, cologne, or perfumes.   Put on clean/comfortable clothes.  Brush your teeth.  Ask your nurse before applying any prescription medications to the skin.      CHG Compatible Lotions   Aveeno Moisturizing lotion  Cetaphil Moisturizing Cream  Cetaphil Moisturizing Lotion  Clairol Herbal Essence Moisturizing Lotion, Dry Skin  Clairol Herbal Essence Moisturizing Lotion, Extra Dry Skin  Clairol Herbal Essence Moisturizing Lotion, Normal Skin  Curel Age Defying Therapeutic Moisturizing Lotion with Alpha Hydroxy  Curel Extreme Care Body Lotion  Curel Soothing Hands Moisturizing Hand Lotion  Curel Therapeutic Moisturizing Cream, Fragrance-Free  Curel Therapeutic Moisturizing Lotion, Fragrance-Free  Curel Therapeutic Moisturizing Lotion, Original Formula  Eucerin Daily Replenishing Lotion  Eucerin Dry Skin Therapy Plus Alpha Hydroxy Crme  Eucerin Dry Skin Therapy Plus Alpha Hydroxy Lotion  Eucerin Original Crme  Eucerin Original Lotion  Eucerin Plus Crme Eucerin Plus Lotion  Eucerin TriLipid Replenishing Lotion  Keri Anti-Bacterial Hand Lotion  Keri Deep Conditioning Original Lotion Dry Skin Formula Softly Scented  Keri Deep Conditioning Original Lotion, Fragrance Free Sensitive Skin Formula  Keri Lotion Fast Absorbing Fragrance Free Sensitive Skin Formula  Keri  Lotion Fast Absorbing Softly Scented Dry Skin Formula  Keri Original Lotion  Keri Skin Renewal Lotion Keri Silky Smooth Lotion  Keri Silky Smooth Sensitive Skin Lotion  Nivea Body Creamy Conditioning Oil  Nivea Body Extra Enriched Lotion  Nivea Body Original Lotion  Nivea Body Sheer Moisturizing Lotion Nivea Crme  Nivea Skin Firming Lotion  NutraDerm 30 Skin Lotion  NutraDerm Skin Lotion  NutraDerm Therapeutic Skin Cream  NutraDerm Therapeutic Skin Lotion  ProShield Protective Hand Cream  Provon moisturizing lotion     Incentive Spirometer (Watch this video at home: [email or web address])  An incentive spirometer is a tool that can help keep your lungs clear and active. This tool measures how well you are filling your lungs with each breath. Taking long deep breaths may help reverse or decrease the chance of developing breathing (pulmonary) problems (especially infection) following: A long period of time when you are unable to move or be active. BEFORE THE PROCEDURE  If the spirometer includes an indicator to show your best effort, your nurse or respiratory therapist will set it to a desired goal. If possible, sit  up straight or lean slightly forward. Try not to slouch. Hold the incentive spirometer in an upright position. INSTRUCTIONS FOR USE  Sit on the edge of your bed if possible, or sit up as far as you can in bed or on a chair. Hold the incentive spirometer in an upright position. Breathe out normally. Place the mouthpiece in your mouth and seal your lips tightly around it. Breathe in slowly and as deeply as possible, raising the piston or the ball toward the top of the column. Hold your breath for 3-5 seconds or for as long as possible. Allow the piston or ball to fall to the bottom of the column. Remove the mouthpiece from your mouth and breathe out normally. Rest for a few seconds and repeat Steps 1 through 7 at least 10 times every 1-2 hours when you  are awake. Take your time and take a few normal breaths between deep breaths. The spirometer may include an indicator to show your best effort. Use the indicator as a goal to work toward during each repetition. After each set of 10 deep breaths, practice coughing to be sure your lungs are clear. If you have an incision (the cut made at the time of surgery), support your incision when coughing by placing a pillow or rolled up towels firmly against it. Once you are able to get out of bed, walk around indoors and cough well. You may stop using the incentive spirometer when instructed by your caregiver.  RISKS AND COMPLICATIONS Take your time so you do not get dizzy or light-headed. If you are in pain, you may need to take or ask for pain medication before doing incentive spirometry. It is harder to take a deep breath if you are having pain. AFTER USE Rest and breathe slowly and easily. It can be helpful to keep track of a log of your progress. Your caregiver can provide you with a simple table to help with this. If you are using the spirometer at home, follow these instructions: SEEK MEDICAL CARE IF:  You are having difficultly using the spirometer. You have trouble using the spirometer as often as instructed. Your pain medication is not giving enough relief while using the spirometer. You develop fever of 100.5 F (38.1 C) or higher. SEEK IMMEDIATE MEDICAL CARE IF:  You cough up bloody sputum that had not been present before. You develop fever of 102 F (38.9 C) or greater. You develop worsening pain at or near the incision site. MAKE SURE YOU:  Understand these instructions. Will watch your condition. Will get help right away if you are not doing well or get worse. Document Released: 04/03/2007 Document Revised: 02/13/2012 Document Reviewed: 06/04/2007 [facility] Patient Information 2014 [place], [place]- Preparing for Total Shoulder Arthroplasty    Before surgery, you can play  an important role. Because skin is not sterile, your skin needs to be as free of germs as possible. You can reduce the number of germs on your skin by using the following products. Benzoyl Peroxide Gel Reduces the number of germs present on the skin Applied twice a day to shoulder area starting two days before surgery    ==================================================================  Please follow these instructions carefully:  BENZOYL PEROXIDE 5% GEL  Please do not use if you have an allergy to benzoyl peroxide.   If your skin becomes reddened/irritated stop using the benzoyl peroxide.  Starting two days before surgery, apply as follows: Apply benzoyl peroxide in the morning and at night. Apply after  taking a shower. If you are not taking a shower clean entire shoulder front, back, and side along with the armpit with a clean wet washcloth.  Place a quarter-sized dollop on your shoulder and rub in thoroughly, making sure to cover the front, back, and side of your shoulder, along with the armpit.   2 days before ____ AM   ____ PM              1 day before ____ AM   ____ PM                         Do this twice a day for two days.  (Last application is the night before surgery, AFTER using the CHG soap as described below).  Do NOT apply benzoyl peroxide gel on the day of surgery.

## 2023-04-21 NOTE — Progress Notes (Signed)
COVID Vaccine received:  []  No [x]  Yes Date of any COVID positive Test in last 32 days:No  PCP - Daisy Floro MD Cardiologist - No  Chest x-ray - 05/26/22  EPIC EKG -  07/11/22 Epic Stress Test - No ECHO - 03/29/23  EPIC Cardiac Cath - No  Bowel Prep - [x]  No  []   Yes ______  Pacemaker / ICD device [x]  No []  Yes   Spinal Cord Stimulator:[x]  No []  Yes       History of Sleep Apnea? []  No [x]  Yes   CPAP used?- []  No [x]  Yes    Does the patient monitor blood sugar?          [x]  No []  Yes  []  N/A  Patient has: [x]  NO Hx DM   []  Pre-DM                 []  DM1  []   DM2 Does patient have a [employer] or Dexacom? [x]  No []  Yes   Fasting Blood Sugar Ranges-  Checks Blood Sugar _____ times a day  GLP1 agonist / usual dose - No SGLT-2 inhibitors / usual dose -  SGLT-2 instructions: No  Blood Thinner / Instructions:No Aspirin Instructions:No  Comments:   Activity level: Patient is able  to climb a flight of stairs without difficulty; [x]  No CP  [x]  No SOB, but would have _Right knee pain__   Patient can perform ADLs without assistance.   Anesthesia review: HTN, Heart murmur, OSA  Patient denies shortness of breath, fever, cough and chest pain at PAT appointment.  Patient verbalized understanding and agreement to the Pre-Surgical Instructions that were given to them at this PAT appointment. Patient was also educated of the need to review these PAT instructions again prior to his/her surgery.I reviewed the appropriate phone numbers to call if they have any and questions or concerns.

## 2023-04-24 ENCOUNTER — Encounter (HOSPITAL_COMMUNITY)
Admission: RE | Admit: 2023-04-24 | Discharge: 2023-04-24 | Disposition: A | Discharge status: Home / Self Care | Payer: Medicare Other | Source: Ambulatory Visit | Attending: Orthopedic Surgery | Admitting: Orthopedic Surgery

## 2023-04-24 ENCOUNTER — Encounter (HOSPITAL_COMMUNITY): Payer: Self-pay

## 2023-04-24 ENCOUNTER — Other Ambulatory Visit: Payer: Self-pay

## 2023-04-24 VITALS — BP 129/71 | HR 73 | Temp 98.4°F | Resp 16 | Ht 66.0 in | Wt 259.0 lb

## 2023-04-24 DIAGNOSIS — I10 Essential (primary) hypertension: Secondary | ICD-10-CM | POA: Diagnosis not present

## 2023-04-24 DIAGNOSIS — Z01818 Encounter for other preprocedural examination: Secondary | ICD-10-CM | POA: Insufficient documentation

## 2023-04-24 LAB — CBC
HCT: 46.5 % — ABNORMAL HIGH (ref 36.0–46.0)
Hemoglobin: 14.5 g/dL (ref 12.0–15.0)
MCH: 27.2 pg (ref 26.0–34.0)
MCHC: 31.2 g/dL (ref 30.0–36.0)
MCV: 87.2 fL (ref 80.0–100.0)
Platelets: 192 10*3/uL (ref 150–400)
RBC: 5.33 MIL/uL — ABNORMAL HIGH (ref 3.87–5.11)
RDW: 16.2 % — ABNORMAL HIGH (ref 11.5–15.5)
WBC: 10.3 10*3/uL (ref 4.0–10.5)
nRBC: 0 % (ref 0.0–0.2)

## 2023-04-24 LAB — SURGICAL PCR SCREEN
MRSA, PCR: NEGATIVE
Staphylococcus aureus: NEGATIVE

## 2023-04-24 LAB — BASIC METABOLIC PANEL
Anion gap: 9 (ref 5–15)
BUN: 20 mg/dL (ref 8–23)
CO2: 26 mmol/L (ref 22–32)
Calcium: 9.5 mg/dL (ref 8.9–10.3)
Chloride: 101 mmol/L (ref 98–111)
Creatinine, Ser: 0.83 mg/dL (ref 0.44–1.00)
GFR, Estimated: >60 mL/min (ref >60)
Glucose, Bld: 92 mg/dL (ref 70–99)
Potassium: 4.7 mmol/L (ref 3.5–5.1)
Sodium: 136 mmol/L (ref 135–145)

## 2023-04-27 ENCOUNTER — Encounter (HOSPITAL_COMMUNITY): Payer: Self-pay

## 2023-04-27 NOTE — Anesthesia Preprocedure Evaluation (Deleted)
Anesthesia Evaluation    Airway        Dental   Pulmonary former smoker          Cardiovascular hypertension,      Neuro/Psych    GI/Hepatic   Endo/Other    Renal/GU      Musculoskeletal   Abdominal   Peds  Hematology   Anesthesia Other Findings   Reproductive/Obstetrics                              Anesthesia Physical Anesthesia Plan  ASA:   Anesthesia Plan:    Post-op Pain Management:    Induction:   PONV Risk Score and Plan:   Airway Management Planned:   Additional Equipment:   Intra-op Plan:   Post-operative Plan:   Informed Consent:   Plan Discussed with:   Anesthesia Plan Comments: (See PAT note from 5/20 by Sherlie Ban PA-C )         Anesthesia Quick Evaluation

## 2023-04-27 NOTE — Progress Notes (Addendum)
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 Pt was previously scheduled for shoulder surgery but rescheduled because a murmur was auscultated and she was told she needed an echo done. Echo was done on 03/29/23 which showed mild mitral regurg. Prior anesthesia complication includes prolonged emergence.  Other PMH includes former smoking, recent diagnosis of cecal mass s/p right colectomy (11/03/2022) complicated by microperforation of her ileocolectomy anastomosis site which was treated conservatively, HTN, OSA on CPAP, hx of RCC s/p left nephrectomy (2014), thyroiditis s/p thyroidectomy, left shoulder OA, GERD.  All chronic medical issues appear to be stable/controlled. Pt has followed up with her PCP and general surgeon since prior hospitalization.  VS: BP 129/71   Pulse 73   Temp 36.9 C (Oral)   Resp 16   Ht 5\' 6"  (1.676 m)   Wt 117.5 kg   SpO2 99%   BMI 41.80 kg/m   PROVIDERS: Daisy Floro, MD   LABS: Labs reviewed: Acceptable for surgery. (all labs ordered are listed, but only abnormal results are displayed)  Labs Reviewed  CBC - Abnormal; Notable for the following components:      Result Value   RBC 5.33 (*)    HCT 46.5 (*)    RDW 16.2 (*)    All other components within normal limits  SURGICAL PCR SCREEN  BASIC METABOLIC PANEL     IMAGES: CXR 05/26/22:  FINDINGS: The heart size and mediastinal contours are within normal limits. Both lungs are clear. Disc degenerative disease of the thoracic spine.   IMPRESSION: No acute abnormality of the lungs.    EKG 07/11/22:  NSR Septal infarct, age undetermined No significant change from  prior  CV:  Echo 03/29/23:  Normal LV systolic function with visual EF 60-65%. Left ventricle cavity  is normal in size. Normal left ventricular wall thickness. Normal global  wall motion. Normal diastolic filling pattern, normal LAP. Calculated EF  67%.  Mild (Grade I) mitral regurgitation. Mild mitral valve leaflet  calcification.  Structurally normal tricuspid valve with trace regurgitation. No evidence  of pulmonary hypertension.  No prior available for comparison.   Past Medical History:  Diagnosis Date   Acquired solitary kidney    right side 2014   Arthritis    Cancer (HCC)    renal cell carcinoma- left kidney removed   Complication of anesthesia    slow to wake, hard to take a deep breath with 1 surgery several yrs ago   Frequency of urination    Headache    Heart murmur    per told by pcp approx. 02/ 2022 heard a very faint murmur, told did need work-up done at this time   History of COVID-19 2021   all symptoms resolved   History of Hashimoto thyroiditis    s/p  total thyroidectomy 1995   History of hypercalcemia    s/p  parathryoidectomy 07/ 2021   History of renal cell carcinoma 2014   s/p  left nephrectomy, pt stated no other treatment   Hypertension    followed by pcp   Hypothyroidism, postsurgical 1995   followed by pcp in [place], moved from [place] 01/ 2021  OSA on CPAP    Thickened endometrium    UTI (urinary tract infection)    finished keflex 08-12-2022   Wears glasses     Past Surgical History:  Procedure Laterality Date   BREAST BIOPSY Left 08/24/2021   BREAST LUMPECTOMY WITH RADIOACTIVE SEED LOCALIZATION Left 07/14/2022   Procedure: LEFT BREAST LUMPECTOMY WITH RADIOACTIVE SEED LOCALIZATION;  Surgeon: Emelia Loron, MD;  Location: [facility];  Service: General;  Laterality: Left;   CYSTOSCOPY  08/04/2022   in dr winter office   CYSTOSCOPY WITH BIOPSY N/A 09/02/2022   Procedure: CYSTOSCOPY WITH BLADDER BIOPSY;  Surgeon:  Rene Paci, MD;  Location: [facility];  Service: Urology;  Laterality: N/A;  ONLY NEEDS 30 MIN   HYSTEROSCOPY WITH D & C N/A 04/08/2021   Procedure: DILATATION AND CURETTAGE /HYSTEROSCOPY, EXCISION OF VAGINAL LESION;  Surgeon: Silverio Lay, MD;  Location:  SURGERY CENTER;  Service: Gynecology;  Laterality: N/A;   LAPAROSCOPIC CHOLECYSTECTOMY  2005   NEPHRECTOMY Left 2014   PARATHYROIDECTOMY  07/ 2021  in [place]   TONSILLECTOMY  age 20   TOTAL HIP ARTHROPLASTY Bilateral left 2015;  right 2009   TOTAL KNEE ARTHROPLASTY Left 2014   TOTAL THYROIDECTOMY Bilateral 1995   WISDOM TOOTH EXTRACTION Bilateral     MEDICATIONS:  acetaminophen (TYLENOL) 500 MG tablet   albuterol (VENTOLIN HFA) 108 (90 Base) MCG/ACT inhaler   Ascorbic Acid (VITAMIN C PO)   cephALEXin (KEFLEX) 250 MG capsule   Cholecalciferol (VITAMIN D3 SUPER STRENGTH) 50 MCG (2000 UT) CAPS   cholestyramine (QUESTRAN) 4 g packet   clotrimazole (LOTRIMIN) 1 % cream   CRANBERRY PO   D-MANNOSE PO   erythromycin ophthalmic ointment   HYDROcodone-acetaminophen (NORCO/VICODIN) 5-325 MG tablet   hydrocortisone 2.5 % cream   ibuprofen (ADVIL) 600 MG tablet   letrozole (FEMARA) 2.5 MG tablet   levothyroxine (SYNTHROID) 150 MCG tablet   losartan (COZAAR) 50 MG tablet   NON FORMULARY   oxybutynin (DITROPAN) 5 MG tablet   phenazopyridine (PYRIDIUM) 200 MG tablet   Probiotic Product (PROBIOTIC DAILY PO)   simvastatin (ZOCOR) 10 MG tablet   tamsulosin (FLOMAX) 0.4 MG CAPS capsule   vitamin B-12 (CYANOCOBALAMIN) 1000 MCG tablet   No current facility-administered medications for this encounter.   [name] [facility] Short Stay/Anesthesiology [facility] Phone [phone] 04/27/2023 3:46 PM

## 2023-05-05 ENCOUNTER — Ambulatory Visit (HOSPITAL_COMMUNITY): Admission: RE | Admit: 2023-05-05 | Payer: Medicare Other | Source: Home / Self Care | Admitting: Orthopedic Surgery

## 2023-05-05 ENCOUNTER — Encounter (HOSPITAL_COMMUNITY): Admission: RE | Payer: Self-pay | Source: Home / Self Care

## 2023-05-05 SURGERY — ARTHROPLASTY, SHOULDER, TOTAL, REVERSE
Anesthesia: Choice | Site: Shoulder | Laterality: Left

## 2023-05-05 NOTE — Progress Notes (Signed)
Patient called this morning with complaints of respiratory symptoms.   Horse, cough, nasal drainage and post nasal drip, no fever,  Symptoms started last night started with sore throat and has came on suddenly, became more congested overnight,    Patient did a home negative test.  Per Dr Mal Amabile with anesthesia patient is cancelled today based on her symptoms.    Will notify Dr Ranell Patrick.  Patient instructed to call Dr Ranell Patrick office to reschedule her surgery

## 2023-05-08 DIAGNOSIS — J40 Bronchitis, not specified as acute or chronic: Secondary | ICD-10-CM

## 2023-05-08 HISTORY — DX: Bronchitis, not specified as acute or chronic: J40

## 2023-05-11 ENCOUNTER — Encounter (HOSPITAL_COMMUNITY): Payer: Self-pay

## 2023-05-24 NOTE — H&P (Signed)
Patient's anticipated LOS is less than 2 midnights, meeting these requirements: - Younger than 99 - Lives within 1 hour of care - Has a competent adult at home to recover with post-op recover - NO history of  - Chronic pain requiring opiods  - Diabetes  - Coronary Artery Disease  - Heart failure  - Heart attack  - Stroke  - DVT/VTE  - Cardiac arrhythmia  - Respiratory Failure/COPD  - Renal failure  - Anemia  - Advanced Liver disease     Brooke Duncan is an 77 y.o. female.    Chief Complaint: left shoulder pain  HPI: Pt is a 77 y.o. female complaining of left shoulder pain for multiple years. Pain had continually increased since the beginning. X-rays in the clinic show end-stage arthritic changes of the left shoulder. Pt has tried various conservative treatments which have failed to alleviate their symptoms, including injections and therapy. Various options are discussed with the patient. Risks, benefits and expectations were discussed with the patient. Patient understand the risks, benefits and expectations and wishes to proceed with surgery.   PCP:  Daisy Floro, MD  D/C Plans: Home  PMH: Past Medical History:  Diagnosis Date   Acquired solitary kidney    right side 2014   Arthritis    Cancer (HCC)    renal cell carcinoma- left kidney removed   Complication of anesthesia    slow to wake, hard to take a deep breath with 1 surgery several yrs ago   Frequency of urination    Headache    Heart murmur    per told by pcp approx. 02/ 2022 heard a very faint murmur, told did need work-up done at this time   History of COVID-19 2021   all symptoms resolved   History of Hashimoto thyroiditis    s/p  total thyroidectomy 1995   History of hypercalcemia    s/p  parathryoidectomy 07/ 2021   History of renal cell carcinoma 2014   s/p  left nephrectomy, pt stated no other treatment   Hypertension    followed by pcp   Hypothyroidism, postsurgical 1995   followed by  pcp in [place], moved from [place] 01/ 2021   OSA on CPAP    Thickened endometrium    UTI (urinary tract infection)    finished keflex 08-12-2022   Wears glasses     PSH: Past Surgical History:  Procedure Laterality Date   BREAST BIOPSY Left 08/24/2021   BREAST LUMPECTOMY WITH RADIOACTIVE SEED LOCALIZATION Left 07/14/2022   Procedure: LEFT BREAST LUMPECTOMY WITH RADIOACTIVE SEED LOCALIZATION;  Surgeon: Emelia Loron, MD;  Location: [facility];  Service: General;  Laterality: Left;   CYSTOSCOPY  08/04/2022   in dr winter office   CYSTOSCOPY WITH BIOPSY N/A 09/02/2022   Procedure: CYSTOSCOPY WITH BLADDER BIOPSY;  Surgeon: Rene Paci, MD;  Location: [facility];  Service: Urology;  Laterality: N/A;  ONLY NEEDS 30 MIN   HYSTEROSCOPY WITH D & C N/A 04/08/2021   Procedure: DILATATION AND CURETTAGE /HYSTEROSCOPY, EXCISION OF VAGINAL LESION;  Surgeon: Silverio Lay, MD;  Location: [facility];  Service: Gynecology;  Laterality: N/A;   LAPAROSCOPIC CHOLECYSTECTOMY  2005   NEPHRECTOMY Left 2014   PARATHYROIDECTOMY  07/ 2021  in [place]   TONSILLECTOMY  age 38   TOTAL HIP ARTHROPLASTY Bilateral left 2015;  right 2009   TOTAL KNEE ARTHROPLASTY Left 2014   TOTAL THYROIDECTOMY Bilateral 1995   WISDOM TOOTH  EXTRACTION Bilateral     Social History:  reports that she quit smoking about 49 years ago. Her smoking use included cigarettes. She has a 18.00 pack-year smoking history. She has never used smokeless tobacco. She reports current alcohol use. She reports that she does not use drugs. BMI: Estimated body mass index is 41.8 kg/m as calculated from the following:   Height as of 04/24/23: 5\' 6"  (1.676 m).   Weight as of 04/24/23: 117.5 kg.  Lab Results  Component Value Date   ALBUMIN 3.7 09/01/2021   Diabetes: Patient does not have a diagnosis of diabetes.     Smoking Status:      Allergies:  Allergies  Allergen Reactions    Wound Dressing Adhesive Rash    Adhesive tape causes rash, can use papee tape   Tape Rash and Other (See Comments)    "Blistery rash" DERMABOND    Medications: No current facility-administered medications for this encounter.   Current Outpatient Medications  Medication Sig Dispense Refill   acetaminophen (TYLENOL) 500 MG tablet Take 2 tablets (1,000 mg total) by mouth every 6 (six) hours as needed. 30 tablet 0   albuterol (VENTOLIN HFA) 108 (90 Base) MCG/ACT inhaler Inhale 2 puffs into the lungs every 4 (four) hours as needed for shortness of breath or wheezing.     Ascorbic Acid (VITAMIN C PO) Take 1 tablet by mouth daily.     cephALEXin (KEFLEX) 250 MG capsule Take 250 mg by mouth daily.     Cholecalciferol (VITAMIN D3 SUPER STRENGTH) 50 MCG (2000 UT) CAPS Take 2,000 Units by mouth daily.     cholestyramine (QUESTRAN) 4 g packet Take one pack PO BID (Patient not taking: Reported on 02/08/2023) 60 each 11   clotrimazole (LOTRIMIN) 1 % cream Apply 1 application  topically 2 (two) times daily as needed (irritation).     CRANBERRY PO Take 2 capsules by mouth daily.     D-MANNOSE PO Take 4 capsules by mouth daily.     erythromycin ophthalmic ointment Place 1 application  into both eyes daily as needed (blepharitis).     HYDROcodone-acetaminophen (NORCO/VICODIN) 5-325 MG tablet Take 1-2 tablets by mouth every 6 (six) hours as needed for moderate pain or severe pain. 20 tablet 0   hydrocortisone 2.5 % cream Apply 1 Application topically 2 (two) times daily as needed (irritation).     ibuprofen (ADVIL) 600 MG tablet Take 1 tablet (600 mg total) by mouth every 6 (six) hours as needed. (Patient not taking: Reported on 02/08/2023) 30 tablet 0   letrozole (FEMARA) 2.5 MG tablet Take 1 tablet (2.5 mg total) by mouth daily. 90 tablet 3   levothyroxine (SYNTHROID) 150 MCG tablet Take 150 mcg by mouth daily before breakfast.     losartan (COZAAR) 50 MG tablet Take 50 mg by mouth daily.     NON FORMULARY  Pt uses a cpap nightly     oxybutynin (DITROPAN) 5 MG tablet Take 1 tablet (5 mg total) by mouth every 8 (eight) hours as needed for bladder spasms. (Patient not taking: Reported on 10/25/2022) 30 tablet 1   phenazopyridine (PYRIDIUM) 200 MG tablet Take 1 tablet (200 mg total) by mouth 3 (three) times daily as needed (for pain with urination). (Patient not taking: Reported on 10/25/2022) 30 tablet 0   Probiotic Product (PROBIOTIC DAILY PO) Take 1 capsule by mouth daily.     simvastatin (ZOCOR) 10 MG tablet Take 10 mg by mouth at bedtime.  tamsulosin (FLOMAX) 0.4 MG CAPS capsule Take 1 capsule (0.4 mg total) by mouth daily. (Patient taking differently: Take 0.4 mg by mouth every other day.) 30 capsule    vitamin B-12 (CYANOCOBALAMIN) 1000 MCG tablet Take 1,000 mcg by mouth daily.      No results found for this or any previous visit (from the past 48 hour(s)). No results found.  ROS: Pain with rom of the left upper extremity  Physical Exam: Alert and oriented 77 y.o. female in no acute distress Cranial nerves 2-12 intact Cervical spine: full rom with no tenderness, nv intact distally Chest: active breath sounds bilaterally, no wheeze rhonchi or rales Heart: regular rate and rhythm, no murmur Abd: non tender non distended with active bowel sounds Hip is stable with rom  Left shoulder painful rom with crepitus Nv intact distally No rashes or edema   Assessment/Plan Assessment: left shoulder cuff arthropathy  Plan:  Patient will undergo a left reverse total shoulder by Dr. Ranell Patrick at [facility] Risks benefits and expectations were discussed with the patient. Patient understand risks, benefits and expectations and wishes to proceed. Preoperative templating of the joint replacement has been completed, documented, and submitted to the Operating Room personnel in order to optimize intra-operative equipment management.   Alphonsa Overall PA-C, MPAS [facility] is now [employer] [address], [address], [place], [place] [address] Phone: [phone] [email or web address] Facebook  [employer]

## 2023-06-09 NOTE — Patient Instructions (Signed)
DUE TO COVID-19 ONLY TWO VISITORS  (aged 76 and older)  ARE ALLOWED TO COME WITH YOU AND STAY IN THE WAITING ROOM ONLY DURING PRE OP AND PROCEDURE.   **NO VISITORS ARE ALLOWED IN THE SHORT STAY AREA OR RECOVERY ROOM!!**  IF YOU WILL BE ADMITTED INTO THE HOSPITAL YOU ARE ALLOWED ONLY FOUR SUPPORT PEOPLE DURING VISITATION HOURS ONLY (7 AM -8PM)   The support person(s) must pass our screening, gel in and out, and wear a mask at all times, including in the patient's room. Patients must also wear a mask when staff or their support person are in the room. Visitors GUEST BADGE MUST BE WORN VISIBLY  One adult visitor may remain with you overnight and MUST be in the room by 8 P.M.     Your procedure is scheduled on: 06/16/23   Report to [facility] Main Entrance    Report to admitting at : 12:30 PM   Call this number if you have problems the morning of surgery [phone]   Do not eat food :After Midnight.   After Midnight you may have the following liquids until : 12:00 PM DAY OF SURGERY  Water Black Coffee (sugar ok, NO MILK/CREAM OR CREAMERS)  Tea (sugar ok, NO MILK/CREAM OR CREAMERS) regular and decaf                             Plain Jell-O (NO RED)                                           Fruit ices (not with fruit pulp, NO RED)                                     Popsicles (NO RED)                                                                  Juice: apple, WHITE grape, WHITE cranberry Sports drinks like Gatorade (NO RED)   The day of surgery:  Drink ONE (1) Pre-Surgery Clear Ensure at : 12:00 PM the morning of surgery. Drink in one sitting. Do not sip.  This drink was given to you during your hospital  pre-op appointment visit. Nothing else to drink after completing the  Pre-Surgery Clear Ensure or G2.          If you have questions, please contact your surgeon's office.   Oral Hygiene is also important to reduce your risk of infection.                                     Remember - BRUSH YOUR TEETH THE MORNING OF SURGERY WITH YOUR REGULAR TOOTHPASTE  DENTURES WILL BE REMOVED PRIOR TO SURGERY PLEASE DO NOT APPLY "Poly grip" OR ADHESIVES!!!   Do NOT smoke after Midnight   Take these medicines the morning of surgery with A SIP OF WATER: letrozole,cephalexin,synthroid,tamsulosin.Use inhalers as usual.Tylenol as needed.  Bring  CPAP mask and tubing day of surgery.                              You may not have any metal on your body including hair pins, jewelry, and body piercing             Do not wear make-up, lotions, powders, perfumes/cologne, or deodorant  Do not wear nail polish including gel and S&S, artificial/acrylic nails, or any other type of covering on natural nails including finger and toenails. If you have artificial nails, gel coating, etc. that needs to be removed by a nail salon please have this removed prior to surgery or surgery may need to be canceled/ delayed if the surgeon/ anesthesia feels like they are unable to be safely monitored.   Do not shave  48 hours prior to surgery.    Do not bring valuables to the hospital. [facility] IS NOT             RESPONSIBLE   FOR VALUABLES.   Contacts, glasses, or bridgework may not be worn into surgery.   Bring small overnight bag day of surgery.   DO NOT BRING YOUR HOME MEDICATIONS TO THE HOSPITAL. PHARMACY WILL DISPENSE MEDICATIONS LISTED ON YOUR MEDICATION LIST TO YOU DURING YOUR ADMISSION IN THE HOSPITAL!    Patients discharged on the day of surgery will not be allowed to drive home.  Someone NEEDS to stay with you for the first 24 hours after anesthesia.   Special Instructions: Bring a copy of your healthcare power of attorney and living will documents         the day of surgery if you haven't scanned them before.              Please read over the following fact sheets you were given: IF YOU HAVE QUESTIONS ABOUT YOUR PRE-OP INSTRUCTIONS PLEASE CALL [phone]      Pre-operative 5  CHG Bath Instructions   You can play a key role in reducing the risk of infection after surgery. Your skin needs to be as free of germs as possible. You can reduce the number of germs on your skin by washing with CHG (chlorhexidine gluconate) soap before surgery. CHG is an antiseptic soap that kills germs and continues to kill germs even after washing.   DO NOT use if you have an allergy to chlorhexidine/CHG or antibacterial soaps. If your skin becomes reddened or irritated, stop using the CHG and notify one of our RNs at : [phone].   Please shower with the CHG soap starting 4 days before surgery using the following schedule:     Please keep in mind the following:  DO NOT shave, including legs and underarms, starting the day of your first shower.   You may shave your face at any point before/day of surgery.  Place clean sheets on your bed the day you start using CHG soap. Use a clean washcloth (not used since being washed) for each shower. DO NOT sleep with pets once you start using the CHG.   CHG Shower Instructions:  If you choose to wash your hair and private area, wash first with your normal shampoo/soap.  After you use shampoo/soap, rinse your hair and body thoroughly to remove shampoo/soap residue.  Turn the water OFF and apply about 3 tablespoons (45 ml) of CHG soap to a CLEAN washcloth.  Apply CHG soap ONLY FROM YOUR NECK  DOWN TO YOUR TOES (washing for 3-5 minutes)  DO NOT use CHG soap on face, private areas, open wounds, or sores.  Pay special attention to the area where your surgery is being performed.  If you are having back surgery, having someone wash your back for you may be helpful. Wait 2 minutes after CHG soap is applied, then you may rinse off the CHG soap.  Pat dry with a clean towel  Put on clean clothes/pajamas   If you choose to wear lotion, please use ONLY the CHG-compatible lotions on the back of this paper.     Additional instructions for the day of  surgery: DO NOT APPLY any lotions, deodorants, cologne, or perfumes.   Put on clean/comfortable clothes.  Brush your teeth.  Ask your nurse before applying any prescription medications to the skin.      CHG Compatible Lotions   Aveeno Moisturizing lotion  Cetaphil Moisturizing Cream  Cetaphil Moisturizing Lotion  Clairol Herbal Essence Moisturizing Lotion, Dry Skin  Clairol Herbal Essence Moisturizing Lotion, Extra Dry Skin  Clairol Herbal Essence Moisturizing Lotion, Normal Skin  Curel Age Defying Therapeutic Moisturizing Lotion with Alpha Hydroxy  Curel Extreme Care Body Lotion  Curel Soothing Hands Moisturizing Hand Lotion  Curel Therapeutic Moisturizing Cream, Fragrance-Free  Curel Therapeutic Moisturizing Lotion, Fragrance-Free  Curel Therapeutic Moisturizing Lotion, Original Formula  Eucerin Daily Replenishing Lotion  Eucerin Dry Skin Therapy Plus Alpha Hydroxy Crme  Eucerin Dry Skin Therapy Plus Alpha Hydroxy Lotion  Eucerin Original Crme  Eucerin Original Lotion  Eucerin Plus Crme Eucerin Plus Lotion  Eucerin TriLipid Replenishing Lotion  Keri Anti-Bacterial Hand Lotion  Keri Deep Conditioning Original Lotion Dry Skin Formula Softly Scented  Keri Deep Conditioning Original Lotion, Fragrance Free Sensitive Skin Formula  Keri Lotion Fast Absorbing Fragrance Free Sensitive Skin Formula  Keri Lotion Fast Absorbing Softly Scented Dry Skin Formula  Keri Original Lotion  Keri Skin Renewal Lotion Keri Silky Smooth Lotion  Keri Silky Smooth Sensitive Skin Lotion  Nivea Body Creamy Conditioning Oil  Nivea Body Extra Enriched Lotion  Nivea Body Original Lotion  Nivea Body Sheer Moisturizing Lotion Nivea Crme  Nivea Skin Firming Lotion  NutraDerm 30 Skin Lotion  NutraDerm Skin Lotion  NutraDerm Therapeutic Skin Cream  NutraDerm Therapeutic Skin Lotion  ProShield Protective Hand Cream  Provon moisturizing lotion [place]- Preparing for Total Shoulder Arthroplasty     Before surgery, you can play an important role. Because skin is not sterile, your skin needs to be as free of germs as possible. You can reduce the number of germs on your skin by using the following products. Benzoyl Peroxide Gel Reduces the number of germs present on the skin Applied twice a day to shoulder area starting two days before surgery    ==================================================================  Please follow these instructions carefully:  BENZOYL PEROXIDE 5% GEL  Please do not use if you have an allergy to benzoyl peroxide.   If your skin becomes reddened/irritated stop using the benzoyl peroxide.  Starting two days before surgery, apply as follows: Apply benzoyl peroxide in the morning and at night. Apply after taking a shower. If you are not taking a shower clean entire shoulder front, back, and side along with the armpit with a clean wet washcloth.  Place a quarter-sized dollop on your shoulder and rub in thoroughly, making sure to cover the front, back, and side of your shoulder, along with the armpit.   2 days before ____ AM   ____ PM  1 day before ____ AM   ____ PM                         Do this twice a day for two days.  (Last application is the night before surgery, AFTER using the CHG soap as described below).  Do NOT apply benzoyl peroxide gel on the day of surgery.  Incentive Spirometer  An incentive spirometer is a tool that can help keep your lungs clear and active. This tool measures how well you are filling your lungs with each breath. Taking long deep breaths may help reverse or decrease the chance of developing breathing (pulmonary) problems (especially infection) following: A long period of time when you are unable to move or be active. BEFORE THE PROCEDURE  If the spirometer includes an indicator to show your best effort, your nurse or respiratory therapist will set it to a desired goal. If possible, sit up straight or lean  slightly forward. Try not to slouch. Hold the incentive spirometer in an upright position. INSTRUCTIONS FOR USE  Sit on the edge of your bed if possible, or sit up as far as you can in bed or on a chair. Hold the incentive spirometer in an upright position. Breathe out normally. Place the mouthpiece in your mouth and seal your lips tightly around it. Breathe in slowly and as deeply as possible, raising the piston or the ball toward the top of the column. Hold your breath for 3-5 seconds or for as long as possible. Allow the piston or ball to fall to the bottom of the column. Remove the mouthpiece from your mouth and breathe out normally. Rest for a few seconds and repeat Steps 1 through 7 at least 10 times every 1-2 hours when you are awake. Take your time and take a few normal breaths between deep breaths. The spirometer may include an indicator to show your best effort. Use the indicator as a goal to work toward during each repetition. After each set of 10 deep breaths, practice coughing to be sure your lungs are clear. If you have an incision (the cut made at the time of surgery), support your incision when coughing by placing a pillow or rolled up towels firmly against it. Once you are able to get out of bed, walk around indoors and cough well. You may stop using the incentive spirometer when instructed by your caregiver.  RISKS AND COMPLICATIONS Take your time so you do not get dizzy or light-headed. If you are in pain, you may need to take or ask for pain medication before doing incentive spirometry. It is harder to take a deep breath if you are having pain. AFTER USE Rest and breathe slowly and easily. It can be helpful to keep track of a log of your progress. Your caregiver can provide you with a simple table to help with this. If you are using the spirometer at home, follow these instructions: SEEK MEDICAL CARE IF:  You are having difficultly using the spirometer. You have trouble  using the spirometer as often as instructed. Your pain medication is not giving enough relief while using the spirometer. You develop fever of 100.5 F (38.1 C) or higher. SEEK IMMEDIATE MEDICAL CARE IF:  You cough up bloody sputum that had not been present before. You develop fever of 102 F (38.9 C) or greater. You develop worsening pain at or near the incision site. MAKE SURE YOU:  Understand these instructions. Will  watch your condition. Will get help right away if you are not doing well or get worse. Document Released: 04/03/2007 Document Revised: 02/13/2012 Document Reviewed: 06/04/2007 [facility] Patient Information 2014 [place], [place].   ________________________________________________________________________

## 2023-06-12 ENCOUNTER — Encounter (HOSPITAL_COMMUNITY)
Admission: RE | Admit: 2023-06-12 | Discharge: 2023-06-12 | Disposition: A | Discharge status: Home / Self Care | Payer: Medicare Other | Source: Ambulatory Visit | Attending: Orthopedic Surgery | Admitting: Orthopedic Surgery

## 2023-06-12 ENCOUNTER — Other Ambulatory Visit: Payer: Self-pay

## 2023-06-12 ENCOUNTER — Encounter (HOSPITAL_COMMUNITY): Payer: Self-pay

## 2023-06-12 VITALS — BP 147/74 | HR 80 | Temp 98.2°F | Ht 66.0 in | Wt 264.0 lb

## 2023-06-12 DIAGNOSIS — I2510 Atherosclerotic heart disease of native coronary artery without angina pectoris: Secondary | ICD-10-CM

## 2023-06-12 DIAGNOSIS — Z01812 Encounter for preprocedural laboratory examination: Secondary | ICD-10-CM | POA: Insufficient documentation

## 2023-06-12 DIAGNOSIS — Z01818 Encounter for other preprocedural examination: Secondary | ICD-10-CM

## 2023-06-12 LAB — CBC
HCT: 46.2 % — ABNORMAL HIGH (ref 36.0–46.0)
Hemoglobin: 14 g/dL (ref 12.0–15.0)
MCH: 26.7 pg (ref 26.0–34.0)
MCHC: 30.3 g/dL (ref 30.0–36.0)
MCV: 88 fL (ref 80.0–100.0)
Platelets: 169 10*3/uL (ref 150–400)
RBC: 5.25 MIL/uL — ABNORMAL HIGH (ref 3.87–5.11)
RDW: 16.2 % — ABNORMAL HIGH (ref 11.5–15.5)
WBC: 7 10*3/uL (ref 4.0–10.5)
nRBC: 0 % (ref 0.0–0.2)

## 2023-06-12 LAB — BASIC METABOLIC PANEL
Anion gap: 7 (ref 5–15)
BUN: 18 mg/dL (ref 8–23)
CO2: 27 mmol/L (ref 22–32)
Calcium: 9.6 mg/dL (ref 8.9–10.3)
Chloride: 106 mmol/L (ref 98–111)
Creatinine, Ser: 0.73 mg/dL (ref 0.44–1.00)
GFR, Estimated: >60 mL/min (ref >60)
Glucose, Bld: 103 mg/dL — ABNORMAL HIGH (ref 70–99)
Potassium: 4.5 mmol/L (ref 3.5–5.1)
Sodium: 140 mmol/L (ref 135–145)

## 2023-06-12 LAB — SURGICAL PCR SCREEN
MRSA, PCR: NEGATIVE
Staphylococcus aureus: NEGATIVE

## 2023-06-12 NOTE — Progress Notes (Signed)
For Short Stay: COVID SWAB appointment date:  Bowel Prep reminder:   For Anesthesia: PCP - Dr. Vianne Bulls Cardiologist - N/A  Chest x-ray - 05/26/22 EKG - 07/11/22 Stress Test -  ECHO - 03/29/23 Cardiac Cath -  Pacemaker/ICD device last checked: Pacemaker orders received: Device Rep notified:  Spinal Cord Stimulator: N/A  Sleep Study - Yes CPAP - Yes  Fasting Blood Sugar - N/A Checks Blood Sugar _____ times a day Date and result of last Hgb A1c-  Last dose of GLP1 agonist- N/A GLP1 instructions:   Last dose of SGLT-2 inhibitors- N/A SGLT-2 instructions:   Blood Thinner Instructions: N/A Aspirin Instructions: Last Dose:  Activity level: Can go up a flight of stairs and activities of daily living without stopping and without chest pain and/or shortness of breath   Able to exercise without chest pain and/or shortness of breath  Anesthesia review: Hx: HTN,Heart murmur,OSA(CPAP).  Patient denies shortness of breath, fever, cough and chest pain at PAT appointment   Patient verbalized understanding of instructions that were given to them at the PAT appointment. Patient was also instructed that they will need to review over the PAT instructions again at home before surgery.

## 2023-06-16 ENCOUNTER — Ambulatory Visit (HOSPITAL_COMMUNITY): Payer: Medicare Other

## 2023-06-16 ENCOUNTER — Other Ambulatory Visit: Payer: Self-pay

## 2023-06-16 ENCOUNTER — Encounter (HOSPITAL_COMMUNITY)
Admission: RE | Disposition: A | Discharge status: Home / Self Care | Payer: Self-pay | Source: Ambulatory Visit | Attending: Orthopedic Surgery

## 2023-06-16 ENCOUNTER — Encounter (HOSPITAL_COMMUNITY): Payer: Self-pay | Admitting: Orthopedic Surgery

## 2023-06-16 ENCOUNTER — Ambulatory Visit (HOSPITAL_BASED_OUTPATIENT_CLINIC_OR_DEPARTMENT_OTHER): Payer: Medicare Other | Admitting: Anesthesiology

## 2023-06-16 ENCOUNTER — Ambulatory Visit (HOSPITAL_COMMUNITY): Payer: Medicare Other | Admitting: Physician Assistant

## 2023-06-16 ENCOUNTER — Observation Stay (HOSPITAL_COMMUNITY)
Admission: RE | Admit: 2023-06-16 | Discharge: 2023-06-17 | Disposition: A | Discharge status: Home / Self Care | Payer: Medicare Other | Source: Ambulatory Visit | Attending: Orthopedic Surgery | Admitting: Orthopedic Surgery

## 2023-06-16 DIAGNOSIS — M19012 Primary osteoarthritis, left shoulder: Principal | ICD-10-CM | POA: Insufficient documentation

## 2023-06-16 DIAGNOSIS — E039 Hypothyroidism, unspecified: Secondary | ICD-10-CM

## 2023-06-16 DIAGNOSIS — Z79899 Other long term (current) drug therapy: Secondary | ICD-10-CM | POA: Insufficient documentation

## 2023-06-16 DIAGNOSIS — Z87891 Personal history of nicotine dependence: Secondary | ICD-10-CM | POA: Insufficient documentation

## 2023-06-16 DIAGNOSIS — Z8616 Personal history of COVID-19: Secondary | ICD-10-CM | POA: Insufficient documentation

## 2023-06-16 DIAGNOSIS — Z96612 Presence of left artificial shoulder joint: Principal | ICD-10-CM

## 2023-06-16 DIAGNOSIS — Z96643 Presence of artificial hip joint, bilateral: Secondary | ICD-10-CM | POA: Insufficient documentation

## 2023-06-16 DIAGNOSIS — Z85528 Personal history of other malignant neoplasm of kidney: Secondary | ICD-10-CM | POA: Insufficient documentation

## 2023-06-16 DIAGNOSIS — I10 Essential (primary) hypertension: Secondary | ICD-10-CM | POA: Insufficient documentation

## 2023-06-16 DIAGNOSIS — Z96652 Presence of left artificial knee joint: Secondary | ICD-10-CM | POA: Insufficient documentation

## 2023-06-16 HISTORY — PX: REVERSE SHOULDER ARTHROPLASTY: SHX5054

## 2023-06-16 SURGERY — ARTHROPLASTY, SHOULDER, TOTAL, REVERSE
Anesthesia: General | Site: Shoulder | Laterality: Left

## 2023-06-16 MED ORDER — SODIUM CHLORIDE 0.9 % IV SOLN: INTRAVENOUS | Status: DC

## 2023-06-16 MED ORDER — VITAMIN C 500 MG PO TABS
1000.0000 mg | ORAL_TABLET | Freq: Every day | ORAL | Status: DC
  Administered 2023-06-17: 1000 mg via ORAL
  Filled 2023-06-16: qty 2

## 2023-06-16 MED ORDER — DOCUSATE SODIUM 100 MG PO CAPS
100.0000 mg | ORAL_CAPSULE | Freq: Two times a day (BID) | ORAL | Status: DC
  Administered 2023-06-16 – 2023-06-17 (×2): 100 mg via ORAL
  Filled 2023-06-16 (×2): qty 1

## 2023-06-16 MED ORDER — CEFAZOLIN SODIUM-DEXTROSE 2-4 GM/100ML-% IV SOLN
2.0000 g | Freq: Four times a day (QID) | INTRAVENOUS | Status: AC
  Administered 2023-06-17 (×3): 2 g via INTRAVENOUS
  Filled 2023-06-16 (×3): qty 100

## 2023-06-16 MED ORDER — ROCURONIUM BROMIDE 100 MG/10ML IV SOLN
INTRAVENOUS | Status: DC | PRN
  Administered 2023-06-16: 60 mg via INTRAVENOUS

## 2023-06-16 MED ORDER — ONDANSETRON HCL 4 MG PO TABS: 4.0000 mg | ORAL_TABLET | Freq: Three times a day (TID) | ORAL | 1 refills | Status: AC | PRN

## 2023-06-16 MED ORDER — SIMVASTATIN 20 MG PO TABS
10.0000 mg | ORAL_TABLET | Freq: Every day | ORAL | Status: DC
  Administered 2023-06-16: 10 mg via ORAL
  Filled 2023-06-16: qty 1

## 2023-06-16 MED ORDER — LEVOTHYROXINE SODIUM 75 MCG PO TABS
150.0000 ug | ORAL_TABLET | Freq: Every day | ORAL | Status: DC
  Administered 2023-06-17: 150 ug via ORAL
  Filled 2023-06-16: qty 2

## 2023-06-16 MED ORDER — PHENAZOPYRIDINE HCL 200 MG PO TABS: 200.0000 mg | ORAL_TABLET | Freq: Three times a day (TID) | ORAL | Status: DC | PRN

## 2023-06-16 MED ORDER — ACETAMINOPHEN 500 MG PO TABS: 1000.0000 mg | ORAL_TABLET | Freq: Four times a day (QID) | ORAL | Status: DC | PRN

## 2023-06-16 MED ORDER — ONDANSETRON HCL 4 MG/2ML IJ SOLN
INTRAMUSCULAR | Status: DC | PRN
  Administered 2023-06-16: 4 mg via INTRAVENOUS

## 2023-06-16 MED ORDER — ACETAMINOPHEN 160 MG/5ML PO SOLN: 325.0000 mg | ORAL | Status: DC | PRN

## 2023-06-16 MED ORDER — CEPHALEXIN 250 MG PO CAPS
250.0000 mg | ORAL_CAPSULE | Freq: Every day | ORAL | Status: DC
  Administered 2023-06-17: 250 mg via ORAL
  Filled 2023-06-16: qty 1

## 2023-06-16 MED ORDER — BUPIVACAINE-EPINEPHRINE (PF) 0.25% -1:200000 IJ SOLN
INTRAMUSCULAR | Status: DC | PRN
  Administered 2023-06-16: 30 mL

## 2023-06-16 MED ORDER — PROPOFOL 10 MG/ML IV BOLUS
INTRAVENOUS | Status: DC | PRN
Start: 2023-06-16 — End: 2023-06-16
  Administered 2023-06-16: 170 mg via INTRAVENOUS

## 2023-06-16 MED ORDER — METHOCARBAMOL 500 MG PO TABS: 500.0000 mg | ORAL_TABLET | Freq: Three times a day (TID) | ORAL | 1 refills | Status: DC | PRN

## 2023-06-16 MED ORDER — OXYBUTYNIN CHLORIDE 5 MG PO TABS: 5.0000 mg | ORAL_TABLET | Freq: Three times a day (TID) | ORAL | Status: DC | PRN

## 2023-06-16 MED ORDER — ONDANSETRON HCL 4 MG/2ML IJ SOLN
INTRAMUSCULAR | Status: AC
  Filled 2023-06-16: qty 2

## 2023-06-16 MED ORDER — FENTANYL CITRATE (PF) 100 MCG/2ML IJ SOLN
INTRAMUSCULAR | Status: DC | PRN
  Administered 2023-06-16: 70 ug via INTRAVENOUS
  Administered 2023-06-16: 50 ug via INTRAVENOUS

## 2023-06-16 MED ORDER — BUPIVACAINE HCL (PF) 0.5 % IJ SOLN
INTRAMUSCULAR | Status: DC | PRN
  Administered 2023-06-16: 12 mL via PERINEURAL

## 2023-06-16 MED ORDER — LACTATED RINGERS IV SOLN: INTRAVENOUS | Status: DC

## 2023-06-16 MED ORDER — PHENYLEPHRINE HCL (PRESSORS) 10 MG/ML IV SOLN
INTRAVENOUS | Status: DC | PRN
  Administered 2023-06-16: 100 ug via INTRAVENOUS
  Administered 2023-06-16 (×2): 160 ug via INTRAVENOUS

## 2023-06-16 MED ORDER — ONDANSETRON HCL 4 MG PO TABS: 4.0000 mg | ORAL_TABLET | Freq: Four times a day (QID) | ORAL | Status: DC | PRN

## 2023-06-16 MED ORDER — ACETAMINOPHEN 325 MG PO TABS: 325.0000 mg | ORAL_TABLET | Freq: Four times a day (QID) | ORAL | Status: DC | PRN

## 2023-06-16 MED ORDER — BISACODYL 10 MG RE SUPP: 10.0000 mg | Freq: Every day | RECTAL | Status: DC | PRN

## 2023-06-16 MED ORDER — ALBUTEROL SULFATE (2.5 MG/3ML) 0.083% IN NEBU: 2.5000 mg | INHALATION_SOLUTION | RESPIRATORY_TRACT | Status: DC | PRN

## 2023-06-16 MED ORDER — METHOCARBAMOL 500 MG IVPB - SIMPLE MED: 500.0000 mg | Freq: Four times a day (QID) | INTRAVENOUS | Status: DC | PRN

## 2023-06-16 MED ORDER — POLYETHYLENE GLYCOL 3350 17 G PO PACK: 17.0000 g | PACK | Freq: Every day | ORAL | Status: DC | PRN

## 2023-06-16 MED ORDER — HYDROCODONE-ACETAMINOPHEN 5-325 MG PO TABS: 1.0000 | ORAL_TABLET | Freq: Four times a day (QID) | ORAL | 0 refills | Status: DC | PRN

## 2023-06-16 MED ORDER — ACETAMINOPHEN 10 MG/ML IV SOLN: 1000.0000 mg | Freq: Once | INTRAVENOUS | Status: DC | PRN

## 2023-06-16 MED ORDER — MENTHOL 3 MG MT LOZG: 1.0000 | LOZENGE | OROMUCOSAL | Status: DC | PRN

## 2023-06-16 MED ORDER — LOSARTAN POTASSIUM 50 MG PO TABS
50.0000 mg | ORAL_TABLET | Freq: Every day | ORAL | Status: DC
  Administered 2023-06-16 – 2023-06-17 (×2): 50 mg via ORAL
  Filled 2023-06-16 (×2): qty 1

## 2023-06-16 MED ORDER — METHOCARBAMOL 500 MG PO TABS
500.0000 mg | ORAL_TABLET | Freq: Four times a day (QID) | ORAL | Status: DC | PRN
  Administered 2023-06-17: 500 mg via ORAL
  Filled 2023-06-16: qty 1

## 2023-06-16 MED ORDER — ALBUTEROL SULFATE HFA 108 (90 BASE) MCG/ACT IN AERS: 2.0000 | INHALATION_SPRAY | RESPIRATORY_TRACT | Status: DC | PRN

## 2023-06-16 MED ORDER — BUPIVACAINE HCL 0.25 % IJ SOLN
INTRAMUSCULAR | Status: AC
  Filled 2023-06-16: qty 1

## 2023-06-16 MED ORDER — LIDOCAINE HCL (PF) 2 % IJ SOLN
INTRAMUSCULAR | Status: AC
  Filled 2023-06-16: qty 5

## 2023-06-16 MED ORDER — OXYCODONE HCL 5 MG/5ML PO SOLN: 5.0000 mg | Freq: Once | ORAL | Status: DC | PRN

## 2023-06-16 MED ORDER — DEXAMETHASONE SODIUM PHOSPHATE 10 MG/ML IJ SOLN
INTRAMUSCULAR | Status: DC | PRN
  Administered 2023-06-16: 4 mg via INTRAVENOUS

## 2023-06-16 MED ORDER — CLOTRIMAZOLE 1 % EX CREA
1.0000 | TOPICAL_CREAM | Freq: Two times a day (BID) | CUTANEOUS | Status: DC | PRN
  Filled 2023-06-16: qty 15

## 2023-06-16 MED ORDER — EPINEPHRINE PF 1 MG/ML IJ SOLN
INTRAMUSCULAR | Status: AC
  Filled 2023-06-16: qty 1

## 2023-06-16 MED ORDER — IBUPROFEN 400 MG PO TABS: 600.0000 mg | ORAL_TABLET | Freq: Four times a day (QID) | ORAL | Status: DC | PRN

## 2023-06-16 MED ORDER — ACETAMINOPHEN 325 MG PO TABS: 325.0000 mg | ORAL_TABLET | ORAL | Status: DC | PRN

## 2023-06-16 MED ORDER — PROMETHAZINE HCL 25 MG/ML IJ SOLN: 6.2500 mg | INTRAMUSCULAR | Status: DC | PRN

## 2023-06-16 MED ORDER — METHOCARBAMOL 500 MG IVPB - SIMPLE MED
INTRAVENOUS | Status: AC
  Administered 2023-06-16: 500 mg via INTRAVENOUS
  Filled 2023-06-16: qty 55

## 2023-06-16 MED ORDER — PHENYLEPHRINE HCL (PRESSORS) 10 MG/ML IV SOLN
INTRAVENOUS | Status: AC
  Filled 2023-06-16: qty 1

## 2023-06-16 MED ORDER — HYDROCORTISONE 2.5 % EX CREA: 1.0000 | TOPICAL_CREAM | Freq: Two times a day (BID) | CUTANEOUS | Status: DC | PRN

## 2023-06-16 MED ORDER — VITAMIN B-12 1000 MCG PO TABS
1000.0000 ug | ORAL_TABLET | Freq: Every day | ORAL | Status: DC
  Administered 2023-06-17: 1000 ug via ORAL
  Filled 2023-06-16: qty 1

## 2023-06-16 MED ORDER — STERILE WATER FOR IRRIGATION IR SOLN
Status: DC | PRN
  Administered 2023-06-16: 2000 mL

## 2023-06-16 MED ORDER — ORAL CARE MOUTH RINSE: 15.0000 mL | Freq: Once | OROMUCOSAL | Status: AC

## 2023-06-16 MED ORDER — MIDAZOLAM HCL 2 MG/2ML IJ SOLN
1.0000 mg | INTRAMUSCULAR | Status: AC
  Administered 2023-06-16: 2 mg via INTRAVENOUS
  Filled 2023-06-16: qty 2

## 2023-06-16 MED ORDER — MIDAZOLAM HCL 5 MG/5ML IJ SOLN
INTRAMUSCULAR | Status: DC | PRN
  Administered 2023-06-16: 1 mg via INTRAVENOUS

## 2023-06-16 MED ORDER — PHENYLEPHRINE HCL-NACL 20-0.9 MG/250ML-% IV SOLN
INTRAVENOUS | Status: DC | PRN
  Administered 2023-06-16: 30 ug/min via INTRAVENOUS

## 2023-06-16 MED ORDER — HYDROCODONE-ACETAMINOPHEN 5-325 MG PO TABS
1.0000 | ORAL_TABLET | ORAL | Status: DC | PRN
  Administered 2023-06-17: 1 via ORAL
  Filled 2023-06-16: qty 1

## 2023-06-16 MED ORDER — PROPOFOL 10 MG/ML IV BOLUS
INTRAVENOUS | Status: AC
  Filled 2023-06-16: qty 20

## 2023-06-16 MED ORDER — MIDAZOLAM HCL 2 MG/2ML IJ SOLN
INTRAMUSCULAR | Status: AC
  Filled 2023-06-16: qty 2

## 2023-06-16 MED ORDER — CHLORHEXIDINE GLUCONATE 0.12 % MT SOLN
15.0000 mL | Freq: Once | OROMUCOSAL | Status: AC
  Administered 2023-06-16: 15 mL via OROMUCOSAL

## 2023-06-16 MED ORDER — TRANEXAMIC ACID-NACL 1000-0.7 MG/100ML-% IV SOLN
1000.0000 mg | INTRAVENOUS | Status: AC
  Administered 2023-06-16: 1000 mg via INTRAVENOUS
  Filled 2023-06-16: qty 100

## 2023-06-16 MED ORDER — FENTANYL CITRATE PF 50 MCG/ML IJ SOSY
50.0000 ug | PREFILLED_SYRINGE | INTRAMUSCULAR | Status: AC
  Administered 2023-06-16: 50 ug via INTRAVENOUS
  Filled 2023-06-16: qty 2

## 2023-06-16 MED ORDER — FENTANYL CITRATE (PF) 100 MCG/2ML IJ SOLN
INTRAMUSCULAR | Status: AC
  Filled 2023-06-16: qty 2

## 2023-06-16 MED ORDER — VITAMIN D 25 MCG (1000 UNIT) PO TABS
2000.0000 [IU] | ORAL_TABLET | Freq: Every day | ORAL | Status: DC
  Administered 2023-06-17: 2000 [IU] via ORAL
  Filled 2023-06-16: qty 2

## 2023-06-16 MED ORDER — 0.9 % SODIUM CHLORIDE (POUR BTL) OPTIME
TOPICAL | Status: DC | PRN
  Administered 2023-06-16: 1000 mL

## 2023-06-16 MED ORDER — HYDROCORTISONE (PERIANAL) 2.5 % EX CREA: TOPICAL_CREAM | Freq: Two times a day (BID) | CUTANEOUS | Status: DC | PRN

## 2023-06-16 MED ORDER — LIDOCAINE HCL (CARDIAC) PF 100 MG/5ML IV SOSY
PREFILLED_SYRINGE | INTRAVENOUS | Status: DC | PRN
  Administered 2023-06-16: 50 mg via INTRAVENOUS

## 2023-06-16 MED ORDER — ERYTHROMYCIN 5 MG/GM OP OINT: 1.0000 | TOPICAL_OINTMENT | Freq: Every day | OPHTHALMIC | Status: DC | PRN

## 2023-06-16 MED ORDER — ERYTHROMYCIN 5 MG/GM OP OINT: TOPICAL_OINTMENT | Freq: Every day | OPHTHALMIC | Status: DC | PRN

## 2023-06-16 MED ORDER — METOCLOPRAMIDE HCL 5 MG/ML IJ SOLN: 5.0000 mg | Freq: Three times a day (TID) | INTRAMUSCULAR | Status: DC | PRN

## 2023-06-16 MED ORDER — DEXAMETHASONE SODIUM PHOSPHATE 10 MG/ML IJ SOLN
INTRAMUSCULAR | Status: AC
  Filled 2023-06-16: qty 1

## 2023-06-16 MED ORDER — BUPIVACAINE LIPOSOME 1.3 % IJ SUSP
INTRAMUSCULAR | Status: DC | PRN
  Administered 2023-06-16: 10 mL via PERINEURAL

## 2023-06-16 MED ORDER — CHOLECALCIFEROL 50 MCG (2000 UT) PO CAPS: 2000.0000 [IU] | ORAL_CAPSULE | Freq: Every day | ORAL | Status: DC

## 2023-06-16 MED ORDER — CEFAZOLIN SODIUM-DEXTROSE 2-4 GM/100ML-% IV SOLN
2.0000 g | INTRAVENOUS | Status: AC
  Administered 2023-06-16: 2 g via INTRAVENOUS
  Filled 2023-06-16: qty 100

## 2023-06-16 MED ORDER — METOCLOPRAMIDE HCL 5 MG PO TABS: 5.0000 mg | ORAL_TABLET | Freq: Three times a day (TID) | ORAL | Status: DC | PRN

## 2023-06-16 MED ORDER — LETROZOLE 2.5 MG PO TABS
2.5000 mg | ORAL_TABLET | Freq: Every day | ORAL | Status: DC
  Administered 2023-06-17: 2.5 mg via ORAL
  Filled 2023-06-16: qty 1

## 2023-06-16 MED ORDER — ONDANSETRON HCL 4 MG/2ML IJ SOLN: 4.0000 mg | Freq: Four times a day (QID) | INTRAMUSCULAR | Status: DC | PRN

## 2023-06-16 MED ORDER — FENTANYL CITRATE PF 50 MCG/ML IJ SOSY: 25.0000 ug | PREFILLED_SYRINGE | INTRAMUSCULAR | Status: DC | PRN

## 2023-06-16 MED ORDER — TRANEXAMIC ACID-NACL 1000-0.7 MG/100ML-% IV SOLN
1000.0000 mg | Freq: Once | INTRAVENOUS | Status: AC
  Administered 2023-06-17: 1000 mg via INTRAVENOUS
  Filled 2023-06-16: qty 100

## 2023-06-16 MED ORDER — ROCURONIUM BROMIDE 10 MG/ML (PF) SYRINGE
PREFILLED_SYRINGE | INTRAVENOUS | Status: AC
  Filled 2023-06-16: qty 10

## 2023-06-16 MED ORDER — OXYCODONE HCL 5 MG PO TABS: 5.0000 mg | ORAL_TABLET | Freq: Once | ORAL | Status: DC | PRN

## 2023-06-16 MED ORDER — SUGAMMADEX SODIUM 200 MG/2ML IV SOLN
INTRAVENOUS | Status: DC | PRN
  Administered 2023-06-16: 200 mg via INTRAVENOUS

## 2023-06-16 MED ORDER — AMISULPRIDE (ANTIEMETIC) 5 MG/2ML IV SOLN: 10.0000 mg | Freq: Once | INTRAVENOUS | Status: DC | PRN

## 2023-06-16 MED ORDER — TAMSULOSIN HCL 0.4 MG PO CAPS
0.4000 mg | ORAL_CAPSULE | Freq: Every day | ORAL | Status: DC
  Administered 2023-06-17: 0.4 mg via ORAL
  Filled 2023-06-16: qty 1

## 2023-06-16 MED ORDER — MORPHINE SULFATE (PF) 2 MG/ML IV SOLN: 0.5000 mg | INTRAVENOUS | Status: DC | PRN

## 2023-06-16 MED ORDER — PHENOL 1.4 % MT LIQD: 1.0000 | OROMUCOSAL | Status: DC | PRN

## 2023-06-16 MED ORDER — CHOLESTYRAMINE 4 G PO PACK
4.0000 g | PACK | Freq: Two times a day (BID) | ORAL | Status: DC
  Filled 2023-06-16: qty 1

## 2023-06-16 SURGICAL SUPPLY — 68 items
AID PSTN UNV HD RSTRNT DISP (MISCELLANEOUS) ×1
BAG COUNTER SPONGE SURGICOUNT (BAG) IMPLANT
BAG SPEC THK2 15X12 ZIP CLS (MISCELLANEOUS)
BAG SPNG CNTER NS LX DISP (BAG)
BAG ZIPLOCK 12X15 (MISCELLANEOUS) IMPLANT
BIT DRILL 1.6MX128 (BIT) IMPLANT
BIT DRILL 170X2.5X (BIT) IMPLANT
BIT DRL 170X2.5X (BIT) ×1
BLADE SAG 18X100X1.27 (BLADE) ×1 IMPLANT
COVER BACK TABLE 60X90IN (DRAPES) ×1 IMPLANT
COVER SURGICAL LIGHT HANDLE (MISCELLANEOUS) ×1 IMPLANT
CUP D38 DXTEND STAND PLUS 6 HU (Orthopedic Implant) ×1 IMPLANT
CUP STD D38 DXTEND PLUS 6 HU (Orthopedic Implant) IMPLANT
DRAPE INCISE IOBAN 66X45 STRL (DRAPES) ×1 IMPLANT
DRAPE ORTHO SPLIT 77X108 STRL (DRAPES) ×2
DRAPE SHEET LG 3/4 BI-LAMINATE (DRAPES) ×1 IMPLANT
DRAPE SURG ORHT 6 SPLT 77X108 (DRAPES) ×2 IMPLANT
DRAPE TOP 10253 STERILE (DRAPES) ×1 IMPLANT
DRAPE U-SHAPE 47X51 STRL (DRAPES) ×1 IMPLANT
DRILL 2.5 (BIT) ×1
DRSG ADAPTIC 3X8 NADH LF (GAUZE/BANDAGES/DRESSINGS) ×1 IMPLANT
DRSG EMULSION OIL 3X16 NADH (GAUZE/BANDAGES/DRESSINGS) IMPLANT
DURAPREP 26ML APPLICATOR (WOUND CARE) ×1 IMPLANT
ELECT BLADE TIP CTD 4 INCH (ELECTRODE) ×1 IMPLANT
ELECT NDL TIP 2.8 STRL (NEEDLE) ×1 IMPLANT
ELECT NEEDLE TIP 2.8 STRL (NEEDLE) ×1 IMPLANT
ELECT REM PT RETURN 15FT ADLT (MISCELLANEOUS) ×1 IMPLANT
EPI LT SZ 1 (Orthopedic Implant) ×1 IMPLANT
EPIPHYSIS LT SZ 1 (Orthopedic Implant) IMPLANT
FACESHIELD WRAPAROUND (MASK) ×1 IMPLANT
FACESHIELD WRAPAROUND OR TEAM (MASK) ×1 IMPLANT
GAUZE PAD ABD 8X10 STRL (GAUZE/BANDAGES/DRESSINGS) ×1 IMPLANT
GAUZE SPONGE 4X4 12PLY STRL (GAUZE/BANDAGES/DRESSINGS) ×1 IMPLANT
GLENOSPHERE DELTA XTEND LAT 38 (Miscellaneous) IMPLANT
GLOVE BIOGEL PI IND STRL 7.5 (GLOVE) ×1 IMPLANT
GLOVE BIOGEL PI IND STRL 8.5 (GLOVE) ×1 IMPLANT
GLOVE ORTHO TXT STRL SZ7.5 (GLOVE) ×1 IMPLANT
GLOVE SURG ORTHO 8.5 STRL (GLOVE) ×1 IMPLANT
GOWN STRL REUS W/ TWL XL LVL3 (GOWN DISPOSABLE) ×2 IMPLANT
GOWN STRL REUS W/TWL XL LVL3 (GOWN DISPOSABLE) ×2
KIT BASIN OR (CUSTOM PROCEDURE TRAY) ×1 IMPLANT
KIT TURNOVER KIT A (KITS) IMPLANT
MANIFOLD NEPTUNE II (INSTRUMENTS) ×1 IMPLANT
METAGLENE DELTA EXTEND (Trauma) IMPLANT
METAGLENE DXTEND (Trauma) ×1 IMPLANT
NDL MAYO CATGUT SZ4 TPR NDL (NEEDLE) IMPLANT
NEEDLE MAYO CATGUT SZ4 (NEEDLE) IMPLANT
NS IRRIG 1000ML POUR BTL (IV SOLUTION) ×1 IMPLANT
PACK SHOULDER (CUSTOM PROCEDURE TRAY) ×1 IMPLANT
PIN GUIDE 1.2 (PIN) IMPLANT
PIN GUIDE GLENOPHERE 1.5MX300M (PIN) IMPLANT
PIN METAGLENE 2.5 (PIN) IMPLANT
RESTRAINT HEAD UNIVERSAL NS (MISCELLANEOUS) ×1 IMPLANT
SCREW LOCK 42 (Screw) IMPLANT
SCREW LOCK DELTA XTEND 4.5X30 (Screw) IMPLANT
SLING ARM FOAM STRAP LRG (SOFTGOODS) IMPLANT
SPIKE FLUID TRANSFER (MISCELLANEOUS) ×1 IMPLANT
SPONGE T-LAP 4X18 ~~LOC~~+RFID (SPONGE) IMPLANT
STEM DELTA DIA 10 HA (Stem) IMPLANT
STRIP CLOSURE SKIN 1/2X4 (GAUZE/BANDAGES/DRESSINGS) ×1 IMPLANT
SUT FIBERWIRE #2 38 T-5 BLUE (SUTURE) ×1
SUT MNCRL AB 4-0 PS2 18 (SUTURE) ×1 IMPLANT
SUT VIC AB 0 CT1 36 (SUTURE) ×1 IMPLANT
SUT VIC AB 0 CT2 27 (SUTURE) ×1 IMPLANT
SUT VIC AB 2-0 CT1 27 (SUTURE) ×1
SUT VIC AB 2-0 CT1 TAPERPNT 27 (SUTURE) ×1 IMPLANT
SUTURE FIBERWR #2 38 T-5 BLUE (SUTURE) ×1 IMPLANT
TOWEL OR 17X26 10 PK STRL BLUE (TOWEL DISPOSABLE) ×1 IMPLANT

## 2023-06-16 NOTE — Discharge Instructions (Signed)
Ice to the shoulder constantly.  Keep the incision covered and clean and dry for one week, then ok to get it wet in the shower.  Do exercise as instructed several times per day.  DO NOT reach behind your back or push up out of a chair with the operative arm.  Use a sling while you are up and around for comfort, may remove while seated.  Keep pillow propped behind the operative elbow. You need to keep the arm forward enough so you can always see your left elbow.   Follow up with Dr Ranell Patrick in two weeks in the office, call [phone] for appt  Please call Dr Ranell Patrick (cell) at [phone] with any questions or concerns.

## 2023-06-16 NOTE — Op Note (Signed)
NAME: Brooke Duncan, Brooke Duncan MEDICAL RECORD NO: [id number] ACCOUNT NO: [id number] DATE OF BIRTH: May 09, 1946 FACILITY: [facility] LOCATION: [facility] PHYSICIAN: Almedia Balls. Ranell Patrick, MD  Operative Report   DATE OF PROCEDURE: 06/16/2023  PREOPERATIVE DIAGNOSIS:  Left shoulder end-stage arthritis.  POSTOPERATIVE DIAGNOSIS:  Left shoulder end-stage arthritis.  PROCEDURE PERFORMED:  Left reverse total shoulder arthroplasty using DePuy Delta Xtend prosthesis with no subscap repair.  ATTENDING SURGEON:  Almedia Balls. Ranell Patrick, MD  ASSISTANT:  Konrad Felix Dixon, [place], who was scrubbed during the entire procedure, and necessary for satisfactory completion of surgery.  ANESTHESIA:  General anesthesia was used plus interscalene block.  ESTIMATED BLOOD LOSS:  100 mL.  FLUID REPLACEMENT:  1500 mL crystalloid.  COUNTS:  Instrument counts correct.  COMPLICATIONS:  There were no complications.  ANTIBIOTICS:  Perioperative antibiotics were given.  INDICATIONS:  The patient is a 77 year old female with worsening left shoulder pain secondary to longstanding end-stage arthritis.  The patient has failed an extended period of conservative management, desires operative treatment to eliminate pain and  restore function.  Informed consent obtained.  DESCRIPTION OF PROCEDURE:  After an adequate level of general anesthesia was achieved, the patient was positioned in modified beach chair position.  Left shoulder correctly identified and sterile prep and drape performed.  Timeout called, verifying  correct patient, correct site, we entered the patient's shoulder using standard deltopectoral approach, starting at the coracoid process extending down the anterior humerus.  Dissection down through subcutaneous tissues using Bovie.  Cephalic vein was  identified and taken laterally with the deltoid, pectoralis taken medially.  Conjoined tendon identified and retracted medially.  Deep retractors placed.  Biceps tenodesed in situ with  0 Vicryl figure-of-eight suture x2 incorporating part of the subscap  tendon.  We then released the subscapularis off the lesser tuberosity and tagged for protection of the axillary nerve, we released the inferior capsule progressively externally rotating and extending the shoulder delivering the humeral head out of the  wound.  The patient had bone-on-bone arthritis with no cartilage remaining.  We entered the proximal humerus with a 6 mm reamer, reaming up to a size 10.  We then placed our 10 mm T-handle guide and resected the head at 20 degrees of retroversion with  the oscillating saw using the head resection guide.  We then removed excess osteophytes with a rongeur, removing the osteophytes all the way to the posterior shoulder.  We did take the head to the back table and used that for bone graft for the end of  the surgery.  We next we subluxed the humerus posteriorly, gaining good exposure of the glenoid face.  We removed the capsule, labrum, biceps anchor, getting good exposure. There was no cartilage remaining on the glenoid.  We placed our guide pin  centered low on the glenoid face and then reamed for the metaglene baseplate.  We then did our peripheral hand reaming and then drilled out the central peg hole for the metaglene baseplate.  We impacted the HA coated press-fit baseplate into position.   We placed a 42 screw inferiorly and a 30 screw superiorly at the base of the coracoid.  We had great purchase with both screws.  We then locked those into the baseplate.  Due to small AP diameter of the patient's glenoid, we only used 2 screws.  Next, we  placed a 38 standard plus 0 glenosphere on the baseplate and secured that with a screwdriver.  I did a finger sweep to make sure we had  no soft tissue caught up between the baseplate and the glenosphere.  Next, we went back to the humeral side and  reamed for the one left metaphysis.  We then placed the 10 stem, 1 left metaphysis set on the 0 setting  and placed in 20 degrees of retroversion impacting that into the humerus.  We reduced the shoulder with 38+3 poly.  We felt like we probably get a +6  for the real.  We then removed the trial components from the humeral side, irrigated well.  We used available bone graft from the humeral head in impaction grafting technique and used our press-fit HA coated 10 stem with the HA coated 1 left metaphysis  set on the 0 setting and impacted in 20 degrees of retroversion with bone graft. We had great stem security.  We then selected the 38+6 poly placed on the humeral tray and impacted that in position.  We then reduced the shoulder, had nice little pop as  it reduced and appropriate tension with the arm at the patient's side.  No gapping with external rotation or inferior pole conjoined was appropriately tight.  At this point, we irrigated thoroughly.  We then resected the subscap remnant.  We irrigated  and then repaired deltopectoral interval with 0 Vicryl suture followed by 2-0 Vicryl for subcutaneous closure and 4-0 Monocryl for skin.  Steri-Strips applied followed by sterile dressing.  The patient tolerated surgery well.   PUS D: 06/16/2023 4:41:25 pm T: 06/16/2023 4:56:00 pm  JOB: [id number]/ [id number]

## 2023-06-16 NOTE — Anesthesia Procedure Notes (Signed)
Procedure Name: Intubation Date/Time: 06/16/2023 3:12 PM  Performed by: Garth Bigness, CRNAPre-anesthesia Checklist: Patient identified, Emergency Drugs available, Suction available and Patient being monitored Patient Re-evaluated:Patient Re-evaluated prior to induction Oxygen Delivery Method: Circle system utilized Preoxygenation: Pre-oxygenation with 100% oxygen Induction Type: IV induction Ventilation: Mask ventilation without difficulty Laryngoscope Size: Mac and 3 Grade View: Grade II Tube type: Oral Tube size: 7.0 mm Number of attempts: 1 Airway Equipment and Method: Stylet Placement Confirmation: ETT inserted through vocal cords under direct vision, positive ETCO2 and breath sounds checked- equal and bilateral Secured at: 22 cm Tube secured with: Tape Dental Injury: Teeth and Oropharynx as per pre-operative assessment

## 2023-06-16 NOTE — Brief Op Note (Signed)
06/16/2023  4:36 PM  PATIENT:  Barbarann Ehlers  77 y.o. 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, end stage  PROCEDURE:  Procedure(s): REVERSE SHOULDER ARTHROPLASTY (Left) DePuy Delta Xtend with NO subscap repair  SURGEON:  Surgeons and Role:    Beverely Low, MD - Primary  PHYSICIAN ASSISTANT:   ASSISTANTS: Thea Gist, PA-C   ANESTHESIA:   regional and general  EBL:  100 mL   BLOOD ADMINISTERED:none  DRAINS: none   LOCAL MEDICATIONS USED:  MARCAINE     SPECIMEN:  No Specimen  DISPOSITION OF SPECIMEN:  N/A  COUNTS:  YES  TOURNIQUET:  * No tourniquets in log *  DICTATION: .Other Dictation: Dictation Number [id number]  PLAN OF CARE: Admit for overnight observation  PATIENT DISPOSITION:  PACU - hemodynamically stable.   Delay start of Pharmacological VTE agent (>24hrs) due to surgical blood loss or risk of bleeding: not applicable

## 2023-06-16 NOTE — Anesthesia Postprocedure Evaluation (Signed)
Anesthesia Post Note  Patient: Brooke Duncan  Procedure(s) Performed: REVERSE SHOULDER ARTHROPLASTY (Left: Shoulder)     Patient location during evaluation: PACU Anesthesia Type: General Level of consciousness: awake and alert Pain management: pain level controlled Vital Signs Assessment: post-procedure vital signs reviewed and stable Respiratory status: spontaneous breathing, nonlabored ventilation, respiratory function stable and patient connected to nasal cannula oxygen Cardiovascular status: blood pressure returned to baseline and stable Postop Assessment: no apparent nausea or vomiting Anesthetic complications: no  No notable events documented.  Last Vitals:  Vitals:   06/16/23 1730 06/16/23 1836  BP: 137/71 124/77  Pulse: 87 88  Resp: 19 20  Temp:  36.6 C  SpO2: 93% 94%    Last Pain:  Vitals:   06/16/23 1730  TempSrc:   PainSc: 2                  Shelton Silvas

## 2023-06-16 NOTE — Interval H&P Note (Signed)
History and Physical Interval Note:  06/16/2023 2:26 PM  Brooke Duncan  has presented today for surgery, with the diagnosis of osteoarthritis of joint of left shoulder region.  The various methods of treatment have been discussed with the patient and family. After consideration of risks, benefits and other options for treatment, the patient has consented to  Procedure(s): REVERSE SHOULDER ARTHROPLASTY (Left) as a surgical intervention.  The patient's history has been reviewed, patient examined, no change in status, stable for surgery.  I have reviewed the patient's chart and labs.  Questions were answered to the patient's satisfaction.     Verlee Rossetti

## 2023-06-16 NOTE — Plan of Care (Signed)
  Problem: Pain Management: Goal: Pain level will decrease with appropriate interventions Outcome: Progressing   Problem: Coping: Goal: Level of anxiety will decrease Outcome: Progressing   

## 2023-06-16 NOTE — Progress Notes (Signed)
   06/16/23 2243  BiPAP/CPAP/SIPAP  Reason BIPAP/CPAP not in use Other(comment) (PT normally uses at home, but feels she is okay with just using 02 tonight.)

## 2023-06-16 NOTE — Transfer of Care (Signed)
Immediate Anesthesia Transfer of Care Note  Patient: Brooke Duncan  Procedure(s) Performed: Procedure(s): REVERSE SHOULDER ARTHROPLASTY (Left)  Patient Location: PACU  Anesthesia Type:General  Level of Consciousness: Alert, Awake, Oriented  Airway & Oxygen Therapy: Patient Spontanous Breathing  Post-op Assessment: Report given to RN  Post vital signs: Reviewed and stable  Last Vitals:  Vitals:   06/16/23 1715 06/16/23 1730  BP: 136/75 137/71  Pulse: 83 87  Resp: 17 19  Temp:    SpO2: 92% 93%    Complications: No apparent anesthesia complications

## 2023-06-16 NOTE — Anesthesia Procedure Notes (Signed)
Anesthesia Regional Block: Interscalene brachial plexus block   Pre-Anesthetic Checklist: , timeout performed,  Correct Patient, Correct Site, Correct Laterality,  Correct Procedure, Correct Position, site marked,  Risks and benefits discussed,  Surgical consent,  Pre-op evaluation,  At surgeon's request and post-op pain management  Laterality: Left  Prep: chloraprep       Needles:  Injection technique: Single-shot  Needle Type: Echogenic Stimulator Needle     Needle Length: 9cm  Needle Gauge: 21     Additional Needles:   Procedures:,,,, ultrasound used (permanent image in chart),,    Narrative:  Start time: 06/16/2023 1:20 PM End time: 06/16/2023 1:25 PM Injection made incrementally with aspirations every 5 mL.  Performed by: Personally  Anesthesiologist: Shelton Silvas, MD  Additional Notes: Discussed risks and benefits of the nerve block in detail, including but not limited vascular injury, permanent nerve damage and infection.   Patient tolerated the procedure well. Local anesthetic introduced in an incremental fashion under minimal resistance after negative aspirations. No paresthesias were elicited. After completion of the procedure, no acute issues were identified and patient continued to be monitored by RN.

## 2023-06-16 NOTE — Care Plan (Signed)
Ortho Bundle Case Management Note  Patient Details  Name: Brooke Duncan MRN: [id number] Date of Birth: 08/31/1946                   L Rev TSA on 06/16/23.  DCP: Home with niece Tammy.  DME: No needs.  PT: HEP     Additional Comments: Please contact me with any questions of if this plan should need to change.     Despina Pole, Case Manager  [facility]  [phone] 06/16/2023, 2:35 PM

## 2023-06-16 NOTE — Anesthesia Preprocedure Evaluation (Addendum)
Anesthesia Evaluation  Patient identified by MRN, date of birth, ID band Patient awake    Reviewed: Allergy & Precautions, NPO status , Patient's Chart, lab work & pertinent test results  Airway Mallampati: II  TM Distance: >3 FB Neck ROM: Full    Dental  (+) Teeth Intact, Dental Advisory Given   Pulmonary sleep apnea , former smoker   breath sounds clear to auscultation       Cardiovascular hypertension, Pt. on medications  Rhythm:Regular Rate:Normal     Neuro/Psych  Headaches    GI/Hepatic negative GI ROS, Neg liver ROS,,,  Endo/Other  Hypothyroidism    Renal/GU Renal disease     Musculoskeletal  (+) Arthritis ,    Abdominal   Peds  Hematology negative hematology ROS (+)   Anesthesia Other Findings   Reproductive/Obstetrics                             Anesthesia Physical Anesthesia Plan  ASA: 3  Anesthesia Plan: General   Post-op Pain Management: Regional block*   Induction: Intravenous  PONV Risk Score and Plan: 4 or greater and Ondansetron, Dexamethasone and Treatment may vary due to age or medical condition  Airway Management Planned: Oral ETT  Additional Equipment: None  Intra-op Plan:   Post-operative Plan: Extubation in OR  Informed Consent: I have reviewed the patients History and Physical, chart, labs and discussed the procedure including the risks, benefits and alternatives for the proposed anesthesia with the patient or authorized representative who has indicated his/her understanding and acceptance.     Dental advisory given  Plan Discussed with: CRNA  Anesthesia Plan Comments:        Anesthesia Quick Evaluation

## 2023-06-17 DIAGNOSIS — M19012 Primary osteoarthritis, left shoulder: Secondary | ICD-10-CM | POA: Diagnosis not present

## 2023-06-17 NOTE — Discharge Summary (Signed)
Patient ID: Brooke Duncan MRN: [id number] DOB/AGE: [birth year] 77 y.o.  Admit date: 06/16/2023 Discharge date: 06/17/2023  Primary Diagnosis: Left shoulder rotator cuff arthropathy Admission Diagnoses:  Past Medical History:  Diagnosis Date   Acquired solitary kidney    right side 2014   Arthritis    Bronchitis 05/08/2023   Cancer (HCC)    renal cell carcinoma- left kidney removed   Complication of anesthesia    slow to wake, hard to take a deep breath with 1 surgery several yrs ago   Frequency of urination    Headache    Heart murmur    per told by pcp approx. 02/ 2022 heard a very faint murmur, told did need work-up done at this time   History of COVID-19 2021   all symptoms resolved   History of Hashimoto thyroiditis    s/p  total thyroidectomy 1995   History of hypercalcemia    s/p  parathryoidectomy 07/ 2021   History of renal cell carcinoma 2014   s/p  left nephrectomy, pt stated no other treatment   Hypertension    followed by pcp   Hypothyroidism, postsurgical 1995   followed by pcp in [place], moved from [place] 01/ 2021   OSA on CPAP    Thickened endometrium    UTI (urinary tract infection)    finished keflex 08-12-2022   Wears glasses    Discharge Diagnoses:   Principal Problem:   H/O total shoulder replacement, left  Estimated body mass index is 42.61 kg/m as calculated from the following:   Height as of this encounter: 5\' 6"  (1.676 m).   Weight as of this encounter: 119.7 kg.  Procedure:  Procedure(s) (LRB): REVERSE SHOULDER ARTHROPLASTY (Left)   Consults: None  HPI: Admitted for postoperative care following left reverse shoulder arthroplasty. Laboratory Data: Hospital Outpatient Visit on 06/12/2023  Component Date Value Ref Range Status   Sodium 06/12/2023 140  135 - 145 mmol/L Final   Potassium 06/12/2023 4.5  3.5 - 5.1 mmol/L Final   Chloride 06/12/2023 106  98 - 111 mmol/L Final   CO2 06/12/2023 27  22 - 32 mmol/L Final   Glucose, Bld  06/12/2023 103 (H)  70 - 99 mg/dL Final   Glucose reference range applies only to samples taken after fasting for at least 8 hours.   BUN 06/12/2023 18  8 - 23 mg/dL Final   Creatinine, Ser 06/12/2023 0.73  0.44 - 1.00 mg/dL Final   Calcium 69/62/9528 9.6  8.9 - 10.3 mg/dL Final   GFR, Estimated 06/12/2023 >60  >60 mL/min Final   Comment: (NOTE) Calculated using the CKD-EPI Creatinine Equation (2021)    Anion gap 06/12/2023 7  5 - 15 Final   Performed at [facility], [address] [place], [place] [address]   WBC 06/12/2023 7.0  4.0 - 10.5 K/uL Final   RBC 06/12/2023 5.25 (H)  3.87 - 5.11 MIL/uL Final   Hemoglobin 06/12/2023 14.0  12.0 - 15.0 g/dL Final   HCT 40/09/2724 46.2 (H)  36.0 - 46.0 % Final   MCV 06/12/2023 88.0  80.0 - 100.0 fL Final   MCH 06/12/2023 26.7  26.0 - 34.0 pg Final   MCHC 06/12/2023 30.3  30.0 - 36.0 g/dL Final   RDW 36/64/4034 16.2 (H)  11.5 - 15.5 % Final   Platelets 06/12/2023 169  150 - 400 K/uL Final   nRBC 06/12/2023 0.0  0.0 - 0.2 % Final   Performed at [facility], [address] [place], [place] [address]   MRSA, PCR 06/12/2023 NEGATIVE  NEGATIVE Final   Staphylococcus aureus 06/12/2023 NEGATIVE  NEGATIVE Final   Comment: (NOTE) The Xpert SA Assay (FDA approved for NASAL specimens in patients 76 years of age and older), is one component of a comprehensive surveillance program. It is not intended to diagnose infection nor to guide or monitor treatment. Performed at [facility], [address] [place], [place] [address]   Hospital Outpatient Visit on 04/24/2023  Component Date Value Ref Range Status   Sodium 04/24/2023 136  135 - 145 mmol/L Final   Potassium 04/24/2023 4.7  3.5 - 5.1 mmol/L Final   Chloride 04/24/2023 101  98 - 111 mmol/L Final   CO2 04/24/2023 26  22 - 32 mmol/L Final   Glucose, Bld 04/24/2023 92  70 - 99 mg/dL Final   Glucose reference range applies only to samples  taken after fasting for at least 8 hours.   BUN 04/24/2023 20  8 - 23 mg/dL Final   Creatinine, Ser 04/24/2023 0.83  0.44 - 1.00 mg/dL Final   Calcium 09/81/1914 9.5  8.9 - 10.3 mg/dL Final   GFR, Estimated 04/24/2023 >60  >60 mL/min Final   Comment: (NOTE) Calculated using the CKD-EPI Creatinine Equation (2021)    Anion gap 04/24/2023 9  5 - 15 Final   Performed at [facility], [address] [place], [place] [address]   WBC 04/24/2023 10.3  4.0 - 10.5 K/uL Final   RBC 04/24/2023 5.33 (H)  3.87 - 5.11 MIL/uL Final   Hemoglobin 04/24/2023 14.5  12.0 - 15.0 g/dL Final   HCT 62/13/0865 46.5 (H)  36.0 - 46.0 % Final   MCV 04/24/2023 87.2  80.0 - 100.0 fL Final   MCH 04/24/2023 27.2  26.0 - 34.0 pg Final   MCHC 04/24/2023 31.2  30.0 - 36.0 g/dL Final   RDW 78/46/9629 16.2 (H)  11.5 - 15.5 % Final   Platelets 04/24/2023 192  150 - 400 K/uL Final   nRBC 04/24/2023 0.0  0.0 - 0.2 % Final   Performed at [facility], [address] [place], [place] [address]   MRSA, PCR 04/24/2023 NEGATIVE  NEGATIVE Final   Staphylococcus aureus 04/24/2023 NEGATIVE  NEGATIVE Final   Comment: (NOTE) The Xpert SA Assay (FDA approved for NASAL specimens in patients 76 years of age and older), is one component of a comprehensive surveillance program. It is not intended to diagnose infection nor to guide or monitor treatment. Performed at [facility], [address] [place], [place] [address]      X-Rays:DG Shoulder Left Port  Result Date: 06/16/2023 CLINICAL DATA:  Shoulder replacement EXAM: LEFT SHOULDER COMPARISON:  CT 08/24/2022 FINDINGS: Left reverse shoulder replacement with intact hardware and normal alignment. Gas in the soft tissues consistent with recent surgery IMPRESSION: Status post left reverse shoulder replacement with expected postsurgical change. Electronically Signed   By: Jasmine Pang M.D.   On: 06/16/2023 20:46    EKG: Orders  placed or performed during the hospital encounter of 07/14/22   EKG 12 lead per protocol   EKG 12 lead per protocol     Hospital Course: Brooke Duncan is a 77 y.o. who was admitted to Hospital. They were brought to the operating room on 06/16/2023 and underwent Procedure(s): REVERSE SHOULDER ARTHROPLASTY.  Patient tolerated the procedure well and was later transferred to the recovery room and  then to the orthopaedic floor for postoperative care.  They were given PO and IV analgesics for pain control following their surgery.  They were given 24 hours of postoperative antibiotics of  Anti-infectives (From admission, onward)    Start     Dose/Rate Route Frequency Ordered Stop   06/17/23 1000  cephALEXin (KEFLEX) capsule 250 mg        250 mg Oral Daily 06/16/23 1838     06/16/23 2330  ceFAZolin (ANCEF) IVPB 2g/100 mL premix        2 g 200 mL/hr over 30 Minutes Intravenous Every 6 hours 06/16/23 2244 06/17/23 1205   06/16/23 1230  ceFAZolin (ANCEF) IVPB 2g/100 mL premix        2 g 200 mL/hr over 30 Minutes Intravenous On call to O.R. 06/16/23 1226 06/16/23 1530      and started on DVT prophylaxis in the form of Aspirin.   PT and OT were ordered for total joint protocol.  , the patient had progressed with therapy and meeting their goals.  Incision was healing well.  Patient was seen in rounds and was ready to go home.   Diet: Regular diet Activity:NWB Follow-up:in 2 weeks Disposition - Home Discharged Condition: good   Discharge Instructions     Call MD / Call 911   Complete by: As directed    If you experience chest pain or shortness of breath, CALL 911 and be transported to the hospital emergency room.  If you develope a fever above 101 F, pus (white drainage) or increased drainage or redness at the wound, or calf pain, call your surgeon's office.   Constipation Prevention   Complete by: As directed    Drink plenty of fluids.  Prune juice may be helpful.  You may use a stool  softener, such as Colace (over the counter) 100 mg twice a day.  Use MiraLax (over the counter) for constipation as needed.   Diet - low sodium heart healthy   Complete by: As directed    Increase activity slowly as tolerated   Complete by: As directed    Post-operative opioid taper instructions:   Complete by: As directed    POST-OPERATIVE OPIOID TAPER INSTRUCTIONS: It is important to wean off of your opioid medication as soon as possible. If you do not need pain medication after your surgery it is ok to stop day one. Opioids include: Codeine, Hydrocodone(Norco, Vicodin), Oxycodone(Percocet, oxycontin) and hydromorphone amongst others.  Long term and even short term use of opiods can cause: Increased pain response Dependence Constipation Depression Respiratory depression And more.  Withdrawal symptoms can include Flu like symptoms Nausea, vomiting And more Techniques to manage these symptoms Hydrate well Eat regular healthy meals Stay active Use relaxation techniques(deep breathing, meditating, yoga) Do Not substitute Alcohol to help with tapering If you have been on opioids for less than two weeks and do not have pain than it is ok to stop all together.  Plan to wean off of opioids This plan should start within one week post op of your joint replacement. Maintain the same interval or time between taking each dose and first decrease the dose.  Cut the total daily intake of opioids by one tablet each day Next start to increase the time between doses. The last dose that should be eliminated is the evening dose.         Allergies as of 06/17/2023       Reactions   Wound Dressing Adhesive Rash  Adhesive tape causes rash, can use papee tape   Tape Rash, Other (See Comments)   "Blistery rash" DERMABOND        Medication List     TAKE these medications    acetaminophen 500 MG tablet Commonly known as: TYLENOL Take 2 tablets (1,000 mg total) by mouth every 6 (six)  hours as needed. Notes to patient: Resume home regimen   albuterol 108 (90 Base) MCG/ACT inhaler Commonly known as: VENTOLIN HFA Inhale 2 puffs into the lungs every 4 (four) hours as needed for shortness of breath or wheezing. Notes to patient: Resume home regimen   cephALEXin 250 MG capsule Commonly known as: KEFLEX Take 250 mg by mouth daily.   cholestyramine 4 g packet Commonly known as: Questran Take one pack PO BID   clotrimazole 1 % cream Commonly known as: LOTRIMIN Apply 1 application  topically 2 (two) times daily as needed (irritation). Notes to patient: Resume home regimen   CRANBERRY PO Take 2 capsules by mouth daily. Notes to patient: Resume home regimen   cyanocobalamin 1000 MCG tablet Commonly known as: VITAMIN B12 Take 1,000 mcg by mouth daily.   D-MANNOSE PO Take 4 capsules by mouth daily. Notes to patient: Resume home regimen   erythromycin ophthalmic ointment Place 1 application  into both eyes daily as needed (blepharitis). Notes to patient: Resume home regimen   HYDROcodone-acetaminophen 5-325 MG tablet Commonly known as: NORCO/VICODIN Take 1-2 tablets by mouth every 6 (six) hours as needed for moderate pain or severe pain. Notes to patient: Last dose given 07/13 12:36am   hydrocortisone 2.5 % cream Apply 1 Application topically 2 (two) times daily as needed (irritation). Notes to patient: Resume home regimen   ibuprofen 600 MG tablet Commonly known as: ADVIL Take 1 tablet (600 mg total) by mouth every 6 (six) hours as needed. Notes to patient: Resume home regimen   letrozole 2.5 MG tablet Commonly known as: FEMARA Take 1 tablet (2.5 mg total) by mouth daily.   levothyroxine 150 MCG tablet Commonly known as: SYNTHROID Take 150 mcg by mouth daily before breakfast.   losartan 50 MG tablet Commonly known as: COZAAR Take 50 mg by mouth daily.   methocarbamol 500 MG tablet Commonly known as: ROBAXIN Take 1 tablet (500 mg total) by mouth  every 8 (eight) hours as needed for muscle spasms. Notes to patient: Last dose given 07/13 12:36am   NON FORMULARY Pt uses a cpap nightly   ondansetron 4 MG tablet Commonly known as: Zofran Take 1 tablet (4 mg total) by mouth every 8 (eight) hours as needed for vomiting, nausea or refractory nausea / vomiting. Notes to patient: Last dose given 07/12 03:30pm   oxybutynin 5 MG tablet Commonly known as: DITROPAN Take 1 tablet (5 mg total) by mouth every 8 (eight) hours as needed for bladder spasms. Notes to patient: Resume home regimen   phenazopyridine 200 MG tablet Commonly known as: Pyridium Take 1 tablet (200 mg total) by mouth 3 (three) times daily as needed (for pain with urination). Notes to patient: Resume home regimen   PROBIOTIC DAILY PO Take 1 capsule by mouth daily. Notes to patient: Resume home regimen   simvastatin 10 MG tablet Commonly known as: ZOCOR Take 10 mg by mouth at bedtime.   tamsulosin 0.4 MG Caps capsule Commonly known as: FLOMAX Take 1 capsule (0.4 mg total) by mouth daily. What changed: when to take this   VITAMIN C PO Take 1 tablet by mouth daily.  Vitamin D3 Super Strength 50 MCG (2000 UT) Caps Generic drug: Cholecalciferol Take 2,000 Units by mouth daily.        Follow-up Information     Beverely Low, MD. Schedule an appointment as soon as possible for a visit in 2 week(s).   Specialty: Orthopedic Surgery Contact information: [name] [place] [address] [place] [address] [phone]         Beverely Low, MD. Call in 2 week(s).   Specialty: Orthopedic Surgery Why: call [phone] for appt Contact information: [address] [place] [address] [place] [address] [phone]                 Signed: Maryan Rued, MD Orthopaedic Surgery 06/17/2023, 3:48 PM

## 2023-06-17 NOTE — Progress Notes (Signed)
Provided discharge education/instructions, all questions and concerns addressed. Pt not in any distress, to discharge home with all of her belongings.

## 2023-06-17 NOTE — Evaluation (Signed)
Physical Therapy One Time Evaluation Patient Details Name: Brooke Duncan MRN: [id number] DOB: 01-May-1946 Today's Date: 06/17/2023  History of Present Illness  Pt is a 77 y/o F s/p Left TSA on 7/12. PMH includes arthritis, cancer, heart murmur, hypercalcemia, HTN, OSA on CPAP, L TKA, bil THA.  Clinical Impression  Patient evaluated by Physical Therapy with no further acute PT needs identified. All education has been completed and the patient has no further questions.  Pt assisted to bathroom per request and then ambulated in hallway.  Pt educated on use of SPC as well as safely using rollator (she prefers rollator due to familiarity of device).  Pt states she will have 24/7 assist upon d/c for 2 weeks. Pt anticipates d/c home today.  PT is signing off. Thank you for this referral.         Assistance Recommended at Discharge PRN  If plan is discharge home, recommend the following:  Can travel by private vehicle  A little help with bathing/dressing/bathroom;A little help with walking and/or transfers;Help with stairs or ramp for entrance        Equipment Recommendations None recommended by PT  Recommendations for Other Services       Functional Status Assessment Patient has had a recent decline in their functional status and demonstrates the ability to make significant improvements in function in a reasonable and predictable amount of time.     Precautions / Restrictions Precautions Precautions: Fall;Shoulder Type of Shoulder Precautions: NWB, ok for elbow/wrist/hand ROM, no shoulder A/PROM Shoulder Interventions: Shoulder sling/immobilizer;Off for dressing/bathing/exercises;At all times Required Braces or Orthoses: Sling Restrictions Weight Bearing Restrictions: Yes LUE Weight Bearing: Non weight bearing      Mobility  Bed Mobility               General bed mobility comments: pt in recliner    Transfers Overall transfer level: Needs assistance Equipment used:  Straight cane Transfers: Sit to/from Stand Sit to Stand: Min guard           General transfer comment: cues for right hand positioning    Ambulation/Gait Ambulation/Gait assistance: Min guard Gait Distance (Feet): 60 Feet Assistive device: Rolling walker (2 wheels), Straight cane Gait Pattern/deviations: Step-to pattern Gait velocity: decr     General Gait Details: slow and cautious, educated on use of SPC, pt used SPC for 30 ft, also trialed her with her rollator as she reports feeling more comfortable with rollator, provided safety recommendations for if using rollator could use brake on left side to slow down (but that would also likely break down the brake faster) or have her assist at home steer left side; pt plans to move slowly initially for safety  Stairs            Wheelchair Mobility     Tilt Bed    Modified Rankin (Stroke Patients Only)       Balance Overall balance assessment: Needs assistance         Standing balance support: No upper extremity supported Standing balance-Leahy Scale: Fair Standing balance comment: static fair, dynamic poor - utilizes right UE support                             Pertinent Vitals/Pain Pain Assessment Pain Assessment: No/denies pain    Home Living Family/patient expects to be discharged to:: Private residence Living Arrangements: Alone;Other (Comment) (niece is coming to help for a few weeks, close to 24/7  assist) Available Help at Discharge: Family;Other (Comment) (close to 24/7 assist) Type of Home: House Home Access: Stairs to enter   Entrance Stairs-Number of Steps: 1, threshold   Home Layout: One level Home Equipment: Grab bars - tub/shower;Rollator (4 wheels);Shower seat;Cane - single point      Prior Function Prior Level of Function : Independent/Modified Independent;Driving             Mobility Comments: uses a rollator ADLs Comments: ind     Hand Dominance   Dominant Hand:  Right    Extremity/Trunk Assessment        Lower Extremity Assessment Lower Extremity Assessment: Generalized weakness    Cervical / Trunk Assessment Cervical / Trunk Assessment: Normal  Communication   Communication: No difficulties  Cognition Arousal/Alertness: Awake/alert Behavior During Therapy: WFL for tasks assessed/performed Overall Cognitive Status: Within Functional Limits for tasks assessed                                          General Comments      Exercises     Assessment/Plan    PT Assessment Patient does not need any further PT services  PT Problem List         PT Treatment Interventions      PT Goals (Current goals can be found in the Care Plan section)  Acute Rehab PT Goals PT Goal Formulation: All assessment and education complete, DC therapy    Frequency       Co-evaluation               AM-PAC PT "6 Clicks" Mobility  Outcome Measure Help needed turning from your back to your side while in a flat bed without using bedrails?: A Little Help needed moving from lying on your back to sitting on the side of a flat bed without using bedrails?: A Little Help needed moving to and from a bed to a chair (including a wheelchair)?: A Little Help needed standing up from a chair using your arms (e.g., wheelchair or bedside chair)?: A Little Help needed to walk in hospital room?: A Little Help needed climbing 3-5 steps with a railing? : A Little 6 Click Score: 18    End of Session Equipment Utilized During Treatment: Gait belt Activity Tolerance: Patient tolerated treatment well Patient left: in chair;with call bell/phone within reach   PT Visit Diagnosis: Difficulty in walking, not elsewhere classified (R26.2)    Time: 1610-9604 PT Time Calculation (min) (ACUTE ONLY): 14 min   Charges:   PT Evaluation $PT Eval Low Complexity: 1 Low   PT General Charges $$ ACUTE PT VISIT: 1 Visit        Thomasene Mohair PT, DPT Physical  Therapist Acute Rehabilitation Services Office: [phone]   Janan Halter [place] 06/17/2023, 12:16 PM

## 2023-06-17 NOTE — Progress Notes (Signed)
   Subjective:  Patient reports pain as mild.  Doing very well this morning.  Up out of bed and working with OT.  No complaints of any overnight issues.  Objective:   VITALS:   Vitals:   06/16/23 2026 06/17/23 0039 06/17/23 0555 06/17/23 0916  BP: (!) 140/65 130/64 125/72 137/74  Pulse: 88 79 67 73  Resp: 20 16 20 18   Temp: 98.1 F (36.7 C) 98.3 F (36.8 C) 97.9 F (36.6 C) 98.2 F (36.8 C)  TempSrc: Oral Oral  Oral  SpO2: 93% 92% 97% 94%  Weight:      Height:        Neurologically intact Neurovascular intact Intact pulses distally Incision: dressing C/D/I Compartment soft Sling donned on left arm  Lab Results  Component Value Date   WBC 7.0 06/12/2023   HGB 14.0 06/12/2023   HCT 46.2 (H) 06/12/2023   MCV 88.0 06/12/2023   PLT 169 06/12/2023   BMET    Component Value Date/Time   NA 140 06/12/2023 1039   K 4.5 06/12/2023 1039   CL 106 06/12/2023 1039   CO2 27 06/12/2023 1039   GLUCOSE 103 (H) 06/12/2023 1039   BUN 18 06/12/2023 1039   CREATININE 0.73 06/12/2023 1039   CREATININE 0.77 10/28/2022 1921   CALCIUM 9.6 06/12/2023 1039   EGFR 80 10/28/2022 1921   GFRNONAA >60 06/12/2023 1039   GFRNONAA >60 09/01/2021 1244     Assessment/Plan: 1 Day Post-Op   Principal Problem:   H/O total shoulder replacement, left   Up with therapy -She does use a walker at baseline we will have her work with physical therapy this morning to ensure she is safe to go home.  Otherwise plan for discharge home today.  -Postoperative precautions per Dr. Nelida Meuse protocol.  -Follow-up in 2 weeks with Dr. Ranell Patrick or Nida Boatman.   Brooke Duncan 06/17/2023, 9:45 AM   Maryan Rued, MD [phone]

## 2023-06-17 NOTE — Evaluation (Signed)
Occupational Therapy Evaluation Patient Details Name: Brooke Duncan MRN: [id number] DOB: 05/30/1946 Today's Date: 06/17/2023   History of Present Illness Pt is a 77 y/o F s/p L R TSA on 7/12. PMH includes arthritis, cancer, heart murmur, hypercalcemia, HTN, OSA on CPAP, L TKA, bil THA.   Clinical Impression   Pt ind at baseline with ADLs, uses rollator for mobility. Pt lives alone but plans to have her niece stay with her for a few weeks at d/c, can provide frequent assist. Pt currently needing set up - mod A for ADLs, min A for bed mobility and min A for transfers (short distances) with RUE holding onto rollator. Pt educated on shoulder precautions, sling wear schedule, compensatory strategies for ADLs/mobility and elbow/wrist/hand exercises (handout provided), pt verbalized/demo'd understanding during session. Pt presenting with impairments listed below, will follow acutely. Follow physician recommendations for follow up therapies.     Recommendations for follow up therapy are one component of a multi-disciplinary discharge planning process, led by the attending physician.  Recommendations may be updated based on patient status, additional functional criteria and insurance authorization.   Assistance Recommended at Discharge Frequent or constant Supervision/Assistance  Patient can return home with the following A little help with walking and/or transfers;A lot of help with bathing/dressing/bathroom;Assistance with cooking/housework;Assist for transportation;Help with stairs or ramp for entrance    Functional Status Assessment  Patient has had a recent decline in their functional status and demonstrates the ability to make significant improvements in function in a reasonable and predictable amount of time.  Equipment Recommendations  None recommended by OT (pt has needed DME)    Recommendations for Other Services PT consult     Precautions / Restrictions Precautions Precautions:  Fall;Shoulder Type of Shoulder Precautions: NWB, ok for elbow/wrist/hand ROM, no shoulder A/PROM Shoulder Interventions: Shoulder sling/immobilizer;Off for dressing/bathing/exercises;At all times Precaution Booklet Issued: Yes (comment) Precaution Comments: educated on shoulder precautions, comp strategies for ADL, ice machine ( if applicable) Required Braces or Orthoses: Sling Restrictions Weight Bearing Restrictions: Yes LUE Weight Bearing: Non weight bearing      Mobility Bed Mobility Overal bed mobility: Needs Assistance Bed Mobility: Sidelying to Sit   Sidelying to sit: Min assist       General bed mobility comments: min A for trunk elevation with HOB elevated    Transfers Overall transfer level: Needs assistance Equipment used: Rollator (4 wheels) Transfers: Sit to/from Stand Sit to Stand: Min assist           General transfer comment: with RUE on rollator only      Balance Overall balance assessment: Needs assistance Sitting-balance support: Feet supported Sitting balance-Leahy Scale: Good Sitting balance - Comments: reaches outside BOS without LOB   Standing balance support: During functional activity Standing balance-Leahy Scale: Poor Standing balance comment: reliant on external support                           ADL either performed or assessed with clinical judgement   ADL Overall ADL's : Needs assistance/impaired Eating/Feeding: Set up   Grooming: Min guard   Upper Body Bathing: Moderate assistance   Lower Body Bathing: Moderate assistance   Upper Body Dressing : Moderate assistance   Lower Body Dressing: Moderate assistance   Toilet Transfer: Minimal assistance;Ambulation;Regular Toilet   Toileting- Architect and Hygiene: Supervision/safety       Functional mobility during ADLs: Minimal assistance       Vision   Vision  Assessment?: No apparent visual deficits     Perception Perception Perception Tested?:  No   Praxis Praxis Praxis tested?: Not tested    Pertinent Vitals/Pain Pain Assessment Pain Assessment: No/denies pain     Hand Dominance Right   Extremity/Trunk Assessment Upper Extremity Assessment Upper Extremity Assessment: LUE deficits/detail LUE Deficits / Details: s/p L TSA, nerve block intact LUE: Unable to fully assess due to immobilization LUE Sensation:  (nerve block intact) LUE Coordination: decreased fine motor;decreased gross motor   Lower Extremity Assessment Lower Extremity Assessment: Generalized weakness   Cervical / Trunk Assessment Cervical / Trunk Assessment: Normal   Communication Communication Communication: No difficulties   Cognition Arousal/Alertness: Awake/alert Behavior During Therapy: WFL for tasks assessed/performed Overall Cognitive Status: Within Functional Limits for tasks assessed                                       General Comments  VSS on RA    Exercises Exercises: General Upper Extremity, Shoulder General Exercises - Upper Extremity Elbow Flexion: AROM, Left, 10 reps, Seated Elbow Extension: AROM, Left, 10 reps, Seated Wrist Flexion: AROM, Left, 10 reps, Seated Wrist Extension: AROM, Left, 10 reps, Seated Digit Composite Flexion: AROM, Left, 10 reps, Seated   Shoulder Instructions Shoulder Instructions Donning/doffing shirt without moving shoulder: Moderate assistance Method for sponge bathing under operated UE: Minimal assistance Donning/doffing sling/immobilizer: Moderate assistance Correct positioning of sling/immobilizer: Minimal assistance ROM for elbow, wrist and digits of operated UE: Minimal assistance (will likely be supervision once nerve block wears off) Sling wearing schedule (on at all times/off for ADL's): Patient able to independently direct caregiver Proper positioning of operated UE when showering: Minimal assistance Positioning of UE while sleeping: Minimal assistance    Home Living  Family/patient expects to be discharged to:: Private residence Living Arrangements: Alone;Other (Comment) (niece is coming to help for a few weeks, close to 24/7 assist) Available Help at Discharge: Family;Other (Comment) (close to 24/7 assist) Type of Home: House Home Access: Stairs to enter [employer] of Steps: 1, threshold   Home Layout: One level     Bathroom Shower/Tub: Producer, television/film/video: Handicapped height     Home Equipment: Grab bars - tub/shower;Rollator (4 wheels);Shower seat;Cane - single point          Prior Functioning/Environment Prior Level of Function : Independent/Modified Independent;Driving             Mobility Comments: uses a rollator ADLs Comments: ind        OT Problem List: Decreased strength;Decreased range of motion;Decreased activity tolerance;Impaired balance (sitting and/or standing);Impaired UE functional use      OT Treatment/Interventions: Self-care/ADL training;Therapeutic exercise;Energy conservation;DME and/or AE instruction;Therapeutic activities;Balance training;Patient/family education    OT Goals(Current goals can be found in the care plan section) Acute Rehab OT Goals Patient Stated Goal: none stated OT Goal Formulation: With patient Time For Goal Achievement: 07/01/23 Potential to Achieve Goals: Good ADL Goals Pt Will Perform Upper Body Dressing: with supervision;sitting Pt Will Perform Lower Body Dressing: with supervision;sitting/lateral leans;sit to/from stand Pt Will Transfer to Toilet: with supervision;ambulating;regular height toilet Pt/caregiver will Perform Home Exercise Program: Left upper extremity;With written HEP provided;With Supervision  OT Frequency: Min 1X/week    Co-evaluation              AM-PAC OT "6 Clicks" Daily Activity     Outcome Measure Help from another person  eating meals?: A Little Help from another person taking care of personal grooming?: A Little Help from  another person toileting, which includes using toliet, bedpan, or urinal?: A Little Help from another person bathing (including washing, rinsing, drying)?: A Lot Help from another person to put on and taking off regular upper body clothing?: A Lot Help from another person to put on and taking off regular lower body clothing?: A Lot 6 Click Score: 15   End of Session Equipment Utilized During Treatment: Other (comment) (L sling) Nurse Communication: Mobility status  Activity Tolerance: Patient tolerated treatment well Patient left: in bed;with call bell/phone within reach;with nursing/sitter in room  OT Visit Diagnosis: Unsteadiness on feet (R26.81);Other abnormalities of gait and mobility (R26.89);Muscle weakness (generalized) (M62.81)                Time: 9811-9147 OT Time Calculation (min): 58 min Charges:  OT General Charges $OT Visit: 1 Visit OT Evaluation $OT Eval Moderate Complexity: 1 Mod OT Treatments $Self Care/Home Management : 38-52 mins  Carver Fila, OTD, OTR/L SecureChat Preferred Acute Rehab [phone]   Carver Fila Koonce 06/17/2023, 9:16 AM

## 2023-06-17 NOTE — Plan of Care (Signed)
  Problem: Education: Goal: Knowledge of the prescribed therapeutic regimen will improve Outcome: Progressing   Problem: Activity: Goal: Ability to tolerate increased activity will improve Outcome: Progressing   Problem: Pain Management: Goal: Pain level will decrease with appropriate interventions Outcome: Progressing   

## 2023-06-19 ENCOUNTER — Encounter (HOSPITAL_COMMUNITY): Payer: Self-pay | Admitting: Orthopedic Surgery

## 2023-07-05 ENCOUNTER — Ambulatory Visit (HOSPITAL_COMMUNITY): Admission: RE | Admit: 2023-07-05 | Payer: Medicare Other | Source: Ambulatory Visit

## 2023-07-10 ENCOUNTER — Ambulatory Visit (HOSPITAL_COMMUNITY)
Admission: RE | Admit: 2023-07-10 | Discharge: 2023-07-10 | Disposition: A | Discharge status: Home / Self Care | Payer: Medicare Other | Source: Ambulatory Visit | Attending: Vascular Surgery | Admitting: Vascular Surgery

## 2023-07-10 ENCOUNTER — Other Ambulatory Visit (HOSPITAL_COMMUNITY): Payer: Self-pay | Admitting: Orthopedic Surgery

## 2023-07-10 DIAGNOSIS — M79602 Pain in left arm: Secondary | ICD-10-CM

## 2023-07-12 ENCOUNTER — Telehealth: Payer: Self-pay | Admitting: Hematology and Oncology

## 2023-07-12 NOTE — Telephone Encounter (Signed)
Patient has just has shoulder surgery and needed to reschedule appointment due to be unable to drive at the moment; Patient is aware of rescheduled appointment times/dates

## 2023-07-19 ENCOUNTER — Inpatient Hospital Stay: Payer: Medicare Other | Admitting: Hematology and Oncology

## 2023-07-25 NOTE — Therapy (Addendum)
OUTPATIENT PHYSICAL THERAPY THORACOLUMBAR EVALUATION   Patient Name: MABRY HENTHORNE MRN: [id number] DOB:11-21-1946, 77 y.o., female Today's Date: 08/17/2023  END OF SESSION:  PT End of Session - 08/17/23 1252     Visit Number 1    Number of Visits 24    Date for PT Re-Evaluation 10/25/23    Authorization Type medicare + BC[employer] supplemental    Authorization - Visit Number 1    Progress Note Due on Visit 10    PT Start Time 1400    PT Stop Time 1445    PT Time Calculation (min) 45 min    Activity Tolerance Patient tolerated treatment well    Behavior During Therapy WFL for tasks assessed/performed             Past Medical History:  Diagnosis Date   Acquired solitary kidney    right side 2014   Arthritis    Bronchitis 05/08/2023   Cancer (HCC)    renal cell carcinoma- left kidney removed   Complication of anesthesia    slow to wake, hard to take a deep breath with 1 surgery several yrs ago   Frequency of urination    Headache    Heart murmur    per told by pcp approx. 02/ 2022 heard a very faint murmur, told did need work-up done at this time   History of COVID-19 2021   all symptoms resolved   History of Hashimoto thyroiditis    s/p  total thyroidectomy 1995   History of hypercalcemia    s/p  parathryoidectomy 07/ 2021   History of renal cell carcinoma 2014   s/p  left nephrectomy, pt stated no other treatment   Hypertension    followed by pcp   Hypothyroidism, postsurgical 1995   followed by pcp in [place], moved from [place] 01/ 2021   OSA on CPAP    Thickened endometrium    UTI (urinary tract infection)    finished keflex 08-12-2022   Wears glasses    Past Surgical History:  Procedure Laterality Date   BREAST BIOPSY Left 08/24/2021   BREAST LUMPECTOMY WITH RADIOACTIVE SEED LOCALIZATION Left 07/14/2022   Procedure: LEFT BREAST LUMPECTOMY WITH RADIOACTIVE SEED LOCALIZATION;  Surgeon: Emelia Loron, MD;  Location: [facility];  Service:  General;  Laterality: Left;   CYSTOSCOPY  08/04/2022   in dr winter office   CYSTOSCOPY WITH BIOPSY N/A 09/02/2022   Procedure: CYSTOSCOPY WITH BLADDER BIOPSY;  Surgeon: Rene Paci, MD;  Location: [facility];  Service: Urology;  Laterality: N/A;  ONLY NEEDS 30 MIN   HYSTEROSCOPY WITH D & C N/A 04/08/2021   Procedure: DILATATION AND CURETTAGE /HYSTEROSCOPY, EXCISION OF VAGINAL LESION;  Surgeon: Silverio Lay, MD;  Location: [facility];  Service: Gynecology;  Laterality: N/A;   LAPAROSCOPIC CHOLECYSTECTOMY  2005   NEPHRECTOMY Left 2014   PARATHYROIDECTOMY  07/ 2021  in [place]   REVERSE SHOULDER ARTHROPLASTY Left 06/16/2023   Procedure: REVERSE SHOULDER ARTHROPLASTY;  Surgeon: Beverely Low, MD;  Location: [facility];  Service: Orthopedics;  Laterality: Left;   TONSILLECTOMY  age 60   TOTAL HIP ARTHROPLASTY Bilateral left 2015;  right 2009   TOTAL KNEE ARTHROPLASTY Left 2014   TOTAL THYROIDECTOMY Bilateral 1995   WISDOM TOOTH EXTRACTION Bilateral    Patient Active Problem List   Diagnosis Date Noted   H/O total shoulder replacement, left 06/16/2023   Colonic mass 11/03/2022   Malignant neoplasm of upper-outer quadrant of left  breast in female, estrogen receptor positive (HCC) 08/27/2021   Abnormal ultrasound of endometrium 04/08/2021    PCP: Dr Daisy Floro  REFERRING PROVIDER: Dr Shireen Quan  REFERRING DIAG: Muscle spasm of back  Rationale for Evaluation and Treatment: Rehabilitation  THERAPY DIAG:  Other low back pain - Plan: PT plan of care cert/re-cert  Muscle spasm of back - Plan: PT plan of care cert/re-cert  Other symptoms and signs involving the musculoskeletal system - Plan: PT plan of care cert/re-cert  Muscle weakness (generalized) - Plan: PT plan of care cert/re-cert  Abnormal posture - Plan: PT plan of care cert/re-cert  ONSET DATE: 07/13/23  SUBJECTIVE:                                                                                                                                                                                            SUBJECTIVE STATEMENT: Patient reports that she had L total shoulder replacement 06/16/23 but has not started therapy for shoulder. She has been sleeping in her reclined since the L reverse TSA. The power was out 07/13/23 and she was unable to get out tf the recliner. She called the fire department to help her get out of the chair. The firefighter helping on the L pushed harder on the L and created increased pain in the L LB area. She was seen after 6 days and treated with antiinflammatory and muscle relaxants with some improvement but continues to have pain in the L LB area. She is no longer sleeping in the recliner but is now in the bed with head of the bed raised  PERTINENT HISTORY:  Arthritis; HTN; gall bladder removed; L TKA 2014; L posterior hip pain; L shoulder pain; L reverse TSA;  L breast cancer continues on estrogen inhibitor treatment   PAIN:  Are you having pain? Yes: NPRS scale: 5-6/10; at worst 8/10 Pain location: L LB  Pain description: aching Aggravating factors: movement; walking; bending  Relieving factors: meds; heat   PRECAUTIONS: L reverse TSA 06/16/23 limited with shoulder flexion; abduction; extension - can lift cup of glass   RED FLAGS: None   WEIGHT BEARING RESTRICTIONS: No  FALLS:  Has patient fallen in last 6 months? No  LIVING ENVIRONMENT: Lives with: lives with their family and lives alone Lives in: House/apartment Stairs: No Has following equipment at home: Dan Humphreys - 4 wheeled, shower chair, bed side commode, Grab bars, and Ramped entry  OCCUPATION: retired from office work ~ 2007; has help for household chores; Animator, sewing, quilting, book club, family   PLOF: Independent with basic ADLs  PATIENT GOALS: reduce or eliminate back discomfort  NEXT MD VISIT: no return appt with Dr Ladona Horns; Dr Ranell Patrick 08/02/23 for shoulder    OBJECTIVE:   DIAGNOSTIC FINDINGS:  None available for LB  PATIENT SURVEYS:  FOTO 22; goal 44  SCREENING FOR RED FLAGS: Bowel or bladder incontinence: No Spinal tumors: No Cauda equina syndrome: No Compression fracture: No Abdominal aneurysm: No  COGNITION: Overall cognitive status: Within functional limits for tasks assessed     SENSATION: WFL  MUSCLE LENGTH: Hamstrings: full knee extension in seated position    POSTURE: rounded shoulders, forward head, decreased lumbar lordosis, increased thoracic kyphosis, and flexed trunk   PALPATION: Muscular tenderness and tightness L lats from posterior shoulder to pelvis with increased sensitivity in the LB area   LUMBAR ROM:   AROM eval  Flexion 80% pulling  Extension 50%   Right lateral flexion 40% pulling pain L LB  Left lateral flexion 45% pulling L LB  Right rotation 25%pulling L LB  Left rotation 25% pulling L B   (Blank rows = not tested)  LOWER EXTREMITY MMT:   functional strength bilat LE for transfers and ambulation for functional distances   Active  Right eval Left eval  Hip flexion    Hip extension    Hip abduction    Hip adduction    Hip internal rotation    Hip external rotation    Knee flexion    Knee extension 5- 4+  Ankle dorsiflexion    Ankle plantarflexion    Ankle inversion    Ankle eversion     (Blank rows = not tested)  LOWER EXTREMITY ROM: unable to fully assess - patient has tightness in hip flexors bilat    FUNCTIONAL TESTS:  5 times sit to stand:   GAIT: Distance walked: 40 Assistive device utilized: Walker - 4 wheeled Level of assistance: Complete Independence Comments: flexed forward trunk   [facility] Adult PT Treatment:                                                DATE: 07/26/23 Therapeutic Exercise: Sitting  Lateral trunk flexion to R 10-20 sec old x 5  Manual Therapy: Will add manual work through the L lats/lumbar spine musculature  Neuromuscular re-ed: Encouraging  upright posture with sitting and standing  Modalities: No estim/US due to history of breast cancer  Self Care: Myofacial ball release work L thoracolumbar musculature in sitting     PATIENT EDUCATION:  Education details: POC; HEP  Person educated: Patient Education method: Programmer, multimedia, Facilities manager, Actor cues, Verbal cues, and Handouts Education comprehension: verbalized understanding, returned demonstration, verbal cues required, tactile cues required, and needs further education  HOME EXERCISE PROGRAM: Access Code: [email or web address] URL: [email or web address] Date: 07/26/2023 Prepared by: Corlis Leak  Exercises - Seated Sidebending  - 3-4 x daily - 7 x weekly - 1 sets - 3 reps - 20-30 sec  hold - Standing Infraspinatus/Teres Minor Release with Ball at Wall  - 2 x daily - 7 x weekly  Patient Education - Trigger Point Dry Needling  ASSESSMENT:  CLINICAL IMPRESSION: Patient is a 77 y.o. female who was seen today for physical therapy evaluation and treatment for lumbar muscle spasms. She reports undergoing L reverse TSA 06/16/23 but has not started therapy for shoulder to date. She sustained injury to L LB 07/13/23 when she wsa assisted to stand to stand up from recliner.  She has been treated with medication with some improvement but has continued pain in the area of the L lats. She presents with poor posture and alignment; limited and painful trunk mobility; generalized weakness; pain on a daily basis. Unable to complete full evaluation due to recent L reverse total shoulder surgery 06/16/23. She continues to have MD restrictions for L UE. Patient will benefit from PT to address problems identified.    OBJECTIVE IMPAIRMENTS: decreased activity tolerance, decreased mobility, decreased ROM, decreased strength, hypomobility, increased fascial restrictions, increased muscle spasms, impaired flexibility, improper body mechanics, postural dysfunction, and pain.   ACTIVITY LIMITATIONS:  carrying, lifting, bending, sitting, standing, and transfers  PARTICIPATION LIMITATIONS: meal prep, cleaning, laundry, driving, shopping, and community activity  PERSONAL FACTORS: Age, Behavior pattern, Fitness, Past/current experiences, Time since onset of injury/illness/exacerbation, and comorbidities including are also affecting patient's functional outcome.   REHAB POTENTIAL: Good  CLINICAL DECISION MAKING: Stable/uncomplicated  EVALUATION COMPLEXITY: Low   GOALS: Goals reviewed with patient? Yes  SHORT TERM GOALS: Target date: 09/06/2023   Independent in initial HEP Baseline: Goal status: INITIAL  2.  Improve awareness of positioning and alignment for sitting  Baseline:  Goal status: INITIAL   LONG TERM GOALS: Target date: 10/18/2023   Patient reports 50-75% decrease in L LBP Baseline:  Goal status: INITIAL  2.  Increased trunk ROM to WFL's (with current limitations due to health related problems)  Baseline:  Goal status: INITIAL  3.  Patient will demonstrate improved postural alignment for functional activities Baseline:  Goal status: INITIAL  4.  Patient demonstrates improve gait tolerance  Baseline:  Goal status: INITIAL  5.  Independent in initial HEP including aquatic program as indicated  Baseline:  Goal status: INITIAL  6.  Improve functional limitation level to 44 Baseline: 22 Goal status: INITIAL  PLAN:  PT FREQUENCY: 2x/week  PT DURATION: 12 weeks  PLANNED INTERVENTIONS: Therapeutic exercises, Therapeutic activity, Neuromuscular re-education, Balance training, Gait training, Patient/Family education, Self Care, Joint mobilization, Aquatic Therapy, Dry Needling, Electrical stimulation, Spinal mobilization, Cryotherapy, Moist heat, Taping, Ultrasound, Ionotophoresis 4mg /ml Dexamethasone, Manual therapy, and Re-evaluation.  PLAN FOR NEXT SESSION: review and progress exercises; spine care education; manual work, DN, modalities as indicated     Raydan Schlabach Rober Minion, PT 08/17/2023, 12:54 PM

## 2023-07-26 ENCOUNTER — Encounter: Payer: Self-pay | Admitting: Rehabilitative and Restorative Service Providers"

## 2023-07-26 ENCOUNTER — Other Ambulatory Visit: Payer: Self-pay

## 2023-07-26 ENCOUNTER — Ambulatory Visit: Payer: Medicare Other | Attending: Family Medicine | Admitting: Rehabilitative and Restorative Service Providers"

## 2023-07-26 DIAGNOSIS — R293 Abnormal posture: Secondary | ICD-10-CM

## 2023-07-26 DIAGNOSIS — M6281 Muscle weakness (generalized): Secondary | ICD-10-CM

## 2023-07-26 DIAGNOSIS — M5459 Other low back pain: Secondary | ICD-10-CM | POA: Diagnosis present

## 2023-07-26 DIAGNOSIS — R29898 Other symptoms and signs involving the musculoskeletal system: Secondary | ICD-10-CM | POA: Diagnosis present

## 2023-07-26 DIAGNOSIS — M62830 Muscle spasm of back: Secondary | ICD-10-CM | POA: Diagnosis present

## 2023-07-31 ENCOUNTER — Ambulatory Visit: Payer: Medicare Other

## 2023-07-31 DIAGNOSIS — M5459 Other low back pain: Secondary | ICD-10-CM

## 2023-07-31 DIAGNOSIS — M62830 Muscle spasm of back: Secondary | ICD-10-CM

## 2023-07-31 DIAGNOSIS — M6281 Muscle weakness (generalized): Secondary | ICD-10-CM

## 2023-07-31 DIAGNOSIS — R293 Abnormal posture: Secondary | ICD-10-CM

## 2023-07-31 DIAGNOSIS — R29898 Other symptoms and signs involving the musculoskeletal system: Secondary | ICD-10-CM

## 2023-07-31 NOTE — Therapy (Signed)
OUTPATIENT PHYSICAL THERAPY THORACOLUMBAR TREATMENT   Patient Name: Brooke Duncan MRN: [id number] DOB:1946-07-24, 77 y.o., female Today's Date: 07/31/2023  END OF SESSION:  PT End of Session - 07/31/23 1537     Visit Number 2    Number of Visits 24    Date for PT Re-Evaluation 10/18/23    Authorization Type medicare + BCBS supplemental    PT Start Time 1538    PT Stop Time 1618    PT Time Calculation (min) 40 min    Activity Tolerance Patient tolerated treatment well    Behavior During Therapy WFL for tasks assessed/performed             Past Medical History:  Diagnosis Date   Acquired solitary kidney    right side 2014   Arthritis    Bronchitis 05/08/2023   Cancer (HCC)    renal cell carcinoma- left kidney removed   Complication of anesthesia    slow to wake, hard to take a deep breath with 1 surgery several yrs ago   Frequency of urination    Headache    Heart murmur    per told by pcp approx. 02/ 2022 heard a very faint murmur, told did need work-up done at this time   History of COVID-19 2021   all symptoms resolved   History of Hashimoto thyroiditis    s/p  total thyroidectomy 1995   History of hypercalcemia    s/p  parathryoidectomy 07/ 2021   History of renal cell carcinoma 2014   s/p  left nephrectomy, pt stated no other treatment   Hypertension    followed by pcp   Hypothyroidism, postsurgical 1995   followed by pcp in [place], moved from [place] 01/ 2021   OSA on CPAP    Thickened endometrium    UTI (urinary tract infection)    finished keflex 08-12-2022   Wears glasses    Past Surgical History:  Procedure Laterality Date   BREAST BIOPSY Left 08/24/2021   BREAST LUMPECTOMY WITH RADIOACTIVE SEED LOCALIZATION Left 07/14/2022   Procedure: LEFT BREAST LUMPECTOMY WITH RADIOACTIVE SEED LOCALIZATION;  Surgeon: Emelia Loron, MD;  Location: [facility];  Service: General;  Laterality: Left;   CYSTOSCOPY  08/04/2022   in dr winter  office   CYSTOSCOPY WITH BIOPSY N/A 09/02/2022   Procedure: CYSTOSCOPY WITH BLADDER BIOPSY;  Surgeon: Rene Paci, MD;  Location: [facility];  Service: Urology;  Laterality: N/A;  ONLY NEEDS 30 MIN   HYSTEROSCOPY WITH D & C N/A 04/08/2021   Procedure: DILATATION AND CURETTAGE /HYSTEROSCOPY, EXCISION OF VAGINAL LESION;  Surgeon: Silverio Lay, MD;  Location: [facility];  Service: Gynecology;  Laterality: N/A;   LAPAROSCOPIC CHOLECYSTECTOMY  2005   NEPHRECTOMY Left 2014   PARATHYROIDECTOMY  07/ 2021  in [place]   REVERSE SHOULDER ARTHROPLASTY Left 06/16/2023   Procedure: REVERSE SHOULDER ARTHROPLASTY;  Surgeon: Beverely Low, MD;  Location: [facility];  Service: Orthopedics;  Laterality: Left;   TONSILLECTOMY  age 49   TOTAL HIP ARTHROPLASTY Bilateral left 2015;  right 2009   TOTAL KNEE ARTHROPLASTY Left 2014   TOTAL THYROIDECTOMY Bilateral 1995   WISDOM TOOTH EXTRACTION Bilateral    Patient Active Problem List   Diagnosis Date Noted   H/O total shoulder replacement, left 06/16/2023   Colonic mass 11/03/2022   Malignant neoplasm of upper-outer quadrant of left breast in female, estrogen receptor positive (HCC) 08/27/2021   Abnormal ultrasound of endometrium 04/08/2021  PCP: Dr Daisy Floro  REFERRING PROVIDER: Dr Shireen Quan  REFERRING DIAG: Muscle spasm of back  Rationale for Evaluation and Treatment: Rehabilitation  THERAPY DIAG:  Other low back pain  Muscle spasm of back  Other symptoms and signs involving the musculoskeletal system  Muscle weakness (generalized)  Abnormal posture  ONSET DATE: 07/13/23  SUBJECTIVE:                                                                                                                                                                                           SUBJECTIVE STATEMENT: Patient reports 7/10 pain on L side low back, states bending forward or towards left side bothers her  most. Patient states she had difficulty maneuvering massage ball due to restrictions with L arm.   Patient reports that she had L total shoulder replacement 06/16/23 but has not started therapy for shoulder. She has been sleeping in her reclined since the L reverse TSA. The power was out 07/13/23 and she was unable to get out tf the recliner. She called the fire department to help her get out of the chair. The firefighter helping on the L pushed harder on the L and created increased pain in the L LB area. She was seen after 6 days and treated with antiinflammatory and muscle relaxants with some improvement but continues to have pain in the L LB area. She is no longer sleeping in the recliner but is now in the bed with head of the bed raised  PERTINENT HISTORY:  Arthritis; HTN; gall bladder removed; L TKA 2014; L posterior hip pain; L shoulder pain; L reverse TSA;  L breast cancer continues on estrogen inhibitor treatment   PAIN:  Are you having pain? Yes: NPRS scale: 5-6/10; at worst 8/10 Pain location: L LB  Pain description: aching Aggravating factors: movement; walking; bending  Relieving factors: meds; heat   PRECAUTIONS: L reverse TSA 06/16/23 limited with shoulder flexion; abduction; extension - can lift cup of glass   RED FLAGS: None   WEIGHT BEARING RESTRICTIONS: No  FALLS:  Has patient fallen in last 6 months? No  LIVING ENVIRONMENT: Lives with: lives with their family and lives alone Lives in: House/apartment Stairs: No Has following equipment at home: Dan Humphreys - 4 wheeled, shower chair, bed side commode, Grab bars, and Ramped entry  OCCUPATION: retired from office work ~ 2007; has help for household chores; Animator, sewing, quilting, book club, family   PLOF: Independent with basic ADLs  PATIENT GOALS: reduce or eliminate back discomfort   NEXT MD VISIT: no return appt with Dr Ladona Horns; Dr Ranell Patrick 08/02/23 for shoulder  OBJECTIVE:   DIAGNOSTIC FINDINGS:  None available for  LB  PATIENT SURVEYS:  FOTO 22; goal 94  SCREENING FOR RED FLAGS: Bowel or bladder incontinence: No Spinal tumors: No Cauda equina syndrome: No Compression fracture: No Abdominal aneurysm: No  COGNITION: Overall cognitive status: Within functional limits for tasks assessed     SENSATION: WFL  MUSCLE LENGTH: Hamstrings: full knee extension in seated position    POSTURE: rounded shoulders, forward head, decreased lumbar lordosis, increased thoracic kyphosis, and flexed trunk   PALPATION: Muscular tenderness and tightness L lats from posterior shoulder to pelvis with increased sensitivity in the LB area   LUMBAR ROM:   AROM eval  Flexion 80% pulling  Extension 50%   Right lateral flexion 40% pulling pain L LB  Left lateral flexion 45% pulling L LB  Right rotation 25%pulling L LB  Left rotation 25% pulling L B   (Blank rows = not tested)  LOWER EXTREMITY MMT:   functional strength bilat LE for transfers and ambulation for functional distances   Active  Right eval Left eval  Hip flexion    Hip extension    Hip abduction    Hip adduction    Hip internal rotation    Hip external rotation    Knee flexion    Knee extension 5- 4+  Ankle dorsiflexion    Ankle plantarflexion    Ankle inversion    Ankle eversion     (Blank rows = not tested)  LOWER EXTREMITY ROM: unable to fully assess - patient has tightness in hip flexors bilat    FUNCTIONAL TESTS:  5 times sit to stand:   GAIT: Distance walked: 40 Assistive device utilized: Environmental consultant - 4 wheeled Level of assistance: Complete Independence Comments: flexed forward trunk    [facility] Adult PT Treatment:                                                DATE: 07/31/2023 Therapeutic Exercise: Seated on airex: Trunk flexion 8x10" Side trunk flexion to R 8x10" Red PB roll out on slight R diagonal 5x10" Ribcage breathing with YTB 4-part core activation  Marching Chest lift with pool noodle at back --> vertical &  horizontal noodle placement    [facility] Adult PT Treatment:                                                DATE: 07/26/23 Therapeutic Exercise: Sitting  Lateral trunk flexion to R 10-20 sec old x 5  Manual Therapy: Will add manual work through the L lats/lumbar spine musculature  Neuromuscular re-ed: Encouraging upright posture with sitting and standing  Modalities: No estim/US due to history of breast cancer  Self Care: Myofacial ball release work L thoracolumbar musculature in sitting     PATIENT EDUCATION:  Education details: POC; HEP  Person educated: Patient Education method: Programmer, multimedia, Facilities manager, Actor cues, Verbal cues, and Handouts Education comprehension: verbalized understanding, returned demonstration, verbal cues required, tactile cues required, and needs further education  HOME EXERCISE PROGRAM: Access Code: [email or web address] URL: [email or web address] Date: 07/31/2023 Prepared by: Carlynn Herald  Exercises - Seated Sidebending  - 3-4 x daily - 7 x weekly - 1 sets - 3 reps - 20-30 sec  hold - Standing Infraspinatus/Teres Minor Release with Ball at [employer]  - 2 x daily - 7 x weekly - Seated Forward Bending with PLB  - 1 x daily - 7 x weekly - 3 sets - 10 reps - Seated Single Leg March with Weight Shift  - 1 x daily - 7 x weekly - 3 sets - 10 reps - Seated Diaphragmatic Breathing  - 1 x daily - 7 x weekly - 3 sets - 10 reps - Seated Shoulder Flexion Towel Slide at Table Top  - 1 x daily - 7 x weekly - 3 sets - 10 reps - 5 sec hold - Seated Thoracic Extension AROM  - 1 x daily - 7 x weekly - 3 sets - 10 reps - 5-10 sec hold  Patient Education - Trigger Point Dry Needling  ASSESSMENT:  CLINICAL IMPRESSION: Session focused on progressing thoracolumbar mobility. Resistance band utilized during seated breathing to improve postural and proprioceptive awareness of lateral ribcage movement. Core stability challenged with seated marching; patient able to complete  all exercises with no exacerbation of symptoms.   EVAL: Patient is a 77 y.o. female who was seen today for physical therapy evaluation and treatment for lumbar muscle spasms. She reports undergoing L reverse TSA 06/16/23 but has not started therapy for shoulder to date. She sustained injury to L LB 07/13/23 when she wsa assisted to stand to stand up from recliner. She has been treated with medication with some improvement but has continued pain in the area of the L lats. She presents with poor posture and alignment; limited and painful trunk mobility; generalized weakness; pain on a daily basis. Unable to complete full evaluation due to recent L reverse total shoulder surgery 06/16/23. She continues to have MD restrictions for L UE. Patient will benefit from PT to address problems identified.    OBJECTIVE IMPAIRMENTS: decreased activity tolerance, decreased mobility, decreased ROM, decreased strength, hypomobility, increased fascial restrictions, increased muscle spasms, impaired flexibility, improper body mechanics, postural dysfunction, and pain.   ACTIVITY LIMITATIONS: carrying, lifting, bending, sitting, standing, and transfers  PARTICIPATION LIMITATIONS: meal prep, cleaning, laundry, driving, shopping, and community activity  PERSONAL FACTORS: Age, Behavior pattern, Fitness, Past/current experiences, Time since onset of injury/illness/exacerbation, and comorbidities including are also affecting patient's functional outcome.   REHAB POTENTIAL: Good  CLINICAL DECISION MAKING: Stable/uncomplicated  EVALUATION COMPLEXITY: Low   GOALS: Goals reviewed with patient? Yes  SHORT TERM GOALS: Target date: 09/06/2023  Independent in initial HEP Baseline: Goal status: INITIAL  2.  Improve awareness of positioning and alignment for sitting  Baseline:  Goal status: INITIAL   LONG TERM GOALS: Target date: 10/18/2023  Patient reports 50-75% decrease in L LBP Baseline:  Goal status: INITIAL  2.   Increased trunk ROM to WFL's (with current limitations due to health related problems)  Baseline:  Goal status: INITIAL  3.  Patient will demonstrate improved postural alignment for functional activities Baseline:  Goal status: INITIAL  4.  Patient demonstrates improve gait tolerance  Baseline:  Goal status: INITIAL  5.  Independent in initial HEP including aquatic program as indicated  Baseline:  Goal status: INITIAL  6.  Improve functional limitation level to 44 Baseline: 22 Goal status: INITIAL  PLAN:  PT FREQUENCY: 2x/week  PT DURATION: 12 weeks  PLANNED INTERVENTIONS: Therapeutic exercises, Therapeutic activity, Neuromuscular re-education, Balance training, Gait training, Patient/Family education, Self Care, Joint mobilization, Aquatic Therapy, Dry Needling, Electrical stimulation, Spinal mobilization, Cryotherapy, Moist heat, Taping, Ultrasound, Ionotophoresis 4mg /ml Dexamethasone, Manual  therapy, and Re-evaluation.  PLAN FOR NEXT SESSION: review and progress exercises; spine care education; manual work, DN, modalities as indicated    Sanjuana Mae, PTA 07/31/2023, 4:33 PM

## 2023-08-02 ENCOUNTER — Ambulatory Visit: Payer: Medicare Other | Admitting: Rehabilitative and Restorative Service Providers"

## 2023-08-02 ENCOUNTER — Other Ambulatory Visit: Payer: Self-pay

## 2023-08-02 ENCOUNTER — Ambulatory Visit: Payer: Medicare Other | Attending: Orthopedic Surgery | Admitting: Rehabilitative and Restorative Service Providers"

## 2023-08-02 ENCOUNTER — Encounter: Payer: Self-pay | Admitting: Rehabilitative and Restorative Service Providers"

## 2023-08-02 DIAGNOSIS — M25612 Stiffness of left shoulder, not elsewhere classified: Secondary | ICD-10-CM | POA: Diagnosis present

## 2023-08-02 DIAGNOSIS — M6281 Muscle weakness (generalized): Secondary | ICD-10-CM | POA: Diagnosis present

## 2023-08-02 DIAGNOSIS — R29898 Other symptoms and signs involving the musculoskeletal system: Secondary | ICD-10-CM | POA: Diagnosis present

## 2023-08-02 DIAGNOSIS — M5459 Other low back pain: Secondary | ICD-10-CM

## 2023-08-02 DIAGNOSIS — M25512 Pain in left shoulder: Secondary | ICD-10-CM | POA: Diagnosis present

## 2023-08-02 DIAGNOSIS — R293 Abnormal posture: Secondary | ICD-10-CM

## 2023-08-02 DIAGNOSIS — M62830 Muscle spasm of back: Secondary | ICD-10-CM | POA: Insufficient documentation

## 2023-08-02 DIAGNOSIS — G8929 Other chronic pain: Secondary | ICD-10-CM | POA: Diagnosis present

## 2023-08-02 NOTE — Therapy (Signed)
OUTPATIENT PHYSICAL THERAPY SHOULDER EVALUATION   Patient Name: Brooke Duncan MRN: [id number] DOB:19-Jan-1946, 77 y.o., female Today's Date: 08/02/2023  END OF SESSION:  PT End of Session - 08/02/23 1606     Visit Number 1    Number of Visits 24    Date for PT Re-Evaluation 10/25/23    Authorization Type medicare + BCBS supplemental    PT Start Time 1600    PT Stop Time 1645    PT Time Calculation (min) 45 min    Activity Tolerance Patient tolerated treatment well             Past Medical History:  Diagnosis Date   Acquired solitary kidney    right side 2014   Arthritis    Bronchitis 05/08/2023   Cancer (HCC)    renal cell carcinoma- left kidney removed   Complication of anesthesia    slow to wake, hard to take a deep breath with 1 surgery several yrs ago   Frequency of urination    Headache    Heart murmur    per told by pcp approx. 02/ 2022 heard a very faint murmur, told did need work-up done at this time   History of COVID-19 2021   all symptoms resolved   History of Hashimoto thyroiditis    s/p  total thyroidectomy 1995   History of hypercalcemia    s/p  parathryoidectomy 07/ 2021   History of renal cell carcinoma 2014   s/p  left nephrectomy, pt stated no other treatment   Hypertension    followed by pcp   Hypothyroidism, postsurgical 1995   followed by pcp in [place], moved from [place] 01/ 2021   OSA on CPAP    Thickened endometrium    UTI (urinary tract infection)    finished keflex 08-12-2022   Wears glasses    Past Surgical History:  Procedure Laterality Date   BREAST BIOPSY Left 08/24/2021   BREAST LUMPECTOMY WITH RADIOACTIVE SEED LOCALIZATION Left 07/14/2022   Procedure: LEFT BREAST LUMPECTOMY WITH RADIOACTIVE SEED LOCALIZATION;  Surgeon: Emelia Loron, MD;  Location: [facility];  Service: General;  Laterality: Left;   CYSTOSCOPY  08/04/2022   in dr winter office   CYSTOSCOPY WITH BIOPSY N/A 09/02/2022   Procedure:  CYSTOSCOPY WITH BLADDER BIOPSY;  Surgeon: Rene Paci, MD;  Location: [facility];  Service: Urology;  Laterality: N/A;  ONLY NEEDS 30 MIN   HYSTEROSCOPY WITH D & C N/A 04/08/2021   Procedure: DILATATION AND CURETTAGE /HYSTEROSCOPY, EXCISION OF VAGINAL LESION;  Surgeon: Silverio Lay, MD;  Location: [facility];  Service: Gynecology;  Laterality: N/A;   LAPAROSCOPIC CHOLECYSTECTOMY  2005   NEPHRECTOMY Left 2014   PARATHYROIDECTOMY  07/ 2021  in [place]   REVERSE SHOULDER ARTHROPLASTY Left 06/16/2023   Procedure: REVERSE SHOULDER ARTHROPLASTY;  Surgeon: Beverely Low, MD;  Location: [facility];  Service: Orthopedics;  Laterality: Left;   TONSILLECTOMY  age 4   TOTAL HIP ARTHROPLASTY Bilateral left 2015;  right 2009   TOTAL KNEE ARTHROPLASTY Left 2014   TOTAL THYROIDECTOMY Bilateral 1995   WISDOM TOOTH EXTRACTION Bilateral    Patient Active Problem List   Diagnosis Date Noted   H/O total shoulder replacement, left 06/16/2023   Colonic mass 11/03/2022   Malignant neoplasm of upper-outer quadrant of left breast in female, estrogen receptor positive (HCC) 08/27/2021   Abnormal ultrasound of endometrium 04/08/2021    PCP: Dr Daisy Floro   REFERRING PROVIDER:  Dr Malon Kindle   REFERRING DIAG: L reverse TSA  THERAPY DIAG:  Other low back pain  Muscle spasm of back  Other symptoms and signs involving the musculoskeletal system  Muscle weakness (generalized)  Abnormal posture  Chronic left shoulder pain  Stiffness of left shoulder, not elsewhere classified  Rationale for Evaluation and Treatment: Rehabilitation  ONSET DATE: surgery 06/16/23  SUBJECTIVE:                                                                                                                                                                                      SUBJECTIVE STATEMENT: Patient reports that she has had L shoulder pain for ~ 5 years with gradual  increase in symptoms including pain and weakness L UE. She has difficulty with functional activities involving L UE. She could not even comb her hair with the L UE. She has difficulty sleeping due to aching L UE. Underwent L reverse TSA 06/16/23 with no complications. She is having some discomfort and stiffness in the L shoulder. She has limited ROM.  Hand dominance: Right  PERTINENT HISTORY: Arthritis; HTN; gall bladder removed; L TKA 2014; L posterior hip pain; L shoulder pain; L reverse TSA;  L breast cancer continues on estrogen inhibitor treatment   PAIN:  Are you having pain? Yes: NPRS scale: 0/10 Pain location: L shoulder  Pain description: stiffness and discomfort  Aggravating factors: movement Relieving factors: rest and meds  PRECAUTIONS: Other: reverse TSA protocol   RED FLAGS: None   WEIGHT BEARING RESTRICTIONS: Yes no weight bearing L UE   FALLS:  Has patient fallen in last 6 months? No  LIVING ENVIRONMENT: Lives with: lives with their family and lives alone Lives in: House/apartment Stairs: No  OCCUPATION: Retired Cytogeneticist work; active with household chores; sewing quilting; reading   PLOF: Independent  PATIENT GOALS:to use L arm for functional activities again   NEXT MD VISIT: 09/13/23  OBJECTIVE:   DIAGNOSTIC FINDINGS:  CT scan 2023: IMPRESSION: 1. Severe left glenohumeral joint degenerative changes as detailed above. 2. Moderate AC joint degenerative changes. 3. Grossly by CT the rotator cuff tendons are intact.  PATIENT SURVEYS:  FOTO   COGNITION: Overall cognitive status: Within functional limits for tasks assessed     SENSATION: WFL  POSTURE: Patient presents with head forward posture with increased thoracic kyphosis; shoulders rounded and elevated; scapulae abducted and rotated along the thoracic spine; head of the humerus anterior in orientation.   UPPER EXTREMITY ROM:   Active ROM Right eval Left eval  Shoulder flexion 114 65   Shoulder extension 49 NT  Shoulder abduction 84 67  Shoulder adduction  Shoulder internal rotation Hand to lateral hip NT  Shoulder external rotation 40 elbow at side  19 elbow at side   Elbow flexion    Elbow extension    Wrist flexion    Wrist extension    Wrist ulnar deviation    Wrist radial deviation    Wrist pronation    Wrist supination    (Blank rows = not tested)  UPPER EXTREMITY MMT: not tested   MMT Right eval Left eval  Shoulder flexion    Shoulder extension    Shoulder abduction    Shoulder adduction    Shoulder internal rotation    Shoulder external rotation    Middle trapezius    Lower trapezius    Elbow flexion    Elbow extension    Wrist flexion    Wrist extension    Wrist ulnar deviation    Wrist radial deviation    Wrist pronation    Wrist supination    Grip strength (lbs)    (Blank rows = not tested)  JOINT MOBILITY TESTING:  N/A  PALPATION:  Tightness L shoulder girdle    [facility] Adult PT Treatment:                                                DATE: 08/02/23 Therapeutic Exercise: Sitting Scap squeeze with noodle 10 sec x 10  Table slide 10 sec x 5 ER with cane 10 sec x 5 (protocol limit no > than 30 deg)     PATIENT EDUCATION: Education details: POC; HEP  Person educated: Patient Education method: Programmer, multimedia, Demonstration, Tactile cues, Verbal cues, and Handouts Education comprehension: verbalized understanding, returned demonstration, verbal cues required, tactile cues required, and needs further education  HOME EXERCISE PROGRAM: Access Code: [email or web address] URL: [email or web address] Date: 08/02/2023 Prepared by: Corlis Leak  Exercises - Seated Scapular Retraction  - 2 x daily - 7 x weekly - 1-2 sets - 10 reps - 10 sec  hold - Seated Bilateral Shoulder Flexion Towel Slide at Table Top  - 2 x daily - 7 x weekly - 1 sets - 5-10 reps - 10 sec  hold - Seated Shoulder External Rotation AAROM with Cane and Hand in  Neutral  - 2 x daily - 7 x weekly - 1 sets - 5-10 reps - 5-10 sec  hold  ASSESSMENT:  CLINICAL IMPRESSION: Patient is a 77 y.o. female who was seen today for physical therapy evaluation and treatment for shoulder rehab s/p L reverse TSA 06/16/23. She presents with poor posture and alignment; limited L shoulder ROM, strength, mobility, function; limited ADL's; pain on an intermittent basis. She will benefit from PT to address problems identified.   OBJECTIVE IMPAIRMENTS: decreased activity tolerance, decreased mobility, decreased ROM, decreased strength, hypomobility, increased fascial restrictions, increased muscle spasms, impaired flexibility, impaired UE functional use, improper body mechanics, postural dysfunction, obesity, and pain.   ACTIVITY LIMITATIONS: carrying, lifting, bending, standing, squatting, sleeping, bed mobility, bathing, toileting, dressing, reach over head, and hygiene/grooming  PARTICIPATION LIMITATIONS: meal prep, cleaning, laundry, driving, and community activity  PERSONAL FACTORS: Age, Behavior pattern, Fitness, Past/current experiences, and Time since onset of injury/illness/exacerbation are also affecting patient's functional outcome.   REHAB POTENTIAL: Good  CLINICAL DECISION MAKING: Stable/uncomplicated  EVALUATION COMPLEXITY: Low   GOALS: Goals reviewed with patient? Yes  SHORT TERM GOALS: Target date: 09/13/2023  Independent in initial HEP  Baseline: Goal status: INITIAL  2.  Patient to demonstrate improved scapular control with posterior shoulder girdle engaged for exercises  Baseline:  Goal status: INITIAL   LONG TERM GOALS: Target date: 10/25/2023   Improve posture and alignment with patient to demonstrate improved posterior shoulder activation and control for functional movements  Baseline:  Goal status: INITIAL  2.  Increase active AROM L shoulder to ~ equal to AROM R shoulder for flexion; scaption; ER  Baseline:  Goal status:  INITIAL  3.  4/5 strength L shoulder  Baseline:  Goal status: INITIAL  4.  Patient reports ability to use L UE for functional activities required for independent living  Baseline:  Goal status: INITIAL  5.  Independent in HEP including aquatic program as indicated  Baseline:  Goal status: INITIAL   PLAN:  PT FREQUENCY: 1-2 times/week  PT DURATION: 12 weeks  PLANNED INTERVENTIONS: Therapeutic exercises, Therapeutic activity, Neuromuscular re-education, Patient/Family education, Self Care, Joint mobilization, Aquatic Therapy, Dry Needling, Electrical stimulation, Spinal mobilization, Cryotherapy, Moist heat, Ultrasound, Ionotophoresis 4mg /ml Dexamethasone, Manual therapy, and Re-evaluation  PLAN FOR NEXT SESSION: review and progress with exercise program per protocol; manual work, DN, modalities as indicated    [employer], PT 08/02/2023, 4:08 PM

## 2023-08-02 NOTE — Therapy (Signed)
OUTPATIENT PHYSICAL THERAPY THORACOLUMBAR TREATMENT   Patient Name: Brooke Duncan MRN: [id number] DOB:09/10/1946, 77 y.o., female Today's Date: 08/02/2023  END OF SESSION:  PT End of Session - 08/02/23 1531     Visit Number 3    Number of Visits 24    Date for PT Re-Evaluation 10/18/23    Authorization Type medicare + [employer]BS supplemental    PT Start Time 1530    PT Stop Time 1616    PT Time Calculation (min) 46 min    Activity Tolerance Patient tolerated treatment well             Past Medical History:  Diagnosis Date   Acquired solitary kidney    right side 2014   Arthritis    Bronchitis 05/08/2023   Cancer (HCC)    renal cell carcinoma- left kidney removed   Complication of anesthesia    slow to wake, hard to take a deep breath with 1 surgery several yrs ago   Frequency of urination    Headache    Heart murmur    per told by pcp approx. 02/ 2022 heard a very faint murmur, told did need work-up done at this time   History of COVID-19 2021   all symptoms resolved   History of Hashimoto thyroiditis    s/p  total thyroidectomy 1995   History of hypercalcemia    s/p  parathryoidectomy 07/ 2021   History of renal cell carcinoma 2014   s/p  left nephrectomy, pt stated no other treatment   Hypertension    followed by pcp   Hypothyroidism, postsurgical 1995   followed by pcp in [place], moved from [place] 01/ 2021   OSA on CPAP    Thickened endometrium    UTI (urinary tract infection)    finished keflex 08-12-2022   Wears glasses    Past Surgical History:  Procedure Laterality Date   BREAST BIOPSY Left 08/24/2021   BREAST LUMPECTOMY WITH RADIOACTIVE SEED LOCALIZATION Left 07/14/2022   Procedure: LEFT BREAST LUMPECTOMY WITH RADIOACTIVE SEED LOCALIZATION;  Surgeon: Emelia Loron, MD;  Location: [facility];  Service: General;  Laterality: Left;   CYSTOSCOPY  08/04/2022   in dr winter office   CYSTOSCOPY WITH BIOPSY N/A 09/02/2022   Procedure:  CYSTOSCOPY WITH BLADDER BIOPSY;  Surgeon: Rene Paci, MD;  Location[facility];  Service: Urology;  Laterality: N/A;  ONLY NEEDS 30 MIN   HYSTEROSCOPY WITH D & C N/A 04/08/2021   Procedure: DILATATION AND CURETTAGE /HYSTEROSCOPY, EXCISION OF VAGINAL LESION;  Surgeon: Silverio Lay, MD;  Location: [facility];  Service: Gynecology;  Laterality: N/A;   LAPAROSCOPIC CHOLECYSTECTOMY  2005   NEPHRECTOMY Left 2014   PARATHYROIDECTOMY  07/ 2021  in [place]   REVERSE SHOULDER ARTHROPLASTY Left 06/16/2023   Procedure: REVERSE SHOULDER ARTHROPLASTY;  Surgeon: Beverely Low, MD;  Location: [facility];  Service: Orthopedics;  Laterality: Left;   TONSILLECTOMY  age 69   TOTAL HIP ARTHROPLASTY Bilateral left 2015;  right 2009   TOTAL KNEE ARTHROPLASTY Left 2014   TOTAL THYROIDECTOMY Bilateral 1995   WISDOM TOOTH EXTRACTION Bilateral    Patient Active Problem List   Diagnosis Date Noted   H/O total shoulder replacement, left 06/16/2023   Colonic mass 11/03/2022   Malignant neoplasm of upper-outer quadrant of left breast in female, estrogen receptor positive (HCC) 08/27/2021   Abnormal ultrasound of endometrium 04/08/2021    PCP: Dr Daisy Floro  REFERRING PROVIDER: Dr  Shireen Quan  REFERRING DIAG: Muscle spasm of back  Rationale for Evaluation and Treatment: Rehabilitation  THERAPY DIAG:  Other low back pain  Muscle spasm of back  Other symptoms and signs involving the musculoskeletal system  Muscle weakness (generalized)  Abnormal posture  ONSET DATE: 07/13/23  SUBJECTIVE:                                                                                                                                                                                           SUBJECTIVE STATEMENT: Patient reports 7/10 pain on L side low back earlier today. She was driving more today; going to her appointment for shoulder surgeon. Some improvement with OTC  meds and rest. Has the OK to begin shoulder rehab today.  Evaluation: Patient reports that she had L total shoulder replacement 06/16/23 but has not started therapy for shoulder. She has been sleeping in her reclined since the L reverse TSA. The power was out 07/13/23 and she was unable to get out tf the recliner. She called the fire department to help her get out of the chair. The firefighter helping on the L pushed harder on the L and created increased pain in the L LB area. She was seen after 6 days and treated with antiinflammatory and muscle relaxants with some improvement but continues to have pain in the L LB area. She is no longer sleeping in the recliner but is now in the bed with head of the bed raised  PERTINENT HISTORY:  Arthritis; HTN; gall bladder removed; L TKA 2014; L posterior hip pain; L shoulder pain; L reverse TSA;  L breast cancer continues on estrogen inhibitor treatment   PAIN:  Are you having pain? Yes: NPRS scale: 5-6/10; at worst 7/10 Pain location: L LB  Pain description: aching Aggravating factors: movement; walking; bending  Relieving factors: meds; heat   PRECAUTIONS: L reverse TSA 06/16/23 limited with shoulder flexion; abduction; extension - can lift cup of glass   WEIGHT BEARING RESTRICTIONS: No  FALLS:  Has patient fallen in last 6 months? No  LIVING ENVIRONMENT: Lives with: lives with their family and lives alone Lives in: House/apartment Stairs: No Has following equipment at home: Dan Humphreys - 4 wheeled, shower chair, bed side commode, Grab bars, and Ramped entry  OCCUPATION: retired from office work ~ 2007; has help for household chores; Animator, sewing, quilting, book club, family    PATIENT GOALS: reduce or eliminate back discomfort   NEXT MD VISIT: no return appt with Dr Ladona Horns; Dr Ranell Patrick 08/02/23 for shoulder   OBJECTIVE:   DIAGNOSTIC FINDINGS:  None available for LB  PATIENT SURVEYS:  FOTO 22; goal 44  MUSCLE LENGTH: Hamstrings: full knee  extension in seated position    POSTURE: rounded shoulders, forward head, decreased lumbar lordosis, increased thoracic kyphosis, and flexed trunk   PALPATION: Muscular tenderness and tightness L lats from posterior shoulder to pelvis with increased sensitivity in the LB area   LUMBAR ROM:   AROM eval  Flexion 80% pulling  Extension 50%   Right lateral flexion 40% pulling pain L LB  Left lateral flexion 45% pulling L LB  Right rotation 25%pulling L LB  Left rotation 25% pulling L B   (Blank rows = not tested)  LOWER EXTREMITY MMT:   functional strength bilat LE for transfers and ambulation for functional distances   Active  Right eval Left eval  Hip flexion    Hip extension    Hip abduction    Hip adduction    Hip internal rotation    Hip external rotation    Knee flexion    Knee extension 5- 4+  Ankle dorsiflexion    Ankle plantarflexion    Ankle inversion    Ankle eversion     (Blank rows = not tested)  LOWER EXTREMITY ROM: unable to fully assess - patient has tightness in hip flexors bilat    FUNCTIONAL TESTS:  5 times sit to stand:   GAIT: Distance walked: 40 Assistive device utilized: Environmental consultant - 4 wheeled Level of assistance: Complete Independence Comments: flexed forward trunk    [facility] Adult PT Treatment:                                                DATE: 07/31/2023 Therapeutic Exercise: Seated on airex: Trunk flexion 8x10" Side trunk flexion to R 8x10" Red PB roll out on slight R diagonal 5x10" Ribcage breathing with YTB 4-part core activation  Chest lift with pool noodle at back vertically    [facility] Adult PT Treatment:                                                DATE: 07/26/23 Therapeutic Exercise: Sitting  Lateral trunk flexion to R 10-20 sec old x 5  Manual Therapy: Will add manual work through the L lats/lumbar spine musculature  Neuromuscular re-ed: Encouraging upright posture with sitting and standing  Modalities: No estim/US due to  history of breast cancer  Self Care: Myofacial ball release work L thoracolumbar musculature in sitting     PATIENT EDUCATION:  Education details: POC; HEP  Person educated: Patient Education method: Programmer, multimedia, Facilities manager, Actor cues, Verbal cues, and Handouts Education comprehension: verbalized understanding, returned demonstration, verbal cues required, tactile cues required, and needs further education  HOME EXERCISE PROGRAM: Access Code: [email or web address] URL: [email or web address] Date: 07/31/2023 Prepared by: Carlynn Herald  Exercises - Seated Sidebending  - 3-4 x daily - 7 x weekly - 1 sets - 3 reps - 20-30 sec  hold - Standing Infraspinatus/Teres Minor Release with Ball at [employer]  - 2 x daily - 7 x weekly - Seated Forward Bending with PLB  - 1 x daily - 7 x weekly - 3 sets - 10 reps - Seated Single Leg March with Weight Shift  - 1 x daily - 7 x weekly - 3 sets -  10 reps - Seated Diaphragmatic Breathing  - 1 x daily - 7 x weekly - 3 sets - 10 reps - Seated Shoulder Flexion Towel Slide at Table Top  - 1 x daily - 7 x weekly - 3 sets - 10 reps - 5 sec hold - Seated Thoracic Extension AROM  - 1 x daily - 7 x weekly - 3 sets - 10 reps - 5-10 sec hold  Patient Education - Trigger Point Dry Needling  ASSESSMENT:  CLINICAL IMPRESSION: Pain in the L LB was somewhat better yesterday but increased today - likely die to visit to MD for shoulder. Continued treatment for L LB pain with focus on gentle ROM/stretching for thoracolumbar mobility.   EVAL: Patient is a 77 y.o. female who was seen today for physical therapy evaluation and treatment for lumbar muscle spasms. She reports undergoing L reverse TSA 06/16/23 but has not started therapy for shoulder to date. She sustained injury to L LB 07/13/23 when she wsa assisted to stand to stand up from recliner. She has been treated with medication with some improvement but has continued pain in the area of the L lats. She presents with  poor posture and alignment; limited and painful trunk mobility; generalized weakness; pain on a daily basis. Unable to complete full evaluation due to recent L reverse total shoulder surgery 06/16/23. She continues to have MD restrictions for L UE. Patient will benefit from PT to address problems identified.    OBJECTIVE IMPAIRMENTS: decreased activity tolerance, decreased mobility, decreased ROM, decreased strength, hypomobility, increased fascial restrictions, increased muscle spasms, impaired flexibility, improper body mechanics, postural dysfunction, and pain.    GOALS: Goals reviewed with patient? Yes  SHORT TERM GOALS: Target date: 09/06/2023  Independent in initial HEP Baseline: Goal status: INITIAL  2.  Improve awareness of positioning and alignment for sitting  Baseline:  Goal status: INITIAL   LONG TERM GOALS: Target date: 10/18/2023  Patient reports 50-75% decrease in L LBP Baseline:  Goal status: INITIAL  2.  Increased trunk ROM to WFL's (with current limitations due to health related problems)  Baseline:  Goal status: INITIAL  3.  Patient will demonstrate improved postural alignment for functional activities Baseline:  Goal status: INITIAL  4.  Patient demonstrates improve gait tolerance  Baseline:  Goal status: INITIAL  5.  Independent in initial HEP including aquatic program as indicated  Baseline:  Goal status: INITIAL  6.  Improve functional limitation level to 44 Baseline: 22 Goal status: INITIAL  PLAN:  PT FREQUENCY: 2x/week  PT DURATION: 12 weeks  PLANNED INTERVENTIONS: Therapeutic exercises, Therapeutic activity, Neuromuscular re-education, Balance training, Gait training, Patient/Family education, Self Care, Joint mobilization, Aquatic Therapy, Dry Needling, Electrical stimulation, Spinal mobilization, Cryotherapy, Moist heat, Taping, Ultrasound, Ionotophoresis 4mg /ml Dexamethasone, Manual therapy, and Re-evaluation.  PLAN FOR NEXT SESSION:  review and progress exercises; spine care education; manual work, DN, modalities as indicated    Lyla Jasek Rober Minion, PT 08/02/2023, 3:32 PM

## 2023-08-08 ENCOUNTER — Ambulatory Visit: Payer: Medicare Other | Admitting: Rehabilitative and Restorative Service Providers"

## 2023-08-10 ENCOUNTER — Ambulatory Visit: Payer: Medicare Other | Attending: Family Medicine | Admitting: Rehabilitative and Restorative Service Providers"

## 2023-08-10 ENCOUNTER — Encounter: Payer: Self-pay | Admitting: Rehabilitative and Restorative Service Providers"

## 2023-08-10 DIAGNOSIS — M6281 Muscle weakness (generalized): Secondary | ICD-10-CM | POA: Diagnosis present

## 2023-08-10 DIAGNOSIS — G8929 Other chronic pain: Secondary | ICD-10-CM | POA: Insufficient documentation

## 2023-08-10 DIAGNOSIS — R293 Abnormal posture: Secondary | ICD-10-CM | POA: Diagnosis present

## 2023-08-10 DIAGNOSIS — M5459 Other low back pain: Secondary | ICD-10-CM | POA: Insufficient documentation

## 2023-08-10 DIAGNOSIS — R29898 Other symptoms and signs involving the musculoskeletal system: Secondary | ICD-10-CM | POA: Insufficient documentation

## 2023-08-10 DIAGNOSIS — M25612 Stiffness of left shoulder, not elsewhere classified: Secondary | ICD-10-CM | POA: Diagnosis present

## 2023-08-10 DIAGNOSIS — M62830 Muscle spasm of back: Secondary | ICD-10-CM | POA: Diagnosis present

## 2023-08-10 DIAGNOSIS — M25512 Pain in left shoulder: Secondary | ICD-10-CM | POA: Insufficient documentation

## 2023-08-10 NOTE — Therapy (Signed)
OUTPATIENT PHYSICAL THERAPY SHOULDER EVALUATION   Patient Name: Brooke Duncan MRN: [id number] DOB:12-Aug-1946, 76 y.o., female Today's Date: 08/10/2023  END OF SESSION:  PT End of Session - 08/10/23 1152     Visit Number 2    Number of Visits 24    Date for PT Re-Evaluation 10/25/23    Authorization Type medicare + BCBS supplemental    PT Start Time 1150    PT Stop Time 1238    PT Time Calculation (min) 48 min    Activity Tolerance Patient tolerated treatment well             Past Medical History:  Diagnosis Date   Acquired solitary kidney    right side 2014   Arthritis    Bronchitis 05/08/2023   Cancer (HCC)    renal cell carcinoma- left kidney removed   Complication of anesthesia    slow to wake, hard to take a deep breath with 1 surgery several yrs ago   Frequency of urination    Headache    Heart murmur    per told by pcp approx. 02/ 2022 heard a very faint murmur, told did need work-up done at this time   History of COVID-19 2021   all symptoms resolved   History of Hashimoto thyroiditis    s/p  total thyroidectomy 1995   History of hypercalcemia    s/p  parathryoidectomy 07/ 2021   History of renal cell carcinoma 2014   s/p  left nephrectomy, pt stated no other treatment   Hypertension    followed by pcp   Hypothyroidism, postsurgical 1995   followed by pcp in [place], moved from [place] 01/ 2021   OSA on CPAP    Thickened endometrium    UTI (urinary tract infection)    finished keflex 08-12-2022   Wears glasses    Past Surgical History:  Procedure Laterality Date   BREAST BIOPSY Left 08/24/2021   BREAST LUMPECTOMY WITH RADIOACTIVE SEED LOCALIZATION Left 07/14/2022   Procedure: LEFT BREAST LUMPECTOMY WITH RADIOACTIVE SEED LOCALIZATION;  Surgeon: Emelia Loron, MD;  Location: [facility];  Service: General;  Laterality: Left;   CYSTOSCOPY  08/04/2022   in dr winter office   CYSTOSCOPY WITH BIOPSY N/A 09/02/2022   Procedure:  CYSTOSCOPY WITH BLADDER BIOPSY;  Surgeon: Rene Paci, MD;  Location: [facility];  Service: Urology;  Laterality: N/A;  ONLY NEEDS 30 MIN   HYSTEROSCOPY WITH D & C N/A 04/08/2021   Procedure: DILATATION AND CURETTAGE /HYSTEROSCOPY, EXCISION OF VAGINAL LESION;  Surgeon: Silverio Lay, MD;  Location: [facility];  Service: Gynecology;  Laterality: N/A;   LAPAROSCOPIC CHOLECYSTECTOMY  2005   NEPHRECTOMY Left 2014   PARATHYROIDECTOMY  07/ 2021  in [place]   REVERSE SHOULDER ARTHROPLASTY Left 06/16/2023   Procedure: REVERSE SHOULDER ARTHROPLASTY;  Surgeon: Beverely Low, MD;  Location: [facility];  Service: Orthopedics;  Laterality: Left;   TONSILLECTOMY  age 6   TOTAL HIP ARTHROPLASTY Bilateral left 2015;  right 2009   TOTAL KNEE ARTHROPLASTY Left 2014   TOTAL THYROIDECTOMY Bilateral 1995   WISDOM TOOTH EXTRACTION Bilateral    Patient Active Problem List   Diagnosis Date Noted   H/O total shoulder replacement, left 06/16/2023   Colonic mass 11/03/2022   Malignant neoplasm of upper-outer quadrant of left breast in female, estrogen receptor positive (HCC) 08/27/2021   Abnormal ultrasound of endometrium 04/08/2021    PCP: Dr Daisy Floro   REFERRING PROVIDER:  Dr Malon Kindle   REFERRING DIAG: L reverse TSA  THERAPY DIAG:  Other low back pain  Muscle spasm of back  Other symptoms and signs involving the musculoskeletal system  Muscle weakness (generalized)  Abnormal posture  Chronic left shoulder pain  Stiffness of left shoulder, not elsewhere classified  Rationale for Evaluation and Treatment: Rehabilitation  ONSET DATE: surgery 06/16/23  SUBJECTIVE:                                                                                                                                                                                      SUBJECTIVE STATEMENT: Working on the shoulder exercises at home. Shoulder is not painful, just  stiff. Has a pulley at home.  EVALUATION:Patient reports that she has had L shoulder pain for ~ 5 years with gradual increase in symptoms including pain and weakness L UE. She has difficulty with functional activities involving L UE. She could not even comb her hair with the L UE. She has difficulty sleeping due to aching L UE. Underwent L reverse TSA 06/16/23 with no complications. She is having some discomfort and stiffness in the L shoulder. She has limited ROM.  Hand dominance: Right  PERTINENT HISTORY: Arthritis; HTN; gall bladder removed; L TKA 2014; L posterior hip pain; L shoulder pain; L reverse TSA;  L breast cancer continues on estrogen inhibitor treatment   PAIN:  Are you having pain? Yes: NPRS scale: 0/10 Pain location: L shoulder  Pain description: stiffness and discomfort  Aggravating factors: movement Relieving factors: rest and meds  PRECAUTIONS: Other: reverse TSA protocol   WEIGHT BEARING RESTRICTIONS: Yes no weight bearing L UE   FALLS:  Has patient fallen in last 6 months? No  LIVING ENVIRONMENT: Lives alone Lives in: House/apartment Stairs: No  OCCUPATION: Retired Cytogeneticist work; active with household chores; sewing quilting; reading   PATIENT GOALS:to use L arm for functional activities again   NEXT MD VISIT: 09/13/23  OBJECTIVE:   DIAGNOSTIC FINDINGS:  CT scan 2023: IMPRESSION: 1. Severe left glenohumeral joint degenerative changes as detailed above. 2. Moderate AC joint degenerative changes. 3. Grossly by CT the rotator cuff tendons are intact.  PATIENT SURVEYS:  FOTO not done for shoulder      SENSATION: WFL  POSTURE: Patient presents with head forward posture with increased thoracic kyphosis; shoulders rounded and elevated; scapulae abducted and rotated along the thoracic spine; head of the humerus anterior in orientation.   UPPER EXTREMITY ROM:   Active ROM Right eval Left eval  Shoulder flexion 114 65  Shoulder extension 49  NT  Shoulder abduction 84 67  Shoulder adduction    Shoulder internal rotation  Hand to lateral hip NT  Shoulder external rotation 40 elbow at side  19 elbow at side   Elbow flexion    Elbow extension    Wrist flexion    Wrist extension    Wrist ulnar deviation    Wrist radial deviation    Wrist pronation    Wrist supination    (Blank rows = not tested)  UPPER EXTREMITY MMT: not tested   MMT Right eval Left eval  Shoulder flexion    Shoulder extension    Shoulder abduction    Shoulder adduction    Shoulder internal rotation    Shoulder external rotation    Middle trapezius    Lower trapezius    Elbow flexion    Elbow extension    Wrist flexion    Wrist extension    Wrist ulnar deviation    Wrist radial deviation    Wrist pronation    Wrist supination    Grip strength (lbs)    (Blank rows = not tested)  JOINT MOBILITY TESTING:  N/A  PALPATION:  Tightness L shoulder girdle    [facility] Adult PT Treatment:                                                DATE: 08/10/23 Therapeutic Exercise: Sitting Scap squeeze with noodle 10 sec x 10  Table slide 10 sec x 8 ER with cane 10 sec x 5 (protocol limit no > than 30 deg) Shoulder extension isometric 5 sec x 5  Shoulder abduction isometric 5 sec x 5  Shoulder ER isometric 5 sec x 5 Shoulder IR isometric 5 sec x 5  Pulley flexion 5 sec x 5 (some discomfort) Pulley shoulder horizontal ab/adduction through range to tolerance x 5 Elbow extension UE in neutral at side 5 sec x 5  Manual:  STM L shoulder girdle   [facility] Adult PT Treatment:                                                DATE: 08/02/23 Therapeutic Exercise: Sitting Scap squeeze with noodle 10 sec x 10  Table slide 10 sec x 5 ER with cane 10 sec x 5 (protocol limit no > than 30 deg)     PATIENT EDUCATION: Education details: POC; HEP  Person educated: Patient Education method: Programmer, multimedia, Demonstration, Tactile cues, Verbal cues, and Handouts Education  comprehension: verbalized understanding, returned demonstration, verbal cues required, tactile cues required, and needs further education  HOME EXERCISE PROGRAM:  Access Code: [email or web address] URL: [email or web address] Date: 08/10/2023 Prepared by: Corlis Leak  Exercises - Seated Scapular Retraction  - 2 x daily - 7 x weekly - 1-2 sets - 10 reps - 10 sec  hold - Seated Bilateral Shoulder Flexion Towel Slide at Table Top  - 2 x daily - 7 x weekly - 1 sets - 5-10 reps - 10 sec  hold - Seated Shoulder External Rotation AAROM with Cane and Hand in Neutral  - 2 x daily - 7 x weekly - 1 sets - 5-10 reps - 5-10 sec  hold - Isometric Shoulder Extension at Wall  - 2 x daily - 7 x weekly - 1 sets - 5-10 reps - 5 sec  hold - Isometric Shoulder Abduction at Wall  - 2 x daily - 7 x weekly - 1 sets - 5-10 reps - 5 sec  hold - Isometric Shoulder External Rotation at Wall  - 2 x daily - 7 x weekly - 1 sets - 5-10 reps - 5 sec  hold - Standing Isometric Shoulder Internal Rotation at Doorway  - 2 x daily - 7 x weekly - 1 sets - 5-10 reps - 5 sec  hold - Seated Shoulder Flexion AAROM with Pulley Behind  - 2 x daily - 7 x weekly - 1 sets - 10 reps - 10 sec  hold - Seated Shoulder Scaption AAROM with Pulley at Side  - 2 x daily - 7 x weekly - 1 sets - 10 reps - 10sec  hold  ASSESSMENT:  CLINICAL IMPRESSION: Patient reports that she is working on the exercises for shoulder. She is not having pain in the L shoulder - just stiffness. Added isometric shoulder exercises and pulley flexion as well as elbow extension arm at side. Manual work through L shoulder girdle - noted areas of tightness in the upper traps; pecs; biceps.  EVAL: Patient is a 77 y.o. female who was seen today for physical therapy evaluation and treatment for shoulder rehab s/p L reverse TSA 06/16/23. She presents with poor posture and alignment; limited L shoulder ROM, strength, mobility, function; limited ADL's; pain on an intermittent basis.  She will benefit from PT to address problems identified.   OBJECTIVE IMPAIRMENTS: decreased activity tolerance, decreased mobility, decreased ROM, decreased strength, hypomobility, increased fascial restrictions, increased muscle spasms, impaired flexibility, impaired UE functional use, improper body mechanics, postural dysfunction, obesity, and pain.    GOALS: Goals reviewed with patient? Yes  SHORT TERM GOALS: Target date: 09/13/2023   Independent in initial HEP  Baseline: Goal status: INITIAL  2.  Patient to demonstrate improved scapular control with posterior shoulder girdle engaged for exercises  Baseline:  Goal status: INITIAL   LONG TERM GOALS: Target date: 10/25/2023   Improve posture and alignment with patient to demonstrate improved posterior shoulder activation and control for functional movements  Baseline:  Goal status: INITIAL  2.  Increase active AROM L shoulder to ~ equal to AROM R shoulder for flexion; scaption; ER  Baseline:  Goal status: INITIAL  3.  4/5 strength L shoulder  Baseline:  Goal status: INITIAL  4.  Patient reports ability to use L UE for functional activities required for independent living  Baseline:  Goal status: INITIAL  5.  Independent in HEP including aquatic program as indicated  Baseline:  Goal status: INITIAL   PLAN:  PT FREQUENCY: 1-2 times/week  PT DURATION: 12 weeks  PLANNED INTERVENTIONS: Therapeutic exercises, Therapeutic activity, Neuromuscular re-education, Patient/Family education, Self Care, Joint mobilization, Aquatic Therapy, Dry Needling, Electrical stimulation, Spinal mobilization, Cryotherapy, Moist heat, Ultrasound, Ionotophoresis 4mg /ml Dexamethasone, Manual therapy, and Re-evaluation  PLAN FOR NEXT SESSION: review and progress with exercise program per protocol; manual work, DN, modalities as indicated    [employer], PT 08/10/2023, 11:54 AM

## 2023-08-14 ENCOUNTER — Ambulatory Visit: Payer: Medicare Other

## 2023-08-14 DIAGNOSIS — G8929 Other chronic pain: Secondary | ICD-10-CM

## 2023-08-14 DIAGNOSIS — M5459 Other low back pain: Secondary | ICD-10-CM | POA: Diagnosis not present

## 2023-08-14 DIAGNOSIS — M6281 Muscle weakness (generalized): Secondary | ICD-10-CM

## 2023-08-14 DIAGNOSIS — M25612 Stiffness of left shoulder, not elsewhere classified: Secondary | ICD-10-CM

## 2023-08-14 DIAGNOSIS — R29898 Other symptoms and signs involving the musculoskeletal system: Secondary | ICD-10-CM

## 2023-08-14 DIAGNOSIS — R293 Abnormal posture: Secondary | ICD-10-CM

## 2023-08-14 NOTE — Therapy (Signed)
OUTPATIENT PHYSICAL THERAPY TREATMENT   Patient Name: Brooke Duncan MRN: [id number] DOB:1946-01-06, 77 y.o., female Today's Date: 08/14/2023  END OF SESSION:  PT End of Session - 08/14/23 1148     Visit Number 3    Number of Visits 24    Date for PT Re-Evaluation 10/25/23    Authorization Type medicare + BCBS supplemental    PT Start Time 1148    PT Stop Time 1230    PT Time Calculation (min) 42 min    Activity Tolerance Patient tolerated treatment well              Past Medical History:  Diagnosis Date   Acquired solitary kidney    right side 2014   Arthritis    Bronchitis 05/08/2023   Cancer (HCC)    renal cell carcinoma- left kidney removed   Complication of anesthesia    slow to wake, hard to take a deep breath with 1 surgery several yrs ago   Frequency of urination    Headache    Heart murmur    per told by pcp approx. 02/ 2022 heard a very faint murmur, told did need work-up done at this time   History of COVID-19 2021   all symptoms resolved   History of Hashimoto thyroiditis    s/p  total thyroidectomy 1995   History of hypercalcemia    s/p  parathryoidectomy 07/ 2021   History of renal cell carcinoma 2014   s/p  left nephrectomy, pt stated no other treatment   Hypertension    followed by pcp   Hypothyroidism, postsurgical 1995   followed by pcp in [place], moved from [place] 01/ 2021   OSA on CPAP    Thickened endometrium    UTI (urinary tract infection)    finished keflex 08-12-2022   Wears glasses    Past Surgical History:  Procedure Laterality Date   BREAST BIOPSY Left 08/24/2021   BREAST LUMPECTOMY WITH RADIOACTIVE SEED LOCALIZATION Left 07/14/2022   Procedure: LEFT BREAST LUMPECTOMY WITH RADIOACTIVE SEED LOCALIZATION;  Surgeon: Emelia Loron, MD;  Location: [facility];  Service: General;  Laterality: Left;   CYSTOSCOPY  08/04/2022   in dr winter office   CYSTOSCOPY WITH BIOPSY N/A 09/02/2022   Procedure: CYSTOSCOPY  WITH BLADDER BIOPSY;  Surgeon: Rene Paci, MD;  Location: [facility];  Service: Urology;  Laterality: N/A;  ONLY NEEDS 30 MIN   HYSTEROSCOPY WITH D & C N/A 04/08/2021   Procedure: DILATATION AND CURETTAGE /HYSTEROSCOPY, EXCISION OF VAGINAL LESION;  Surgeon: Silverio Lay, MD;  Location: [facility];  Service: Gynecology;  Laterality: N/A;   LAPAROSCOPIC CHOLECYSTECTOMY  2005   NEPHRECTOMY Left 2014   PARATHYROIDECTOMY  07/ 2021  in [place]   REVERSE SHOULDER ARTHROPLASTY Left 06/16/2023   Procedure: REVERSE SHOULDER ARTHROPLASTY;  Surgeon: Beverely Low, MD;  Location: [facility];  Service: Orthopedics;  Laterality: Left;   TONSILLECTOMY  age 12   TOTAL HIP ARTHROPLASTY Bilateral left 2015;  right 2009   TOTAL KNEE ARTHROPLASTY Left 2014   TOTAL THYROIDECTOMY Bilateral 1995   WISDOM TOOTH EXTRACTION Bilateral    Patient Active Problem List   Diagnosis Date Noted   H/O total shoulder replacement, left 06/16/2023   Colonic mass 11/03/2022   Malignant neoplasm of upper-outer quadrant of left breast in female, estrogen receptor positive (HCC) 08/27/2021   Abnormal ultrasound of endometrium 04/08/2021    PCP: Dr Daisy Floro   REFERRING PROVIDER:  Dr Malon Kindle   REFERRING DIAG: L reverse TSA  THERAPY DIAG:  Other symptoms and signs involving the musculoskeletal system  Muscle weakness (generalized)  Abnormal posture  Chronic left shoulder pain  Stiffness of left shoulder, not elsewhere classified  Rationale for Evaluation and Treatment: Rehabilitation  ONSET DATE: surgery 06/16/23  SUBJECTIVE:                                                                                                                                                                                      SUBJECTIVE STATEMENT: "It's getting there, slowly but surely. It's more stiffness."   EVALUATION:Patient reports that she has had L shoulder pain for ~ 5  years with gradual increase in symptoms including pain and weakness L UE. She has difficulty with functional activities involving L UE. She could not even comb her hair with the L UE. She has difficulty sleeping due to aching L UE. Underwent L reverse TSA 06/16/23 with no complications. She is having some discomfort and stiffness in the L shoulder. She has limited ROM.  Hand dominance: Right  PERTINENT HISTORY: Arthritis; HTN; gall bladder removed; L TKA 2014; L posterior hip pain; L shoulder pain; L reverse TSA;  L breast cancer continues on estrogen inhibitor treatment   PAIN:  Are you having pain? Yes: NPRS scale: 3/10 Pain location: L shoulder  Pain description: stiffness and discomfort  Aggravating factors: movement Relieving factors: rest and meds  PRECAUTIONS: Other: reverse TSA protocol   WEIGHT BEARING RESTRICTIONS: Yes no weight bearing L UE   FALLS:  Has patient fallen in last 6 months? No  LIVING ENVIRONMENT: Lives alone Lives in: House/apartment Stairs: No  OCCUPATION: Retired Cytogeneticist work; active with household chores; sewing quilting; reading   PATIENT GOALS:to use L arm for functional activities again   NEXT MD VISIT: 09/13/23  OBJECTIVE:   DIAGNOSTIC FINDINGS:  CT scan 2023: IMPRESSION: 1. Severe left glenohumeral joint degenerative changes as detailed above. 2. Moderate AC joint degenerative changes. 3. Grossly by CT the rotator cuff tendons are intact.  PATIENT SURVEYS:  FOTO not done for shoulder      SENSATION: WFL  POSTURE: Patient presents with head forward posture with increased thoracic kyphosis; shoulders rounded and elevated; scapulae abducted and rotated along the thoracic spine; head of the humerus anterior in orientation.   UPPER EXTREMITY ROM:   Active ROM Right eval Left eval  Shoulder flexion 114 65  Shoulder extension 49 NT  Shoulder abduction 84 67  Shoulder adduction    Shoulder internal rotation Hand to lateral hip  NT  Shoulder external rotation 40 elbow at side  19 elbow at side  Elbow flexion    Elbow extension    Wrist flexion    Wrist extension    Wrist ulnar deviation    Wrist radial deviation    Wrist pronation    Wrist supination    (Blank rows = not tested)  UPPER EXTREMITY MMT: not tested   MMT Right eval Left eval  Shoulder flexion    Shoulder extension    Shoulder abduction    Shoulder adduction    Shoulder internal rotation    Shoulder external rotation    Middle trapezius    Lower trapezius    Elbow flexion    Elbow extension    Wrist flexion    Wrist extension    Wrist ulnar deviation    Wrist radial deviation    Wrist pronation    Wrist supination    Grip strength (lbs)    (Blank rows = not tested)  JOINT MOBILITY TESTING:  N/A  PALPATION:  Tightness L shoulder girdle  [facility] Adult PT Treatment:                                                DATE: 08/14/23 Therapeutic Exercise: Lt shoulder flexion AROM in supine, long lever to 90 degrees 2 x 5  Shoulder isometrics abduction, ER, IR,  extension 5 x 5 sec hold  Shoulder ER AAROM with dowel in standing x 10; 5 sec hold  Reviewed HEP Manual Therapy: Lt shoulder PROM to tolerance within post-op restrictions    [facility] Adult PT Treatment:                                                DATE: 08/10/23 Therapeutic Exercise: Sitting Scap squeeze with noodle 10 sec x 10  Table slide 10 sec x 8 ER with cane 10 sec x 5 (protocol limit no > than 30 deg) Shoulder extension isometric 5 sec x 5  Shoulder abduction isometric 5 sec x 5  Shoulder ER isometric 5 sec x 5 Shoulder IR isometric 5 sec x 5  Pulley flexion 5 sec x 5 (some discomfort) Pulley shoulder horizontal ab/adduction through range to tolerance x 5 Elbow extension UE in neutral at side 5 sec x 5  Manual:  STM L shoulder girdle   [facility] Adult PT Treatment:                                                DATE: 08/02/23 Therapeutic Exercise: Sitting Scap squeeze  with noodle 10 sec x 10  Table slide 10 sec x 5 ER with cane 10 sec x 5 (protocol limit no > than 30 deg)     PATIENT EDUCATION: Education details: HEP  Person educated: Patient Education method: Programmer, multimedia, Demonstration, Tactile cues, Verbal cues, and Handouts Education comprehension: verbalized understanding, returned demonstration, verbal cues required, tactile cues required, and needs further education  HOME EXERCISE PROGRAM:  Access Code: [email or web address] URL: [email or web address] Date: 08/14/2023 Prepared by: Letitia Libra  Exercises - Seated Scapular Retraction  - 2 x daily - 7 x weekly - 1-2 sets - 10 reps - 10 sec  hold - Seated Bilateral Shoulder Flexion Towel Slide at Table Top  - 2 x daily - 7 x weekly - 1 sets - 5-10 reps - 10 sec  hold - Seated Shoulder External Rotation AAROM with Cane and Hand in Neutral  - 2 x daily - 7 x weekly - 1 sets - 5-10 reps - 5-10 sec  hold - Isometric Shoulder Extension at Wall  - 2 x daily - 7 x weekly - 1 sets - 5-10 reps - 5 sec  hold - Standing Isometric Shoulder Abduction with Doorway - Arm Bent  - 2 x daily - 7 x weekly - 1 sets - 5-10 reps - 5 sec  hold - Standing Isometric Shoulder External Rotation with Doorway  - 2 x daily - 7 x weekly - 1 sets - 5-10 reps - 5 sec  hold - Standing Isometric Shoulder Internal Rotation at Doorway  - 2 x daily - 7 x weekly - 1 sets - 5-10 reps - 5 sec  hold - Seated Shoulder Flexion AAROM with Pulley Behind  - 2 x daily - 7 x weekly - 1 sets - 10 reps - 10 sec  hold - Seated Shoulder Scaption AAROM with Pulley at Side  - 2 x daily - 7 x weekly - 1 sets - 10 reps - 10sec  hold  ASSESSMENT:  CLINICAL IMPRESSION: Patient is making steady progress 8.5 weeks s/p Lt reverse TSA. Initiated Lt shoulder AROM in gravity eliminated positioning with good tolerance without an increase in pain noted. She was able to complete 90 degrees of controlled shoulder flexion AROM in supine without compensatory  shoulder shrug, but quickly fatigues with this exercise. Good tolerance to PROM, though ER remains fairly restricted. Reviewed isometrics with patient requiring cues for setup as well as to reduce shoulder shrug. HEP was modified for isometrics at the doorway vs. The wall. No increase in pain reported throughout session.   EVAL: Patient is a 77 y.o. female who was seen today for physical therapy evaluation and treatment for shoulder rehab s/p L reverse TSA 06/16/23. She presents with poor posture and alignment; limited L shoulder ROM, strength, mobility, function; limited ADL's; pain on an intermittent basis. She will benefit from PT to address problems identified.   OBJECTIVE IMPAIRMENTS: decreased activity tolerance, decreased mobility, decreased ROM, decreased strength, hypomobility, increased fascial restrictions, increased muscle spasms, impaired flexibility, impaired UE functional use, improper body mechanics, postural dysfunction, obesity, and pain.    GOALS: Goals reviewed with patient? Yes  SHORT TERM GOALS: Target date: 09/13/2023   Independent in initial HEP  Baseline: Goal status: INITIAL  2.  Patient to demonstrate improved scapular control with posterior shoulder girdle engaged for exercises  Baseline:  Goal status: INITIAL   LONG TERM GOALS: Target date: 10/25/2023   Improve posture and alignment with patient to demonstrate improved posterior shoulder activation and control for functional movements  Baseline:  Goal status: INITIAL  2.  Increase active AROM L shoulder to ~ equal to AROM R shoulder for flexion; scaption; ER  Baseline:  Goal status: INITIAL  3.  4/5 strength L shoulder  Baseline:  Goal status: INITIAL  4.  Patient reports ability to use L UE for functional activities required for independent living  Baseline:  Goal status: INITIAL  5.  Independent in HEP including aquatic program as indicated  Baseline:  Goal status: INITIAL   PLAN:  PT  FREQUENCY: 1-2 times/week  PT DURATION: 12 weeks  PLANNED INTERVENTIONS: Therapeutic  exercises, Therapeutic activity, Neuromuscular re-education, Patient/Family education, Self Care, Joint mobilization, Aquatic Therapy, Dry Needling, Electrical stimulation, Spinal mobilization, Cryotherapy, Moist heat, Ultrasound, Ionotophoresis 4mg /ml Dexamethasone, Manual therapy, and Re-evaluation  PLAN FOR NEXT SESSION: review and progress with exercise program per protocol; manual work, DN, modalities as indicated   Letitia Libra, PT, DPT, ATC 08/14/23 12:37 PM

## 2023-08-17 ENCOUNTER — Ambulatory Visit: Payer: Medicare Other | Admitting: Physical Therapy

## 2023-08-17 ENCOUNTER — Encounter: Payer: Self-pay | Admitting: Physical Therapy

## 2023-08-17 DIAGNOSIS — M5459 Other low back pain: Secondary | ICD-10-CM | POA: Diagnosis not present

## 2023-08-17 DIAGNOSIS — R293 Abnormal posture: Secondary | ICD-10-CM

## 2023-08-17 DIAGNOSIS — G8929 Other chronic pain: Secondary | ICD-10-CM

## 2023-08-17 DIAGNOSIS — M6281 Muscle weakness (generalized): Secondary | ICD-10-CM

## 2023-08-17 DIAGNOSIS — R29898 Other symptoms and signs involving the musculoskeletal system: Secondary | ICD-10-CM

## 2023-08-17 NOTE — Therapy (Signed)
OUTPATIENT PHYSICAL THERAPY TREATMENT   Patient Name: Brooke Duncan MRN: [id number] DOB:02-15-1946, 77 y.o., female Today's Date: 08/17/2023  END OF SESSION:  PT End of Session - 08/17/23 1229     Visit Number 4    Number of Visits 24    Date for PT Re-Evaluation 10/25/23    Authorization Type medicare + BCBS supplemental    Authorization - Visit Number 4    Progress Note Due on Visit 10    PT Start Time 1146    PT Stop Time 1229    PT Time Calculation (min) 43 min    Activity Tolerance Patient tolerated treatment well    Behavior During Therapy WFL for tasks assessed/performed               Past Medical History:  Diagnosis Date   Acquired solitary kidney    right side 2014   Arthritis    Bronchitis 05/08/2023   Cancer (HCC)    renal cell carcinoma- left kidney removed   Complication of anesthesia    slow to wake, hard to take a deep breath with 1 surgery several yrs ago   Frequency of urination    Headache    Heart murmur    per told by pcp approx. 02/ 2022 heard a very faint murmur, told did need work-up done at this time   History of COVID-19 2021   all symptoms resolved   History of Hashimoto thyroiditis    s/p  total thyroidectomy 1995   History of hypercalcemia    s/p  parathryoidectomy 07/ 2021   History of renal cell carcinoma 2014   s/p  left nephrectomy, pt stated no other treatment   Hypertension    followed by pcp   Hypothyroidism, postsurgical 1995   followed by pcp in [place], moved from [place] 01/ 2021   OSA on CPAP    Thickened endometrium    UTI (urinary tract infection)    finished keflex 08-12-2022   Wears glasses    Past Surgical History:  Procedure Laterality Date   BREAST BIOPSY Left 08/24/2021   BREAST LUMPECTOMY WITH RADIOACTIVE SEED LOCALIZATION Left 07/14/2022   Procedure: LEFT BREAST LUMPECTOMY WITH RADIOACTIVE SEED LOCALIZATION;  Surgeon: Emelia Loron, MD;  Location: [facility];  Service: General;   Laterality: Left;   CYSTOSCOPY  08/04/2022   in dr winter office   CYSTOSCOPY WITH BIOPSY N/A 09/02/2022   Procedure: CYSTOSCOPY WITH BLADDER BIOPSY;  Surgeon: Rene Paci, MD;  Location: [facility];  Service: Urology;  Laterality: N/A;  ONLY NEEDS 30 MIN   HYSTEROSCOPY WITH D & C N/A 04/08/2021   Procedure: DILATATION AND CURETTAGE /HYSTEROSCOPY, EXCISION OF VAGINAL LESION;  Surgeon: Silverio Lay, MD;  Location: [facility];  Service: Gynecology;  Laterality: N/A;   LAPAROSCOPIC CHOLECYSTECTOMY  2005   NEPHRECTOMY Left 2014   PARATHYROIDECTOMY  07/ 2021  in [place]   REVERSE SHOULDER ARTHROPLASTY Left 06/16/2023   Procedure: REVERSE SHOULDER ARTHROPLASTY;  Surgeon: Beverely Low, MD;  Location: [facility];  Service: Orthopedics;  Laterality: Left;   TONSILLECTOMY  age 80   TOTAL HIP ARTHROPLASTY Bilateral left 2015;  right 2009   TOTAL KNEE ARTHROPLASTY Left 2014   TOTAL THYROIDECTOMY Bilateral 1995   WISDOM TOOTH EXTRACTION Bilateral    Patient Active Problem List   Diagnosis Date Noted   H/O total shoulder replacement, left 06/16/2023   Colonic mass 11/03/2022   Malignant neoplasm of upper-outer quadrant of  left breast in female, estrogen receptor positive (HCC) 08/27/2021   Abnormal ultrasound of endometrium 04/08/2021    PCP: Dr Daisy Floro   REFERRING PROVIDER: Dr Malon Kindle   REFERRING DIAG: L reverse TSA  THERAPY DIAG:  Other symptoms and signs involving the musculoskeletal system  Muscle weakness (generalized)  Chronic left shoulder pain  Abnormal posture  Rationale for Evaluation and Treatment: Rehabilitation  ONSET DATE: surgery 06/16/23  SUBJECTIVE:                                                                                                                                                                                      SUBJECTIVE STATEMENT: Pt with no new complaints. States she can tell she is  making progress  EVALUATION:Patient reports that she has had L shoulder pain for ~ 5 years with gradual increase in symptoms including pain and weakness L UE. She has difficulty with functional activities involving L UE. She could not even comb her hair with the L UE. She has difficulty sleeping due to aching L UE. Underwent L reverse TSA 06/16/23 with no complications. She is having some discomfort and stiffness in the L shoulder. She has limited ROM.  Hand dominance: Right  PERTINENT HISTORY: Arthritis; HTN; gall bladder removed; L TKA 2014; L posterior hip pain; L shoulder pain; L reverse TSA;  L breast cancer continues on estrogen inhibitor treatment   PAIN:  Are you having pain? Yes: NPRS scale: 3/10 Pain location: L shoulder  Pain description: stiffness and discomfort  Aggravating factors: movement Relieving factors: rest and meds  PRECAUTIONS: Other: reverse TSA protocol   WEIGHT BEARING RESTRICTIONS: Yes no weight bearing L UE   FALLS:  Has patient fallen in last 6 months? No  LIVING ENVIRONMENT: Lives alone Lives in: House/apartment Stairs: No  OCCUPATION: Retired Cytogeneticist work; active with household chores; sewing quilting; reading   PATIENT GOALS:to use L arm for functional activities again   NEXT MD VISIT: 09/13/23  OBJECTIVE:   DIAGNOSTIC FINDINGS:  CT scan 2023: IMPRESSION: 1. Severe left glenohumeral joint degenerative changes as detailed above. 2. Moderate AC joint degenerative changes. 3. Grossly by CT the rotator cuff tendons are intact.  PATIENT SURVEYS:  FOTO not done for shoulder      SENSATION: WFL  POSTURE: Patient presents with head forward posture with increased thoracic kyphosis; shoulders rounded and elevated; scapulae abducted and rotated along the thoracic spine; head of the humerus anterior in orientation.   UPPER EXTREMITY ROM:   Active ROM Right eval Left eval  Shoulder flexion 114 65  Shoulder extension 49 NT  Shoulder  abduction 84 67  Shoulder adduction  Shoulder internal rotation Hand to lateral hip NT  Shoulder external rotation 40 elbow at side  19 elbow at side   Elbow flexion    Elbow extension    Wrist flexion    Wrist extension    Wrist ulnar deviation    Wrist radial deviation    Wrist pronation    Wrist supination    (Blank rows = not tested)  UPPER EXTREMITY MMT: not tested   MMT Right eval Left eval  Shoulder flexion    Shoulder extension    Shoulder abduction    Shoulder adduction    Shoulder internal rotation    Shoulder external rotation    Middle trapezius    Lower trapezius    Elbow flexion    Elbow extension    Wrist flexion    Wrist extension    Wrist ulnar deviation    Wrist radial deviation    Wrist pronation    Wrist supination    Grip strength (lbs)    (Blank rows = not tested)  JOINT MOBILITY TESTING:  N/A  PALPATION:  Tightness L shoulder girdle  [facility] Adult PT Treatment:                                                DATE: 08/17/23 Therapeutic Exercise: Pulley flexion and abd for warm up within protocol limits x 2 min Shoulder isometrics abduction, ER, IR,  extension 5 x 5 sec hold  Row red TB x 10 Shoulder ext red TB x 10 Lt shoulder AAROM ER with dowel seated 10 x 5 sec hold Scap squeeze 10 x 10 sec Lt shoulder flexion AROM in supine, long lever to 90 degrees 2 x 5  Manual Therapy: PROM Lt shoulder to tolerance STM Lt shoulder musculature   [facility] Adult PT Treatment:                                                DATE: 08/14/23 Therapeutic Exercise: Lt shoulder flexion AROM in supine, long lever to 90 degrees 2 x 5  Shoulder isometrics abduction, ER, IR,  extension 5 x 5 sec hold  Shoulder ER AAROM with dowel in standing x 10; 5 sec hold  Reviewed HEP Manual Therapy: Lt shoulder PROM to tolerance within post-op restrictions    [facility] Adult PT Treatment:                                                DATE: 08/10/23 Therapeutic  Exercise: Sitting Scap squeeze with noodle 10 sec x 10  Table slide 10 sec x 8 ER with cane 10 sec x 5 (protocol limit no > than 30 deg) Shoulder extension isometric 5 sec x 5  Shoulder abduction isometric 5 sec x 5  Shoulder ER isometric 5 sec x 5 Shoulder IR isometric 5 sec x 5  Pulley flexion 5 sec x 5 (some discomfort) Pulley shoulder horizontal ab/adduction through range to tolerance x 5 Elbow extension UE in neutral at side 5 sec x 5  Manual:  STM L shoulder girdle  PATIENT EDUCATION: Education details: HEP  Person educated: Patient Education method: Solicitor, Actor cues, Verbal cues, and Handouts Education comprehension: verbalized understanding, returned demonstration, verbal cues required, tactile cues required, and needs further education  HOME EXERCISE PROGRAM:  Access Code: [email or web address] URL: [email or web address] Date: 08/14/2023 Prepared by: Letitia Libra  Exercises - Seated Scapular Retraction  - 2 x daily - 7 x weekly - 1-2 sets - 10 reps - 10 sec  hold - Seated Bilateral Shoulder Flexion Towel Slide at Table Top  - 2 x daily - 7 x weekly - 1 sets - 5-10 reps - 10 sec  hold - Seated Shoulder External Rotation AAROM with Cane and Hand in Neutral  - 2 x daily - 7 x weekly - 1 sets - 5-10 reps - 5-10 sec  hold - Isometric Shoulder Extension at Wall  - 2 x daily - 7 x weekly - 1 sets - 5-10 reps - 5 sec  hold - Standing Isometric Shoulder Abduction with Doorway - Arm Bent  - 2 x daily - 7 x weekly - 1 sets - 5-10 reps - 5 sec  hold - Standing Isometric Shoulder External Rotation with Doorway  - 2 x daily - 7 x weekly - 1 sets - 5-10 reps - 5 sec  hold - Standing Isometric Shoulder Internal Rotation at Doorway  - 2 x daily - 7 x weekly - 1 sets - 5-10 reps - 5 sec  hold - Seated Shoulder Flexion AAROM with Pulley Behind  - 2 x daily - 7 x weekly - 1 sets - 10 reps - 10 sec  hold - Seated Shoulder Scaption AAROM with Pulley at Side  -  2 x daily - 7 x weekly - 1 sets - 10 reps - 10sec  hold  ASSESSMENT:  CLINICAL IMPRESSION: Pt continues to tolerate exercise progression well. She was able to add rows with theraband with no increase in pain. She continues with decreased strength and ROM and will continue to benefit from skilled PT  EVAL: Patient is a 77 y.o. female who was seen today for physical therapy evaluation and treatment for shoulder rehab s/p L reverse TSA 06/16/23. She presents with poor posture and alignment; limited L shoulder ROM, strength, mobility, function; limited ADL's; pain on an intermittent basis. She will benefit from PT to address problems identified.   OBJECTIVE IMPAIRMENTS: decreased activity tolerance, decreased mobility, decreased ROM, decreased strength, hypomobility, increased fascial restrictions, increased muscle spasms, impaired flexibility, impaired UE functional use, improper body mechanics, postural dysfunction, obesity, and pain.    GOALS: Goals reviewed with patient? Yes  SHORT TERM GOALS: Target date: 09/13/2023   Independent in initial HEP  Baseline: Goal status: INITIAL  2.  Patient to demonstrate improved scapular control with posterior shoulder girdle engaged for exercises  Baseline:  Goal status: INITIAL   LONG TERM GOALS: Target date: 10/25/2023   Improve posture and alignment with patient to demonstrate improved posterior shoulder activation and control for functional movements  Baseline:  Goal status: INITIAL  2.  Increase active AROM L shoulder to ~ equal to AROM R shoulder for flexion; scaption; ER  Baseline:  Goal status: INITIAL  3.  4/5 strength L shoulder  Baseline:  Goal status: INITIAL  4.  Patient reports ability to use L UE for functional activities required for independent living  Baseline:  Goal status: INITIAL  5.  Independent in HEP including aquatic program as indicated  Baseline:  Goal status: INITIAL  PLAN:  PT FREQUENCY: 1-2  times/week  PT DURATION: 12 weeks  PLANNED INTERVENTIONS: Therapeutic exercises, Therapeutic activity, Neuromuscular re-education, Patient/Family education, Self Care, Joint mobilization, Aquatic Therapy, Dry Needling, Electrical stimulation, Spinal mobilization, Cryotherapy, Moist heat, Ultrasound, Ionotophoresis 4mg /ml Dexamethasone, Manual therapy, and Re-evaluation  PLAN FOR NEXT SESSION: review and progress with exercise program per protocol; manual work, DN, modalities as indicated   Reggy Eye, PT 08/17/23 12:30 PM

## 2023-08-21 ENCOUNTER — Encounter: Payer: Self-pay | Admitting: Physical Therapy

## 2023-08-21 ENCOUNTER — Inpatient Hospital Stay: Payer: Medicare Other | Attending: Hematology and Oncology | Admitting: Hematology and Oncology

## 2023-08-21 ENCOUNTER — Other Ambulatory Visit: Payer: Self-pay | Admitting: Family Medicine

## 2023-08-21 ENCOUNTER — Ambulatory Visit: Payer: Medicare Other | Admitting: Physical Therapy

## 2023-08-21 VITALS — BP 154/82 | HR 80 | Temp 97.8°F | Resp 18 | Ht 66.0 in | Wt 276.6 lb

## 2023-08-21 DIAGNOSIS — R29898 Other symptoms and signs involving the musculoskeletal system: Secondary | ICD-10-CM

## 2023-08-21 DIAGNOSIS — Z96612 Presence of left artificial shoulder joint: Secondary | ICD-10-CM | POA: Insufficient documentation

## 2023-08-21 DIAGNOSIS — C50412 Malignant neoplasm of upper-outer quadrant of left female breast: Secondary | ICD-10-CM | POA: Insufficient documentation

## 2023-08-21 DIAGNOSIS — Z170 Estrogen receptor positive status [ER+]: Secondary | ICD-10-CM | POA: Insufficient documentation

## 2023-08-21 DIAGNOSIS — G8929 Other chronic pain: Secondary | ICD-10-CM

## 2023-08-21 DIAGNOSIS — Z780 Asymptomatic menopausal state: Secondary | ICD-10-CM | POA: Diagnosis not present

## 2023-08-21 DIAGNOSIS — M25612 Stiffness of left shoulder, not elsewhere classified: Secondary | ICD-10-CM

## 2023-08-21 DIAGNOSIS — R1904 Left lower quadrant abdominal swelling, mass and lump: Secondary | ICD-10-CM

## 2023-08-21 DIAGNOSIS — R293 Abnormal posture: Secondary | ICD-10-CM

## 2023-08-21 DIAGNOSIS — Z79899 Other long term (current) drug therapy: Secondary | ICD-10-CM | POA: Diagnosis not present

## 2023-08-21 DIAGNOSIS — R1032 Left lower quadrant pain: Secondary | ICD-10-CM

## 2023-08-21 DIAGNOSIS — M6281 Muscle weakness (generalized): Secondary | ICD-10-CM

## 2023-08-21 DIAGNOSIS — M8580 Other specified disorders of bone density and structure, unspecified site: Secondary | ICD-10-CM | POA: Diagnosis not present

## 2023-08-21 DIAGNOSIS — M5459 Other low back pain: Secondary | ICD-10-CM | POA: Diagnosis not present

## 2023-08-21 NOTE — Therapy (Signed)
OUTPATIENT PHYSICAL THERAPY TREATMENT   Patient Name: Brooke Duncan MRN: [id number] DOB:September 23, 1946, 77 y.o., female Today's Date: 08/21/2023  END OF SESSION:  PT End of Session - 08/21/23 1448     Visit Number 5    Number of Visits 24    Date for PT Re-Evaluation 10/25/23    Authorization Type medicare + BCBS supplemental    Progress Note Due on Visit 10    PT Start Time 1448    PT Stop Time 1526    PT Time Calculation (min) 38 min    Activity Tolerance Patient tolerated treatment well    Behavior During Therapy WFL for tasks assessed/performed                Past Medical History:  Diagnosis Date   Acquired solitary kidney    right side 2014   Arthritis    Bronchitis 05/08/2023   Cancer (HCC)    renal cell carcinoma- left kidney removed   Complication of anesthesia    slow to wake, hard to take a deep breath with 1 surgery several yrs ago   Frequency of urination    Headache    Heart murmur    per told by pcp approx. 02/ 2022 heard a very faint murmur, told did need work-up done at this time   History of COVID-19 2021   all symptoms resolved   History of Hashimoto thyroiditis    s/p  total thyroidectomy 1995   History of hypercalcemia    s/p  parathryoidectomy 07/ 2021   History of renal cell carcinoma 2014   s/p  left nephrectomy, pt stated no other treatment   Hypertension    followed by pcp   Hypothyroidism, postsurgical 1995   followed by pcp in [place], moved from [place] 01/ 2021   OSA on CPAP    Thickened endometrium    UTI (urinary tract infection)    finished keflex 08-12-2022   Wears glasses    Past Surgical History:  Procedure Laterality Date   BREAST BIOPSY Left 08/24/2021   BREAST LUMPECTOMY WITH RADIOACTIVE SEED LOCALIZATION Left 07/14/2022   Procedure: LEFT BREAST LUMPECTOMY WITH RADIOACTIVE SEED LOCALIZATION;  Surgeon: Emelia Loron, MD;  Location: [facility];  Service: General;  Laterality: Left;   CYSTOSCOPY   08/04/2022   in dr winter office   CYSTOSCOPY WITH BIOPSY N/A 09/02/2022   Procedure: CYSTOSCOPY WITH BLADDER BIOPSY;  Surgeon: Rene Paci, MD;  Location: [facility];  Service: Urology;  Laterality: N/A;  ONLY NEEDS 30 MIN   HYSTEROSCOPY WITH D & C N/A 04/08/2021   Procedure: DILATATION AND CURETTAGE /HYSTEROSCOPY, EXCISION OF VAGINAL LESION;  Surgeon: Silverio Lay, MD;  Location: [facility];  Service: Gynecology;  Laterality: N/A;   LAPAROSCOPIC CHOLECYSTECTOMY  2005   NEPHRECTOMY Left 2014   PARATHYROIDECTOMY  07/ 2021  in [place]   REVERSE SHOULDER ARTHROPLASTY Left 06/16/2023   Procedure: REVERSE SHOULDER ARTHROPLASTY;  Surgeon: Beverely Low, MD;  Location: [facility];  Service: Orthopedics;  Laterality: Left;   TONSILLECTOMY  age 67   TOTAL HIP ARTHROPLASTY Bilateral left 2015;  right 2009   TOTAL KNEE ARTHROPLASTY Left 2014   TOTAL THYROIDECTOMY Bilateral 1995   WISDOM TOOTH EXTRACTION Bilateral    Patient Active Problem List   Diagnosis Date Noted   H/O total shoulder replacement, left 06/16/2023   Colonic mass 11/03/2022   Malignant neoplasm of upper-outer quadrant of left breast in female, estrogen receptor positive (  HCC) 08/27/2021   Abnormal ultrasound of endometrium 04/08/2021    PCP: Dr Daisy Floro   REFERRING PROVIDER: Dr Malon Kindle   REFERRING DIAG: L reverse TSA  THERAPY DIAG:  Other symptoms and signs involving the musculoskeletal system  Muscle weakness (generalized)  Chronic left shoulder pain  Abnormal posture  Stiffness of left shoulder, not elsewhere classified  Rationale for Evaluation and Treatment: Rehabilitation  ONSET DATE: surgery 06/16/23  SUBJECTIVE:                                                                                                                                                                                      SUBJECTIVE STATEMENT:  Shoulder is feeling better. Of  course I've not been working on getting it behind me much and ROM in general is still a bit stiff. No falls since last time. I have a bad right shoulder that hasn't been fixed yet.   EVALUATION:Patient reports that she has had L shoulder pain for ~ 5 years with gradual increase in symptoms including pain and weakness L UE. She has difficulty with functional activities involving L UE. She could not even comb her hair with the L UE. She has difficulty sleeping due to aching L UE. Underwent L reverse TSA 06/16/23 with no complications. She is having some discomfort and stiffness in the L shoulder. She has limited ROM.  Hand dominance: Right  PERTINENT HISTORY: Arthritis; HTN; gall bladder removed; L TKA 2014; L posterior hip pain; L shoulder pain; L reverse TSA;  L breast cancer continues on estrogen inhibitor treatment   PAIN:  Are you having pain? Yes: NPRS scale: 3/10 now  Pain location: L shoulder  Pain description: stiffness, some muscle soreness in upper arm Aggravating factors: movement Relieving factors: rest and meds  PRECAUTIONS: Other: reverse TSA protocol   WEIGHT BEARING RESTRICTIONS: Yes no weight bearing L UE   FALLS:  Has patient fallen in last 6 months? No  LIVING ENVIRONMENT: Lives alone Lives in: House/apartment Stairs: No  OCCUPATION: Retired Cytogeneticist work; active with household chores; sewing quilting; reading   PATIENT GOALS:to use L arm for functional activities again   NEXT MD VISIT: 09/13/23  OBJECTIVE:   DIAGNOSTIC FINDINGS:  CT scan 2023: IMPRESSION: 1. Severe left glenohumeral joint degenerative changes as detailed above. 2. Moderate AC joint degenerative changes. 3. Grossly by CT the rotator cuff tendons are intact.  PATIENT SURVEYS:  FOTO not done for shoulder      SENSATION: WFL  POSTURE: Patient presents with head forward posture with increased thoracic kyphosis; shoulders rounded and elevated; scapulae abducted and rotated along the  thoracic spine; head of the  humerus anterior in orientation.   UPPER EXTREMITY ROM:   Active ROM Right eval Left eval  Shoulder flexion 114 65  Shoulder extension 49 NT  Shoulder abduction 84 67  Shoulder adduction    Shoulder internal rotation Hand to lateral hip NT  Shoulder external rotation 40 elbow at side  19 elbow at side   Elbow flexion    Elbow extension    Wrist flexion    Wrist extension    Wrist ulnar deviation    Wrist radial deviation    Wrist pronation    Wrist supination    (Blank rows = not tested)  UPPER EXTREMITY MMT: not tested   MMT Right eval Left eval  Shoulder flexion    Shoulder extension    Shoulder abduction    Shoulder adduction    Shoulder internal rotation    Shoulder external rotation    Middle trapezius    Lower trapezius    Elbow flexion    Elbow extension    Wrist flexion    Wrist extension    Wrist ulnar deviation    Wrist radial deviation    Wrist pronation    Wrist supination    Grip strength (lbs)    (Blank rows = not tested)  JOINT MOBILITY TESTING:  N/A  PALPATION:  Tightness L shoulder girdle  [facility] Adult PT Treatment:                                                DATE:   08/21/23  TherEx  Supine left shoulder flexion AROM x10 0# long lever arm Serratus punches left shoulder 0# x10  Serratus punch + CW circles x10/xCCW circles x10 Seated scap retractions x20 with 3 second holds  Wall ladder for flexion AAROM x8 with 3 second holds at top range  Scap retractions with red TB x10 x2-3 seconds  Gentle IR glute taps with caution towards range x10   Manual   PROM surgical shoulder as appropriate per protocol     08/17/23 Therapeutic Exercise: Pulley flexion and abd for warm up within protocol limits x 2 min Shoulder isometrics abduction, ER, IR,  extension 5 x 5 sec hold  Row red TB x 10 Shoulder ext red TB x 10 Lt shoulder AAROM ER with dowel seated 10 x 5 sec hold Scap squeeze 10 x 10 sec Lt shoulder  flexion AROM in supine, long lever to 90 degrees 2 x 5  Manual Therapy: PROM Lt shoulder to tolerance STM Lt shoulder musculature   [facility] Adult PT Treatment:                                                DATE: 08/14/23 Therapeutic Exercise: Lt shoulder flexion AROM in supine, long lever to 90 degrees 2 x 5  Shoulder isometrics abduction, ER, IR,  extension 5 x 5 sec hold  Shoulder ER AAROM with dowel in standing x 10; 5 sec hold  Reviewed HEP Manual Therapy: Lt shoulder PROM to tolerance within post-op restrictions    [facility] Adult PT Treatment:  DATE: 08/10/23 Therapeutic Exercise: Sitting Scap squeeze with noodle 10 sec x 10  Table slide 10 sec x 8 ER with cane 10 sec x 5 (protocol limit no > than 30 deg) Shoulder extension isometric 5 sec x 5  Shoulder abduction isometric 5 sec x 5  Shoulder ER isometric 5 sec x 5 Shoulder IR isometric 5 sec x 5  Pulley flexion 5 sec x 5 (some discomfort) Pulley shoulder horizontal ab/adduction through range to tolerance x 5 Elbow extension UE in neutral at side 5 sec x 5  Manual:  STM L shoulder girdle      PATIENT EDUCATION: Education details: HEP  Person educated: Patient Education method: Programmer, multimedia, Demonstration, Tactile cues, Verbal cues, and Handouts Education comprehension: verbalized understanding, returned demonstration, verbal cues required, tactile cues required, and needs further education  HOME EXERCISE PROGRAM:  Access Code: [email or web address] URL: [email or web address] Date: 08/14/2023 Prepared by: Letitia Libra  Exercises - Seated Scapular Retraction  - 2 x daily - 7 x weekly - 1-2 sets - 10 reps - 10 sec  hold - Seated Bilateral Shoulder Flexion Towel Slide at Table Top  - 2 x daily - 7 x weekly - 1 sets - 5-10 reps - 10 sec  hold - Seated Shoulder External Rotation AAROM with Cane and Hand in Neutral  - 2 x daily - 7 x weekly - 1 sets - 5-10 reps - 5-10 sec  hold -  Isometric Shoulder Extension at Wall  - 2 x daily - 7 x weekly - 1 sets - 5-10 reps - 5 sec  hold - Standing Isometric Shoulder Abduction with Doorway - Arm Bent  - 2 x daily - 7 x weekly - 1 sets - 5-10 reps - 5 sec  hold - Standing Isometric Shoulder External Rotation with Doorway  - 2 x daily - 7 x weekly - 1 sets - 5-10 reps - 5 sec  hold - Standing Isometric Shoulder Internal Rotation at Doorway  - 2 x daily - 7 x weekly - 1 sets - 5-10 reps - 5 sec  hold - Seated Shoulder Flexion AAROM with Pulley Behind  - 2 x daily - 7 x weekly - 1 sets - 10 reps - 10 sec  hold - Seated Shoulder Scaption AAROM with Pulley at Side  - 2 x daily - 7 x weekly - 1 sets - 10 reps - 10sec  hold  ASSESSMENT:  CLINICAL IMPRESSION:  [name] arrives today doing well, we continued working on shoulder ROM and on functional exercises as able and tolerated/appropriate per protocol. Did well today, tolerated all interventions well but shoulder does still remain fairly stiff.   EVAL: Patient is a 77 y.o. female who was seen today for physical therapy evaluation and treatment for shoulder rehab s/p L reverse TSA 06/16/23. She presents with poor posture and alignment; limited L shoulder ROM, strength, mobility, function; limited ADL's; pain on an intermittent basis. She will benefit from PT to address problems identified.   OBJECTIVE IMPAIRMENTS: decreased activity tolerance, decreased mobility, decreased ROM, decreased strength, hypomobility, increased fascial restrictions, increased muscle spasms, impaired flexibility, impaired UE functional use, improper body mechanics, postural dysfunction, obesity, and pain.    GOALS: Goals reviewed with patient? Yes  SHORT TERM GOALS: Target date: 09/13/2023   Independent in initial HEP  Baseline: Goal status: INITIAL  2.  Patient to demonstrate improved scapular control with posterior shoulder girdle engaged for exercises  Baseline:  Goal status: INITIAL   LONG TERM GOALS:  Target date: 10/25/2023   Improve posture and alignment with patient to demonstrate improved posterior shoulder activation and control for functional movements  Baseline:  Goal status: INITIAL  2.  Increase active AROM L shoulder to ~ equal to AROM R shoulder for flexion; scaption; ER  Baseline:  Goal status: INITIAL  3.  4/5 strength L shoulder  Baseline:  Goal status: INITIAL  4.  Patient reports ability to use L UE for functional activities required for independent living  Baseline:  Goal status: INITIAL  5.  Independent in HEP including aquatic program as indicated  Baseline:  Goal status: INITIAL   PLAN:  PT FREQUENCY: 1-2 times/week  PT DURATION: 12 weeks  PLANNED INTERVENTIONS: Therapeutic exercises, Therapeutic activity, Neuromuscular re-education, Patient/Family education, Self Care, Joint mobilization, Aquatic Therapy, Dry Needling, Electrical stimulation, Spinal mobilization, Cryotherapy, Moist heat, Ultrasound, Ionotophoresis 4mg /ml Dexamethasone, Manual therapy, and Re-evaluation  PLAN FOR NEXT SESSION: review and progress with exercise program per protocol; manual work, DN, modalities as indicated   Nedra Hai, PT, DPT 08/21/23 3:26 PM

## 2023-08-21 NOTE — Assessment & Plan Note (Addendum)
08/24/2021: Screening mammogram revealed left breast asymmetry at 2 o'clock position measuring 1.6 cm.  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  MRI detected 2 additional masses measuring 5.2 cm and 2.4 cm (need biopsied) no lymphadenopathy   Recommendations:  1.  Neoadjuvant antiestrogen therapy with letrozole (until additional biopsies were performed) 2. 07/14/2022: Left lumpectomy: No residual cancer identified.  All margins are negative 3. Oncotype DX: 21 (7% distant recurrence at 9 years) 4.  Radiation omitted because of shoulder issues 5. Adjuvant antiestrogen therapy Letrozole started 01/09/23 ---------------------------------------------------------------------------------------------------------------------------------------- Letrozole toxicities: Mild hot flashes  Patient underwent a left shoulder replacement surgery and she is doing very well.  She think she might need a right shoulder replacement surgery.  Breast cancer surveillance: Breast exam 08/21/2023: Benign Mammogram will need to be ordered (delayed because of colon surgery and infection as well as a shoulder replacement surgery) Bone density has also been ordered  Return to clinic in 1 year for follow-up

## 2023-08-21 NOTE — Progress Notes (Signed)
Patient Care Team: Daisy Floro, MD as PCP - General (Family Medicine) Pershing Proud, RN as Oncology Nurse Navigator Donnelly Angelica, RN as Oncology Nurse Navigator Emelia Loron, MD as Consulting Physician (General Surgery) Serena Croissant, MD as Consulting Physician (Hematology and Oncology) Antony Blackbird, MD as Consulting Physician (Radiation Oncology)  DIAGNOSIS:  Encounter Diagnosis  Name Primary?   Malignant neoplasm of upper-outer quadrant of left breast in female, estrogen receptor positive (HCC) Yes    SUMMARY OF ONCOLOGIC HISTORY: Oncology History  Malignant neoplasm of upper-outer quadrant of left breast in female, estrogen receptor positive (HCC)  08/24/2021 Initial Diagnosis   MRI Breast: a triangular shaped area of enhancement measuring 4.4 x 2.2 x 1.8 cm, a linear area of low level non mass enhancement spanning 5.2 x 1.8 x 0.9 cm, and smaller rounded area of non mass enhancement spanning 2.3 x 2.4 x 1.6 cm in the left breast. 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   08/25/2021 -  Anti-estrogen oral therapy   Neoadjuvant letrozole   09/01/2021 Cancer Staging   Staging form: Breast, AJCC 8th Edition - Clinical stage from 09/01/2021: Stage IIA (cT3, cN0, cM0, G2, ER+, PR+, HER2-) - Signed by Serena Croissant, MD on 09/01/2021 Stage prefix: Initial diagnosis Histologic grading system: 3 grade system     CHIEF COMPLIANT:   Discussed the use of AI scribe software for clinical note transcription with the patient, who gave verbal consent to proceed.  History of Present Illness   The patient, with a history of breast cancer and shoulder pain, presents for a follow-up visit. She has been on letrozole since early in the year for breast cancer treatment and report tolerating it well with minimal side effects. She deny any significant hot flashes or impact on their quality of life. She do report some  evening warmth but do not find it bothersome.  In terms of joint stiffness and achiness, the patient reports having had a shoulder replacement in July and is currently in physical therapy for it. She still have limitations in raising her arm fully but report no pain. She also mention that her other shoulder is starting to worsen and may require intervention in the future.  The patient also reports some soreness on the side of her breast, but she are unsure if it is related to her armpit area due to her shoulder issues. She have not had a mammogram recently due to her shoulder pain and surgery.  In addition to her breast cancer and shoulder issues, the patient had a mass in her colon that required removal of 25% of her colon last year. Fortunately, there was no cancer found. She also report recurrent infections in her colon every March.  The patient was diagnosed with osteopenia and has been taking vitamin D. She have not been rechecked for this and will need to follow up. She also report needing a CT scan of her abdomen, which has been delayed due to scheduling issues.         ALLERGIES:  is allergic to wound dressing adhesive and tape.  MEDICATIONS:  Current Outpatient Medications  Medication Sig Dispense Refill   acetaminophen (TYLENOL) 500 MG tablet Take 2 tablets (1,000 mg total) by mouth every 6 (six) hours as needed. 30 tablet 0   albuterol (VENTOLIN HFA) 108 (90 Base) MCG/ACT inhaler Inhale 2 puffs into the lungs every 4 (four) hours as needed for shortness of breath or wheezing.  Ascorbic Acid (VITAMIN C PO) Take 1 tablet by mouth daily.     cephALEXin (KEFLEX) 250 MG capsule Take 250 mg by mouth daily.     Cholecalciferol (VITAMIN D3 SUPER STRENGTH) 50 MCG (2000 UT) CAPS Take 2,000 Units by mouth daily.     clotrimazole (LOTRIMIN) 1 % cream Apply 1 application  topically 2 (two) times daily as needed (irritation).     CRANBERRY PO Take 2 capsules by mouth daily.     D-MANNOSE PO  Take 4 capsules by mouth daily.     erythromycin ophthalmic ointment Place 1 application  into both eyes daily as needed (blepharitis).     HYDROcodone-acetaminophen (NORCO/VICODIN) 5-325 MG tablet Take 1-2 tablets by mouth every 6 (six) hours as needed for moderate pain or severe pain. 40 tablet 0   hydrocortisone 2.5 % cream Apply 1 Application topically 2 (two) times daily as needed (irritation).     letrozole (FEMARA) 2.5 MG tablet Take 1 tablet (2.5 mg total) by mouth daily. 90 tablet 3   levothyroxine (SYNTHROID) 150 MCG tablet Take 150 mcg by mouth daily before breakfast.     losartan (COZAAR) 50 MG tablet Take 50 mg by mouth daily.     methocarbamol (ROBAXIN) 500 MG tablet Take 1 tablet (500 mg total) by mouth every 8 (eight) hours as needed for muscle spasms. 40 tablet 1   NON FORMULARY Pt uses a cpap nightly     ondansetron (ZOFRAN) 4 MG tablet Take 1 tablet (4 mg total) by mouth every 8 (eight) hours as needed for vomiting, nausea or refractory nausea / vomiting. 30 tablet 1   oxybutynin (DITROPAN) 5 MG tablet Take 1 tablet (5 mg total) by mouth every 8 (eight) hours as needed for bladder spasms. 30 tablet 1   phenazopyridine (PYRIDIUM) 200 MG tablet Take 1 tablet (200 mg total) by mouth 3 (three) times daily as needed (for pain with urination). 30 tablet 0   Probiotic Product (PROBIOTIC DAILY PO) Take 1 capsule by mouth daily.     simvastatin (ZOCOR) 10 MG tablet Take 10 mg by mouth at bedtime.     tamsulosin (FLOMAX) 0.4 MG CAPS capsule Take 1 capsule (0.4 mg total) by mouth daily. (Patient taking differently: Take 0.4 mg by mouth every other day.) 30 capsule    vitamin B-12 (CYANOCOBALAMIN) 1000 MCG tablet Take 1,000 mcg by mouth daily.     No current facility-administered medications for this visit.    PHYSICAL EXAMINATION: ECOG PERFORMANCE STATUS: 1 - Symptomatic but completely ambulatory  Vitals:   08/21/23 1029  BP: (!) 154/82  Pulse: 80  Resp: 18  Temp: 97.8 F (36.6  C)  SpO2: 99%   Filed Weights   08/21/23 1029  Weight: 276 lb 9.6 oz (125.5 kg)    Physical Exam          (exam performed in the presence of a chaperone)  LABORATORY DATA:  I have reviewed the data as listed    Latest Ref Rng & Units 06/12/2023   10:39 AM 04/24/2023   12:02 PM 11/06/2022    4:07 AM  CMP  Glucose 70 - 99 mg/dL 413  92  244   BUN 8 - 23 mg/dL 18  20  10    Creatinine 0.44 - 1.00 mg/dL 0.10  2.72  5.36   Sodium 135 - 145 mmol/L 140  136  137   Potassium 3.5 - 5.1 mmol/L 4.5  4.7  3.7   Chloride 98 -  111 mmol/L 106  101  102   CO2 22 - 32 mmol/L 27  26  27    Calcium 8.9 - 10.3 mg/dL 9.6  9.5  8.7     Lab Results  Component Value Date   WBC 7.0 06/12/2023   HGB 14.0 06/12/2023   HCT 46.2 (H) 06/12/2023   MCV 88.0 06/12/2023   PLT 169 06/12/2023   NEUTROABS 5.9 05/26/2022    ASSESSMENT & PLAN:  Malignant neoplasm of upper-outer quadrant of left breast in female, estrogen receptor positive (HCC) 08/24/2021: Screening mammogram revealed left breast asymmetry at 2 o'clock position measuring 1.6 cm.  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  MRI detected 2 additional masses measuring 5.2 cm and 2.4 cm (need biopsied) no lymphadenopathy   Recommendations:  1.  Neoadjuvant antiestrogen therapy with letrozole (until additional biopsies were performed) 2. 07/14/2022: Left lumpectomy: No residual cancer identified.  All margins are negative 3. Oncotype DX: 21 (7% distant recurrence at 9 years) 4.  Radiation omitted because of shoulder issues 5. Adjuvant antiestrogen therapy Letrozole started 01/09/23 ---------------------------------------------------------------------------------------------------------------------------------------- Letrozole toxicities: Mild hot flashes  Patient underwent a left shoulder replacement surgery and she is doing  very well.  She think she might need a right shoulder replacement surgery.  Breast cancer surveillance: Breast exam 08/21/2023: Benign Mammogram will need to be ordered (delayed because of colon surgery and infection as well as a shoulder replacement surgery) Bone density has also been ordered  Return to clinic in 1 year for follow-up  ------------------------------------- Assessment and Plan    Breast Cancer Tolerating Letrozole well with minimal side effects. No new breast pain or discomfort. Delayed mammogram due to shoulder surgery and colon issues. -Schedule mammogram within the next month at the breast center.  Shoulder Arthritis Recent left shoulder replacement in July with improvement in pain. Right shoulder pain worsening. -Continue physical therapy for left shoulder. -Consider evaluation for right shoulder when ready.  Bone Health History of osteopenia. Last bone density scan performed by Dr. Estanislado Pandy, date unknown. -Schedule bone density scan at [facility] building on [address].  Colon Health History of colon mass removed in November/December of the previous year with no cancer found. Pending CT scan of abdomen. -Follow up with Dr. Duwaine Maxin office regarding scheduling of CT scan.  Follow-up in one year if all screenings and scans are normal. Continue Letrozole for a total of 5-7 years.          Orders Placed This Encounter  Procedures   MM DIAG BREAST TOMO BILATERAL    Standing Status:   Future    Standing Expiration Date:   08/20/2024    Order Specific Question:   Reason for Exam (SYMPTOM  OR DIAGNOSIS REQUIRED)    Answer:   Annual mammograms with H/O breast cancer    Order Specific Question:   Preferred imaging location?    Answer:   [facility]    Order Specific Question:   Release to patient    Answer:   Immediate   The patient has a good understanding of the overall plan. she agrees with it. she will call with any problems that may develop before the next  visit here. Total time spent: 30 mins including face to face time and time spent for planning, charting and co-ordination of care   Tamsen Meek, MD 08/21/23

## 2023-08-24 ENCOUNTER — Encounter: Payer: Self-pay | Admitting: Rehabilitative and Restorative Service Providers"

## 2023-08-24 ENCOUNTER — Ambulatory Visit: Payer: Medicare Other | Admitting: Rehabilitative and Restorative Service Providers"

## 2023-08-24 DIAGNOSIS — M5459 Other low back pain: Secondary | ICD-10-CM

## 2023-08-24 DIAGNOSIS — R293 Abnormal posture: Secondary | ICD-10-CM

## 2023-08-24 DIAGNOSIS — M25612 Stiffness of left shoulder, not elsewhere classified: Secondary | ICD-10-CM

## 2023-08-24 DIAGNOSIS — R29898 Other symptoms and signs involving the musculoskeletal system: Secondary | ICD-10-CM

## 2023-08-24 DIAGNOSIS — M6281 Muscle weakness (generalized): Secondary | ICD-10-CM

## 2023-08-24 DIAGNOSIS — G8929 Other chronic pain: Secondary | ICD-10-CM

## 2023-08-24 DIAGNOSIS — M62830 Muscle spasm of back: Secondary | ICD-10-CM

## 2023-08-24 NOTE — Therapy (Signed)
OUTPATIENT PHYSICAL THERAPY TREATMENT   Patient Name: Brooke Duncan MRN: [id number] DOB:[birth year], 77 y.o., female Today's Date: 08/24/2023  END OF SESSION:       Past Medical History:  Diagnosis Date   Acquired solitary kidney    right side 2014   Arthritis    Bronchitis 05/08/2023   Cancer (HCC)    renal cell carcinoma- left kidney removed   Complication of anesthesia    slow to wake, hard to take a deep breath with 1 surgery several yrs ago   Frequency of urination    Headache    Heart murmur    per told by pcp approx. 02/ 2022 heard a very faint murmur, told did need work-up done at this time   History of COVID-19 2021   all symptoms resolved   History of Hashimoto thyroiditis    s/p  total thyroidectomy 1995   History of hypercalcemia    s/p  parathryoidectomy 07/ 2021   History of renal cell carcinoma 2014   s/p  left nephrectomy, pt stated no other treatment   Hypertension    followed by pcp   Hypothyroidism, postsurgical 1995   followed by pcp in [place], moved from [place] 01/ 2021   OSA on CPAP    Thickened endometrium    UTI (urinary tract infection)    finished keflex 08-12-2022   Wears glasses    Past Surgical History:  Procedure Laterality Date   BREAST BIOPSY Left 08/24/2021   BREAST LUMPECTOMY WITH RADIOACTIVE SEED LOCALIZATION Left 07/14/2022   Procedure: LEFT BREAST LUMPECTOMY WITH RADIOACTIVE SEED LOCALIZATION;  Surgeon: Emelia Loron, MD;  Location: [facility];  Service: General;  Laterality: Left;   CYSTOSCOPY  08/04/2022   in dr winter office   CYSTOSCOPY WITH BIOPSY N/A 09/02/2022   Procedure: CYSTOSCOPY WITH BLADDER BIOPSY;  Surgeon: Rene Paci, MD;  Location: [facility];  Service: Urology;  Laterality: N/A;  ONLY NEEDS 30 MIN   HYSTEROSCOPY WITH D & C N/A 04/08/2021   Procedure: DILATATION AND CURETTAGE /HYSTEROSCOPY, EXCISION OF VAGINAL LESION;  Surgeon: Silverio Lay, MD;   Location: [facility];  Service: Gynecology;  Laterality: N/A;   LAPAROSCOPIC CHOLECYSTECTOMY  2005   NEPHRECTOMY Left 2014   PARATHYROIDECTOMY  07/ 2021  in [place]   REVERSE SHOULDER ARTHROPLASTY Left 06/16/2023   Procedure: REVERSE SHOULDER ARTHROPLASTY;  Surgeon: Beverely Low, MD;  Location: [facility];  Service: Orthopedics;  Laterality: Left;   TONSILLECTOMY  age 88   TOTAL HIP ARTHROPLASTY Bilateral left 2015;  right 2009   TOTAL KNEE ARTHROPLASTY Left 2014   TOTAL THYROIDECTOMY Bilateral 1995   WISDOM TOOTH EXTRACTION Bilateral    Patient Active Problem List   Diagnosis Date Noted   H/O total shoulder replacement, left 06/16/2023   Colonic mass 11/03/2022   Malignant neoplasm of upper-outer quadrant of left breast in female, estrogen receptor positive (HCC) 08/27/2021   Abnormal ultrasound of endometrium 04/08/2021    PCP: Dr Daisy Floro   REFERRING PROVIDER: Dr Malon Kindle   REFERRING DIAG: L reverse TSA  THERAPY DIAG:  Other symptoms and signs involving the musculoskeletal system  Muscle weakness (generalized)  Chronic left shoulder pain  Abnormal posture  Stiffness of left shoulder, not elsewhere classified  Other low back pain  Muscle spasm of back  Rationale for Evaluation and Treatment: Rehabilitation  ONSET DATE: surgery 06/16/23  SUBJECTIVE:  SUBJECTIVE STATEMENT:  Doing well with exercises at home. Shoulder is feeling okay. Still having some tightness more than pain in the L mid to lower back. Shoulder is feeling better. Stretches for the back help but then the back just tightens up again.    EVALUATION:Patient reports that she has had L shoulder pain for ~ 5 years with gradual increase in symptoms including pain and weakness L UE. She has difficulty  with functional activities involving L UE. She could not even comb her hair with the L UE. She has difficulty sleeping due to aching L UE. Underwent L reverse TSA 06/16/23 with no complications. She is having some discomfort and stiffness in the L shoulder. She has limited ROM.  Hand dominance: Right  PERTINENT HISTORY: Arthritis; HTN; gall bladder removed; L TKA 2014; L posterior hip pain; L shoulder pain; L reverse TSA;  L breast cancer continues on estrogen inhibitor treatment   PAIN:  Are you having pain? Yes: NPRS scale: 1-2/10 now  Pain location: L shoulder  Pain description: stiffness, some muscle soreness in upper arm Aggravating factors: movement Relieving factors: rest and meds  PRECAUTIONS: Other: reverse TSA protocol   WEIGHT BEARING RESTRICTIONS: Yes no weight bearing L UE   FALLS:  Has patient fallen in last 6 months? No  LIVING ENVIRONMENT: Lives alone Lives in: House/apartment Stairs: No  OCCUPATION: Retired Cytogeneticist work; active with household chores; sewing quilting; reading   PATIENT GOALS:to use L arm for functional activities again   NEXT MD VISIT: 09/13/23  OBJECTIVE:   DIAGNOSTIC FINDINGS:  CT scan 2023: IMPRESSION: 1. Severe left glenohumeral joint degenerative changes as detailed above. 2. Moderate AC joint degenerative changes. 3. Grossly by CT the rotator cuff tendons are intact.  PATIENT SURVEYS:  FOTO not done for shoulder      SENSATION: WFL  POSTURE: Patient presents with head forward posture with increased thoracic kyphosis; shoulders rounded and elevated; scapulae abducted and rotated along the thoracic spine; head of the humerus anterior in orientation.   UPPER EXTREMITY ROM:   Active ROM Right eval Left eval  Shoulder flexion 114 65  Shoulder extension 49 NT  Shoulder abduction 84 67  Shoulder adduction    Shoulder internal rotation Hand to lateral hip NT  Shoulder external rotation 40 elbow at side  19 elbow at side    Elbow flexion    Elbow extension    Wrist flexion    Wrist extension    Wrist ulnar deviation    Wrist radial deviation    Wrist pronation    Wrist supination    (Blank rows = not tested)  UPPER EXTREMITY MMT: not tested   MMT Right eval Left eval  Shoulder flexion    Shoulder extension    Shoulder abduction    Shoulder adduction    Shoulder internal rotation    Shoulder external rotation    Middle trapezius    Lower trapezius    Elbow flexion    Elbow extension    Wrist flexion    Wrist extension    Wrist ulnar deviation    Wrist radial deviation    Wrist pronation    Wrist supination    Grip strength (lbs)    (Blank rows = not tested)  JOINT MOBILITY TESTING:  N/A  PALPATION:  Tightness L shoulder girdle  [facility] Adult PT Treatment:  DATE: 08/24/23  Therapeutic Exercise: Pulley flexion 10 sec x 10 Pulley scaption 10 sec x 10  Horizontal abduction and adduction for gentle ROM x 10  Shoulder isometrics abduction, ER, IR,  extension 10 x 5 sec hold  Shoulder flexion pushing walker out 10 sec x 5 x 2 sets  Row red TB x 10 x 2  Shoulder ext red TB x 10 x 2  Lt shoulder AAROM ER with dowel seated 10 x 5 sec hold Scap squeeze 10 x 10 sec Lt shoulder flexion AROM in supine, long lever to 90 degrees 2 x 5  Sitting lateral side bend 5-10 sec x 5 R/L Sitting trunk rotation 5-10 sec x 5  Trunk rotation supine 5-10 sec x 5 R/L  Shoulder flexion supine long arm against gravity x 10  Punch to ceiling x 10  Manual Therapy: PROM Lt shoulder to tissue tolerance and within limits of protocol  STM Lt shoulder girdle    08/21/23  TherEx  Supine left shoulder flexion AROM x10 0# long lever arm Serratus punches left shoulder 0# x10  Serratus punch + CW circles x10/xCCW circles x10 Seated scap retractions x20 with 3 second holds  Wall ladder for flexion AAROM x8 with 3 second holds at top range  Scap retractions with red TB  x10 x2-3 seconds  Gentle IR glute taps with caution towards range x10   Manual   PROM surgical shoulder as appropriate per protocol   08/17/23 Therapeutic Exercise: Pulley flexion 10 sec x 10 Pulley scaption 10 sec x 10  Horizontal abduction and adduction for gentle ROM x 10  Shoulder isometrics abduction, ER, IR,  extension 5 x 5 sec hold  Row red TB x 10 Shoulder ext red TB x 10 Lt shoulder AAROM ER with dowel seated 10 x 5 sec hold Scap squeeze 10 x 10 sec Lt shoulder flexion AROM in supine, long lever to 90 degrees 2 x 5  Manual Therapy: PROM Lt shoulder to tolerance STM Lt shoulder musculature  08/17/23 Therapeutic Exercise: Pulley flexion and abd for warm up within protocol limits x 2 min Shoulder isometrics abduction, ER, IR,  extension 5 x 5 sec hold  Row red TB x 10 Shoulder ext red TB x 10 Lt shoulder AAROM ER with dowel seated 10 x 5 sec hold Scap squeeze 10 x 10 sec Lt shoulder flexion AROM in supine, long lever to 90 degrees 2 x 5  Manual Therapy: PROM Lt shoulder to tolerance STM Lt shoulder musculature   PATIENT EDUCATION: Education details: HEP  Person educated: Patient Education method: Programmer, multimedia, Facilities manager, Actor cues, Verbal cues, and Handouts Education comprehension: verbalized understanding, returned demonstration, verbal cues required, tactile cues required, and needs further education  HOME EXERCISE PROGRAM:  Access Code: [email or web address] URL: [email or web address] Date: 08/24/2023 Prepared by: Corlis Leak  Exercises - Seated Scapular Retraction  - 2 x daily - 7 x weekly - 1-2 sets - 10 reps - 10 sec  hold - Seated Bilateral Shoulder Flexion Towel Slide at Table Top  - 2 x daily - 7 x weekly - 1 sets - 5-10 reps - 10 sec  hold - Seated Shoulder External Rotation AAROM with Cane and Hand in Neutral  - 2 x daily - 7 x weekly - 1 sets - 5-10 reps - 5-10 sec  hold - Isometric Shoulder Extension at Wall  - 2 x daily - 7 x weekly - 1  sets - 5-10 reps - 5 sec  hold -  Standing Isometric Shoulder Abduction with Doorway - Arm Bent  - 2 x daily - 7 x weekly - 1 sets - 5-10 reps - 5 sec  hold - Standing Isometric Shoulder External Rotation with Doorway  - 2 x daily - 7 x weekly - 1 sets - 5-10 reps - 5 sec  hold - Standing Isometric Shoulder Internal Rotation at Doorway  - 2 x daily - 7 x weekly - 1 sets - 5-10 reps - 5 sec  hold - Seated Shoulder Flexion AAROM with Pulley Behind  - 2 x daily - 7 x weekly - 1 sets - 10 reps - 10 sec  hold - Seated Shoulder Scaption AAROM with Pulley at Side  - 2 x daily - 7 x weekly - 1 sets - 10 reps - 10sec  hold - Standing Bilateral Low Shoulder Row with Anchored Resistance  - 1-2 x daily - 7 x weekly - 1-2 sets - 10 reps - 2-3 sec  hold - Shoulder extension with resistance - Neutral  - 1-2 x daily - 7 x weekly - 1-2 sets - 10 reps - 3-5 sec  hold - Seated Sidebending  - 2 x daily - 7 x weekly - 1 sets - 3-5 reps - 15-20sec  hold - Seated Trunk Rotation - Arms Crossed  - 2 x daily - 7 x weekly - 1 sets - 3-5 reps - 5-10sec  hold - Supine Alternating Shoulder Flexion  - 2 x daily - 7 x weekly - 1-2 sets - 10 reps - 2 sec  hold - Supine Single Arm Shoulder Protraction  - 2 x daily - 7 x weekly - 1 sets - 10-15 reps -   hold - Supine Shoulder Rhythmic Stabilization- Horizontal Abduction/Adduction  - 2 x daily - 7 x weekly - 1 sets - 10-20 reps  ASSESSMENT:  CLINICAL IMPRESSION:  Shoulder continues to improve with less pain and no problem with progressing exercises for shoulder ROM and strength. Added gentle stretches for trunk in sitting and supine. Patient has continued tightness in the L mid to low back.    EVAL: Patient is a 77 y.o. female who was seen today for physical therapy evaluation and treatment for shoulder rehab s/p L reverse TSA 06/16/23. She presents with poor posture and alignment; limited L shoulder ROM, strength, mobility, function; limited ADL's; pain on an intermittent basis.  She will benefit from PT to address problems identified.   OBJECTIVE IMPAIRMENTS: decreased activity tolerance, decreased mobility, decreased ROM, decreased strength, hypomobility, increased fascial restrictions, increased muscle spasms, impaired flexibility, impaired UE functional use, improper body mechanics, postural dysfunction, obesity, and pain.    GOALS: Goals reviewed with patient? Yes  SHORT TERM GOALS: Target date: 09/13/2023   Independent in initial HEP  Baseline: Goal status: INITIAL  2.  Patient to demonstrate improved scapular control with posterior shoulder girdle engaged for exercises  Baseline:  Goal status: INITIAL   LONG TERM GOALS: Target date: 10/25/2023   Improve posture and alignment with patient to demonstrate improved posterior shoulder activation and control for functional movements  Baseline:  Goal status: INITIAL  2.  Increase active AROM L shoulder to ~ equal to AROM R shoulder for flexion; scaption; ER  Baseline:  Goal status: INITIAL  3.  4/5 strength L shoulder  Baseline:  Goal status: INITIAL  4.  Patient reports ability to use L UE for functional activities required for independent living  Baseline:  Goal status: INITIAL  5.  Independent in HEP including aquatic program as indicated  Baseline:  Goal status: INITIAL   PLAN:  PT FREQUENCY: 1-2 times/week  PT DURATION: 12 weeks  PLANNED INTERVENTIONS: Therapeutic exercises, Therapeutic activity, Neuromuscular re-education, Patient/Family education, Self Care, Joint mobilization, Aquatic Therapy, Dry Needling, Electrical stimulation, Spinal mobilization, Cryotherapy, Moist heat, Ultrasound, Ionotophoresis 4mg /ml Dexamethasone, Manual therapy, and Re-evaluation  PLAN FOR NEXT SESSION: review and progress with exercise program per protocol; manual work, DN, modalities as indicated   Arcenio Mullaly P. Leonor Liv PT, MPH 08/24/23 3:26 PM

## 2023-08-24 NOTE — Therapy (Signed)
OUTPATIENT PHYSICAL THERAPY TREATMENT   Patient Name: Brooke Duncan MRN: [id number] DOB:[birth year], 77 y.o., female Today's Date: 08/24/2023  END OF SESSION:  PT End of Session - 08/24/23 1405     Visit Number 6    Number of Visits 24    Date for PT Re-Evaluation 10/25/23    Authorization Type medicare + BCBS supplemental    Authorization - Visit Number 6    Progress Note Due on Visit 10    PT Start Time 1404    Activity Tolerance Patient tolerated treatment well                Past Medical History:  Diagnosis Date   Acquired solitary kidney    right side 2014   Arthritis    Bronchitis 05/08/2023   Cancer (HCC)    renal cell carcinoma- left kidney removed   Complication of anesthesia    slow to wake, hard to take a deep breath with 1 surgery several yrs ago   Frequency of urination    Headache    Heart murmur    per told by pcp approx. 02/ 2022 heard a very faint murmur, told did need work-up done at this time   History of COVID-19 2021   all symptoms resolved   History of Hashimoto thyroiditis    s/p  total thyroidectomy 1995   History of hypercalcemia    s/p  parathryoidectomy 07/ 2021   History of renal cell carcinoma 2014   s/p  left nephrectomy, pt stated no other treatment   Hypertension    followed by pcp   Hypothyroidism, postsurgical 1995   followed by pcp in [place], moved from [place] 01/ 2021   OSA on CPAP    Thickened endometrium    UTI (urinary tract infection)    finished keflex 08-12-2022   Wears glasses    Past Surgical History:  Procedure Laterality Date   BREAST BIOPSY Left 08/24/2021   BREAST LUMPECTOMY WITH RADIOACTIVE SEED LOCALIZATION Left 07/14/2022   Procedure: LEFT BREAST LUMPECTOMY WITH RADIOACTIVE SEED LOCALIZATION;  Surgeon: Emelia Loron, MD;  Location: [facility];  Service: General;  Laterality: Left;   CYSTOSCOPY  08/04/2022   in dr winter office   CYSTOSCOPY WITH BIOPSY N/A 09/02/2022    Procedure: CYSTOSCOPY WITH BLADDER BIOPSY;  Surgeon: Rene Paci, MD;  Location: [facility];  Service: Urology;  Laterality: N/A;  ONLY NEEDS 30 MIN   HYSTEROSCOPY WITH D & C N/A 04/08/2021   Procedure: DILATATION AND CURETTAGE /HYSTEROSCOPY, EXCISION OF VAGINAL LESION;  Surgeon: Silverio Lay, MD;  Location: [facility];  Service: Gynecology;  Laterality: N/A;   LAPAROSCOPIC CHOLECYSTECTOMY  2005   NEPHRECTOMY Left 2014   PARATHYROIDECTOMY  07/ 2021  in [place]   REVERSE SHOULDER ARTHROPLASTY Left 06/16/2023   Procedure: REVERSE SHOULDER ARTHROPLASTY;  Surgeon: Beverely Low, MD;  Location: [facility];  Service: Orthopedics;  Laterality: Left;   TONSILLECTOMY  age 60   TOTAL HIP ARTHROPLASTY Bilateral left 2015;  right 2009   TOTAL KNEE ARTHROPLASTY Left 2014   TOTAL THYROIDECTOMY Bilateral 1995   WISDOM TOOTH EXTRACTION Bilateral    Patient Active Problem List   Diagnosis Date Noted   H/O total shoulder replacement, left 06/16/2023   Colonic mass 11/03/2022   Malignant neoplasm of upper-outer quadrant of left breast in female, estrogen receptor positive (HCC) 08/27/2021   Abnormal ultrasound of endometrium 04/08/2021    PCP: Dr Daisy Floro  REFERRING PROVIDER: Dr Malon Kindle   REFERRING DIAG: L reverse TSA  THERAPY DIAG:  No diagnosis found.  Rationale for Evaluation and Treatment: Rehabilitation  ONSET DATE: surgery 06/16/23  SUBJECTIVE:                                                                                                                                                                                      SUBJECTIVE STATEMENT:  Doing well with exercises at home. Shoulder is feeling okay. Still having some tightness more than pain in the L mid to lower back. Shoulder is feeling better. Stretches for the back help but then the back just tightens up again.    EVALUATION:Patient reports that she has had L shoulder  pain for ~ 5 years with gradual increase in symptoms including pain and weakness L UE. She has difficulty with functional activities involving L UE. She could not even comb her hair with the L UE. She has difficulty sleeping due to aching L UE. Underwent L reverse TSA 06/16/23 with no complications. She is having some discomfort and stiffness in the L shoulder. She has limited ROM.  Hand dominance: Right  PERTINENT HISTORY: Arthritis; HTN; gall bladder removed; L TKA 2014; L posterior hip pain; L shoulder pain; L reverse TSA;  L breast cancer continues on estrogen inhibitor treatment   PAIN:  Are you having pain? Yes: NPRS scale: 1-2/10 now  Pain location: L shoulder  Pain description: stiffness, some muscle soreness in upper arm Aggravating factors: movement Relieving factors: rest and meds  PRECAUTIONS: Other: reverse TSA protocol   WEIGHT BEARING RESTRICTIONS: Yes no weight bearing L UE   FALLS:  Has patient fallen in last 6 months? No  LIVING ENVIRONMENT: Lives alone Lives in: House/apartment Stairs: No  OCCUPATION: Retired Cytogeneticist work; active with household chores; sewing quilting; reading   PATIENT GOALS:to use L arm for functional activities again   NEXT MD VISIT: 09/13/23  OBJECTIVE:   DIAGNOSTIC FINDINGS:  CT scan 2023: IMPRESSION: 1. Severe left glenohumeral joint degenerative changes as detailed above. 2. Moderate AC joint degenerative changes. 3. Grossly by CT the rotator cuff tendons are intact.  PATIENT SURVEYS:  FOTO not done for shoulder      SENSATION: WFL  POSTURE: Patient presents with head forward posture with increased thoracic kyphosis; shoulders rounded and elevated; scapulae abducted and rotated along the thoracic spine; head of the humerus anterior in orientation.   UPPER EXTREMITY ROM:   Active ROM Right eval Left eval  Shoulder flexion 114 65  Shoulder extension 49 NT  Shoulder abduction 84 67  Shoulder adduction     Shoulder internal rotation Hand to  lateral hip NT  Shoulder external rotation 40 elbow at side  19 elbow at side   Elbow flexion    Elbow extension    Wrist flexion    Wrist extension    Wrist ulnar deviation    Wrist radial deviation    Wrist pronation    Wrist supination    (Blank rows = not tested)  UPPER EXTREMITY MMT: not tested   MMT Right eval Left eval  Shoulder flexion    Shoulder extension    Shoulder abduction    Shoulder adduction    Shoulder internal rotation    Shoulder external rotation    Middle trapezius    Lower trapezius    Elbow flexion    Elbow extension    Wrist flexion    Wrist extension    Wrist ulnar deviation    Wrist radial deviation    Wrist pronation    Wrist supination    Grip strength (lbs)    (Blank rows = not tested)  JOINT MOBILITY TESTING:  N/A  PALPATION:  Tightness L shoulder girdle  [facility] Adult PT Treatment:                                                DATE: 08/24/23  Therapeutic Exercise: Pulley flexion 10 sec x 10 Pulley scaption 10 sec x 10  Horizontal abduction and adduction for gentle ROM x 10  Shoulder isometrics abduction, ER, IR,  extension 10 x 5 sec hold  Shoulder flexion pushing walker out 10 sec x 5 x 2 sets  Row red TB x 10 x 2  Shoulder ext red TB x 10 x 2  Lt shoulder AAROM ER with dowel seated 10 x 5 sec hold Scap squeeze 10 x 10 sec Lt shoulder flexion AROM in supine, long lever to 90 degrees 2 x 5  Sitting lateral side bend 5-10 sec x 5 R/L Sitting trunk rotation 5-10 sec x 5  Trunk rotation supine 5-10 sec x 5 R/L  Shoulder flexion supine long arm against gravity x 10  Punch to ceiling x 10  Manual Therapy: PROM Lt shoulder to tissue tolerance and within limits of protocol  STM Lt shoulder girdle    08/21/23  TherEx  Supine left shoulder flexion AROM x10 0# long lever arm Serratus punches left shoulder 0# x10  Serratus punch + CW circles x10/xCCW circles x10 Seated scap retractions x20  with 3 second holds  Wall ladder for flexion AAROM x8 with 3 second holds at top range  Scap retractions with red TB x10 x2-3 seconds  Gentle IR glute taps with caution towards range x10   Manual   PROM surgical shoulder as appropriate per protocol   08/17/23 Therapeutic Exercise: Pulley flexion 10 sec x 10 Pulley scaption 10 sec x 10  Horizontal abduction and adduction for gentle ROM x 10  Shoulder isometrics abduction, ER, IR,  extension 5 x 5 sec hold  Row red TB x 10 Shoulder ext red TB x 10 Lt shoulder AAROM ER with dowel seated 10 x 5 sec hold Scap squeeze 10 x 10 sec Lt shoulder flexion AROM in supine, long lever to 90 degrees 2 x 5  Manual Therapy: PROM Lt shoulder to tolerance STM Lt shoulder musculature  08/17/23 Therapeutic Exercise: Pulley flexion and abd for warm up within protocol limits x  2 min Shoulder isometrics abduction, ER, IR,  extension 5 x 5 sec hold  Row red TB x 10 Shoulder ext red TB x 10 Lt shoulder AAROM ER with dowel seated 10 x 5 sec hold Scap squeeze 10 x 10 sec Lt shoulder flexion AROM in supine, long lever to 90 degrees 2 x 5  Manual Therapy: PROM Lt shoulder to tolerance STM Lt shoulder musculature   PATIENT EDUCATION: Education details: HEP  Person educated: Patient Education method: Programmer, multimedia, Facilities manager, Actor cues, Verbal cues, and Handouts Education comprehension: verbalized understanding, returned demonstration, verbal cues required, tactile cues required, and needs further education  HOME EXERCISE PROGRAM:  Access Code: [email or web address] URL: [email or web address] Date: 08/24/2023 Prepared by: Corlis Leak  Exercises - Seated Scapular Retraction  - 2 x daily - 7 x weekly - 1-2 sets - 10 reps - 10 sec  hold - Seated Bilateral Shoulder Flexion Towel Slide at Table Top  - 2 x daily - 7 x weekly - 1 sets - 5-10 reps - 10 sec  hold - Seated Shoulder External Rotation AAROM with Cane and Hand in Neutral  - 2 x daily - 7  x weekly - 1 sets - 5-10 reps - 5-10 sec  hold - Isometric Shoulder Extension at Wall  - 2 x daily - 7 x weekly - 1 sets - 5-10 reps - 5 sec  hold - Standing Isometric Shoulder Abduction with Doorway - Arm Bent  - 2 x daily - 7 x weekly - 1 sets - 5-10 reps - 5 sec  hold - Standing Isometric Shoulder External Rotation with Doorway  - 2 x daily - 7 x weekly - 1 sets - 5-10 reps - 5 sec  hold - Standing Isometric Shoulder Internal Rotation at Doorway  - 2 x daily - 7 x weekly - 1 sets - 5-10 reps - 5 sec  hold - Seated Shoulder Flexion AAROM with Pulley Behind  - 2 x daily - 7 x weekly - 1 sets - 10 reps - 10 sec  hold - Seated Shoulder Scaption AAROM with Pulley at Side  - 2 x daily - 7 x weekly - 1 sets - 10 reps - 10sec  hold - Standing Bilateral Low Shoulder Row with Anchored Resistance  - 1-2 x daily - 7 x weekly - 1-2 sets - 10 reps - 2-3 sec  hold - Shoulder extension with resistance - Neutral  - 1-2 x daily - 7 x weekly - 1-2 sets - 10 reps - 3-5 sec  hold - Seated Sidebending  - 2 x daily - 7 x weekly - 1 sets - 3-5 reps - 15-20sec  hold - Seated Trunk Rotation - Arms Crossed  - 2 x daily - 7 x weekly - 1 sets - 3-5 reps - 5-10sec  hold - Supine Alternating Shoulder Flexion  - 2 x daily - 7 x weekly - 1-2 sets - 10 reps - 2 sec  hold - Supine Single Arm Shoulder Protraction  - 2 x daily - 7 x weekly - 1 sets - 10-15 reps -   hold - Supine Shoulder Rhythmic Stabilization- Horizontal Abduction/Adduction  - 2 x daily - 7 x weekly - 1 sets - 10-20 reps  ASSESSMENT:  CLINICAL IMPRESSION:  Shoulder continues to improve with less pain and no problem with progressing exercises for shoulder ROM and strength. Added gentle stretches for trunk in sitting and supine. Patient has continued tightness in the L mid to low back.  EVAL: Patient is a 77 y.o. female who was seen today for physical therapy evaluation and treatment for shoulder rehab s/p L reverse TSA 06/16/23. She presents with poor posture  and alignment; limited L shoulder ROM, strength, mobility, function; limited ADL's; pain on an intermittent basis. She will benefit from PT to address problems identified.   OBJECTIVE IMPAIRMENTS: decreased activity tolerance, decreased mobility, decreased ROM, decreased strength, hypomobility, increased fascial restrictions, increased muscle spasms, impaired flexibility, impaired UE functional use, improper body mechanics, postural dysfunction, obesity, and pain.    GOALS: Goals reviewed with patient? Yes  SHORT TERM GOALS: Target date: 09/13/2023   Independent in initial HEP  Baseline: Goal status: INITIAL  2.  Patient to demonstrate improved scapular control with posterior shoulder girdle engaged for exercises  Baseline:  Goal status: INITIAL   LONG TERM GOALS: Target date: 10/25/2023   Improve posture and alignment with patient to demonstrate improved posterior shoulder activation and control for functional movements  Baseline:  Goal status: INITIAL  2.  Increase active AROM L shoulder to ~ equal to AROM R shoulder for flexion; scaption; ER  Baseline:  Goal status: INITIAL  3.  4/5 strength L shoulder  Baseline:  Goal status: INITIAL  4.  Patient reports ability to use L UE for functional activities required for independent living  Baseline:  Goal status: INITIAL  5.  Independent in HEP including aquatic program as indicated  Baseline:  Goal status: INITIAL   PLAN:  PT FREQUENCY: 1-2 times/week  PT DURATION: 12 weeks  PLANNED INTERVENTIONS: Therapeutic exercises, Therapeutic activity, Neuromuscular re-education, Patient/Family education, Self Care, Joint mobilization, Aquatic Therapy, Dry Needling, Electrical stimulation, Spinal mobilization, Cryotherapy, Moist heat, Ultrasound, Ionotophoresis 4mg /ml Dexamethasone, Manual therapy, and Re-evaluation  PLAN FOR NEXT SESSION: review and progress with exercise program per protocol; manual work, DN, modalities as  indicated   Nachum Derossett P. Leonor Liv PT, MPH 08/24/23 2:10 PM

## 2023-08-28 ENCOUNTER — Ambulatory Visit: Payer: Medicare Other | Admitting: Rehabilitative and Restorative Service Providers"

## 2023-08-28 ENCOUNTER — Encounter: Payer: Self-pay | Admitting: Rehabilitative and Restorative Service Providers"

## 2023-08-28 DIAGNOSIS — R29898 Other symptoms and signs involving the musculoskeletal system: Secondary | ICD-10-CM

## 2023-08-28 DIAGNOSIS — M6281 Muscle weakness (generalized): Secondary | ICD-10-CM

## 2023-08-28 DIAGNOSIS — R293 Abnormal posture: Secondary | ICD-10-CM

## 2023-08-28 DIAGNOSIS — M25612 Stiffness of left shoulder, not elsewhere classified: Secondary | ICD-10-CM

## 2023-08-28 DIAGNOSIS — G8929 Other chronic pain: Secondary | ICD-10-CM

## 2023-08-28 DIAGNOSIS — M5459 Other low back pain: Secondary | ICD-10-CM

## 2023-08-28 DIAGNOSIS — M62830 Muscle spasm of back: Secondary | ICD-10-CM

## 2023-08-28 NOTE — Therapy (Signed)
OUTPATIENT PHYSICAL THERAPY TREATMENT   Patient Name: Brooke Duncan MRN: [id number] DOB:1946/09/30, 77 y.o., female Today's Date: 08/28/2023  END OF SESSION:  PT End of Session - 08/28/23 1105     Visit Number 7    Number of Visits 24    Date for PT Re-Evaluation 10/25/23    Authorization - Visit Number 7    Progress Note Due on Visit 10    PT Start Time 1102                Past Medical History:  Diagnosis Date   Acquired solitary kidney    right side 2014   Arthritis    Bronchitis 05/08/2023   Cancer (HCC)    renal cell carcinoma- left kidney removed   Complication of anesthesia    slow to wake, hard to take a deep breath with 1 surgery several yrs ago   Frequency of urination    Headache    Heart murmur    per told by pcp approx. 02/ 2022 heard a very faint murmur, told did need work-up done at this time   History of COVID-19 2021   all symptoms resolved   History of Hashimoto thyroiditis    s/p  total thyroidectomy 1995   History of hypercalcemia    s/p  parathryoidectomy 07/ 2021   History of renal cell carcinoma 2014   s/p  left nephrectomy, pt stated no other treatment   Hypertension    followed by pcp   Hypothyroidism, postsurgical 1995   followed by pcp in [place], moved from [place] 01/ 2021   OSA on CPAP    Thickened endometrium    UTI (urinary tract infection)    finished keflex 08-12-2022   Wears glasses    Past Surgical History:  Procedure Laterality Date   BREAST BIOPSY Left 08/24/2021   BREAST LUMPECTOMY WITH RADIOACTIVE SEED LOCALIZATION Left 07/14/2022   Procedure: LEFT BREAST LUMPECTOMY WITH RADIOACTIVE SEED LOCALIZATION;  Surgeon: Emelia Loron, MD;  Location: [facility];  Service: General;  Laterality: Left;   CYSTOSCOPY  08/04/2022   in dr winter office   CYSTOSCOPY WITH BIOPSY N/A 09/02/2022   Procedure: CYSTOSCOPY WITH BLADDER BIOPSY;  Surgeon: Rene Paci, MD;  Location: [facility];  Service: Urology;  Laterality: N/A;  ONLY NEEDS 30 MIN   HYSTEROSCOPY WITH D & C N/A 04/08/2021   Procedure: DILATATION AND CURETTAGE /HYSTEROSCOPY, EXCISION OF VAGINAL LESION;  Surgeon: Silverio Lay, MD;  Location: [facility];  Service: Gynecology;  Laterality: N/A;   LAPAROSCOPIC CHOLECYSTECTOMY  2005   NEPHRECTOMY Left 2014   PARATHYROIDECTOMY  07/ 2021  in [place]   REVERSE SHOULDER ARTHROPLASTY Left 06/16/2023   Procedure: REVERSE SHOULDER ARTHROPLASTY;  Surgeon: Beverely Low, MD;  Location: [facility];  Service: Orthopedics;  Laterality: Left;   TONSILLECTOMY  age 30   TOTAL HIP ARTHROPLASTY Bilateral left 2015;  right 2009   TOTAL KNEE ARTHROPLASTY Left 2014   TOTAL THYROIDECTOMY Bilateral 1995   WISDOM TOOTH EXTRACTION Bilateral    Patient Active Problem List   Diagnosis Date Noted   H/O total shoulder replacement, left 06/16/2023   Colonic mass 11/03/2022   Malignant neoplasm of upper-outer quadrant of left breast in female, estrogen receptor positive (HCC) 08/27/2021   Abnormal ultrasound of endometrium 04/08/2021    PCP: Dr Daisy Floro   REFERRING PROVIDER: Dr Malon Kindle   REFERRING DIAG: L reverse TSA  THERAPY DIAG:  Other  symptoms and signs involving the musculoskeletal system  Muscle weakness (generalized)  Chronic left shoulder pain  Abnormal posture  Stiffness of left shoulder, not elsewhere classified  Other low back pain  Muscle spasm of back  Rationale for Evaluation and Treatment: Rehabilitation  ONSET DATE: surgery 06/16/23  SUBJECTIVE:                                                                                                                                                                                      SUBJECTIVE STATEMENT: 10 weeks 3 days post surgery  Drove herself to therapy today. She was able to get her walker in and out of the back of the trunk . This was the first time she has driven since  before surgery. Doing well with exercises at home. Shoulder is feeling okay. Still having some tightness more than pain in the L mid to lower back. Shoulder is feeling better. Stretches for the back help but then the back it does not tighten as much between stretches. She is trying to stretch before it tightens up.    EVALUATION:Patient reports that she has had L shoulder pain for ~ 5 years with gradual increase in symptoms including pain and weakness L UE. She has difficulty with functional activities involving L UE. She could not even comb her hair with the L UE. She has difficulty sleeping due to aching L UE. Underwent L reverse TSA 06/16/23 with no complications. She is having some discomfort and stiffness in the L shoulder. She has limited ROM.  Hand dominance: Right  PERTINENT HISTORY: Arthritis; HTN; gall bladder removed; L TKA 2014; L posterior hip pain; L shoulder pain; L reverse TSA;  L breast cancer continues on estrogen inhibitor treatment   PAIN:  Are you having pain? Yes: NPRS scale: 2/10 now  Pain location: L shoulder  Pain description: stiffness, some muscle soreness in upper arm Aggravating factors: movement Relieving factors: rest and meds  PRECAUTIONS: Other: reverse TSA protocol   WEIGHT BEARING RESTRICTIONS: Yes no weight bearing L UE   FALLS:  Has patient fallen in last 6 months? No  LIVING ENVIRONMENT: Lives alone Lives in: House/apartment Stairs: No  OCCUPATION: Retired Cytogeneticist work; active with household chores; sewing quilting; reading   PATIENT GOALS:to use L arm for functional activities again   NEXT MD VISIT: 09/13/23  OBJECTIVE:   DIAGNOSTIC FINDINGS:  CT scan 2023: IMPRESSION: 1. Severe left glenohumeral joint degenerative changes as detailed above. 2. Moderate AC joint degenerative changes. 3. Grossly by CT the rotator cuff tendons are intact.  PATIENT SURVEYS:  FOTO not done for shoulder      SENSATION: [facility]  POSTURE: Patient  presents with head forward posture with increased thoracic kyphosis; shoulders rounded and elevated; scapulae abducted and rotated along the thoracic spine; head of the humerus anterior in orientation.   UPPER EXTREMITY ROM:   Active ROM Right eval Left eval  Shoulder flexion 114 65  Shoulder extension 49 NT  Shoulder abduction 84 67  Shoulder adduction    Shoulder internal rotation Hand to lateral hip NT  Shoulder external rotation 40 elbow at side  19 elbow at side   Elbow flexion    Elbow extension    Wrist flexion    Wrist extension    Wrist ulnar deviation    Wrist radial deviation    Wrist pronation    Wrist supination    (Blank rows = not tested)  UPPER EXTREMITY MMT: not tested   MMT Right eval Left eval  Shoulder flexion    Shoulder extension    Shoulder abduction    Shoulder adduction    Shoulder internal rotation    Shoulder external rotation    Middle trapezius    Lower trapezius    Elbow flexion    Elbow extension    Wrist flexion    Wrist extension    Wrist ulnar deviation    Wrist radial deviation    Wrist pronation    Wrist supination    Grip strength (lbs)    (Blank rows = not tested)  JOINT MOBILITY TESTING:  N/A  PALPATION:  Tightness L shoulder girdle   08/28/23: tightness and tenderness to palpation through L shoulder girdle in pecs, upper trap, biceps, L lateral scapular area, mid to low back  [facility] Adult PT Treatment:                                                DATE: 08/28/23  Therapeutic Exercise: Sitting  Pulley flexion 10 sec x 10 Pulley scaption 10 sec x 10  Horizontal abduction and adduction for gentle ROM x 10  Shoulder flexion pushing walker out 10 sec x 5 x 2 sets Scap squeeze 10 x 10 sec Lt shoulder AAROM ER with dowel seated 10 x 5 sec hold Sitting lateral side bend 5-10 sec x 5 R/L Sitting trunk rotation 5-10 sec x 5  Scap squeeze with ER red TB 3 sec x 10    Standing  Row green TB x 10 x 2  Shoulder ext green TB  x 10 x 2 (standing closer)  Shoulder isometrics abduction, ER, IR,  extension 10 x 5 sec hold Active shoulder flexion 3 sec x 10  Active scaption 3 sec x 10  Supine  Scap squeeze 10 sec x 10   Shoulder flexion supine long arm against gravity ~ 110 deg x 10 to patient limit  Punch to ceiling x 10  Rhythmic stabilization  Trunk rotation 5-10 sec x 5 R/L; repeated with bilat UE resting at ~ 60 deg abduction at sides  Manual Therapy: PROM Lt shoulder to tissue tolerance and within limits of protocol  STM Lt shoulder girdle  Gentle neural mobilization with PT assist patient in supine L UE in ~ 60 degree abduction UE resting on table    [facility] Adult PT Treatment:  DATE: 08/24/23  Therapeutic Exercise: Pulley flexion 10 sec x 10 Pulley scaption 10 sec x 10  Horizontal abduction and adduction for gentle ROM x 10  Shoulder isometrics abduction, ER, IR,  extension 10 x 5 sec hold  Shoulder flexion pushing walker out 10 sec x 5 x 2 sets  Row red TB x 10 x 2  Shoulder ext red TB x 10 x 2  Lt shoulder AAROM ER with dowel seated 10 x 5 sec hold Scap squeeze 10 x 10 sec Lt shoulder flexion AROM in supine, long lever to 90 degrees 2 x 5  Sitting lateral side bend 5-10 sec x 5 R/L Sitting trunk rotation 5-10 sec x 5  Trunk rotation supine 5-10 sec x 5 R/L  Shoulder flexion supine long arm against gravity x 10  Punch to ceiling x 10  Manual Therapy: PROM Lt shoulder to tissue tolerance and within limits of protocol  STM Lt shoulder girdle    PATIENT EDUCATION: Education details: HEP  Person educated: Patient Education method: Programmer, multimedia, Facilities manager, Actor cues, Verbal cues, and Handouts Education comprehension: verbalized understanding, returned demonstration, verbal cues required, tactile cues required, and needs further education  HOME EXERCISE PROGRAM: Access Code: [email or web address] URL: [email or web address] Date:  08/28/2023 Prepared by: Corlis Leak  Exercises - Seated Scapular Retraction  - 2 x daily - 7 x weekly - 1-2 sets - 10 reps - 10 sec  hold - Seated Bilateral Shoulder Flexion Towel Slide at Table Top  - 2 x daily - 7 x weekly - 1 sets - 5-10 reps - 10 sec  hold - Seated Shoulder External Rotation AAROM with Cane and Hand in Neutral  - 2 x daily - 7 x weekly - 1 sets - 5-10 reps - 5-10 sec  hold - Isometric Shoulder Extension at Wall  - 2 x daily - 7 x weekly - 1 sets - 5-10 reps - 5 sec  hold - Standing Isometric Shoulder Abduction with Doorway - Arm Bent  - 2 x daily - 7 x weekly - 1 sets - 5-10 reps - 5 sec  hold - Standing Isometric Shoulder External Rotation with Doorway  - 2 x daily - 7 x weekly - 1 sets - 5-10 reps - 5 sec  hold - Standing Isometric Shoulder Internal Rotation at Doorway  - 2 x daily - 7 x weekly - 1 sets - 5-10 reps - 5 sec  hold - Seated Shoulder Flexion AAROM with Pulley Behind  - 2 x daily - 7 x weekly - 1 sets - 10 reps - 10 sec  hold - Seated Shoulder Scaption AAROM with Pulley at Side  - 2 x daily - 7 x weekly - 1 sets - 10 reps - 10sec  hold - Standing Bilateral Low Shoulder Row with Anchored Resistance  - 1-2 x daily - 7 x weekly - 1-2 sets - 10 reps - 2-3 sec  hold - Shoulder extension with resistance - Neutral  - 1-2 x daily - 7 x weekly - 1-2 sets - 10 reps - 3-5 sec  hold - Seated Sidebending  - 2 x daily - 7 x weekly - 1 sets - 3-5 reps - 15-20sec  hold - Seated Trunk Rotation - Arms Crossed  - 2 x daily - 7 x weekly - 1 sets - 3-5 reps - 5-10sec  hold - Supine Alternating Shoulder Flexion  - 2 x daily - 7 x weekly - 1-2 sets - 10 reps - 2  sec  hold - Supine Single Arm Shoulder Protraction  - 2 x daily - 7 x weekly - 1 sets - 10-15 reps -   hold - Supine Shoulder Rhythmic Stabilization- Horizontal Abduction/Adduction  - 2 x daily - 7 x weekly - 1 sets - 10-20 reps - Shoulder External Rotation and Scapular Retraction with Resistance  - 2 x daily - 7 x weekly - 1  sets - 10 reps - 3-5 sec  hold - Standing Shoulder Flexion to 90 Degrees  - 2 x daily - 7 x weekly - 1 sets - 10 reps - 3 sec  hold - Standing Shoulder Scaption  - 2 x daily - 7 x weekly - 1 sets - 10 reps - 3 sec  hold - Supine Shoulder Rhythmic Stabilization- Horizontal Abduction/Adduction  - 2 x daily - 7 x weekly - 1 sets - 5-10 reps  ASSESSMENT:  CLINICAL IMPRESSION:  Patient is progressing well. She drove herself to therapy today and was able to get her walker in and out of the car trunk by herself. Shoulder continues to improve with less pain and no problem with progressing exercises for shoulder ROM and strength. Continued with gentle stretches for trunk in sitting and supine. Patient has continued tightness  and tenderness to palpation through L lateral scapular area, mid to low back.    EVAL: Patient is a 77 y.o. female who was seen today for physical therapy evaluation and treatment for shoulder rehab s/p L reverse TSA 06/16/23. She presents with poor posture and alignment; limited L shoulder ROM, strength, mobility, function; limited ADL's; pain on an intermittent basis. She will benefit from PT to address problems identified.   OBJECTIVE IMPAIRMENTS: decreased activity tolerance, decreased mobility, decreased ROM, decreased strength, hypomobility, increased fascial restrictions, increased muscle spasms, impaired flexibility, impaired UE functional use, improper body mechanics, postural dysfunction, obesity, and pain.    GOALS: Goals reviewed with patient? Yes  SHORT TERM GOALS: Target date: 09/13/2023   Independent in initial HEP  Baseline: Goal status: INITIAL  2.  Patient to demonstrate improved scapular control with posterior shoulder girdle engaged for exercises  Baseline:  Goal status: INITIAL   LONG TERM GOALS: Target date: 10/25/2023   Improve posture and alignment with patient to demonstrate improved posterior shoulder activation and control for functional  movements  Baseline:  Goal status: INITIAL  2.  Increase active AROM L shoulder to ~ equal to AROM R shoulder for flexion; scaption; ER  Baseline:  Goal status: INITIAL  3.  4/5 strength L shoulder  Baseline:  Goal status: INITIAL  4.  Patient reports ability to use L UE for functional activities required for independent living  Baseline:  Goal status: INITIAL  5.  Independent in HEP including aquatic program as indicated  Baseline:  Goal status: INITIAL   PLAN:  PT FREQUENCY: 1-2 times/week  PT DURATION: 12 weeks  PLANNED INTERVENTIONS: Therapeutic exercises, Therapeutic activity, Neuromuscular re-education, Patient/Family education, Self Care, Joint mobilization, Aquatic Therapy, Dry Needling, Electrical stimulation, Spinal mobilization, Cryotherapy, Moist heat, Ultrasound, Ionotophoresis 4mg /ml Dexamethasone, Manual therapy, and Re-evaluation  PLAN FOR NEXT SESSION: review and progress with exercise program per protocol; manual work, DN, modalities as indicated   Keeana Pieratt P. Leonor Liv PT, MPH 08/28/23 11:07 AM

## 2023-08-31 ENCOUNTER — Ambulatory Visit: Payer: Medicare Other | Admitting: Rehabilitative and Restorative Service Providers"

## 2023-08-31 ENCOUNTER — Encounter: Payer: Self-pay | Admitting: Rehabilitative and Restorative Service Providers"

## 2023-08-31 DIAGNOSIS — M6281 Muscle weakness (generalized): Secondary | ICD-10-CM

## 2023-08-31 DIAGNOSIS — M25612 Stiffness of left shoulder, not elsewhere classified: Secondary | ICD-10-CM

## 2023-08-31 DIAGNOSIS — G8929 Other chronic pain: Secondary | ICD-10-CM

## 2023-08-31 DIAGNOSIS — M5459 Other low back pain: Secondary | ICD-10-CM | POA: Diagnosis not present

## 2023-08-31 DIAGNOSIS — R29898 Other symptoms and signs involving the musculoskeletal system: Secondary | ICD-10-CM

## 2023-08-31 DIAGNOSIS — R293 Abnormal posture: Secondary | ICD-10-CM

## 2023-08-31 NOTE — Therapy (Signed)
OUTPATIENT PHYSICAL THERAPY TREATMENT   Patient Name: Brooke Duncan MRN: [id number] DOB:02/25/1946, 77 y.o., female Today's Date: 08/31/2023  END OF SESSION:  PT End of Session - 08/31/23 1153     Visit Number 8    Number of Visits 24    Date for PT Re-Evaluation 10/25/23    Authorization Type medicare + BCBS supplemental    Authorization - Visit Number 8    Progress Note Due on Visit 10    PT Start Time 1145    PT Stop Time 1235    PT Time Calculation (min) 50 min    Activity Tolerance Patient tolerated treatment well                Past Medical History:  Diagnosis Date   Acquired solitary kidney    right side 2014   Arthritis    Bronchitis 05/08/2023   Cancer (HCC)    renal cell carcinoma- left kidney removed   Complication of anesthesia    slow to wake, hard to take a deep breath with 1 surgery several yrs ago   Frequency of urination    Headache    Heart murmur    per told by pcp approx. 02/ 2022 heard a very faint murmur, told did need work-up done at this time   History of COVID-19 2021   all symptoms resolved   History of Hashimoto thyroiditis    s/p  total thyroidectomy 1995   History of hypercalcemia    s/p  parathryoidectomy 07/ 2021   History of renal cell carcinoma 2014   s/p  left nephrectomy, pt stated no other treatment   Hypertension    followed by pcp   Hypothyroidism, postsurgical 1995   followed by pcp in [place], moved from [place] 01/ 2021   OSA on CPAP    Thickened endometrium    UTI (urinary tract infection)    finished keflex 08-12-2022   Wears glasses    Past Surgical History:  Procedure Laterality Date   BREAST BIOPSY Left 08/24/2021   BREAST LUMPECTOMY WITH RADIOACTIVE SEED LOCALIZATION Left 07/14/2022   Procedure: LEFT BREAST LUMPECTOMY WITH RADIOACTIVE SEED LOCALIZATION;  Surgeon: Emelia Loron, MD;  Location: [facility];  Service: General;  Laterality: Left;   CYSTOSCOPY  08/04/2022   in dr winter  office   CYSTOSCOPY WITH BIOPSY N/A 09/02/2022   Procedure: CYSTOSCOPY WITH BLADDER BIOPSY;  Surgeon: Rene Paci, MD;  Location: [facility];  Service: Urology;  Laterality: N/A;  ONLY NEEDS 30 MIN   HYSTEROSCOPY WITH D & C N/A 04/08/2021   Procedure: DILATATION AND CURETTAGE /HYSTEROSCOPY, EXCISION OF VAGINAL LESION;  Surgeon: Silverio Lay, MD;  Location: [facility];  Service: Gynecology;  Laterality: N/A;   LAPAROSCOPIC CHOLECYSTECTOMY  2005   NEPHRECTOMY Left 2014   PARATHYROIDECTOMY  07/ 2021  in [place]   REVERSE SHOULDER ARTHROPLASTY Left 06/16/2023   Procedure: REVERSE SHOULDER ARTHROPLASTY;  Surgeon: Beverely Low, MD;  Location: [facility];  Service: Orthopedics;  Laterality: Left;   TONSILLECTOMY  age 5   TOTAL HIP ARTHROPLASTY Bilateral left 2015;  right 2009   TOTAL KNEE ARTHROPLASTY Left 2014   TOTAL THYROIDECTOMY Bilateral 1995   WISDOM TOOTH EXTRACTION Bilateral    Patient Active Problem List   Diagnosis Date Noted   H/O total shoulder replacement, left 06/16/2023   Colonic mass 11/03/2022   Malignant neoplasm of upper-outer quadrant of left breast in female, estrogen receptor positive (HCC) 08/27/2021  Abnormal ultrasound of endometrium 04/08/2021    PCP: Dr Daisy Floro   REFERRING PROVIDER: Dr Malon Kindle   REFERRING DIAG: L reverse TSA  THERAPY DIAG:  Other symptoms and signs involving the musculoskeletal system  Muscle weakness (generalized)  Chronic left shoulder pain  Abnormal posture  Stiffness of left shoulder, not elsewhere classified  Rationale for Evaluation and Treatment: Rehabilitation  ONSET DATE: surgery 06/16/23  SUBJECTIVE:                                                                                                                                                                                      SUBJECTIVE STATEMENT: 10 weeks 3 days post surgery  Brooke Duncan reports that she is driving  now and doing. She was able to get her walker in and out of the back of the trunk. Doing well with exercises at home. Shoulder is feeling okay. Still having some tightness more than pain in the L mid to lower back. Shoulder is feeling better. Stretches for the back help but then the back it does not tighten as much between stretches. She is trying to stretch before it tightens up.    EVALUATION:Patient reports that she has had L shoulder pain for ~ 5 years with gradual increase in symptoms including pain and weakness L UE. She has difficulty with functional activities involving L UE. She could not even comb her hair with the L UE. She has difficulty sleeping due to aching L UE. Underwent L reverse TSA 06/16/23 with no complications. She is having some discomfort and stiffness in the L shoulder. She has limited ROM.  Hand dominance: Right  PERTINENT HISTORY: Arthritis; HTN; gall bladder removed; L TKA 2014; L posterior hip pain; L shoulder pain; L reverse TSA;  L breast cancer continues on estrogen inhibitor treatment   PAIN:  Are you having pain? Yes: NPRS scale: 0-2/10 now  Pain location: L shoulder  Pain description: stiffness, some muscle soreness in upper arm Aggravating factors: movement Relieving factors: rest and meds  PRECAUTIONS: Other: reverse TSA protocol   WEIGHT BEARING RESTRICTIONS: Yes no weight bearing L UE   FALLS:  Has patient fallen in last 6 months? No  LIVING ENVIRONMENT: Lives alone Lives in: House/apartment Stairs: No  OCCUPATION: Retired Cytogeneticist work; active with household chores; sewing quilting; reading   PATIENT GOALS:to use L arm for functional activities again   NEXT MD VISIT: 09/13/23  OBJECTIVE:   DIAGNOSTIC FINDINGS:  CT scan 2023: IMPRESSION: 1. Severe left glenohumeral joint degenerative changes as detailed above. 2. Moderate AC joint degenerative changes. 3. Grossly by CT the rotator cuff tendons are intact.  PATIENT SURVEYS:  FOTO  not done for shoulder      SENSATION: WFL  POSTURE: Patient presents with head forward posture with increased thoracic kyphosis; shoulders rounded and elevated; scapulae abducted and rotated along the thoracic spine; head of the humerus anterior in orientation.   UPPER EXTREMITY ROM:   Active ROM Right eval Left eval  Shoulder flexion 114 65  Shoulder extension 49 NT  Shoulder abduction 84 67  Shoulder adduction    Shoulder internal rotation Hand to lateral hip NT  Shoulder external rotation 40 elbow at side  19 elbow at side   Elbow flexion    Elbow extension    Wrist flexion    Wrist extension    Wrist ulnar deviation    Wrist radial deviation    Wrist pronation    Wrist supination    (Blank rows = not tested)  UPPER EXTREMITY MMT: not tested   MMT Right eval Left eval  Shoulder flexion    Shoulder extension    Shoulder abduction    Shoulder adduction    Shoulder internal rotation    Shoulder external rotation    Middle trapezius    Lower trapezius    Elbow flexion    Elbow extension    Wrist flexion    Wrist extension    Wrist ulnar deviation    Wrist radial deviation    Wrist pronation    Wrist supination    Grip strength (lbs)    (Blank rows = not tested)  JOINT MOBILITY TESTING:  N/A  PALPATION:  Tightness L shoulder girdle   08/28/23: tightness and tenderness to palpation through L shoulder girdle in pecs, upper trap, biceps, L lateral scapular area, mid to low back  [facility] Adult PT Treatment:                                                DATE: 08/31/23  Therapeutic Exercise: Sitting  Pulley flexion 10 sec x 10 Pulley scaption 10 sec x 10  Horizontal abduction and adduction for gentle ROM x 10  Shoulder flexion pushing walker out 10 sec x 5 x 2 sets Scap squeeze 10 x 3 sec Lt shoulder AAROM ER with dowel seated 10 x 5 sec hold Sitting lateral side bend 5-10 sec x 5 R/L Sitting trunk rotation 5-10 sec x 5  Scap squeeze with ER red TB 3 sec  x 10    Standing  Row green TB x 10 x 2  Shoulder ext green TB x 10 x 2 (standing closer)  L Shoulder IR red TB 3 sec x 10 x 2 L Shoulder ER red TB 3 sec x 10 x 2  Active shoulder flexion 3 sec x 10 x 2  Active scaption 3 sec x 10 x 2  Scap squeeze 3 sec x 16 Bilat ER red TB 3 sec x 10 x 2  Supine  Scap squeeze 10 sec x 10   Shoulder flexion supine long arm against gravity ~ 110 deg x 10 to patient limit  Punch to ceiling x 10  Rhythmic stabilization  Trunk rotation 5-10 sec x 5 R/L; repeated with bilat UE resting at ~ 60 deg abduction at sides  Manual Therapy: PROM Lt shoulder to tissue tolerance and within limits of protocol - pt supine STM Lt shoulder girdle -pt sitting  [facility] Adult PT Treatment:  DATE: 08/28/23  Therapeutic Exercise: Sitting  Pulley flexion 10 sec x 10 Pulley scaption 10 sec x 10  Horizontal abduction and adduction for gentle ROM x 10  Shoulder flexion pushing walker out 10 sec x 5 x 2 sets Scap squeeze 10 x 10 sec Lt shoulder AAROM ER with dowel seated 10 x 5 sec hold Sitting lateral side bend 5-10 sec x 5 R/L Sitting trunk rotation 5-10 sec x 5  Scap squeeze with ER red TB 3 sec x 10    Standing  Row green TB x 10 x 2  Shoulder ext green TB x 10 x 2 (standing closer)  Shoulder isometrics abduction, ER, IR,  extension 10 x 5 sec hold Active shoulder flexion 3 sec x 10  Active scaption 3 sec x 10  Supine  Scap squeeze 10 sec x 10   Shoulder flexion supine long arm against gravity ~ 110 deg x 10 to patient limit  Punch to ceiling x 10  Rhythmic stabilization  Trunk rotation 5-10 sec x 5 R/L; repeated with bilat UE resting at ~ 60 deg abduction at sides  Manual Therapy: PROM Lt shoulder to tissue tolerance and within limits of protocol  STM Lt shoulder girdle  Gentle neural mobilization with PT assist patient in supine L UE in ~ 60 degree abduction UE resting on table    PATIENT EDUCATION: Education  details: HEP  Person educated: Patient Education method: Programmer, multimedia, Demonstration, Tactile cues, Verbal cues, and Handouts Education comprehension: verbalized understanding, returned demonstration, verbal cues required, tactile cues required, and needs further education  HOME EXERCISE PROGRAM: Access Code: [email or web address] URL: [email or web address] Date: 08/31/2023 Prepared by: Corlis Leak  Exercises - Seated Scapular Retraction  - 2 x daily - 7 x weekly - 1-2 sets - 10 reps - 10 sec  hold - Seated Bilateral Shoulder Flexion Towel Slide at Table Top  - 2 x daily - 7 x weekly - 1 sets - 5-10 reps - 10 sec  hold - Seated Shoulder External Rotation AAROM with Cane and Hand in Neutral  - 2 x daily - 7 x weekly - 1 sets - 5-10 reps - 5-10 sec  hold - Isometric Shoulder Extension at Wall  - 2 x daily - 7 x weekly - 1 sets - 5-10 reps - 5 sec  hold - Standing Isometric Shoulder Abduction with Doorway - Arm Bent  - 2 x daily - 7 x weekly - 1 sets - 5-10 reps - 5 sec  hold - Standing Isometric Shoulder External Rotation with Doorway  - 2 x daily - 7 x weekly - 1 sets - 5-10 reps - 5 sec  hold - Standing Isometric Shoulder Internal Rotation at Doorway  - 2 x daily - 7 x weekly - 1 sets - 5-10 reps - 5 sec  hold - Seated Shoulder Flexion AAROM with Pulley Behind  - 2 x daily - 7 x weekly - 1 sets - 10 reps - 10 sec  hold - Seated Shoulder Scaption AAROM with Pulley at Side  - 2 x daily - 7 x weekly - 1 sets - 10 reps - 10sec  hold - Standing Bilateral Low Shoulder Row with Anchored Resistance  - 1-2 x daily - 7 x weekly - 1-2 sets - 10 reps - 2-3 sec  hold - Shoulder extension with resistance - Neutral  - 1-2 x daily - 7 x weekly - 1-2 sets - 10 reps - 3-5 sec  hold - Seated  Sidebending  - 2 x daily - 7 x weekly - 1 sets - 3-5 reps - 15-20sec  hold - Seated Trunk Rotation - Arms Crossed  - 2 x daily - 7 x weekly - 1 sets - 3-5 reps - 5-10sec  hold - Supine Alternating Shoulder Flexion  - 2 x  daily - 7 x weekly - 1-2 sets - 10 reps - 2 sec  hold - Supine Single Arm Shoulder Protraction  - 2 x daily - 7 x weekly - 1 sets - 10-15 reps -   hold - Supine Shoulder Rhythmic Stabilization- Horizontal Abduction/Adduction  - 2 x daily - 7 x weekly - 1 sets - 10-20 reps - Shoulder External Rotation and Scapular Retraction with Resistance  - 2 x daily - 7 x weekly - 1 sets - 10 reps - 3-5 sec  hold - Standing Shoulder Flexion to 90 Degrees  - 2 x daily - 7 x weekly - 1 sets - 10 reps - 3 sec  hold - Standing Shoulder Scaption  - 2 x daily - 7 x weekly - 1 sets - 10 reps - 3 sec  hold - Supine Shoulder Rhythmic Stabilization- Horizontal Abduction/Adduction  - 2 x daily - 7 x weekly - 1 sets - 5-10 reps - Shoulder External Rotation with Anchored Resistance  - 2 x daily - 7 x weekly - 1-2 sets - 10 reps - 3 sec  hold - Shoulder Internal Rotation with Resistance  - 2 x daily - 7 x weekly - 2 sets - 10 reps - 3 sec  hold - Shoulder External Rotation and Scapular Retraction with Resistance  - 2 x daily - 7 x weekly - 1 sets - 10 reps - 3-5 sec  hold  ASSESSMENT:  CLINICAL IMPRESSION:  Continued progress with decreased pain and improving strength and function L UE. Progressing exercises and reps for L shoulder strengthening. Continued manual work including P/AAROM L shoulder and manual work through the L thoracic and lumbar area. Continued with stretches for trunk in sitting and supine. Patient has continued tightness  and tenderness to palpation through L lateral scapular area, mid to low back.    EVAL: Patient is a 77 y.o. female who was seen today for physical therapy evaluation and treatment for shoulder rehab s/p L reverse TSA 06/16/23. She presents with poor posture and alignment; limited L shoulder ROM, strength, mobility, function; limited ADL's; pain on an intermittent basis. She will benefit from PT to address problems identified.   OBJECTIVE IMPAIRMENTS: decreased activity tolerance,  decreased mobility, decreased ROM, decreased strength, hypomobility, increased fascial restrictions, increased muscle spasms, impaired flexibility, impaired UE functional use, improper body mechanics, postural dysfunction, obesity, and pain.    GOALS: Goals reviewed with patient? Yes  SHORT TERM GOALS: Target date: 09/13/2023   Independent in initial HEP  Baseline: Goal status: INITIAL  2.  Patient to demonstrate improved scapular control with posterior shoulder girdle engaged for exercises  Baseline:  Goal status: INITIAL   LONG TERM GOALS: Target date: 10/25/2023   Improve posture and alignment with patient to demonstrate improved posterior shoulder activation and control for functional movements  Baseline:  Goal status: INITIAL  2.  Increase active AROM L shoulder to ~ equal to AROM R shoulder for flexion; scaption; ER  Baseline:  Goal status: INITIAL  3.  4/5 strength L shoulder  Baseline:  Goal status: INITIAL  4.  Patient reports ability to use L UE for functional activities required  for independent living  Baseline:  Goal status: INITIAL  5.  Independent in HEP including aquatic program as indicated  Baseline:  Goal status: INITIAL   PLAN:  PT FREQUENCY: 1-2 times/week  PT DURATION: 12 weeks  PLANNED INTERVENTIONS: Therapeutic exercises, Therapeutic activity, Neuromuscular re-education, Patient/Family education, Self Care, Joint mobilization, Aquatic Therapy, Dry Needling, Electrical stimulation, Spinal mobilization, Cryotherapy, Moist heat, Ultrasound, Ionotophoresis 4mg /ml Dexamethasone, Manual therapy, and Re-evaluation  PLAN FOR NEXT SESSION: review and progress with exercise program per protocol; manual work, DN, modalities as indicated   Revella Shelton P. Leonor Liv PT, MPH 08/31/23 11:54 AM

## 2023-09-04 ENCOUNTER — Ambulatory Visit
Admission: RE | Admit: 2023-09-04 | Discharge: 2023-09-04 | Disposition: A | Discharge status: Home / Self Care | Payer: Medicare Other | Source: Ambulatory Visit | Attending: Family Medicine | Admitting: Family Medicine

## 2023-09-04 DIAGNOSIS — R1904 Left lower quadrant abdominal swelling, mass and lump: Secondary | ICD-10-CM

## 2023-09-04 DIAGNOSIS — R1032 Left lower quadrant pain: Secondary | ICD-10-CM

## 2023-09-06 ENCOUNTER — Ambulatory Visit: Payer: Medicare Other | Admitting: Rehabilitative and Restorative Service Providers"

## 2023-09-12 ENCOUNTER — Ambulatory Visit: Payer: Medicare Other | Attending: Orthopedic Surgery | Admitting: Rehabilitative and Restorative Service Providers"

## 2023-09-12 ENCOUNTER — Encounter: Payer: Self-pay | Admitting: Rehabilitative and Restorative Service Providers"

## 2023-09-12 DIAGNOSIS — G8929 Other chronic pain: Secondary | ICD-10-CM | POA: Diagnosis present

## 2023-09-12 DIAGNOSIS — M25612 Stiffness of left shoulder, not elsewhere classified: Secondary | ICD-10-CM | POA: Insufficient documentation

## 2023-09-12 DIAGNOSIS — R293 Abnormal posture: Secondary | ICD-10-CM | POA: Insufficient documentation

## 2023-09-12 DIAGNOSIS — M25512 Pain in left shoulder: Secondary | ICD-10-CM | POA: Insufficient documentation

## 2023-09-12 DIAGNOSIS — M6281 Muscle weakness (generalized): Secondary | ICD-10-CM | POA: Diagnosis present

## 2023-09-12 DIAGNOSIS — M5459 Other low back pain: Secondary | ICD-10-CM | POA: Diagnosis present

## 2023-09-12 DIAGNOSIS — R29898 Other symptoms and signs involving the musculoskeletal system: Secondary | ICD-10-CM | POA: Diagnosis present

## 2023-09-12 NOTE — Therapy (Addendum)
OUTPATIENT PHYSICAL THERAPY TREATMENT AND DISCHARGE SUMMARY   PHYSICAL THERAPY DISCHARGE SUMMARY  Visits from Start of Care: 9  Current functional level related to goals / functional outcomes: SEE PROGRESS NOTE FOR DIACHARGE STATUS    Remaining deficits: Unknown    Education / Equipment: HEP    Patient agrees to discharge. Patient goals were met. Patient is being discharged due to meeting the stated rehab goals.  Taft Worthing P. Leonor Liv PT, MPH 10/26/23 4:05 PM   Patient Name: Brooke Duncan MRN: [id number] DOB:June 13, 1946, 77 y.o., female Today's Date: 09/12/2023  END OF SESSION:  PT End of Session - 09/12/23 1105     Visit Number 9    Number of Visits 24    Date for PT Re-Evaluation 10/25/23    Authorization Type medicare + BCBS supplemental    Authorization - Visit Number 9    Progress Note Due on Visit 10    PT Start Time 1100    PT Stop Time 1148    PT Time Calculation (min) 48 min    Activity Tolerance Patient tolerated treatment well                Past Medical History:  Diagnosis Date   Acquired solitary kidney    right side 2014   Arthritis    Bronchitis 05/08/2023   Cancer (HCC)    renal cell carcinoma- left kidney removed   Complication of anesthesia    slow to wake, hard to take a deep breath with 1 surgery several yrs ago   Frequency of urination    Headache    Heart murmur    per told by pcp approx. 02/ 2022 heard a very faint murmur, told did need work-up done at this time   History of COVID-19 2021   all symptoms resolved   History of Hashimoto thyroiditis    s/p  total thyroidectomy 1995   History of hypercalcemia    s/p  parathryoidectomy 07/ 2021   History of renal cell carcinoma 2014   s/p  left nephrectomy, pt stated no other treatment   Hypertension    followed by pcp   Hypothyroidism, postsurgical 1995   followed by pcp in [place], moved from [place] 01/ 2021   OSA on CPAP    Thickened endometrium    UTI (urinary tract  infection)    finished keflex 08-12-2022   Wears glasses    Past Surgical History:  Procedure Laterality Date   BREAST BIOPSY Left 08/24/2021   BREAST LUMPECTOMY WITH RADIOACTIVE SEED LOCALIZATION Left 07/14/2022   Procedure: LEFT BREAST LUMPECTOMY WITH RADIOACTIVE SEED LOCALIZATION;  Surgeon: Emelia Loron, MD;  Location: [facility];  Service: General;  Laterality: Left;   CYSTOSCOPY  08/04/2022   in dr winter office   CYSTOSCOPY WITH BIOPSY N/A 09/02/2022   Procedure: CYSTOSCOPY WITH BLADDER BIOPSY;  Surgeon: Rene Paci, MD;  Location[facility];  Service: Urology;  Laterality: N/A;  ONLY NEEDS 30 MIN   HYSTEROSCOPY WITH D & C N/A 04/08/2021   Procedure: DILATATION AND CURETTAGE /HYSTEROSCOPY, EXCISION OF VAGINAL LESION;  Surgeon: Silverio Lay, MD;  Location: [facility];  Service: Gynecology;  Laterality: N/A;   LAPAROSCOPIC CHOLECYSTECTOMY  2005   NEPHRECTOMY Left 2014   PARATHYROIDECTOMY  07/ 2021  in [place]   REVERSE SHOULDER ARTHROPLASTY Left 06/16/2023   Procedure: REVERSE SHOULDER ARTHROPLASTY;  Surgeon: Beverely Low, MD;  Location: [facility];  Service: Orthopedics;  Laterality: Left;  TONSILLECTOMY  age 4   TOTAL HIP ARTHROPLASTY Bilateral left 2015;  right 2009   TOTAL KNEE ARTHROPLASTY Left 2014   TOTAL THYROIDECTOMY Bilateral 1995   WISDOM TOOTH EXTRACTION Bilateral    Patient Active Problem List   Diagnosis Date Noted   H/O total shoulder replacement, left 06/16/2023   Colonic mass 11/03/2022   Malignant neoplasm of upper-outer quadrant of left breast in female, estrogen receptor positive (HCC) 08/27/2021   Abnormal ultrasound of endometrium 04/08/2021    PCP: Dr Daisy Floro   REFERRING PROVIDER: Dr Malon Kindle   REFERRING DIAG: L reverse TSA  THERAPY DIAG:  Other symptoms and signs involving the musculoskeletal system  Muscle weakness (generalized)  Chronic left shoulder  pain  Abnormal posture  Stiffness of left shoulder, not elsewhere classified  Other low back pain  Rationale for Evaluation and Treatment: Rehabilitation  ONSET DATE: surgery 06/16/23  SUBJECTIVE:                                                                                                                                                                                      SUBJECTIVE STATEMENT: 10 weeks 3 days post surgery  [name] reports that she is driving now and her shoulder is doing better. She was able to get her walker in and out of the back of the trunk. Doing well with exercises at home. Still having some tightness more than pain in the L mid to lower back but it is getting much better. Stretches for the back help but then the back it does not tighten as much between stretches. She is trying to stretch before it tightens up.  RTD tomorrow   EVALUATION:Patient reports that she has had L shoulder pain for ~ 5 years with gradual increase in symptoms including pain and weakness L UE. She has difficulty with functional activities involving L UE. She could not even comb her hair with the L UE. She has difficulty sleeping due to aching L UE. Underwent L reverse TSA 06/16/23 with no complications. She is having some discomfort and stiffness in the L shoulder. She has limited ROM.  Hand dominance: Right  PERTINENT HISTORY: Arthritis; HTN; gall bladder removed; L TKA 2014; L posterior hip pain; L shoulder pain; L reverse TSA;  L breast cancer continues on estrogen inhibitor treatment   PAIN:  Are you having pain? Yes: NPRS scale: 0-2/10 0/10 at rest Pain location: L shoulder  Pain description: stiffness, some muscle soreness in upper arm Aggravating factors: movement Relieving factors: rest and meds  PRECAUTIONS: Other: reverse TSA protocol   WEIGHT BEARING RESTRICTIONS: Yes no weight bearing L UE   FALLS:  Has patient fallen in last 6 months? No  LIVING ENVIRONMENT: Lives  alone Lives in: House/apartment Stairs: No  OCCUPATION: Retired Cytogeneticist work; active with household chores; sewing quilting; reading   PATIENT GOALS:to use L arm for functional activities again   NEXT MD VISIT: 09/13/23  OBJECTIVE:   DIAGNOSTIC FINDINGS:  CT scan 2023: IMPRESSION: 1. Severe left glenohumeral joint degenerative changes as detailed above. 2. Moderate AC joint degenerative changes. 3. Grossly by CT the rotator cuff tendons are intact.  PATIENT SURVEYS:  FOTO not done for shoulder      SENSATION: WFL  POSTURE: Patient presents with head forward posture with increased thoracic kyphosis; shoulders rounded and elevated; scapulae abducted and rotated along the thoracic spine; head of the humerus anterior in orientation.   UPPER EXTREMITY ROM:   Active ROM Right eval Left eval Left  09/12/23  Shoulder flexion 114 65 118  Shoulder extension 49 NT 40  Shoulder abduction 84 67 94  Shoulder adduction     Shoulder internal rotation Hand to lateral hip NT Hand to lateral hip   Shoulder external rotation 40 elbow at side  19 elbow at side  28 elbow at side   Elbow flexion     Elbow extension     Wrist flexion     Wrist extension     Wrist ulnar deviation     Wrist radial deviation     Wrist pronation     Wrist supination     (Blank rows = not tested)  UPPER EXTREMITY MMT: not tested   MMT Right eval Left eval  Shoulder flexion    Shoulder extension    Shoulder abduction    Shoulder adduction    Shoulder internal rotation    Shoulder external rotation    Middle trapezius    Lower trapezius    Elbow flexion    Elbow extension    Wrist flexion    Wrist extension    Wrist ulnar deviation    Wrist radial deviation    Wrist pronation    Wrist supination    Grip strength (lbs)    (Blank rows = not tested)  PALPATION:  Tightness L shoulder girdle   08/28/23: tightness and tenderness to palpation through L shoulder girdle in pecs, upper  trap, biceps, L lateral scapular area, mid to low back 09/12/23: decreased tightness to palpation through L shoulder girdle into the mid back area   [facility] Adult PT Treatment:                                                DATE: 09/12/23  Therapeutic Exercise: Sitting  Pulley flexion 10 sec x 10 Pulley scaption 10 sec x 10  Horizontal abduction and adduction for gentle ROM x 10  Shoulder flexion pushing walker out 10 sec x 5 x 2 sets Sitting lateral side bend 5-10 sec x 5 R/L Sitting trunk rotation 5-10 sec x 5  Scap squeeze with ER red TB 3 sec x 10    Standing  Row green TB x 10 x 2  Shoulder ext green TB x 10 x 2  L Shoulder IR red TB 3 sec x 10 x 2 L Shoulder ER red TB 3 sec x 10 x 2  Active shoulder flexion 1# DB 3 sec x  12  Active scaption 1# DB 3  sec x 10 x 2  Scap squeeze 3 sec x 16 Bilat ER red TB 3 sec x 10 x 2  Supine (HEP)  Scap squeeze 10 sec x 10   Shoulder flexion supine long arm against gravity ~ 110 deg x 10 to patient limit  Punch to ceiling x 10  Rhythmic stabilization  Trunk rotation 5-10 sec x 5 R/L; repeated with bilat UE resting at ~ 60 deg abduction at sides  Manual Therapy: PROM Lt shoulder to tissue tolerance and within limits of protocol - pt supine STM Lt shoulder girdle -pt supine  [facility] Adult PT Treatment:                                                DATE: 08/31/23  Therapeutic Exercise: Sitting  Pulley flexion 10 sec x 10 Pulley scaption 10 sec x 10  Horizontal abduction and adduction for gentle ROM x 10  Shoulder flexion pushing walker out 10 sec x 5 x 2 sets Scap squeeze 10 x 3 sec Lt shoulder AAROM ER with dowel seated 10 x 5 sec hold Sitting lateral side bend 5-10 sec x 5 R/L Sitting trunk rotation 5-10 sec x 5  Scap squeeze with ER red TB 3 sec x 10    Standing  Row green TB x 10 x 2  Shoulder ext green TB x 10 x 2 (standing closer)  L Shoulder IR red TB 3 sec x 10 x 2 L Shoulder ER red TB 3 sec x 10 x 2  Active shoulder flexion 3  sec x 10 x 2  Active scaption 3 sec x 10 x 2  Scap squeeze 3 sec x 16 Bilat ER red TB 3 sec x 10 x 2  Supine  Scap squeeze 10 sec x 10   Shoulder flexion supine long arm against gravity ~ 110 deg x 10 to patient limit  Punch to ceiling x 10  Rhythmic stabilization  Trunk rotation 5-10 sec x 5 R/L; repeated with bilat UE resting at ~ 60 deg abduction at sides  Manual Therapy: PROM Lt shoulder to tissue tolerance and within limits of protocol - pt supine STM Lt shoulder girdle -pt sitting   PATIENT EDUCATION: Education details: HEP  Person educated: Patient Education method: Programmer, multimedia, Demonstration, Actor cues, Verbal cues, and Handouts Education comprehension: verbalized understanding, returned demonstration, verbal cues required, tactile cues required, and needs further education  HOME EXERCISE PROGRAM: Access Code: [email or web address] URL: [email or web address] Date: 08/31/2023 Prepared by: Corlis Leak  Exercises - Seated Scapular Retraction  - 2 x daily - 7 x weekly - 1-2 sets - 10 reps - 10 sec  hold - Seated Bilateral Shoulder Flexion Towel Slide at Table Top  - 2 x daily - 7 x weekly - 1 sets - 5-10 reps - 10 sec  hold - Seated Shoulder External Rotation AAROM with Cane and Hand in Neutral  - 2 x daily - 7 x weekly - 1 sets - 5-10 reps - 5-10 sec  hold - Isometric Shoulder Extension at Wall  - 2 x daily - 7 x weekly - 1 sets - 5-10 reps - 5 sec  hold - Standing Isometric Shoulder Abduction with Doorway - Arm Bent  - 2 x daily - 7 x weekly - 1 sets - 5-10 reps - 5 sec  hold - Standing Isometric  Shoulder External Rotation with Doorway  - 2 x daily - 7 x weekly - 1 sets - 5-10 reps - 5 sec  hold - Standing Isometric Shoulder Internal Rotation at Doorway  - 2 x daily - 7 x weekly - 1 sets - 5-10 reps - 5 sec  hold - Seated Shoulder Flexion AAROM with Pulley Behind  - 2 x daily - 7 x weekly - 1 sets - 10 reps - 10 sec  hold - Seated Shoulder Scaption AAROM with Pulley at  Side  - 2 x daily - 7 x weekly - 1 sets - 10 reps - 10sec  hold - Standing Bilateral Low Shoulder Row with Anchored Resistance  - 1-2 x daily - 7 x weekly - 1-2 sets - 10 reps - 2-3 sec  hold - Shoulder extension with resistance - Neutral  - 1-2 x daily - 7 x weekly - 1-2 sets - 10 reps - 3-5 sec  hold - Seated Sidebending  - 2 x daily - 7 x weekly - 1 sets - 3-5 reps - 15-20sec  hold - Seated Trunk Rotation - Arms Crossed  - 2 x daily - 7 x weekly - 1 sets - 3-5 reps - 5-10sec  hold - Supine Alternating Shoulder Flexion  - 2 x daily - 7 x weekly - 1-2 sets - 10 reps - 2 sec  hold - Supine Single Arm Shoulder Protraction  - 2 x daily - 7 x weekly - 1 sets - 10-15 reps -   hold - Supine Shoulder Rhythmic Stabilization- Horizontal Abduction/Adduction  - 2 x daily - 7 x weekly - 1 sets - 10-20 reps - Shoulder External Rotation and Scapular Retraction with Resistance  - 2 x daily - 7 x weekly - 1 sets - 10 reps - 3-5 sec  hold - Standing Shoulder Flexion to 90 Degrees  - 2 x daily - 7 x weekly - 1 sets - 10 reps - 3 sec  hold - Standing Shoulder Scaption  - 2 x daily - 7 x weekly - 1 sets - 10 reps - 3 sec  hold - Supine Shoulder Rhythmic Stabilization- Horizontal Abduction/Adduction  - 2 x daily - 7 x weekly - 1 sets - 5-10 reps - Shoulder External Rotation with Anchored Resistance  - 2 x daily - 7 x weekly - 1-2 sets - 10 reps - 3 sec  hold - Shoulder Internal Rotation with Resistance  - 2 x daily - 7 x weekly - 2 sets - 10 reps - 3 sec  hold - Shoulder External Rotation and Scapular Retraction with Resistance  - 2 x daily - 7 x weekly - 1 sets - 10 reps - 3-5 sec  hold  ASSESSMENT:  CLINICAL IMPRESSION:  Continued improvement in L shoulder with decreased pain and improving strength and function L UE. Progressing exercises and reps for L shoulder strengthening. Continued manual work including P/AAROM L shoulder and manual work through the L thoracic and lumbar area. Continued with stretches for  trunk in sitting and supine. Patient has continued tightness  and tenderness to palpation through L lateral scapular area, mid to low back but tightness and tenderness is improving. Patient is progressing well toward stated goals of rehab and will benefit from continued treatment to achieve maximum rehab potential. Note to MD    EVAL: Patient is a 77 y.o. female who was seen today for physical therapy evaluation and treatment for shoulder rehab s/p L reverse TSA  06/16/23. She presents with poor posture and alignment; limited L shoulder ROM, strength, mobility, function; limited ADL's; pain on an intermittent basis. She will benefit from PT to address problems identified.   OBJECTIVE IMPAIRMENTS: decreased activity tolerance, decreased mobility, decreased ROM, decreased strength, hypomobility, increased fascial restrictions, increased muscle spasms, impaired flexibility, impaired UE functional use, improper body mechanics, postural dysfunction, obesity, and pain.    GOALS: Goals reviewed with patient? Yes  SHORT TERM GOALS: Target date: 09/13/2023   Independent in initial HEP  Baseline: Goal status: met  2.  Patient to demonstrate improved scapular control with posterior shoulder girdle engaged for exercises  Baseline:  Goal status: met   LONG TERM GOALS: Target date: 10/25/2023   Improve posture and alignment with patient to demonstrate improved posterior shoulder activation and control for functional movements  Baseline:  Goal status: on going   2.  Increase active AROM L shoulder to ~ equal to AROM R shoulder for flexion; scaption; ER  Baseline:  Goal status: on going   3.  4/5 strength L shoulder  Baseline:  Goal status: on going   4.  Patient reports ability to use L UE for functional activities required for independent living  Baseline:  Goal status: on going   5.  Independent in HEP including aquatic program as indicated  Baseline:  Goal status: on going     PLAN:  PT FREQUENCY: 1-2 times/week  PT DURATION: 12 weeks  PLANNED INTERVENTIONS: Therapeutic exercises, Therapeutic activity, Neuromuscular re-education, Patient/Family education, Self Care, Joint mobilization, Aquatic Therapy, Dry Needling, Electrical stimulation, Spinal mobilization, Cryotherapy, Moist heat, Ultrasound, Ionotophoresis 4mg /ml Dexamethasone, Manual therapy, and Re-evaluation  PLAN FOR NEXT SESSION: review and progress with exercise program per protocol; manual work, DN, modalities as indicated   Jashaun Penrose P. Leonor Liv PT, MPH 09/12/23 11:06 AM

## 2023-09-14 ENCOUNTER — Ambulatory Visit: Payer: Medicare Other | Admitting: Physical Therapy

## 2023-09-14 NOTE — Therapy (Deleted)
OUTPATIENT PHYSICAL THERAPY TREATMENT   Patient Name: Brooke Duncan MRN: [id number] DOB:08/04/1946, 77 y.o., female Today's Date: 09/14/2023  END OF SESSION:       Past Medical History:  Diagnosis Date   Acquired solitary kidney    right side 2014   Arthritis    Bronchitis 05/08/2023   Cancer (HCC)    renal cell carcinoma- left kidney removed   Complication of anesthesia    slow to wake, hard to take a deep breath with 1 surgery several yrs ago   Frequency of urination    Headache    Heart murmur    per told by pcp approx. 02/ 2022 heard a very faint murmur, told did need work-up done at this time   History of COVID-19 2021   all symptoms resolved   History of Hashimoto thyroiditis    s/p  total thyroidectomy 1995   History of hypercalcemia    s/p  parathryoidectomy 07/ 2021   History of renal cell carcinoma 2014   s/p  left nephrectomy, pt stated no other treatment   Hypertension    followed by pcp   Hypothyroidism, postsurgical 1995   followed by pcp in [place], moved from [place] 01/ 2021   OSA on CPAP    Thickened endometrium    UTI (urinary tract infection)    finished keflex 08-12-2022   Wears glasses    Past Surgical History:  Procedure Laterality Date   BREAST BIOPSY Left 08/24/2021   BREAST LUMPECTOMY WITH RADIOACTIVE SEED LOCALIZATION Left 07/14/2022   Procedure: LEFT BREAST LUMPECTOMY WITH RADIOACTIVE SEED LOCALIZATION;  Surgeon: Emelia Loron, MD;  Location: [facility];  Service: General;  Laterality: Left;   CYSTOSCOPY  08/04/2022   in dr winter office   CYSTOSCOPY WITH BIOPSY N/A 09/02/2022   Procedure: CYSTOSCOPY WITH BLADDER BIOPSY;  Surgeon: Rene Paci, MD;  Location: [facility];  Service: Urology;  Laterality: N/A;  ONLY NEEDS 30 MIN   HYSTEROSCOPY WITH D & C N/A 04/08/2021   Procedure: DILATATION AND CURETTAGE /HYSTEROSCOPY, EXCISION OF VAGINAL LESION;  Surgeon: Silverio Lay, MD;   Location: [facility];  Service: Gynecology;  Laterality: N/A;   LAPAROSCOPIC CHOLECYSTECTOMY  2005   NEPHRECTOMY Left 2014   PARATHYROIDECTOMY  07/ 2021  in [place]   REVERSE SHOULDER ARTHROPLASTY Left 06/16/2023   Procedure: REVERSE SHOULDER ARTHROPLASTY;  Surgeon: Beverely Low, MD;  Location: [facility];  Service: Orthopedics;  Laterality: Left;   TONSILLECTOMY  age 5   TOTAL HIP ARTHROPLASTY Bilateral left 2015;  right 2009   TOTAL KNEE ARTHROPLASTY Left 2014   TOTAL THYROIDECTOMY Bilateral 1995   WISDOM TOOTH EXTRACTION Bilateral    Patient Active Problem List   Diagnosis Date Noted   H/O total shoulder replacement, left 06/16/2023   Colonic mass 11/03/2022   Malignant neoplasm of upper-outer quadrant of left breast in female, estrogen receptor positive (HCC) 08/27/2021   Abnormal ultrasound of endometrium 04/08/2021    PCP: Dr Daisy Floro   REFERRING PROVIDER: Dr Malon Kindle   REFERRING DIAG: L reverse TSA  THERAPY DIAG:  No diagnosis found.  Rationale for Evaluation and Treatment: Rehabilitation  ONSET DATE: L rTSA 06/16/23  SUBJECTIVE:  SUBJECTIVE STATEMENT:  *** [name] reports that she is driving now and her shoulder is doing better. She was able to get her walker in and out of the back of the trunk. Doing well with exercises at home. Still having some tightness more than pain in the L mid to lower back but it is getting much better. Stretches for the back help but then the back it does not tighten as much between stretches. She is trying to stretch before it tightens up.  RTD tomorrow   EVALUATION:Patient reports that she has had L shoulder pain for ~ 5 years with gradual increase in symptoms including pain and weakness L UE. She has difficulty with functional activities  involving L UE. She could not even comb her hair with the L UE. She has difficulty sleeping due to aching L UE. Underwent L reverse TSA 06/16/23 with no complications. She is having some discomfort and stiffness in the L shoulder. She has limited ROM.  Hand dominance: Right  PERTINENT HISTORY: Arthritis; HTN; gall bladder removed; L TKA 2014; L posterior hip pain; L shoulder pain; L reverse TSA;  L breast cancer continues on estrogen inhibitor treatment   PAIN:  Are you having pain? Yes: NPRS scale: 0-2/10 0/10 at rest Pain location: L shoulder  Pain description: stiffness, some muscle soreness in upper arm Aggravating factors: movement Relieving factors: rest and meds  PRECAUTIONS: Other: reverse TSA protocol   WEIGHT BEARING RESTRICTIONS: Yes no weight bearing L UE   FALLS:  Has patient fallen in last 6 months? No  LIVING ENVIRONMENT: Lives alone Lives in: House/apartment Stairs: No  OCCUPATION: Retired Cytogeneticist work; active with household chores; sewing quilting; reading   PATIENT GOALS:to use L arm for functional activities again   NEXT MD VISIT: 09/13/23  OBJECTIVE:   DIAGNOSTIC FINDINGS:  CT scan 2023: IMPRESSION: 1. Severe left glenohumeral joint degenerative changes as detailed above. 2. Moderate AC joint degenerative changes. 3. Grossly by CT the rotator cuff tendons are intact.  PATIENT SURVEYS:  FOTO not done for shoulder      SENSATION: WFL  POSTURE: Patient presents with head forward posture with increased thoracic kyphosis; shoulders rounded and elevated; scapulae abducted and rotated along the thoracic spine; head of the humerus anterior in orientation.   UPPER EXTREMITY ROM:   Active ROM Right eval Left eval Left  09/12/23  Shoulder flexion 114 65 118  Shoulder extension 49 NT 40  Shoulder abduction 84 67 94  Shoulder adduction     Shoulder internal rotation Hand to lateral hip NT Hand to lateral hip   Shoulder external rotation 40  elbow at side  19 elbow at side  28 elbow at side   Elbow flexion     Elbow extension     Wrist flexion     Wrist extension     Wrist ulnar deviation     Wrist radial deviation     Wrist pronation     Wrist supination     (Blank rows = not tested)  UPPER EXTREMITY MMT: not tested   MMT Right eval Left eval  Shoulder flexion    Shoulder extension    Shoulder abduction    Shoulder adduction    Shoulder internal rotation    Shoulder external rotation    Middle trapezius    Lower trapezius    Elbow flexion    Elbow extension    Wrist flexion    Wrist extension    Wrist ulnar deviation  Wrist radial deviation    Wrist pronation    Wrist supination    Grip strength (lbs)    (Blank rows = not tested)  PALPATION:  Tightness L shoulder girdle   08/28/23: tightness and tenderness to palpation through L shoulder girdle in pecs, upper trap, biceps, L lateral scapular area, mid to low back 09/12/23: decreased tightness to palpation through L shoulder girdle into the mid back area   [name] Adult PT Treatment:                                                DATE: 09/14/23 Therapeutic Exercise: *** Manual Therapy: *** Neuromuscular re-ed: *** Therapeutic Activity: *** Gait: *** Modalities: *** Self Care: ***   [facility] Adult PT Treatment:                                                DATE: 09/12/23 Therapeutic Exercise: Sitting  Pulley flexion 10 sec x 10 Pulley scaption 10 sec x 10  Horizontal abduction and adduction for gentle ROM x 10  Shoulder flexion pushing walker out 10 sec x 5 x 2 sets Sitting lateral side bend 5-10 sec x 5 R/L Sitting trunk rotation 5-10 sec x 5  Scap squeeze with ER red TB 3 sec x 10    Standing  Row green TB x 10 x 2  Shoulder ext green TB x 10 x 2  L Shoulder IR red TB 3 sec x 10 x 2 L Shoulder ER red TB 3 sec x 10 x 2  Active shoulder flexion 1# DB 3 sec x  12  Active scaption 1# DB 3 sec x 10 x 2  Scap squeeze 3 sec x 16 Bilat ER red  TB 3 sec x 10 x 2  Supine (HEP)  Scap squeeze 10 sec x 10   Shoulder flexion supine long arm against gravity ~ 110 deg x 10 to patient limit  Punch to ceiling x 10  Rhythmic stabilization  Trunk rotation 5-10 sec x 5 R/L; repeated with bilat UE resting at ~ 60 deg abduction at sides  Manual Therapy: PROM Lt shoulder to tissue tolerance and within limits of protocol - pt supine STM Lt shoulder girdle -pt supine  [facility] Adult PT Treatment:                                                DATE: 08/31/23 Therapeutic Exercise: Sitting  Pulley flexion 10 sec x 10 Pulley scaption 10 sec x 10  Horizontal abduction and adduction for gentle ROM x 10  Shoulder flexion pushing walker out 10 sec x 5 x 2 sets Scap squeeze 10 x 3 sec Lt shoulder AAROM ER with dowel seated 10 x 5 sec hold Sitting lateral side bend 5-10 sec x 5 R/L Sitting trunk rotation 5-10 sec x 5  Scap squeeze with ER red TB 3 sec x 10    Standing  Row green TB x 10 x 2  Shoulder ext green TB x 10 x 2 (standing closer)  L Shoulder IR red TB 3 sec  x 10 x 2 L Shoulder ER red TB 3 sec x 10 x 2  Active shoulder flexion 3 sec x 10 x 2  Active scaption 3 sec x 10 x 2  Scap squeeze 3 sec x 16 Bilat ER red TB 3 sec x 10 x 2  Supine  Scap squeeze 10 sec x 10   Shoulder flexion supine long arm against gravity ~ 110 deg x 10 to patient limit  Punch to ceiling x 10  Rhythmic stabilization  Trunk rotation 5-10 sec x 5 R/L; repeated with bilat UE resting at ~ 60 deg abduction at sides  Manual Therapy: PROM Lt shoulder to tissue tolerance and within limits of protocol - pt supine STM Lt shoulder girdle -pt sitting   PATIENT EDUCATION: Education details: HEP  Person educated: Patient Education method: Programmer, multimedia, Demonstration, Actor cues, Verbal cues, and Handouts Education comprehension: verbalized understanding, returned demonstration, verbal cues required, tactile cues required, and needs further education  HOME EXERCISE  PROGRAM: Access Code: [email or web address] URL: [email or web address] Date: 08/31/2023 Prepared by: Corlis Leak  Exercises - Seated Scapular Retraction  - 2 x daily - 7 x weekly - 1-2 sets - 10 reps - 10 sec  hold - Seated Bilateral Shoulder Flexion Towel Slide at Table Top  - 2 x daily - 7 x weekly - 1 sets - 5-10 reps - 10 sec  hold - Seated Shoulder External Rotation AAROM with Cane and Hand in Neutral  - 2 x daily - 7 x weekly - 1 sets - 5-10 reps - 5-10 sec  hold - Isometric Shoulder Extension at Wall  - 2 x daily - 7 x weekly - 1 sets - 5-10 reps - 5 sec  hold - Standing Isometric Shoulder Abduction with Doorway - Arm Bent  - 2 x daily - 7 x weekly - 1 sets - 5-10 reps - 5 sec  hold - Standing Isometric Shoulder External Rotation with Doorway  - 2 x daily - 7 x weekly - 1 sets - 5-10 reps - 5 sec  hold - Standing Isometric Shoulder Internal Rotation at Doorway  - 2 x daily - 7 x weekly - 1 sets - 5-10 reps - 5 sec  hold - Seated Shoulder Flexion AAROM with Pulley Behind  - 2 x daily - 7 x weekly - 1 sets - 10 reps - 10 sec  hold - Seated Shoulder Scaption AAROM with Pulley at Side  - 2 x daily - 7 x weekly - 1 sets - 10 reps - 10sec  hold - Standing Bilateral Low Shoulder Row with Anchored Resistance  - 1-2 x daily - 7 x weekly - 1-2 sets - 10 reps - 2-3 sec  hold - Shoulder extension with resistance - Neutral  - 1-2 x daily - 7 x weekly - 1-2 sets - 10 reps - 3-5 sec  hold - Seated Sidebending  - 2 x daily - 7 x weekly - 1 sets - 3-5 reps - 15-20sec  hold - Seated Trunk Rotation - Arms Crossed  - 2 x daily - 7 x weekly - 1 sets - 3-5 reps - 5-10sec  hold - Supine Alternating Shoulder Flexion  - 2 x daily - 7 x weekly - 1-2 sets - 10 reps - 2 sec  hold - Supine Single Arm Shoulder Protraction  - 2 x daily - 7 x weekly - 1 sets - 10-15 reps -   hold - Supine Shoulder Rhythmic Stabilization- Horizontal Abduction/Adduction  -  2 x daily - 7 x weekly - 1 sets - 10-20 reps - Shoulder External  Rotation and Scapular Retraction with Resistance  - 2 x daily - 7 x weekly - 1 sets - 10 reps - 3-5 sec  hold - Standing Shoulder Flexion to 90 Degrees  - 2 x daily - 7 x weekly - 1 sets - 10 reps - 3 sec  hold - Standing Shoulder Scaption  - 2 x daily - 7 x weekly - 1 sets - 10 reps - 3 sec  hold - Supine Shoulder Rhythmic Stabilization- Horizontal Abduction/Adduction  - 2 x daily - 7 x weekly - 1 sets - 5-10 reps - Shoulder External Rotation with Anchored Resistance  - 2 x daily - 7 x weekly - 1-2 sets - 10 reps - 3 sec  hold - Shoulder Internal Rotation with Resistance  - 2 x daily - 7 x weekly - 2 sets - 10 reps - 3 sec  hold - Shoulder External Rotation and Scapular Retraction with Resistance  - 2 x daily - 7 x weekly - 1 sets - 10 reps - 3-5 sec  hold  ASSESSMENT:  CLINICAL IMPRESSION: *** Continued improvement in L shoulder with decreased pain and improving strength and function L UE. Progressing exercises and reps for L shoulder strengthening. Continued manual work including P/AAROM L shoulder and manual work through the L thoracic and lumbar area. Continued with stretches for trunk in sitting and supine. Patient has continued tightness  and tenderness to palpation through L lateral scapular area, mid to low back but tightness and tenderness is improving. Patient is progressing well toward stated goals of rehab and will benefit from continued treatment to achieve maximum rehab potential. Note to MD    EVAL: Patient is a 77 y.o. female who was seen today for physical therapy evaluation and treatment for shoulder rehab s/p L reverse TSA 06/16/23. She presents with poor posture and alignment; limited L shoulder ROM, strength, mobility, function; limited ADL's; pain on an intermittent basis. She will benefit from PT to address problems identified.   OBJECTIVE IMPAIRMENTS: decreased activity tolerance, decreased mobility, decreased ROM, decreased strength, hypomobility, increased fascial  restrictions, increased muscle spasms, impaired flexibility, impaired UE functional use, improper body mechanics, postural dysfunction, obesity, and pain.    GOALS: Goals reviewed with patient? Yes  SHORT TERM GOALS: Target date: 09/13/2023   Independent in initial HEP  Baseline: Goal status: met  2.  Patient to demonstrate improved scapular control with posterior shoulder girdle engaged for exercises  Baseline:  Goal status: met   LONG TERM GOALS: Target date: 10/25/2023   Improve posture and alignment with patient to demonstrate improved posterior shoulder activation and control for functional movements  Baseline:  Goal status: on going   2.  Increase active AROM L shoulder to ~ equal to AROM R shoulder for flexion; scaption; ER  Baseline:  Goal status: on going   3.  4/5 strength L shoulder  Baseline:  Goal status: on going   4.  Patient reports ability to use L UE for functional activities required for independent living  Baseline:  Goal status: on going   5.  Independent in HEP including aquatic program as indicated  Baseline:  Goal status: on going    PLAN:  PT FREQUENCY: 1-2 times/week  PT DURATION: 12 weeks  PLANNED INTERVENTIONS: Therapeutic exercises, Therapeutic activity, Neuromuscular re-education, Patient/Family education, Self Care, Joint mobilization, Aquatic Therapy, Dry Needling, Electrical stimulation, Spinal mobilization, Cryotherapy,  Moist heat, Ultrasound, Ionotophoresis 4mg /ml Dexamethasone, Manual therapy, and Re-evaluation  PLAN FOR NEXT SESSION: review and progress with exercise program per protocol; manual work, DN, modalities as indicated   Jarrad Mclees April Ma L Waylon Koffler, PT 09/14/23 9:17 AM

## 2023-09-21 ENCOUNTER — Telehealth: Payer: Self-pay | Admitting: *Deleted

## 2023-09-21 ENCOUNTER — Ambulatory Visit (HOSPITAL_BASED_OUTPATIENT_CLINIC_OR_DEPARTMENT_OTHER)
Admission: RE | Admit: 2023-09-21 | Discharge: 2023-09-21 | Disposition: A | Discharge status: Home / Self Care | Payer: Medicare Other | Source: Ambulatory Visit | Attending: Hematology and Oncology | Admitting: Hematology and Oncology

## 2023-09-21 DIAGNOSIS — Z780 Asymptomatic menopausal state: Secondary | ICD-10-CM | POA: Diagnosis present

## 2023-09-21 NOTE — Telephone Encounter (Signed)
-----   Message from Noreene Filbert sent at 09/21/2023  1:19 PM EDT ----- Please let patient know that her bone density shows mild osteopenia.  I recommend calcium, vitamin D, weightbearing exercises and to repeat testing in 2 years time. ----- Message ----- From: Interface, Rad Results In Sent: 09/21/2023  10:40 AM EDT To: Serena Croissant, MD

## 2023-09-21 NOTE — Telephone Encounter (Signed)
RN placed call to pt with below information. Pt educated and verbalized understanding.

## 2023-09-26 ENCOUNTER — Ambulatory Visit
Admission: RE | Admit: 2023-09-26 | Discharge: 2023-09-26 | Disposition: A | Discharge status: Home / Self Care | Payer: Medicare Other | Source: Ambulatory Visit | Attending: Hematology and Oncology | Admitting: Hematology and Oncology

## 2023-09-26 DIAGNOSIS — Z170 Estrogen receptor positive status [ER+]: Secondary | ICD-10-CM

## 2023-12-13 ENCOUNTER — Other Ambulatory Visit: Payer: Self-pay

## 2023-12-13 ENCOUNTER — Encounter (HOSPITAL_BASED_OUTPATIENT_CLINIC_OR_DEPARTMENT_OTHER): Payer: Self-pay | Admitting: Orthopedic Surgery

## 2023-12-20 DIAGNOSIS — I10 Essential (primary) hypertension: Secondary | ICD-10-CM

## 2024-01-19 ENCOUNTER — Other Ambulatory Visit: Payer: Self-pay

## 2024-01-19 ENCOUNTER — Encounter (HOSPITAL_BASED_OUTPATIENT_CLINIC_OR_DEPARTMENT_OTHER): Payer: Self-pay | Admitting: Orthopedic Surgery

## 2024-01-19 ENCOUNTER — Encounter (HOSPITAL_BASED_OUTPATIENT_CLINIC_OR_DEPARTMENT_OTHER)
Admission: RE | Admit: 2024-01-19 | Discharge: 2024-01-19 | Disposition: A | Discharge status: Home / Self Care | Payer: Medicare Other | Source: Ambulatory Visit | Attending: Orthopedic Surgery | Admitting: Orthopedic Surgery

## 2024-01-19 DIAGNOSIS — Z01810 Encounter for preprocedural cardiovascular examination: Secondary | ICD-10-CM | POA: Insufficient documentation

## 2024-01-19 DIAGNOSIS — I10 Essential (primary) hypertension: Secondary | ICD-10-CM | POA: Diagnosis not present

## 2024-01-19 DIAGNOSIS — R9431 Abnormal electrocardiogram [ECG] [EKG]: Secondary | ICD-10-CM | POA: Diagnosis not present

## 2024-01-19 DIAGNOSIS — Z01818 Encounter for other preprocedural examination: Secondary | ICD-10-CM | POA: Diagnosis present

## 2024-01-19 NOTE — Progress Notes (Signed)
Pt given ensure presurgery drink with instruction to complete DOS by 0530. Written instruction also provided. Pt verbalized understanding.       Enhanced Recovery after Surgery for Orthopedics Enhanced Recovery after Surgery is a protocol used to improve the stress on your body and your recovery after surgery.  Patient Instructions  The night before surgery:  No food after midnight. ONLY clear liquids after midnight  The day of surgery (if you do NOT have diabetes):  Drink ONE (1) Pre-Surgery Clear Ensure as directed.   This drink was given to you during your hospital  pre-op appointment visit. The pre-op nurse will instruct you on the time to drink the  Pre-Surgery Ensure depending on your surgery time. Finish the drink at the designated time by the pre-op nurse.  Nothing else to drink after completing the  Pre-Surgery Clear Ensure.  The day of surgery (if you have diabetes): Drink ONE (1) Gatorade 2 (G2) as directed. This drink was given to you during your hospital  pre-op appointment visit.  The pre-op nurse will instruct you on the time to drink the   Gatorade 2 (G2) depending on your surgery time. Color of the Gatorade may vary. Red is not allowed. Nothing else to drink after completing the  Gatorade 2 (G2).         If you have questions, please contact your surgeon's office.

## 2024-01-19 NOTE — Progress Notes (Signed)
   01/19/24 1144  Pre-op Phone Call  Surgery Date Verified 01/24/24  Arrival Time Verified 0700  Surgery Location Verified [facility]  Medical History Reviewed Yes  Is the patient taking a GLP-1 receptor agonist? No  Does the patient have diabetes? No diagnosis of diabetes  Do you have a history of heart problems? No  Does patient have other implanted devices? No  Patient Teaching Enhanced Recovery;Pre / Post Procedure  Patient educated about smoking cessation 24 hours prior to surgery. N/A Non-Smoker  Patient verbalizes understanding of bowel prep? N/A  THA/TKA patients only:  By your surgery date, will you have been taking narcotics for 90 days or greater? No  Med Rec Completed Yes  Take the Following Meds the Morning of Surgery synthroid, tansulosin  Recent  Lab Work, EKG, CXR? No  NPO (Including gum & candy) After midnight  Allowed clear liquids Water;Gatorade  (diabetics please choose diet or no sugar options)  Stop Solids, Milk, Candy, and Gum STARTING AT MIDNIGHT  Did patient view EMMI videos? No  Responsible adult to drive and be with you for 24 hours? Yes  Name & Phone Number for Ride/Caregiver blake carwell  No Jewelry, money, nail polish or make-up.  No lotions, powders, perfumes. No shaving  48 hrs. prior to surgery. Yes  Contacts, Dentures & Glasses Will Have to be Removed Before OR. Yes  Please bring your ID and Insurance Card the morning of your surgery. (Surgery Centers Only) Yes  Bring any papers or x-rays with you that your surgeon gave you. Yes  Instructed to contact the location of procedure/ provider if they or anyone in their household develops symptoms or tests positive for COVID-19, has close contact with someone who tests positive for COVID, or has known exposure to any contagious illness. Yes  Call this number the morning of surgery  with any problems that may cancel your surgery. [phone]  Covid-19 Assessment  Have you had a positive COVID-19 test within the  previous 90 days? No  COVID Testing Guidance Proceed with the additional questions.  Patient's surgery required a COVID-19 test (cardiothoracic, complex ENT, and bronchoscopies/ EBUS) No  Have you been unmasked and in close contact with anyone with COVID-19 or COVID-19 symptoms within the past 10 days? No  Do you or anyone in your household currently have any COVID-19 symptoms? No

## 2024-01-23 NOTE — Anesthesia Preprocedure Evaluation (Signed)
 Anesthesia Evaluation  Patient identified by MRN, date of birth, ID band Patient awake    Reviewed: Allergy & Precautions, NPO status , Patient's Chart, lab work & pertinent test results  Airway Mallampati: III  TM Distance: >3 FB Neck ROM: Full    Dental no notable dental hx.    Pulmonary sleep apnea and Continuous Positive Airway Pressure Ventilation , former smoker   Pulmonary exam normal        Cardiovascular hypertension, Pt. on medications Normal cardiovascular exam     Neuro/Psych  Headaches  negative psych ROS   GI/Hepatic negative GI ROS, Neg liver ROS,,,  Endo/Other  Hypothyroidism  Class 3 obesity  Renal/GU negative Renal ROS     Musculoskeletal  (+) Arthritis ,    Abdominal  (+) + obese  Peds  Hematology negative hematology ROS (+)   Anesthesia Other Findings Right middle and ring trigger finger  Reproductive/Obstetrics                             Anesthesia Physical Anesthesia Plan  ASA: 3  Anesthesia Plan: MAC   Post-op Pain Management:    Induction:   PONV Risk Score and Plan: 2 and Dexamethasone, Propofol infusion and Treatment may vary due to age or medical condition  Airway Management Planned: Simple Face Mask  Additional Equipment:   Intra-op Plan:   Post-operative Plan:   Informed Consent: I have reviewed the patients History and Physical, chart, labs and discussed the procedure including the risks, benefits and alternatives for the proposed anesthesia with the patient or authorized representative who has indicated his/her understanding and acceptance.     Dental advisory given  Plan Discussed with: CRNA  Anesthesia Plan Comments:        Anesthesia Quick Evaluation

## 2024-01-24 ENCOUNTER — Encounter (HOSPITAL_BASED_OUTPATIENT_CLINIC_OR_DEPARTMENT_OTHER)
Admission: RE | Disposition: A | Discharge status: Home / Self Care | Payer: Self-pay | Source: Ambulatory Visit | Attending: Orthopedic Surgery

## 2024-01-24 ENCOUNTER — Ambulatory Visit (HOSPITAL_BASED_OUTPATIENT_CLINIC_OR_DEPARTMENT_OTHER)
Admission: RE | Admit: 2024-01-24 | Discharge: 2024-01-24 | Disposition: A | Discharge status: Home / Self Care | Payer: Medicare Other | Source: Ambulatory Visit | Attending: Orthopedic Surgery | Admitting: Orthopedic Surgery

## 2024-01-24 ENCOUNTER — Other Ambulatory Visit: Payer: Self-pay

## 2024-01-24 ENCOUNTER — Ambulatory Visit (HOSPITAL_BASED_OUTPATIENT_CLINIC_OR_DEPARTMENT_OTHER): Payer: Medicare Other | Admitting: Anesthesiology

## 2024-01-24 ENCOUNTER — Ambulatory Visit (HOSPITAL_BASED_OUTPATIENT_CLINIC_OR_DEPARTMENT_OTHER): Payer: Self-pay | Admitting: Anesthesiology

## 2024-01-24 ENCOUNTER — Encounter (HOSPITAL_BASED_OUTPATIENT_CLINIC_OR_DEPARTMENT_OTHER): Payer: Self-pay | Admitting: Orthopedic Surgery

## 2024-01-24 DIAGNOSIS — Z6841 Body Mass Index (BMI) 40.0 and over, adult: Secondary | ICD-10-CM | POA: Insufficient documentation

## 2024-01-24 DIAGNOSIS — Z79899 Other long term (current) drug therapy: Secondary | ICD-10-CM | POA: Insufficient documentation

## 2024-01-24 DIAGNOSIS — E66813 Obesity, class 3: Secondary | ICD-10-CM | POA: Insufficient documentation

## 2024-01-24 DIAGNOSIS — G4733 Obstructive sleep apnea (adult) (pediatric): Secondary | ICD-10-CM | POA: Insufficient documentation

## 2024-01-24 DIAGNOSIS — M65341 Trigger finger, right ring finger: Secondary | ICD-10-CM

## 2024-01-24 DIAGNOSIS — Z79890 Hormone replacement therapy (postmenopausal): Secondary | ICD-10-CM | POA: Diagnosis not present

## 2024-01-24 DIAGNOSIS — I10 Essential (primary) hypertension: Secondary | ICD-10-CM

## 2024-01-24 DIAGNOSIS — M65331 Trigger finger, right middle finger: Secondary | ICD-10-CM | POA: Insufficient documentation

## 2024-01-24 DIAGNOSIS — Z87891 Personal history of nicotine dependence: Secondary | ICD-10-CM

## 2024-01-24 DIAGNOSIS — E890 Postprocedural hypothyroidism: Secondary | ICD-10-CM | POA: Diagnosis not present

## 2024-01-24 HISTORY — PX: TRIGGER FINGER RELEASE: SHX641

## 2024-01-24 SURGERY — RELEASE, A1 PULLEY, FOR TRIGGER FINGER
Anesthesia: Monitor Anesthesia Care | Site: Hand | Laterality: Right

## 2024-01-24 MED ORDER — AMISULPRIDE (ANTIEMETIC) 5 MG/2ML IV SOLN: 10.0000 mg | Freq: Once | INTRAVENOUS | Status: DC | PRN

## 2024-01-24 MED ORDER — ONDANSETRON HCL 4 MG/2ML IJ SOLN
INTRAMUSCULAR | Status: DC | PRN
  Administered 2024-01-24: 4 mg via INTRAVENOUS

## 2024-01-24 MED ORDER — ACETAMINOPHEN 500 MG PO TABS
1000.0000 mg | ORAL_TABLET | Freq: Once | ORAL | Status: AC
  Administered 2024-01-24: 1000 mg via ORAL

## 2024-01-24 MED ORDER — PROPOFOL 500 MG/50ML IV EMUL
INTRAVENOUS | Status: AC
  Filled 2024-01-24: qty 50

## 2024-01-24 MED ORDER — CEFAZOLIN SODIUM-DEXTROSE 2-4 GM/100ML-% IV SOLN
2.0000 g | INTRAVENOUS | Status: AC
  Administered 2024-01-24: 3 g via INTRAVENOUS

## 2024-01-24 MED ORDER — PROPOFOL 10 MG/ML IV BOLUS
INTRAVENOUS | Status: AC
  Filled 2024-01-24: qty 20

## 2024-01-24 MED ORDER — LIDOCAINE 2% (20 MG/ML) 5 ML SYRINGE
INTRAMUSCULAR | Status: AC
  Filled 2024-01-24: qty 5

## 2024-01-24 MED ORDER — ONDANSETRON HCL 4 MG/2ML IJ SOLN: 4.0000 mg | Freq: Once | INTRAMUSCULAR | Status: DC | PRN

## 2024-01-24 MED ORDER — PROPOFOL 500 MG/50ML IV EMUL
INTRAVENOUS | Status: DC | PRN
  Administered 2024-01-24: 50 ug/kg/min via INTRAVENOUS

## 2024-01-24 MED ORDER — DEXAMETHASONE SODIUM PHOSPHATE 10 MG/ML IJ SOLN
INTRAMUSCULAR | Status: AC
  Filled 2024-01-24: qty 1

## 2024-01-24 MED ORDER — FENTANYL CITRATE (PF) 100 MCG/2ML IJ SOLN
INTRAMUSCULAR | Status: DC | PRN
  Administered 2024-01-24 (×2): 25 ug via INTRAVENOUS

## 2024-01-24 MED ORDER — LACTATED RINGERS IV SOLN: INTRAVENOUS | Status: DC

## 2024-01-24 MED ORDER — ACETAMINOPHEN 500 MG PO TABS
ORAL_TABLET | ORAL | Status: AC
  Filled 2024-01-24: qty 2

## 2024-01-24 MED ORDER — FENTANYL CITRATE (PF) 100 MCG/2ML IJ SOLN: 25.0000 ug | INTRAMUSCULAR | Status: DC | PRN

## 2024-01-24 MED ORDER — CEFAZOLIN SODIUM-DEXTROSE 3-4 GM/150ML-% IV SOLN
INTRAVENOUS | Status: AC
  Filled 2024-01-24: qty 150

## 2024-01-24 MED ORDER — FENTANYL CITRATE (PF) 100 MCG/2ML IJ SOLN
INTRAMUSCULAR | Status: AC
  Filled 2024-01-24: qty 2

## 2024-01-24 MED ORDER — BUPIVACAINE HCL 0.25 % IJ SOLN
INTRAMUSCULAR | Status: DC | PRN
  Administered 2024-01-24: 10 mL

## 2024-01-24 MED ORDER — OXYCODONE HCL 5 MG PO TABS: 5.0000 mg | ORAL_TABLET | Freq: Four times a day (QID) | ORAL | 0 refills | Status: AC | PRN

## 2024-01-24 MED ORDER — BUPIVACAINE HCL (PF) 0.25 % IJ SOLN
INTRAMUSCULAR | Status: AC
  Filled 2024-01-24: qty 30

## 2024-01-24 MED ORDER — LIDOCAINE HCL (CARDIAC) PF 100 MG/5ML IV SOSY
PREFILLED_SYRINGE | INTRAVENOUS | Status: DC | PRN
  Administered 2024-01-24: 40 mg via INTRAVENOUS

## 2024-01-24 MED ORDER — 0.9 % SODIUM CHLORIDE (POUR BTL) OPTIME
TOPICAL | Status: DC | PRN
  Administered 2024-01-24: 1000 mL

## 2024-01-24 MED ORDER — ONDANSETRON HCL 4 MG/2ML IJ SOLN
INTRAMUSCULAR | Status: AC
  Filled 2024-01-24: qty 2

## 2024-01-24 MED ORDER — SODIUM CHLORIDE 0.9 % IV SOLN: INTRAVENOUS | Status: DC | PRN

## 2024-01-24 MED ORDER — LIDOCAINE-EPINEPHRINE 1 %-1:100000 IJ SOLN
INTRAMUSCULAR | Status: AC
Start: 2024-01-24 — End: ?
  Filled 2024-01-24: qty 1

## 2024-01-24 MED ORDER — MIDAZOLAM HCL 2 MG/2ML IJ SOLN
INTRAMUSCULAR | Status: AC
  Filled 2024-01-24: qty 2

## 2024-01-24 SURGICAL SUPPLY — 27 items
BLADE SURG 15 STRL LF DISP TIS (BLADE) ×1 IMPLANT
BNDG ELASTIC 2INX 5YD STR LF (GAUZE/BANDAGES/DRESSINGS) ×1 IMPLANT
BNDG ESMARK 4X9 LF (GAUZE/BANDAGES/DRESSINGS) ×1 IMPLANT
CHLORAPREP W/TINT 26 (MISCELLANEOUS) ×1 IMPLANT
CORD BIPOLAR FORCEPS 12FT (ELECTRODE) ×1 IMPLANT
COVER BACK TABLE 60X90IN (DRAPES) ×1 IMPLANT
CUFF TOURN SGL QUICK 18X4 (TOURNIQUET CUFF) IMPLANT
CUFF TRNQT CYL 24X4X16.5-23 (TOURNIQUET CUFF) IMPLANT
DRAPE EXTREMITY T 121X128X90 (DISPOSABLE) ×1 IMPLANT
DRAPE SURG 17X23 STRL (DRAPES) ×1 IMPLANT
GAUZE SPONGE 4X4 12PLY STRL (GAUZE/BANDAGES/DRESSINGS) IMPLANT
GAUZE XEROFORM 1X8 LF (GAUZE/BANDAGES/DRESSINGS) ×1 IMPLANT
GLOVE BIO SURGEON STRL SZ7 (GLOVE) ×1 IMPLANT
GLOVE BIOGEL PI IND STRL 7.0 (GLOVE) ×1 IMPLANT
GOWN STRL REUS W/ TWL LRG LVL3 (GOWN DISPOSABLE) ×2 IMPLANT
NDL HYPO 25X1 1.5 SAFETY (NEEDLE) ×1 IMPLANT
NEEDLE HYPO 25X1 1.5 SAFETY (NEEDLE) ×1 IMPLANT
NS IRRIG 1000ML POUR BTL (IV SOLUTION) ×1 IMPLANT
PACK BASIN DAY SURGERY FS (CUSTOM PROCEDURE TRAY) ×1 IMPLANT
SHEET MEDIUM DRAPE 40X70 STRL (DRAPES) ×1 IMPLANT
SUT ETHILON 4 0 PS 2 18 (SUTURE) ×1 IMPLANT
SUT VIC AB 4-0 PS2 18 (SUTURE) IMPLANT
SYR 10ML LL (SYRINGE) IMPLANT
SYR BULB EAR ULCER 3OZ GRN STR (SYRINGE) ×1 IMPLANT
SYR CONTROL 10ML LL (SYRINGE) ×1 IMPLANT
TOWEL GREEN STERILE FF (TOWEL DISPOSABLE) ×1 IMPLANT
UNDERPAD 30X36 HEAVY ABSORB (UNDERPADS AND DIAPERS) ×1 IMPLANT

## 2024-01-24 NOTE — Anesthesia Procedure Notes (Signed)
 Procedure Name: MAC Date/Time: 01/24/2024 8:35 AM  Performed by: Jessica Priest, CRNAPre-anesthesia Checklist: Timeout performed, Patient being monitored, Suction available, Emergency Drugs available and Patient identified Patient Re-evaluated:Patient Re-evaluated prior to induction Oxygen Delivery Method: Simple face mask Preoxygenation: Pre-oxygenation with 100% oxygen Induction Type: IV induction Placement Confirmation: breath sounds checked- equal and bilateral, CO2 detector and positive ETCO2

## 2024-01-24 NOTE — H&P (Signed)
 HAND SURGERY   HPI: Patient is a 78 y.o. female who presents with stenosing tenosynovitis involving the right middle and ring fingers.  She has failed conservative management.  Patient denies any changes to their medical history or new systemic symptoms today.    Past Medical History:  Diagnosis Date   Acquired solitary kidney    right side 2014   Arthritis    rt knee rt shoulder   Bronchitis 05/08/2023   Cancer (HCC)    renal cell carcinoma- left kidney removed   Complication of anesthesia    slow to wake, hard to take a deep breath with 1 surgery several yrs ago   Frequency of urination    Headache    Heart murmur    per told by pcp approx. 02/ 2022 heard a very faint murmur, told did need work-up done at this time   History of COVID-19 2021   all symptoms resolved   History of Hashimoto thyroiditis    s/p  total thyroidectomy 1995   History of hypercalcemia    s/p  parathryoidectomy 07/ 2021   History of renal cell carcinoma 2014   s/p  left nephrectomy, pt stated no other treatment   Hypertension    followed by pcp   Hypothyroidism, postsurgical 1995   followed by pcp in [place], moved from [place] 01/ 2021   OSA on CPAP    Thickened endometrium    UTI (urinary tract infection)    finished keflex 08-12-2022   Wears glasses    Past Surgical History:  Procedure Laterality Date   BREAST BIOPSY Left 08/24/2021   BREAST LUMPECTOMY WITH RADIOACTIVE SEED LOCALIZATION Left 07/14/2022   Procedure: LEFT BREAST LUMPECTOMY WITH RADIOACTIVE SEED LOCALIZATION;  Surgeon: Emelia Loron, MD;  Location: [facility];  Service: General;  Laterality: Left;   CYSTOSCOPY  08/04/2022   in dr winter office   CYSTOSCOPY WITH BIOPSY N/A 09/02/2022   Procedure: CYSTOSCOPY WITH BLADDER BIOPSY;  Surgeon: Rene Paci, MD;  Location: [facility];  Service: Urology;  Laterality: N/A;  ONLY NEEDS 30 MIN   HYSTEROSCOPY WITH D & C N/A 04/08/2021    Procedure: DILATATION AND CURETTAGE /HYSTEROSCOPY, EXCISION OF VAGINAL LESION;  Surgeon: Silverio Lay, MD;  Location: [facility];  Service: Gynecology;  Laterality: N/A;   LAPAROSCOPIC CHOLECYSTECTOMY  2005   NEPHRECTOMY Left 2014   PARATHYROIDECTOMY  07/ 2021  in [place]   REVERSE SHOULDER ARTHROPLASTY Left 06/16/2023   Procedure: REVERSE SHOULDER ARTHROPLASTY;  Surgeon: Beverely Low, MD;  Location: [facility];  Service: Orthopedics;  Laterality: Left;   TONSILLECTOMY  age 77   TOTAL HIP ARTHROPLASTY Bilateral left 2015;  right 2009   TOTAL KNEE ARTHROPLASTY Left 2014   TOTAL THYROIDECTOMY Bilateral 1995   WISDOM TOOTH EXTRACTION Bilateral    Social History   Socioeconomic History   Marital status: Widowed    Spouse name: Not on file   Number of children: Not on file   Years of education: Not on file   Highest education level: Not on file  Occupational History   Not on file  Tobacco Use   Smoking status: Former    Current packs/day: 0.00    Average packs/day: 1.5 packs/day for 12.0 years (18.0 ttl pk-yrs)    Types: Cigarettes    Start date: 04/02/1962    Quit date: 04/02/1974    Years since quitting: 49.8   Smokeless tobacco: Never  Vaping Use   Vaping status:  Never Used  Substance and Sexual Activity   Alcohol use: Yes    Comment: occasional   Drug use: Never   Sexual activity: Not Currently    Birth control/protection: Post-menopausal  Other Topics Concern   Not on file  Social History Narrative   Not on file   Social Drivers of Health   Financial Resource Strain: Low Risk  (01/28/2023)   Received from [facility], [facility]   Overall Financial Resource Strain (CARDIA)    Difficulty of Paying Living Expenses: Not very hard  Food Insecurity: No Food Insecurity (06/17/2023)   Hunger Vital Sign    Worried About Running Out of Food in the Last Year: Never true    Ran Out of Food in the Last Year: Never true  Transportation Needs: No  Transportation Needs (06/17/2023)   PRAPARE - Administrator, Civil Service (Medical): No    Lack of Transportation (Non-Medical): No  Physical Activity: Inactive (08/21/2022)   Received from [facility], [facility]   Exercise Vital Sign    Days of Exercise per Week: 0 days    Minutes of Exercise per Session: 0 min  Stress: Stress Concern Present (01/28/2023)   Received from [facility], [facility] - Occupational Stress Questionnaire    Feeling of Stress : To some extent  Social Connections: Unknown (08/26/2023)   Received from [facility]   Social Network    Social Network: Not on file   Family History  Problem Relation Age of Onset   Melanoma Father    Prostate cancer Father    - negative except otherwise stated in the family history section Allergies  Allergen Reactions   Wound Dressing Adhesive Rash    Adhesive tape causes rash, can use papee tape   Tape Rash and Other (See Comments)    "Blistery rash" DERMABOND   Prior to Admission medications   Medication Sig Start Date End Date Taking? Authorizing Provider  acetaminophen (TYLENOL) 500 MG tablet Take 2 tablets (1,000 mg total) by mouth every 6 (six) hours as needed. 04/08/21  Yes Rivard, Dois Davenport, MD  Ascorbic Acid (VITAMIN C PO) Take 1 tablet by mouth daily.   Yes [provider]  Cholecalciferol (VITAMIN D3 SUPER STRENGTH) 50 MCG (2000 UT) CAPS Take 2,000 Units by mouth daily.   Yes [provider]  Ciprofloxacin (CIPRO PO) Take by mouth.   Yes [provider]  CRANBERRY PO Take 2 capsules by mouth daily.   Yes [provider]  D-MANNOSE PO Take 4 capsules by mouth daily.   Yes [provider]  letrozole (FEMARA) 2.5 MG tablet Take 1 tablet (2.5 mg total) by mouth daily. 01/09/23  Yes Serena Croissant, MD  levothyroxine (SYNTHROID) 150 MCG tablet Take 150 mcg by mouth daily before breakfast.   Yes [provider]   losartan (COZAAR) 50 MG tablet Take 50 mg by mouth daily.   Yes [provider]  Probiotic Product (PROBIOTIC DAILY PO) Take 1 capsule by mouth daily.   Yes [provider]  simvastatin (ZOCOR) 10 MG tablet Take 10 mg by mouth at bedtime.   Yes [provider]  tamsulosin (FLOMAX) 0.4 MG CAPS capsule Take 1 capsule (0.4 mg total) by mouth daily. Patient taking differently: Take 0.4 mg by mouth daily after breakfast. 03/10/22  Yes Serena Croissant, MD  albuterol (VENTOLIN HFA) 108 (90 Base) MCG/ACT inhaler Inhale 2 puffs into the lungs every 4 (  four) hours as needed for shortness of breath or wheezing. 05/09/22   [provider]  clotrimazole (LOTRIMIN) 1 % cream Apply 1 application  topically 2 (two) times daily as needed (irritation).    [provider]  erythromycin ophthalmic ointment Place 1 application  into both eyes daily as needed (blepharitis).    [provider]  HYDROcodone-acetaminophen (NORCO/VICODIN) 5-325 MG tablet Take 1-2 tablets by mouth every 6 (six) hours as needed for moderate pain or severe pain. 06/16/23   Beverely Low, MD  hydrocortisone 2.5 % cream Apply 1 Application topically 2 (two) times daily as needed (irritation).    [provider]  methocarbamol (ROBAXIN) 500 MG tablet Take 1 tablet (500 mg total) by mouth every 8 (eight) hours as needed for muscle spasms. 06/16/23   Beverely Low, MD  NON FORMULARY Pt uses a cpap nightly    [provider]  ondansetron (ZOFRAN) 4 MG tablet Take 1 tablet (4 mg total) by mouth every 8 (eight) hours as needed for vomiting, nausea or refractory nausea / vomiting. 06/16/23 06/15/24  Beverely Low, MD  oxybutynin (DITROPAN) 5 MG tablet Take 1 tablet (5 mg total) by mouth every 8 (eight) hours as needed for bladder spasms. 09/02/22   Rene Paci, MD  vitamin B-12 (CYANOCOBALAMIN) 1000 MCG tablet Take 1,000 mcg by mouth daily.    [provider]   No  results found. - Positive ROS: All other systems have been reviewed and were otherwise negative with the exception of those mentioned in the HPI and as above.  Physical Exam: General: No acute distress, resting comfortably Cardiovascular: BUE warm and well perfused, normal rate Respiratory: Normal WOB on RA Skin: Warm and dry Neurologic: Sensation intact distally Psychiatric: Patient is at baseline mood and affect  Right upper Extremity  Tender to palpation over the middle and ring finger A1 pulleys.  There is palpable or visible locking/catching of the digits with active range of motion.  She has full painless range of motion of the remaining fingers.  Sensation is intact light touch throughout the hand.  Her hand is warm and well-perfused with brisk capillary refill.   Assessment: 78 year old female with stenosing tenosynovitis of the right middle and ring fingers  Plan: OR today for right middle and ring finger A1 pulley releases. We again reviewed the risks of surgery which include bleeding, infection, damage to neurovascular structures, persistent symptoms, finger stiffness, need for additional surgery.  Informed consent was signed.  All questions were answered.   Marlyne Beards, M.D. [facility] 8:28 AM

## 2024-01-24 NOTE — Discharge Instructions (Addendum)
 Waylan Rocher, M.D. Hand Surgery  POST-OPERATIVE DISCHARGE INSTRUCTIONS   PRESCRIPTIONS: - You may have been given a prescription to be taken as directed for post-operative pain control.  You may also take over the counter ibuprofen/aleve and tylenol for pain. Take this as directed on the packaging. Do not exceed 3000 mg tylenol/acetaminophen in 24 hours.  Ibuprofen 600-800 mg (3-4) tablets by mouth every 6 hours as needed for pain.   OR  Aleve 2 tablets by mouth every 12 hours (twice daily) as needed for pain.   AND/OR  Tylenol 1000 mg (2 tablets) every 8 hours as needed for pain.  - Please use your pain medication carefully, as refills are limited and you may not be provided with one.  As stated above, please use over the counter pain medicine - it will also be helpful with decreasing your swelling.    ANESTHESIA: -After your surgery, post-surgical discomfort or pain is likely. This discomfort can last several days to a few weeks. At certain times of the day your discomfort may be more intense.   Did you receive a nerve block?   - A nerve block can provide pain relief for one hour to two days after your surgery. As long as the nerve block is working, you will experience little or no sensation in the area the surgeon operated on.  - As the nerve block wears off, you will begin to experience pain or discomfort. It is very important that you begin taking your prescribed pain medication before the nerve block fully wears off. Treating your pain at the first sign of the block wearing off will ensure your pain is better controlled and more tolerable when full-sensation returns. Do not wait until the pain is intolerable, as the medicine will be less effective. It is better to treat pain in advance than to try and catch up.   General Anesthesia:  If you did not receive a nerve block during your surgery, you will need to start taking your pain medication shortly after your surgery and  should continue to do so as prescribed by your surgeon.     ICE AND ELEVATION: - You may use ice for the first 48-72 hours, but it is not critical.   - Motion of your fingers is very important to decrease the swelling.  - Elevation, as much as possible for the next 48 hours, is critical for decreasing swelling as well as for pain relief. Elevation means when you are seated or lying down, you hand should be at or above your heart. When walking, the hand needs to be at or above the level of your elbow.  - If the bandage gets too tight, it may need to be loosened. Please contact our office and we will instruct you in how to do this.    SURGICAL BANDAGES:  - Keep your dressing and/or splint clean and dry at all times.  You can remove your dressing 7 days from now and change with a dry dressing or Band-Aids as needed thereafter. - You may place a plastic bag over your bandage to shower, but be careful, do not get your bandages wet.  - After the bandages have been removed, it is OK to get the stitches wet in a shower or with hand washing. Do Not soak or submerge the wound yet. Please do not use lotions or creams on the stitches.      HAND THERAPY:  - You may not need any. If you  do, we will begin this at your follow up visit in the clinic.    ACTIVITY AND WORK: - You are encouraged to move any fingers which are not in the bandage.  - Light use of the fingers is allowed to assist the other hand with daily hygiene and eating, but strong gripping or lifting is often uncomfortable and should be avoided.  - You might miss a variable period of time from work and hopefully this issue has been discussed prior to surgery. You may not do any heavy work with your affected hand for about 2 weeks.    [facility] Second Floor, [address] [place], [place] [address] [phone]      Post Anesthesia Home Care Instructions  Activity: Get plenty of rest for the remainder of the day. A  responsible individual must stay with you for 24 hours following the procedure.  For the next 24 hours, DO NOT: -Drive a car -Advertising copywriter -Drink alcoholic beverages -Take any medication unless instructed by your physician -Make any legal decisions or sign important papers.  Meals: Start with liquid foods such as gelatin or soup. Progress to regular foods as tolerated. Avoid greasy, spicy, heavy foods. If nausea and/or vomiting occur, drink only clear liquids until the nausea and/or vomiting subsides. Call your physician if vomiting continues.  Special Instructions/Symptoms: Your throat may feel dry or sore from the anesthesia or the breathing tube placed in your throat during surgery. If this causes discomfort, gargle with warm salt water. The discomfort should disappear within 24 hours.  If you had a scopolamine patch placed behind your ear for the management of post- operative nausea and/or vomiting:  1. The medication in the patch is effective for 72 hours, after which it should be removed.  Wrap patch in a tissue and discard in the trash. Wash hands thoroughly with soap and water. 2. You may remove the patch earlier than 72 hours if you experience unpleasant side effects which may include dry mouth, dizziness or visual disturbances. 3. Avoid touching the patch. Wash your hands with soap and water after contact with the patch.      Next dose of Tylenol may be given at 1:15pm if needed.

## 2024-01-24 NOTE — Transfer of Care (Signed)
 Immediate Anesthesia Transfer of Care Note  Patient: Brooke Duncan  Procedure(s) Performed: Procedure(s) (LRB): RELEASE TRIGGER FINGER/A-1 PULLEY MIDDLE AND RING (Right)  Patient Location: PACU  Anesthesia Type: MAC  Level of Consciousness: awake, sedated, patient cooperative and responds to stimulation, c/o pain in back - comfort measures given w/ medication   Airway & Oxygen Therapy: Patient Spontanous Breathing and Patient connected to [place] oxygen  Post-op Assessment: Report given to PACU RN, Post -op Vital signs reviewed and stable and Patient moving all extremities  Post vital signs: Reviewed and stable  Complications: No apparent anesthesia complications

## 2024-01-24 NOTE — Op Note (Signed)
   Date of Surgery: 01/24/2024  INDICATIONS: Patient is a 78 y.o.-year-old female with right middle and ring finger stenosing tenosynovitis that has failed conservative management.  Risks, benefits, and alternatives to surgery were again discussed with the patient in the preoperative area. The patient wishes to proceed with surgery.  Informed consent was signed after our discussion.   PREOPERATIVE DIAGNOSIS:  Right middle trigger finer Right ring trigger finger  POSTOPERATIVE DIAGNOSIS: Same.  PROCEDURE:  Right middle finger A1 pulley release Right ring finger A1 pulley release   SURGEON: Waylan Rocher, M.D.  ASSIST: None  ANESTHESIA:  Local, MAC  IV FLUIDS AND URINE: See anesthesia.  ESTIMATED BLOOD LOSS: <5 mL.  IMPLANTS: * No implants in log *   DRAINS: None  COMPLICATIONS: None  DESCRIPTION OF PROCEDURE:  The patient was met in the preoperative holding area where the surgical site was marked and the informed consent form was signed.  The patient was then brought back to the operating room and remained on the stretcher.  A hand table was placed adjacent to the operative extremity and locked into place.  A tourniquet was placed on the right forearm.  A formal timeout was performed to confirm that this was the correct patient, surgical side, surgical site, and surgical procedure.  All were present and in agreement. Following formal timeout, a local block of the middle and ring fingers was performed using 10 mL of 0.25% plain marcaine.  The right upper extremity was then prepped and draped in the usual and sterile fashion.    Following a second formal timeout, the limb was exsanguinated and the tourniquet inflated to 250 mmHg.  I began with the middle finger.  A longitudinal incision was made over the A1 pulley.  The skin was incised.  Blunt dissection was used to identify the A1 pulley.  Two Ragnell retractors were placed on the radial and ulnar sides of the pulley to protect  the respective neurovascular bundles.  A third Ragnell was placed at the distal aspect of the wound.  The A1 pulley was clearly identified.  Under direct visualization, the pulley was entered sharply using a 15 blade.  Tenotomy scissors were used to complete the pulley release distally to the level of the A2 pulley.  The distal retractor was then placed in the proximal aspect of the wound.  Under direct visualization, the proximal aspect of the A1 pulley was completely released. The same process was repeated for the ring finger.  Following satisfactory A1 pulley release, the patient was reversed from sedation and asked to fully flex and extend the involved fingers.  There was no catching or triggering present.  The tourniquet was let down and hemostasis achieved with direct pressure and bipolar electrocautery.  The wounds were then thoroughly irrigated.  They were closed using 4-0 nylon sutures in a horizontal mattress fashion.  The wounds were dressed with xeroform, 4x4, and an ace wrap.   All counts were correct x 2 at the end of the procedure.  The patient was then taken to the PACU in stable condition.   POSTOPERATIVE PLAN: She will be discharged to home with appropriate pain medication and discharge instructions.  I'll see her back in 10-14 days for her first postop visit.   Waylan Rocher, MD 9:10 AM

## 2024-01-24 NOTE — Anesthesia Postprocedure Evaluation (Signed)
 Anesthesia Post Note  Patient: Brooke Duncan  Procedure(s) Performed: RELEASE TRIGGER FINGER/A-1 PULLEY MIDDLE AND RING (Right: Hand)     Patient location during evaluation: PACU Anesthesia Type: MAC Level of consciousness: awake Pain management: pain level controlled Vital Signs Assessment: post-procedure vital signs reviewed and stable Respiratory status: spontaneous breathing, nonlabored ventilation and respiratory function stable Cardiovascular status: blood pressure returned to baseline and stable Postop Assessment: no apparent nausea or vomiting Anesthetic complications: no   No notable events documented.  Last Vitals:  Vitals:   01/24/24 0928 01/24/24 0940  BP: 135/81 (!) 145/77  Pulse: 66 66  Resp: 16 16  Temp:  (!) 36.2 C  SpO2: 94% 97%    Last Pain:  Vitals:   01/24/24 0940  TempSrc: Temporal  PainSc: 0-No pain                 Shoshannah Faubert P Onesha Krebbs

## 2024-01-24 NOTE — Interval H&P Note (Signed)
 History and Physical Interval Note:  01/24/2024 8:29 AM  Brooke Duncan  has presented today for surgery, with the diagnosis of Right middle and ring trigger finger.  The various methods of treatment have been discussed with the patient and family. After consideration of risks, benefits and other options for treatment, the patient has consented to  Procedure(s) with comments: RELEASE TRIGGER FINGER/A-1 PULLEY MIDDLE AND RING (Right) - local as a surgical intervention.  The patient's history has been reviewed, patient examined, no change in status, stable for surgery.  I have reviewed the patient's chart and labs.  Questions were answered to the patient's satisfaction.     Doctor Sheahan

## 2024-01-25 ENCOUNTER — Encounter (HOSPITAL_BASED_OUTPATIENT_CLINIC_OR_DEPARTMENT_OTHER): Payer: Self-pay | Admitting: Orthopedic Surgery

## 2024-04-03 ENCOUNTER — Encounter: Payer: Self-pay | Admitting: Hematology and Oncology

## 2024-04-03 ENCOUNTER — Other Ambulatory Visit: Payer: Self-pay | Admitting: *Deleted

## 2024-04-03 MED ORDER — LETROZOLE 2.5 MG PO TABS: 2.5000 mg | ORAL_TABLET | Freq: Every day | ORAL | 3 refills | Status: AC

## 2024-06-17 NOTE — Progress Notes (Signed)
 Surgery orders requested via Epic inbox.

## 2024-06-18 NOTE — Progress Notes (Signed)
 Request sent to Dr. CANDIE Her to send pre op orders for PST appointment.

## 2024-06-18 NOTE — Progress Notes (Signed)
 COVID Vaccine received:  []  No []  Yes Date of any COVID positive Test in last 90 days:  PCP - Dr. Carlin Gull Cardiologist -   Chest x-ray -  EKG -  01/19/24 Epic Stress Test -  ECHO - 04/01/24 [facility] Cardiac Cath -   Bowel Prep - []  No  []   Yes ______  Pacemaker / ICD device []  No []  Yes   Spinal Cord Stimulator:[]  No []  Yes       History of Sleep Apnea? []  No []  Yes   CPAP used?- []  No []  Yes    Does the patient monitor blood sugar?          []  No []  Yes  []  N/A  Patient has: []  NO Hx DM   []  Pre-DM                 []  DM1  []   DM2 Does patient have a [employer] or Dexacom? []  No []  Yes   Fasting Blood Sugar Ranges-  Checks Blood Sugar _____ times a day  GLP1 agonist / usual dose -  GLP1 instructions:  SGLT-2 inhibitors / usual dose -  SGLT-2 instructions:   Blood Thinner / Instructions: Aspirin Instructions:  Comments:   Activity level: Patient is able / unable to climb a flight of stairs without difficulty; []  No CP  []  No SOB, but would have ___   Patient can / can not perform ADLs without assistance.   Anesthesia review:   Patient denies shortness of breath, fever, cough and chest pain at PAT appointment.  Patient verbalized understanding and agreement to the Pre-Surgical Instructions that were given to them at this PAT appointment. Patient was also educated of the need to review these PAT instructions again prior to his/her surgery.I reviewed the appropriate phone numbers to call if they have any and questions or concerns.

## 2024-06-18 NOTE — Patient Instructions (Signed)
 SURGICAL WAITING ROOM VISITATION  Patients having surgery or a procedure may have no more than 2 support people in the waiting area - these visitors may rotate.    Children under the age of 36 must have an adult with them who is not the patient.  Visitors with respiratory illnesses are discouraged from visiting and should remain at home.  If the patient needs to stay at the hospital during part of their recovery, the visitor guidelines for inpatient rooms apply. Pre-op nurse will coordinate an appropriate time for 1 support person to accompany patient in pre-op.  This support person may not rotate.    Please refer to the [facility] website for the visitor guidelines for Inpatients (after your surgery is over and you are in a regular room).       Your procedure is scheduled on: 07/05/24   Report to [facility] Main Entrance    Report to admitting at 11 AM   Call this number if you have problems the morning of surgery [phone]   Do not eat food :After Midnight.   After Midnight you may have the following liquids until ______ AM/ PM DAY OF SURGERY  Water  Non-Citrus Juices (without pulp, NO RED-Apple, White grape, White cranberry) Black Coffee (NO MILK/CREAM OR CREAMERS, sugar ok)  Clear Tea (NO MILK/CREAM OR CREAMERS, sugar ok) regular and decaf                             Plain Jell-O (NO RED)                                           Fruit ices (not with fruit pulp, NO RED)                                     Popsicles (NO RED)                                                               Sports drinks like Gatorade (NO RED)                  The day of surgery:  Drink ONE (1) Pre-Surgery Clear Ensure at AM the morning of surgery. Drink in one sitting. Do not sip.  This drink was given to you during your hospital  pre-op appointment visit. Nothing else to drink after completing the  Pre-Surgery Clear Ensure    Oral Hygiene is also important to reduce your risk  of infection.                                    Remember - BRUSH YOUR TEETH THE MORNING OF SURGERY WITH YOUR REGULAR TOOTHPASTE  DENTURES WILL BE REMOVED PRIOR TO SURGERY PLEASE DO NOT APPLY Poly grip OR ADHESIVES!!!   Stop all vitamins and herbal supplements 7 days before surgery.   Take these medicines the morning of surgery with A SIP OF WATER : tylenol , femara (letrozole ), levothyroxine , Simvastatin , Tamsulosin .  Bring CPAP mask and tubing day of surgery.                              You may not have any metal on your body including hair pins, jewelry, and body piercing             Do not wear make-up, lotions, powders, perfumes/cologne, or deodorant  Do not wear nail polish including gel and S&S, artificial/acrylic nails, or any other type of covering on natural nails including finger and toenails. If you have artificial nails, gel coating, etc. that needs to be removed by a nail salon please have this removed prior to surgery or surgery may need to be canceled/ delayed if the surgeon/ anesthesia feels like they are unable to be safely monitored.   Do not shave  48 hours prior to surgery.    Do not bring valuables to the hospital. [facility] IS NOT             RESPONSIBLE   FOR VALUABLES.   Contacts, glasses, dentures or bridgework may not be worn into surgery.   Bring small overnight bag day of surgery.   DO NOT BRING YOUR HOME MEDICATIONS TO THE HOSPITAL. PHARMACY WILL DISPENSE MEDICATIONS LISTED ON YOUR MEDICATION LIST TO YOU DURING YOUR ADMISSION IN THE HOSPITAL!    Patients discharged on the day of surgery will not be allowed to drive home.  Someone NEEDS to stay with you for the first 24 hours after anesthesia.   Special Instructions: Bring a copy of your healthcare power of attorney and living will documents the day of surgery if you haven't scanned them before.              Please read over the following fact sheets you were given: IF YOU HAVE QUESTIONS ABOUT YOUR  PRE-OP INSTRUCTIONS PLEASE CALL [phone] [place]   If you received a COVID test during your pre-op visit  it is requested that you wear a mask when out in public, stay away from anyone that may not be feeling well and notify your surgeon if you develop symptoms. If you test positive for Covid or have been in contact with anyone that has tested positive in the last 10 days please notify you surgeon.      Pre-operative 5 CHG Bath Instructions   You can play a key role in reducing the risk of infection after surgery. Your skin needs to be as free of germs as possible. You can reduce the number of germs on your skin by washing with CHG (chlorhexidine  gluconate) soap before surgery. CHG is an antiseptic soap that kills germs and continues to kill germs even after washing.   DO NOT use if you have an allergy to chlorhexidine /CHG or antibacterial soaps. If your skin becomes reddened or irritated, stop using the CHG and notify one of our RNs at [phone].   Please shower with the CHG soap starting 4 days before surgery using the following schedule:     Please keep in mind the following:  DO NOT shave, including legs and underarms, starting the day of your first shower.   You may shave your face at any point before/day of surgery.  Place clean sheets on your bed the day you start using CHG soap. Use a clean washcloth (not used since being washed) for each shower. DO NOT sleep with pets once you start using the CHG.   CHG  Shower Instructions:  If you choose to wash your hair and private area, wash first with your normal shampoo/soap.  After you use shampoo/soap, rinse your hair and body thoroughly to remove shampoo/soap residue.  Turn the water  OFF and apply about 3 tablespoons (45 ml) of CHG soap to a CLEAN washcloth.  Apply CHG soap ONLY FROM YOUR NECK DOWN TO YOUR TOES (washing for 3-5 minutes)  DO NOT use CHG soap on face, private areas, open wounds, or sores.  Pay special attention to  the area where your surgery is being performed.  If you are having back surgery, having someone wash your back for you may be helpful. Wait 2 minutes after CHG soap is applied, then you may rinse off the CHG soap.  Pat dry with a clean towel  Put on clean clothes/pajamas   If you choose to wear lotion, please use ONLY the CHG-compatible lotions on the back of this paper.     Additional instructions for the day of surgery: DO NOT APPLY any lotions, deodorants, cologne, or perfumes.   Put on clean/comfortable clothes.  Brush your teeth.  Ask your nurse before applying any prescription medications to the skin.      CHG Compatible Lotions   Aveeno Moisturizing lotion  Cetaphil Moisturizing Cream  Cetaphil Moisturizing Lotion  Clairol Herbal Essence Moisturizing Lotion, Dry Skin  Clairol Herbal Essence Moisturizing Lotion, Extra Dry Skin  Clairol Herbal Essence Moisturizing Lotion, Normal Skin  Curel Age Defying Therapeutic Moisturizing Lotion with Alpha Hydroxy  Curel Extreme Care Body Lotion  Curel Soothing Hands Moisturizing Hand Lotion  Curel Therapeutic Moisturizing Cream, Fragrance-Free  Curel Therapeutic Moisturizing Lotion, Fragrance-Free  Curel Therapeutic Moisturizing Lotion, Original Formula  Eucerin Daily Replenishing Lotion  Eucerin Dry Skin Therapy Plus Alpha Hydroxy Crme  Eucerin Dry Skin Therapy Plus Alpha Hydroxy Lotion  Eucerin Original Crme  Eucerin Original Lotion  Eucerin Plus Crme Eucerin Plus Lotion  Eucerin TriLipid Replenishing Lotion  Keri Anti-Bacterial Hand Lotion  Keri Deep Conditioning Original Lotion Dry Skin Formula Softly Scented  Keri Deep Conditioning Original Lotion, Fragrance Free Sensitive Skin Formula  Keri Lotion Fast Absorbing Fragrance Free Sensitive Skin Formula  Keri Lotion Fast Absorbing Softly Scented Dry Skin Formula  Keri Original Lotion  Keri Skin Renewal Lotion Keri Silky Smooth Lotion  Keri Silky Smooth Sensitive Skin  Lotion  Nivea Body Creamy Conditioning Oil  Nivea Body Extra Enriched Teacher, adult education Moisturizing Lotion Nivea Crme  Nivea Skin Firming Lotion  NutraDerm 30 Skin Lotion  NutraDerm Skin Lotion  NutraDerm Therapeutic Skin Cream  NutraDerm Therapeutic Skin Lotion  ProShield Protective Hand Cream  Provon moisturizing lotion

## 2024-06-19 NOTE — H&P (Signed)
 Patient's anticipated LOS is less than 2 midnights, meeting these requirements: - Younger than 93 - Lives within 1 hour of care - Has a competent adult at home to recover with post-op recover - NO history of  - Chronic pain requiring opiods  - Diabetes  - Coronary Artery Disease  - Heart failure  - Heart attack  - Stroke  - DVT/VTE  - Cardiac arrhythmia  - Respiratory Failure/COPD  - Renal failure  - Anemia  - Advanced Liver disease     Brooke Duncan is an 78 y.o. female.    Chief Complaint: right shoulder pain  HPI: Pt is a 78 y.o. female complaining of right shoulder pain for multiple years. Pain had continually increased since the beginning. X-rays in the clinic show end-stage arthritic changes of the right shoulder. Pt has tried various conservative treatments which have failed to alleviate their symptoms, including injections and therapy. Various options are discussed with the patient. Risks, benefits and expectations were discussed with the patient. Patient understand the risks, benefits and expectations and wishes to proceed with surgery.   PCP:  Okey Carlin Redbird, MD  D/C Plans: Home  PMH: Past Medical History:  Diagnosis Date   Acquired solitary kidney    right side 2014   Arthritis    rt knee rt shoulder   Bronchitis 05/08/2023   Cancer (HCC)    renal cell carcinoma- left kidney removed   Complication of anesthesia    slow to wake, hard to take a deep breath with 1 surgery several yrs ago   Frequency of urination    Headache    Heart murmur    per told by pcp approx. 02/ 2022 heard a very faint murmur, told did need work-up done at this time   History of COVID-19 2021   all symptoms resolved   History of Hashimoto thyroiditis    s/p  total thyroidectomy 1995   History of hypercalcemia    s/p  parathryoidectomy 07/ 2021   History of renal cell carcinoma 2014   s/p  left nephrectomy, pt stated no other treatment   Hypertension    followed by pcp    Hypothyroidism, postsurgical 1995   followed by pcp in [place], moved from [place]  01/ 2021   OSA on CPAP    Thickened endometrium    UTI (urinary tract infection)    finished keflex  08-12-2022   Wears glasses     PSH: Past Surgical History:  Procedure Laterality Date   BREAST BIOPSY Left 08/24/2021   BREAST LUMPECTOMY WITH RADIOACTIVE SEED LOCALIZATION Left 07/14/2022   Procedure: LEFT BREAST LUMPECTOMY WITH RADIOACTIVE SEED LOCALIZATION;  Surgeon: Ebbie Cough, MD;  Location: [facility];  Service: General;  Laterality: Left;   CYSTOSCOPY  08/04/2022   in dr winter office   CYSTOSCOPY WITH BIOPSY N/A 09/02/2022   Procedure: CYSTOSCOPY WITH BLADDER BIOPSY;  Surgeon: Devere Lonni Righter, MD;  Location: [facility];  Service: Urology;  Laterality: N/A;  ONLY NEEDS 30 MIN   HYSTEROSCOPY WITH D & C N/A 04/08/2021   Procedure: DILATATION AND CURETTAGE /HYSTEROSCOPY, EXCISION OF VAGINAL LESION;  Surgeon: Darcel Pool, MD;  [facility];  Service: Gynecology;  Laterality: N/A;   LAPAROSCOPIC CHOLECYSTECTOMY  2005   NEPHRECTOMY Left 2014   PARATHYROIDECTOMY  07/ 2021  in [place]    REVERSE SHOULDER ARTHROPLASTY Left 06/16/2023   Procedure: REVERSE SHOULDER ARTHROPLASTY;  Surgeon: Kay Kemps, MD;  Location: [facility];  Service:  Orthopedics;  Laterality: Left;   TONSILLECTOMY  age 46   TOTAL HIP ARTHROPLASTY Bilateral left 2015;  right 2009   TOTAL KNEE ARTHROPLASTY Left 2014   TOTAL THYROIDECTOMY Bilateral 1995   TRIGGER FINGER RELEASE Right 01/24/2024   Procedure: RELEASE TRIGGER FINGER/A-1 PULLEY MIDDLE AND RING;  Surgeon: Romona Harari, MD;  Location: [facility];  Service: Orthopedics;  Laterality: Right;  local   WISDOM TOOTH EXTRACTION Bilateral     Social History:  reports that she quit smoking about 50 years ago. Her smoking use included cigarettes. She started smoking about 62 years ago. She has a  18 pack-year smoking history. She has never used smokeless tobacco. She reports current alcohol use. She reports that she does not use drugs. BMI: Estimated body mass index is 43.84 kg/m as calculated from the following:   Height as of 01/24/24: 5' 6 (1.676 m).   Weight as of 01/24/24: 123.2 kg.  Lab Results  Component Value Date   ALBUMIN 3.7 09/01/2021   Diabetes: Patient does not have a diagnosis of diabetes.     Smoking Status:      Allergies:  Allergies  Allergen Reactions   Wound Dressing Adhesive Rash    Adhesive tape causes rash, can use papee tape   Tape Rash and Other (See Comments)    Blistery rash DERMABOND    Medications: No current facility-administered medications for this encounter.   Current Outpatient Medications  Medication Sig Dispense Refill   acetaminophen  (TYLENOL ) 500 MG tablet Take 2 tablets (1,000 mg total) by mouth every 6 (six) hours as needed. 30 tablet 0   albuterol  (VENTOLIN  HFA) 108 (90 Base) MCG/ACT inhaler Inhale 2 puffs into the lungs every 4 (four) hours as needed for shortness of breath or wheezing.     Ascorbic Acid  (VITAMIN C  PO) Take 1 tablet by mouth daily.     Cholecalciferol  (VITAMIN D3 SUPER STRENGTH) 50 MCG (2000 UT) CAPS Take 2,000 Units by mouth daily.     Ciprofloxacin  (CIPRO  PO) Take by mouth.     clotrimazole  (LOTRIMIN ) 1 % cream Apply 1 application  topically 2 (two) times daily as needed (irritation).     CRANBERRY PO Take 2 capsules by mouth daily.     D-MANNOSE PO Take 4 capsules by mouth daily.     erythromycin  ophthalmic ointment Place 1 application  into both eyes daily as needed (blepharitis).     hydrocortisone  2.5 % cream Apply 1 Application topically 2 (two) times daily as needed (irritation).     letrozole  (FEMARA ) 2.5 MG tablet Take 1 tablet (2.5 mg total) by mouth daily. 90 tablet 3   levothyroxine  (SYNTHROID ) 150 MCG tablet Take 150 mcg by mouth daily before breakfast.     losartan  (COZAAR ) 50 MG tablet Take  50 mg by mouth daily.     methocarbamol  (ROBAXIN ) 500 MG tablet Take 1 tablet (500 mg total) by mouth every 8 (eight) hours as needed for muscle spasms. 40 tablet 1   NON FORMULARY Pt uses a cpap nightly     oxybutynin  (DITROPAN ) 5 MG tablet Take 1 tablet (5 mg total) by mouth every 8 (eight) hours as needed for bladder spasms. 30 tablet 1   Probiotic Product (PROBIOTIC DAILY PO) Take 1 capsule by mouth daily.     simvastatin  (ZOCOR ) 10 MG tablet Take 10 mg by mouth at bedtime.     tamsulosin  (FLOMAX ) 0.4 MG CAPS capsule Take 1 capsule (0.4 mg total) by mouth daily. (  Patient taking differently: Take 0.4 mg by mouth daily after breakfast.) 30 capsule    vitamin B-12 (CYANOCOBALAMIN ) 1000 MCG tablet Take 1,000 mcg by mouth daily.      No results found for this or any previous visit (from the past 48 hours). No results found.  ROS: Pain with rom of the right upper extremity  Physical Exam: Alert and oriented 78 y.o. female in no acute distress Cranial nerves 2-12 intact Cervical spine: full rom with no tenderness, nv intact distally Chest: active breath sounds bilaterally, no wheeze rhonchi or rales Heart: regular rate and rhythm, no murmur Abd: non tender non distended with active bowel sounds Hip is stable with rom  Right shoulder painful and weak rom Nv intact distally No rashes or edema distally  Assessment/Plan Assessment: right shoulder cuff arthropathy  Plan:  Patient will undergo a right reverse total shoulder by Dr. Kay at [facility] Risks benefits and expectations were discussed with the patient. Patient understand risks, benefits and expectations and wishes to proceed. Preoperative templating of the joint replacement has been completed, documented, and submitted to the Operating Room personnel in order to optimize intra-operative equipment management.   Arvella Fireman PA-C, MPAS [facility] is now [employer] [address], [address],  [place], [place] [address] Phone: [phone] [email or web address] Facebook  [employer]

## 2024-06-24 ENCOUNTER — Encounter (HOSPITAL_COMMUNITY)
Admission: RE | Admit: 2024-06-24 | Discharge: 2024-06-24 | Disposition: A | Discharge status: Home / Self Care | Source: Ambulatory Visit | Attending: Anesthesiology | Admitting: Anesthesiology

## 2024-06-24 DIAGNOSIS — I10 Essential (primary) hypertension: Secondary | ICD-10-CM

## 2024-06-24 DIAGNOSIS — Z01818 Encounter for other preprocedural examination: Secondary | ICD-10-CM

## 2024-06-26 NOTE — Progress Notes (Signed)
 Anesthesia Review:  PCP: Dale gull  Cardiologist :  PPM/ ICD: Device Orders: Rep Notified:  Chest x-ray : EKG : 01/19/24  Echo : 03/30/23  Stress test: Cardiac Cath :   Activity level:  Sleep Study/ CPAP : Fasting Blood Sugar :      / Checks Blood Sugar -- times a day:    Blood Thinner/ Instructions /Last Dose: ASA / Instructions/ Last Dose :

## 2024-06-27 NOTE — Patient Instructions (Signed)
 SURGICAL WAITING ROOM VISITATION  Patients having surgery or a procedure may have no more than 2 support people in the waiting area - these visitors may rotate.    Children under the age of 27 must have an adult with them who is not the patient.  Visitors with respiratory illnesses are discouraged from visiting and should remain at home.  If the patient needs to stay at the hospital during part of their recovery, the visitor guidelines for inpatient rooms apply. Pre-op nurse will coordinate an appropriate time for 1 support person to accompany patient in pre-op.  This support person may not rotate.    Please refer to the [facility] website for the visitor guidelines for Inpatients (after your surgery is over and you are in a regular room).       Your procedure is scheduled on:  07/05/2024    Report to [facility] Main Entrance    Report to admitting at  1100 AM   Call this number if you have problems the morning of surgery [phone]   Do not eat food :After Midnight.   After Midnight you may have the following liquids until _ 1030_____ AM DAY OF SURGERY  Water  Non-Citrus Juices (without pulp, NO RED-Apple, White grape, White cranberry) Black Coffee (NO MILK/CREAM OR CREAMERS, sugar ok)  Clear Tea (NO MILK/CREAM OR CREAMERS, sugar ok) regular and decaf                             Plain Jell-O (NO RED)                                           Fruit ices (not with fruit pulp, NO RED)                                     Popsicles (NO RED)                                                               Sports drinks like Gatorade (NO RED)                     The day of surgery:  Drink ONE (1) Pre-Surgery Clear Ensure or G2 at 1030 AM the morning of surgery. Drink in one sitting. Do not sip.  This drink was given to you during your hospital  pre-op appointment visit. Nothing else to drink after completing the  Pre-Surgery Clear Ensure or G2.          If you have  questions, please contact your surgeon's office.       Oral Hygiene is also important to reduce your risk of infection.                                    Remember - BRUSH YOUR TEETH THE MORNING OF SURGERY WITH YOUR REGULAR TOOTHPASTE  DENTURES WILL BE REMOVED PRIOR TO SURGERY PLEASE DO NOT APPLY Poly grip OR  ADHESIVES!!!   Do NOT smoke after Midnight   Stop all vitamins and herbal supplements 7 days before surgery.   Take these medicines the morning of surgery with A SIP OF WATER :  inhalers as usual and bring, synthroid ,   DO NOT TAKE ANY ORAL DIABETIC MEDICATIONS DAY OF YOUR SURGERY  Bring CPAP mask and tubing day of surgery.                              You may not have any metal on your body including hair pins, jewelry, and body piercing             Do not wear make-up, lotions, powders, perfumes/cologne, or deodorant  Do not wear nail polish including gel and S&S, artificial/acrylic nails, or any other type of covering on natural nails including finger and toenails. If you have artificial nails, gel coating, etc. that needs to be removed by a nail salon please have this removed prior to surgery or surgery may need to be canceled/ delayed if the surgeon/ anesthesia feels like they are unable to be safely monitored.   Do not shave  48 hours prior to surgery.               Men may shave face and neck.   Do not bring valuables to the hospital. [facility] IS NOT             RESPONSIBLE   FOR VALUABLES.   Contacts, glasses, dentures or bridgework may not be worn into surgery.   Bring small overnight bag day of surgery.   DO NOT BRING YOUR HOME MEDICATIONS TO THE HOSPITAL. PHARMACY WILL DISPENSE MEDICATIONS LISTED ON YOUR MEDICATION LIST TO YOU DURING YOUR ADMISSION IN THE HOSPITAL!    Patients discharged on the day of surgery will not be allowed to drive home.  Someone NEEDS to stay with you for the first 24 hours after anesthesia.   Special Instructions: Bring a copy of  your healthcare power of attorney and living will documents the day of surgery if you haven't scanned them before.              Please read over the following fact sheets you were given: IF YOU HAVE QUESTIONS ABOUT YOUR PRE-OP INSTRUCTIONS PLEASE CALL [phone].   If you received a COVID test during your pre-op visit  it is requested that you wear a mask when out in public, stay away from anyone that may not be feeling well and notify your surgeon if you develop symptoms. If you test positive for Covid or have been in contact with anyone that has tested positive in the last 10 days please notify you surgeon.      Pre-operative 5 CHG Bath Instructions   You can play a key role in reducing the risk of infection after surgery. Your skin needs to be as free of germs as possible. You can reduce the number of germs on your skin by washing with CHG (chlorhexidine  gluconate) soap before surgery. CHG is an antiseptic soap that kills germs and continues to kill germs even after washing.   DO NOT use if you have an allergy to chlorhexidine /CHG or antibacterial soaps. If your skin becomes reddened or irritated, stop using the CHG and notify one of our RNs at [phone].   Please shower with the CHG soap starting 4 days before surgery using the following schedule:     Please  keep in mind the following:  DO NOT shave, including legs and underarms, starting the day of your first shower.   You may shave your face at any point before/day of surgery.  Place clean sheets on your bed the day you start using CHG soap. Use a clean washcloth (not used since being washed) for each shower. DO NOT sleep with pets once you start using the CHG.   CHG Shower Instructions:  If you choose to wash your hair and private area, wash first with your normal shampoo/soap.  After you use shampoo/soap, rinse your hair and body thoroughly to remove shampoo/soap residue.  Turn the water  OFF and apply about 3 tablespoons (45 ml)  of CHG soap to a CLEAN washcloth.  Apply CHG soap ONLY FROM YOUR NECK DOWN TO YOUR TOES (washing for 3-5 minutes)  DO NOT use CHG soap on face, private areas, open wounds, or sores.  Pay special attention to the area where your surgery is being performed.  If you are having back surgery, having someone wash your back for you may be helpful. Wait 2 minutes after CHG soap is applied, then you may rinse off the CHG soap.  Pat dry with a clean towel  Put on clean clothes/pajamas   If you choose to wear lotion, please use ONLY the CHG-compatible lotions on the back of this paper.     Additional instructions for the day of surgery: DO NOT APPLY any lotions, deodorants, cologne, or perfumes.   Put on clean/comfortable clothes.  Brush your teeth.  Ask your nurse before applying any prescription medications to the skin.      CHG Compatible Lotions   Aveeno Moisturizing lotion  Cetaphil Moisturizing Cream  Cetaphil Moisturizing Lotion  Clairol Herbal Essence Moisturizing Lotion, Dry Skin  Clairol Herbal Essence Moisturizing Lotion, Extra Dry Skin  Clairol Herbal Essence Moisturizing Lotion, Normal Skin  Curel Age Defying Therapeutic Moisturizing Lotion with Alpha Hydroxy  Curel Extreme Care Body Lotion  Curel Soothing Hands Moisturizing Hand Lotion  Curel Therapeutic Moisturizing Cream, Fragrance-Free  Curel Therapeutic Moisturizing Lotion, Fragrance-Free  Curel Therapeutic Moisturizing Lotion, Original Formula  Eucerin Daily Replenishing Lotion  Eucerin Dry Skin Therapy Plus Alpha Hydroxy Crme  Eucerin Dry Skin Therapy Plus Alpha Hydroxy Lotion  Eucerin Original Crme  Eucerin Original Lotion  Eucerin Plus Crme Eucerin Plus Lotion  Eucerin TriLipid Replenishing Lotion  Keri Anti-Bacterial Hand Lotion  Keri Deep Conditioning Original Lotion Dry Skin Formula Softly Scented  Keri Deep Conditioning Original Lotion, Fragrance Free Sensitive Skin Formula  Keri Lotion Fast Absorbing  Fragrance Free Sensitive Skin Formula  Keri Lotion Fast Absorbing Softly Scented Dry Skin Formula  Keri Original Lotion  Keri Skin Renewal Lotion Keri Silky Smooth Lotion  Keri Silky Smooth Sensitive Skin Lotion  Nivea Body Creamy Conditioning Oil  Nivea Body Extra Enriched Lotion  Nivea Body Original Lotion  Nivea Body Sheer Moisturizing Lotion Nivea Crme  Nivea Skin Firming Lotion  NutraDerm 30 Skin Lotion  NutraDerm Skin Lotion  NutraDerm Therapeutic Skin Cream  NutraDerm Therapeutic Skin Lotion  ProShield Protective Hand Cream  Provon moisturizing lotion   [facility]- Preparing for Total Shoulder Arthroplasty    Before surgery, you can play an important role. Because skin is not sterile, your skin needs to be as free of germs as possible. You can reduce the number of germs on your skin by using the following products. Benzoyl Peroxide Gel Reduces the number of germs present on the skin Applied twice a  day to shoulder area starting two days before surgery    ==================================================================  Please follow these instructions carefully:  BENZOYL PEROXIDE 5% GEL  Please do not use if you have an allergy to benzoyl peroxide.   If your skin becomes reddened/irritated stop using the benzoyl peroxide.  Starting two days before surgery, apply as follows: Apply benzoyl peroxide in the morning and at night. Apply after taking a shower. If you are not taking a shower clean entire shoulder front, back, and side along with the armpit with a clean wet washcloth.  Place a quarter-sized dollop on your shoulder and rub in thoroughly, making sure to cover the front, back, and side of your shoulder, along with the armpit.   2 days before ____ AM   ____ PM              1 day before ____ AM   ____ PM                         Do this twice a day for two days.  (Last application is the night before surgery, AFTER using the CHG soap as described below).  Do NOT  apply benzoyl peroxide gel on the day of surgery.

## 2024-06-28 ENCOUNTER — Encounter (HOSPITAL_COMMUNITY)
Admission: RE | Admit: 2024-06-28 | Discharge: 2024-06-28 | Disposition: A | Discharge status: Home / Self Care | Source: Ambulatory Visit | Attending: Orthopedic Surgery | Admitting: Orthopedic Surgery

## 2024-06-28 ENCOUNTER — Other Ambulatory Visit: Payer: Self-pay

## 2024-06-28 ENCOUNTER — Encounter (HOSPITAL_COMMUNITY): Payer: Self-pay

## 2024-06-28 DIAGNOSIS — G4733 Obstructive sleep apnea (adult) (pediatric): Secondary | ICD-10-CM | POA: Diagnosis not present

## 2024-06-28 DIAGNOSIS — Z01812 Encounter for preprocedural laboratory examination: Secondary | ICD-10-CM | POA: Insufficient documentation

## 2024-06-28 DIAGNOSIS — R339 Retention of urine, unspecified: Secondary | ICD-10-CM | POA: Insufficient documentation

## 2024-06-28 DIAGNOSIS — E890 Postprocedural hypothyroidism: Secondary | ICD-10-CM | POA: Insufficient documentation

## 2024-06-28 DIAGNOSIS — Z87891 Personal history of nicotine dependence: Secondary | ICD-10-CM | POA: Insufficient documentation

## 2024-06-28 DIAGNOSIS — M19011 Primary osteoarthritis, right shoulder: Secondary | ICD-10-CM | POA: Diagnosis not present

## 2024-06-28 DIAGNOSIS — K219 Gastro-esophageal reflux disease without esophagitis: Secondary | ICD-10-CM | POA: Diagnosis not present

## 2024-06-28 DIAGNOSIS — I10 Essential (primary) hypertension: Secondary | ICD-10-CM | POA: Insufficient documentation

## 2024-06-28 DIAGNOSIS — E669 Obesity, unspecified: Secondary | ICD-10-CM | POA: Diagnosis not present

## 2024-06-28 DIAGNOSIS — Z6841 Body Mass Index (BMI) 40.0 and over, adult: Secondary | ICD-10-CM | POA: Diagnosis not present

## 2024-06-28 DIAGNOSIS — Z01818 Encounter for other preprocedural examination: Secondary | ICD-10-CM

## 2024-06-28 HISTORY — DX: Presence of other specified devices: Z97.8

## 2024-06-28 HISTORY — DX: Personal history of urinary calculi: Z87.442

## 2024-06-28 LAB — CBC
HCT: 47.5 % — ABNORMAL HIGH (ref 36.0–46.0)
Hemoglobin: 14.4 g/dL (ref 12.0–15.0)
MCH: 26.9 pg (ref 26.0–34.0)
MCHC: 30.3 g/dL (ref 30.0–36.0)
MCV: 88.8 fL (ref 80.0–100.0)
Platelets: 197 K/uL (ref 150–400)
RBC: 5.35 MIL/uL — ABNORMAL HIGH (ref 3.87–5.11)
RDW: 16.1 % — ABNORMAL HIGH (ref 11.5–15.5)
WBC: 9 K/uL (ref 4.0–10.5)
nRBC: 0 % (ref 0.0–0.2)

## 2024-06-28 LAB — BASIC METABOLIC PANEL WITH GFR
Anion gap: 8 (ref 5–15)
BUN: 19 mg/dL (ref 8–23)
CO2: 26 mmol/L (ref 22–32)
Calcium: 9.6 mg/dL (ref 8.9–10.3)
Chloride: 105 mmol/L (ref 98–111)
Creatinine, Ser: 0.68 mg/dL (ref 0.44–1.00)
GFR, Estimated: >60 mL/min (ref >60)
Glucose, Bld: 102 mg/dL — ABNORMAL HIGH (ref 70–99)
Potassium: 4.3 mmol/L (ref 3.5–5.1)
Sodium: 139 mmol/L (ref 135–145)

## 2024-06-28 LAB — SURGICAL PCR SCREEN
MRSA, PCR: NEGATIVE
Staphylococcus aureus: NEGATIVE

## 2024-06-28 NOTE — Telephone Encounter (Signed)
 Incoming call from Darice with [facility], verified patient name and and DOB. She is asking for verbal orders to see patient. I reviewed directive in last office note and her her know patient is activity as tolerated. She voiced understanding and I did give her verbal approval to see patient as well. No further needs at this time.

## 2024-07-02 ENCOUNTER — Encounter (HOSPITAL_COMMUNITY): Payer: Self-pay

## 2024-07-02 NOTE — Progress Notes (Signed)
 Case: [id number] Date/Time: 07/05/24 1315   Procedure: ARTHROPLASTY, SHOULDER, TOTAL, REVERSE (Right: Shoulder)   Anesthesia type: Choice   Pre-op diagnosis: Right shoulder rotator cuff arthropathy   Location: [facility] 06 / [facility]   Surgeons: Kay Kemps, MD       DISCUSSION: Brooke Duncan is a 78 yo female with PMH of former smoking, HTN, palpitations, OSA (uses CPAP), GERD, s/p thyroidectomy with post op hypothyroidism, cecal mass s/p right colectomy (2023), RCC s/p left nephrectomy (2014), chronic urinary retention with indwelling foley, arthritis, obesity (BMI 40).  Complication from anesthesia includes prolonged emergence.  Patient had a fall on 5/20. Presented to [facility] ED and diagnosed with L distal radius fx. Had ORIF on 5/22. No complications noted. Discharged to home. Seen on 7/7 by Ortho and released from any restrictions. Ambulates with walker.  Patient follows with [facility]iology at [facility] for palpitations and HTN. Seen on 03/21/24. Stable at that visit. Updated echo was requested by patient and was normal. Cardiac clearance signed patient is low risk (scanned in media on 04/02/24)  Seen by PCP on 03/21/24 for annual visit. Stable at that visit. Cleared by PCP (scanned in media on 04/02/24)  Has foley catheter. - Changed out by Urology.  Will be changed on 07/04/24 per pt. Foley catheter has been in place since 01/2024 due to bladder not emptying.     VS: BP (!) 145/73   Pulse 80   Temp 37.1 C (Oral)   Resp 16   Ht 5' 6 (1.676 m)   Wt 114.3 kg   SpO2 95%   BMI 40.67 kg/m   PROVIDERS: Okey Carlin Redbird, MD Cardiologist : Vinie Roulette    LABS: Labs reviewed: Acceptable for surgery. (all labs ordered are listed, but only abnormal results are displayed)  Labs Reviewed  BASIC METABOLIC PANEL WITH GFR - Abnormal; Notable for the following components:      Result Value   Glucose, Bld 102 (*)    All other components within normal limits  CBC - Abnormal; Notable for  the following components:   RBC 5.35 (*)    HCT 47.5 (*)    RDW 16.1 (*)    All other components within normal limits  SURGICAL PCR SCREEN     IMAGES:   EKG 01/19/24:  Sinus rhythm with Premature atrial complexes, rate 72 Low voltage QRS Septal infarct , age undetermined  CV:  Echo 04/01/2024 ([facility]):  Impression Left Ventricle: Systolic function is normal. EF: 55-60%.   Left Ventricle: Wall thickness is normal.   Left Ventricle: Left ventricle size is normal.   Left Ventricle: No regional wall motion abnormalities noted.   Left Ventricle: Doppler parameters indicate normal diastolic function.   Mitral Valve: There is mild posterior annular calcification.   Aorta: The aortic root is normal in size. The ascending aorta is mildly dilated 4.1 cm  Past Medical History:  Diagnosis Date   Acquired solitary kidney    right side 2014   Arthritis    rt knee rt shoulder   Bronchitis 05/08/2023   Cancer (HCC)    renal cell carcinoma- left kidney removed and left breast cancer   Complication of anesthesia    slow to wake, hard to take a deep breath with 1 surgery several yrs ago   Foley catheter in place    Frequency of urination    Heart murmur    per told by pcp approx. 02/ 2022 heard a very faint murmur, told did need work-up  done at this time   History of COVID-19 2021   all symptoms resolved   History of Hashimoto thyroiditis    s/p  total thyroidectomy 1995   History of hypercalcemia    s/p  parathryoidectomy 07/ 2021   History of kidney stones    History of renal cell carcinoma 2014   s/p  left nephrectomy, pt stated no other treatment   Hypertension    followed by pcp   Hypothyroidism, postsurgical 1995   followed by pcp in [place], moved from [place]  01/ 2021   OSA on CPAP    Thickened endometrium    UTI (urinary tract infection)    finished keflex  08-12-2022   Wears glasses     Past Surgical History:  Procedure Laterality Date   BREAST BIOPSY Left  08/24/2021   BREAST LUMPECTOMY WITH RADIOACTIVE SEED LOCALIZATION Left 07/14/2022   Procedure: LEFT BREAST LUMPECTOMY WITH RADIOACTIVE SEED LOCALIZATION;  Surgeon: Ebbie Cough, MD;  Location: [facility];  Service: General;  Laterality: Left;   COLON SURGERY     10/2022 at [facility]   CYSTOSCOPY  08/04/2022   in dr winter office   CYSTOSCOPY WITH BIOPSY N/A 09/02/2022   Procedure: CYSTOSCOPY WITH BLADDER BIOPSY;  Surgeon: Devere Lonni Righter, MD;  Location: [facility];  Service: Urology;  Laterality: N/A;  ONLY NEEDS 30 MIN   HYSTEROSCOPY WITH D & C N/A 04/08/2021   Procedure: DILATATION AND CURETTAGE /HYSTEROSCOPY, EXCISION OF VAGINAL LESION;  Surgeon: Darcel Pool, MD;  Location: [facility];  Service: Gynecology;  Laterality: N/A;   LAPAROSCOPIC CHOLECYSTECTOMY  2005   NEPHRECTOMY Left 2014   PARATHYROIDECTOMY  07/ 2021  in [place]    REVERSE SHOULDER ARTHROPLASTY Left 06/16/2023   Procedure: REVERSE SHOULDER ARTHROPLASTY;  Surgeon: Kay Kemps, MD;  Location: [facility];  Service: Orthopedics;  Laterality: Left;   TONSILLECTOMY  age 27   TOTAL HIP ARTHROPLASTY Bilateral left 2015;  right 2009   TOTAL KNEE ARTHROPLASTY Left 2014   TOTAL THYROIDECTOMY Bilateral 1995   TRIGGER FINGER RELEASE Right 01/24/2024   Procedure: RELEASE TRIGGER FINGER/A-1 PULLEY MIDDLE AND RING;  Surgeon: Romona Harari, MD;  Location: [facility];  Service: Orthopedics;  Laterality: Right;  local   WISDOM TOOTH EXTRACTION Bilateral     MEDICATIONS:  acetaminophen  (TYLENOL ) 500 MG tablet   albuterol  (VENTOLIN  HFA) 108 (90 Base) MCG/ACT inhaler   Ascorbic Acid  (VITAMIN C  PO)   Cholecalciferol  (VITAMIN D3 SUPER STRENGTH) 50 MCG (2000 UT) CAPS   clotrimazole  (LOTRIMIN ) 1 % cream   CRANBERRY PO   D-MANNOSE PO   erythromycin  ophthalmic ointment   hydrocortisone  2.5 % cream   letrozole  (FEMARA ) 2.5 MG tablet   levothyroxine   (SYNTHROID ) 150 MCG tablet   losartan  (COZAAR ) 100 MG tablet   methocarbamol  (ROBAXIN ) 500 MG tablet   NON FORMULARY   oxybutynin  (DITROPAN ) 5 MG tablet   Probiotic Product (PROBIOTIC DAILY PO)   simvastatin  (ZOCOR ) 10 MG tablet   tamsulosin  (FLOMAX ) 0.4 MG CAPS capsule   vitamin B-12 (CYANOCOBALAMIN ) 1000 MCG tablet   No current facility-administered medications for this encounter.   Burnard CHRISTELLA Odis DEVONNA [facility] Surgical Short Stay/Anesthesiology [facility] Phone [phone] 07/02/2024 9:21 AM

## 2024-07-02 NOTE — Anesthesia Preprocedure Evaluation (Signed)
 Anesthesia Evaluation  Patient identified by MRN, date of birth, ID band Patient awake    Reviewed: Allergy & Precautions, NPO status , Patient's Chart, lab work & pertinent test results  Airway Mallampati: II  TM Distance: >3 FB Neck ROM: Full    Dental  (+) Teeth Intact, Dental Advisory Given   Pulmonary sleep apnea and Continuous Positive Airway Pressure Ventilation , former smoker   Pulmonary exam normal breath sounds clear to auscultation       Cardiovascular hypertension, Pt. on medications Normal cardiovascular exam Rhythm:Regular Rate:Normal     Neuro/Psych negative neurological ROS  negative psych ROS   GI/Hepatic negative GI ROS, Neg liver ROS,,,  Endo/Other  Hypothyroidism  Class 3 obesity  Renal/GU Renal disease (RCC s/p left nephrectomy)     Musculoskeletal  (+) Arthritis ,  Right shoulder rotator cuff arthropathy   Abdominal   Peds  Hematology negative hematology ROS (+)   Anesthesia Other Findings Day of surgery medications reviewed with the patient.  Reproductive/Obstetrics                              Anesthesia Physical Anesthesia Plan  ASA: 3  Anesthesia Plan: General   Post-op Pain Management: Regional block* and Tylenol  PO (pre-op)*   Induction: Intravenous  PONV Risk Score and Plan: 3 and Dexamethasone  and Ondansetron   Airway Management Planned: Oral ETT  Additional Equipment:   Intra-op Plan:   Post-operative Plan: Extubation in OR  Informed Consent: I have reviewed the patients History and Physical, chart, labs and discussed the procedure including the risks, benefits and alternatives for the proposed anesthesia with the patient or authorized representative who has indicated his/her understanding and acceptance.     Dental advisory given  Plan Discussed with: CRNA  Anesthesia Plan Comments: (See PAT note from 7/25)         Anesthesia  Quick Evaluation

## 2024-07-05 ENCOUNTER — Ambulatory Visit (HOSPITAL_BASED_OUTPATIENT_CLINIC_OR_DEPARTMENT_OTHER): Payer: Self-pay | Admitting: Anesthesiology

## 2024-07-05 ENCOUNTER — Ambulatory Visit (HOSPITAL_COMMUNITY): Payer: Self-pay | Admitting: Physician Assistant

## 2024-07-05 ENCOUNTER — Observation Stay (HOSPITAL_COMMUNITY)
Admission: RE | Admit: 2024-07-05 | Discharge: 2024-07-06 | Disposition: A | Discharge status: Home / Self Care | Source: Ambulatory Visit | Attending: Orthopedic Surgery | Admitting: Orthopedic Surgery

## 2024-07-05 ENCOUNTER — Encounter (HOSPITAL_COMMUNITY): Payer: Self-pay | Admitting: Orthopedic Surgery

## 2024-07-05 ENCOUNTER — Observation Stay (HOSPITAL_COMMUNITY)

## 2024-07-05 ENCOUNTER — Encounter (HOSPITAL_COMMUNITY)
Admission: RE | Disposition: A | Discharge status: Home / Self Care | Payer: Self-pay | Source: Ambulatory Visit | Attending: Orthopedic Surgery

## 2024-07-05 ENCOUNTER — Other Ambulatory Visit: Payer: Self-pay

## 2024-07-05 DIAGNOSIS — F1090 Alcohol use, unspecified, uncomplicated: Secondary | ICD-10-CM | POA: Insufficient documentation

## 2024-07-05 DIAGNOSIS — I10 Essential (primary) hypertension: Secondary | ICD-10-CM | POA: Insufficient documentation

## 2024-07-05 DIAGNOSIS — M12811 Other specific arthropathies, not elsewhere classified, right shoulder: Secondary | ICD-10-CM | POA: Insufficient documentation

## 2024-07-05 DIAGNOSIS — Z96611 Presence of right artificial shoulder joint: Principal | ICD-10-CM

## 2024-07-05 DIAGNOSIS — E039 Hypothyroidism, unspecified: Secondary | ICD-10-CM

## 2024-07-05 DIAGNOSIS — Z87891 Personal history of nicotine dependence: Secondary | ICD-10-CM | POA: Insufficient documentation

## 2024-07-05 DIAGNOSIS — G4733 Obstructive sleep apnea (adult) (pediatric): Secondary | ICD-10-CM | POA: Diagnosis not present

## 2024-07-05 DIAGNOSIS — I214 Non-ST elevation (NSTEMI) myocardial infarction: Secondary | ICD-10-CM | POA: Diagnosis not present

## 2024-07-05 HISTORY — PX: REVERSE SHOULDER ARTHROPLASTY: SHX5054

## 2024-07-05 SURGERY — ARTHROPLASTY, SHOULDER, TOTAL, REVERSE
Anesthesia: General | Site: Shoulder | Laterality: Right

## 2024-07-05 MED ORDER — SUGAMMADEX SODIUM 200 MG/2ML IV SOLN
INTRAVENOUS | Status: DC | PRN
  Administered 2024-07-05: 200 mg via INTRAVENOUS

## 2024-07-05 MED ORDER — ROCURONIUM BROMIDE 100 MG/10ML IV SOLN
INTRAVENOUS | Status: DC | PRN
  Administered 2024-07-05: 50 mg via INTRAVENOUS

## 2024-07-05 MED ORDER — METHOCARBAMOL 500 MG PO TABS: 500.0000 mg | ORAL_TABLET | Freq: Three times a day (TID) | ORAL | Status: DC | PRN

## 2024-07-05 MED ORDER — ONDANSETRON HCL 4 MG PO TABS: 4.0000 mg | ORAL_TABLET | Freq: Three times a day (TID) | ORAL | 1 refills | Status: DC | PRN

## 2024-07-05 MED ORDER — ONDANSETRON HCL 4 MG/2ML IJ SOLN
INTRAMUSCULAR | Status: AC
  Filled 2024-07-05: qty 2

## 2024-07-05 MED ORDER — DOCUSATE SODIUM 100 MG PO CAPS
100.0000 mg | ORAL_CAPSULE | Freq: Two times a day (BID) | ORAL | Status: DC
  Administered 2024-07-05 – 2024-07-06 (×2): 100 mg via ORAL
  Filled 2024-07-05 (×2): qty 1

## 2024-07-05 MED ORDER — ACETAMINOPHEN 500 MG PO TABS
1000.0000 mg | ORAL_TABLET | Freq: Once | ORAL | Status: AC
  Administered 2024-07-05: 1000 mg via ORAL
  Filled 2024-07-05: qty 2

## 2024-07-05 MED ORDER — VANCOMYCIN HCL 1000 MG IV SOLR
INTRAVENOUS | Status: AC
  Filled 2024-07-05: qty 20

## 2024-07-05 MED ORDER — CLOTRIMAZOLE 1 % EX CREA: 1.0000 | TOPICAL_CREAM | Freq: Two times a day (BID) | CUTANEOUS | Status: DC | PRN

## 2024-07-05 MED ORDER — TRAMADOL HCL 50 MG PO TABS
50.0000 mg | ORAL_TABLET | Freq: Four times a day (QID) | ORAL | Status: DC | PRN
  Administered 2024-07-05 – 2024-07-06 (×3): 50 mg via ORAL
  Filled 2024-07-05 (×3): qty 1

## 2024-07-05 MED ORDER — PHENOL 1.4 % MT LIQD: 1.0000 | OROMUCOSAL | Status: DC | PRN

## 2024-07-05 MED ORDER — D-MANNOSE 500 MG PO CAPS: ORAL_CAPSULE | Freq: Every day | ORAL | Status: DC

## 2024-07-05 MED ORDER — FENTANYL CITRATE (PF) 100 MCG/2ML IJ SOLN
INTRAMUSCULAR | Status: DC | PRN
  Administered 2024-07-05: 50 ug via INTRAVENOUS

## 2024-07-05 MED ORDER — CEFAZOLIN SODIUM-DEXTROSE 2-4 GM/100ML-% IV SOLN
2.0000 g | INTRAVENOUS | Status: AC
  Administered 2024-07-05: 2 g via INTRAVENOUS
  Filled 2024-07-05: qty 100

## 2024-07-05 MED ORDER — ERYTHROMYCIN 5 MG/GM OP OINT: 1.0000 | TOPICAL_OINTMENT | Freq: Every day | OPHTHALMIC | Status: DC | PRN

## 2024-07-05 MED ORDER — DEXAMETHASONE SODIUM PHOSPHATE 10 MG/ML IJ SOLN
INTRAMUSCULAR | Status: DC | PRN
  Administered 2024-07-05: 5 mg via INTRAVENOUS

## 2024-07-05 MED ORDER — CEFAZOLIN SODIUM-DEXTROSE 2-4 GM/100ML-% IV SOLN
2.0000 g | Freq: Four times a day (QID) | INTRAVENOUS | Status: AC
  Administered 2024-07-05 – 2024-07-06 (×2): 2 g via INTRAVENOUS
  Filled 2024-07-05 (×2): qty 100

## 2024-07-05 MED ORDER — TRANEXAMIC ACID-NACL 1000-0.7 MG/100ML-% IV SOLN
1000.0000 mg | INTRAVENOUS | Status: AC
  Administered 2024-07-05: 1000 mg via INTRAVENOUS
  Filled 2024-07-05: qty 100

## 2024-07-05 MED ORDER — MENTHOL 3 MG MT LOZG: 1.0000 | LOZENGE | OROMUCOSAL | Status: DC | PRN

## 2024-07-05 MED ORDER — METOCLOPRAMIDE HCL 5 MG PO TABS: 5.0000 mg | ORAL_TABLET | Freq: Three times a day (TID) | ORAL | Status: DC | PRN

## 2024-07-05 MED ORDER — METOCLOPRAMIDE HCL 5 MG/ML IJ SOLN: 5.0000 mg | Freq: Three times a day (TID) | INTRAMUSCULAR | Status: DC | PRN

## 2024-07-05 MED ORDER — FENTANYL CITRATE PF 50 MCG/ML IJ SOSY
50.0000 ug | PREFILLED_SYRINGE | INTRAMUSCULAR | Status: DC
  Administered 2024-07-05: 50 ug via INTRAVENOUS
  Filled 2024-07-05: qty 1

## 2024-07-05 MED ORDER — ORAL CARE MOUTH RINSE: 15.0000 mL | Freq: Once | OROMUCOSAL | Status: AC

## 2024-07-05 MED ORDER — LEVOTHYROXINE SODIUM 75 MCG PO TABS
150.0000 ug | ORAL_TABLET | Freq: Every day | ORAL | Status: DC
  Administered 2024-07-06: 150 ug via ORAL
  Filled 2024-07-05: qty 2

## 2024-07-05 MED ORDER — METHOCARBAMOL 1000 MG/10ML IJ SOLN: 500.0000 mg | Freq: Four times a day (QID) | INTRAMUSCULAR | Status: DC | PRN

## 2024-07-05 MED ORDER — OXYBUTYNIN CHLORIDE 5 MG PO TABS: 5.0000 mg | ORAL_TABLET | Freq: Three times a day (TID) | ORAL | Status: DC | PRN

## 2024-07-05 MED ORDER — POLYETHYLENE GLYCOL 3350 17 G PO PACK: 17.0000 g | PACK | Freq: Every day | ORAL | Status: DC | PRN

## 2024-07-05 MED ORDER — VITAMIN B-12 1000 MCG PO TABS
1000.0000 ug | ORAL_TABLET | Freq: Every day | ORAL | Status: DC
  Administered 2024-07-06: 1000 ug via ORAL
  Filled 2024-07-05: qty 1

## 2024-07-05 MED ORDER — ONDANSETRON HCL 4 MG/2ML IJ SOLN
INTRAMUSCULAR | Status: DC | PRN
  Administered 2024-07-05: 4 mg via INTRAVENOUS

## 2024-07-05 MED ORDER — TAMSULOSIN HCL 0.4 MG PO CAPS
0.4000 mg | ORAL_CAPSULE | Freq: Every day | ORAL | Status: DC
  Filled 2024-07-05: qty 1

## 2024-07-05 MED ORDER — ONDANSETRON HCL 4 MG/2ML IJ SOLN: 4.0000 mg | Freq: Once | INTRAMUSCULAR | Status: DC | PRN

## 2024-07-05 MED ORDER — BUPIVACAINE-EPINEPHRINE (PF) 0.25% -1:200000 IJ SOLN
INTRAMUSCULAR | Status: AC
  Filled 2024-07-05: qty 30

## 2024-07-05 MED ORDER — CHLORHEXIDINE GLUCONATE 0.12 % MT SOLN
15.0000 mL | Freq: Once | OROMUCOSAL | Status: AC
  Administered 2024-07-05: 15 mL via OROMUCOSAL

## 2024-07-05 MED ORDER — CRANBERRY 250 MG PO TABS: ORAL_TABLET | Freq: Every day | ORAL | Status: DC

## 2024-07-05 MED ORDER — BUPIVACAINE LIPOSOME 1.3 % IJ SUSP
INTRAMUSCULAR | Status: DC | PRN
Start: 2024-07-05 — End: 2024-07-05
  Administered 2024-07-05: 10 mL via PERINEURAL

## 2024-07-05 MED ORDER — ACETAMINOPHEN 500 MG PO TABS: 1000.0000 mg | ORAL_TABLET | Freq: Four times a day (QID) | ORAL | Status: DC | PRN

## 2024-07-05 MED ORDER — FENTANYL CITRATE PF 50 MCG/ML IJ SOSY
PREFILLED_SYRINGE | INTRAMUSCULAR | Status: AC
  Filled 2024-07-05: qty 1

## 2024-07-05 MED ORDER — RISAQUAD PO CAPS
1.0000 | ORAL_CAPSULE | Freq: Every day | ORAL | Status: DC
  Administered 2024-07-06: 1 via ORAL
  Filled 2024-07-05: qty 1

## 2024-07-05 MED ORDER — BUPIVACAINE HCL (PF) 0.5 % IJ SOLN
INTRAMUSCULAR | Status: DC | PRN
Start: 2024-07-05 — End: 2024-07-05
  Administered 2024-07-05: 10 mL via PERINEURAL

## 2024-07-05 MED ORDER — LOSARTAN POTASSIUM 50 MG PO TABS
100.0000 mg | ORAL_TABLET | Freq: Every day | ORAL | Status: DC
  Administered 2024-07-06: 100 mg via ORAL
  Filled 2024-07-05: qty 2

## 2024-07-05 MED ORDER — ACETAMINOPHEN 325 MG PO TABS: 325.0000 mg | ORAL_TABLET | Freq: Four times a day (QID) | ORAL | Status: DC | PRN

## 2024-07-05 MED ORDER — LIDOCAINE HCL (CARDIAC) PF 100 MG/5ML IV SOSY
PREFILLED_SYRINGE | INTRAVENOUS | Status: DC | PRN
  Administered 2024-07-05: 100 mg via INTRAVENOUS

## 2024-07-05 MED ORDER — LACTATED RINGERS IV SOLN: INTRAVENOUS | Status: DC

## 2024-07-05 MED ORDER — TRAMADOL HCL 50 MG PO TABS: 50.0000 mg | ORAL_TABLET | Freq: Four times a day (QID) | ORAL | 0 refills | Status: AC | PRN

## 2024-07-05 MED ORDER — ONDANSETRON HCL 4 MG/2ML IJ SOLN: 4.0000 mg | Freq: Four times a day (QID) | INTRAMUSCULAR | Status: DC | PRN

## 2024-07-05 MED ORDER — ONDANSETRON HCL 4 MG PO TABS: 4.0000 mg | ORAL_TABLET | Freq: Four times a day (QID) | ORAL | Status: DC | PRN

## 2024-07-05 MED ORDER — METHOCARBAMOL 500 MG PO TABS
ORAL_TABLET | ORAL | Status: AC
  Filled 2024-07-05: qty 1

## 2024-07-05 MED ORDER — SODIUM CHLORIDE 0.9 % IV SOLN: INTRAVENOUS | Status: DC

## 2024-07-05 MED ORDER — FENTANYL CITRATE (PF) 100 MCG/2ML IJ SOLN
INTRAMUSCULAR | Status: AC
  Filled 2024-07-05: qty 2

## 2024-07-05 MED ORDER — 0.9 % SODIUM CHLORIDE (POUR BTL) OPTIME
TOPICAL | Status: DC | PRN
  Administered 2024-07-05: 1000 mL

## 2024-07-05 MED ORDER — ALBUTEROL SULFATE (2.5 MG/3ML) 0.083% IN NEBU: 2.5000 mg | INHALATION_SOLUTION | RESPIRATORY_TRACT | Status: DC | PRN

## 2024-07-05 MED ORDER — LACTATED RINGERS IV SOLN: INTRAVENOUS | Status: DC | PRN

## 2024-07-05 MED ORDER — LETROZOLE 2.5 MG PO TABS
2.5000 mg | ORAL_TABLET | Freq: Every day | ORAL | Status: DC
  Filled 2024-07-05: qty 1

## 2024-07-05 MED ORDER — MIDAZOLAM HCL 2 MG/2ML IJ SOLN: 1.0000 mg | INTRAMUSCULAR | Status: DC

## 2024-07-05 MED ORDER — VITAMIN D 25 MCG (1000 UNIT) PO TABS
2000.0000 [IU] | ORAL_TABLET | Freq: Every day | ORAL | Status: DC
  Administered 2024-07-06: 2000 [IU] via ORAL
  Filled 2024-07-05: qty 2

## 2024-07-05 MED ORDER — FENTANYL CITRATE PF 50 MCG/ML IJ SOSY
25.0000 ug | PREFILLED_SYRINGE | INTRAMUSCULAR | Status: DC | PRN
  Administered 2024-07-05 (×2): 50 ug via INTRAVENOUS

## 2024-07-05 MED ORDER — PHENYLEPHRINE HCL-NACL 20-0.9 MG/250ML-% IV SOLN
INTRAVENOUS | Status: DC | PRN
  Administered 2024-07-05: 25 ug/min via INTRAVENOUS

## 2024-07-05 MED ORDER — HYDROCORTISONE 1 % EX CREA: 1.0000 | TOPICAL_CREAM | Freq: Two times a day (BID) | CUTANEOUS | Status: DC | PRN

## 2024-07-05 MED ORDER — BUPIVACAINE-EPINEPHRINE (PF) 0.25% -1:200000 IJ SOLN
INTRAMUSCULAR | Status: DC | PRN
  Administered 2024-07-05: 20 mL via PERINEURAL

## 2024-07-05 MED ORDER — METHOCARBAMOL 500 MG PO TABS
500.0000 mg | ORAL_TABLET | Freq: Four times a day (QID) | ORAL | Status: DC | PRN
  Administered 2024-07-05 – 2024-07-06 (×3): 500 mg via ORAL
  Filled 2024-07-05 (×2): qty 1

## 2024-07-05 MED ORDER — VANCOMYCIN HCL 1 G IV SOLR
INTRAVENOUS | Status: DC | PRN
  Administered 2024-07-05: 1000 mg via TOPICAL

## 2024-07-05 MED ORDER — SIMVASTATIN 20 MG PO TABS
10.0000 mg | ORAL_TABLET | Freq: Every day | ORAL | Status: DC
  Administered 2024-07-05: 10 mg via ORAL
  Filled 2024-07-05: qty 1

## 2024-07-05 MED ORDER — TRANEXAMIC ACID-NACL 1000-0.7 MG/100ML-% IV SOLN
1000.0000 mg | Freq: Once | INTRAVENOUS | Status: AC
  Administered 2024-07-05: 1000 mg via INTRAVENOUS
  Filled 2024-07-05: qty 100

## 2024-07-05 MED ORDER — FENTANYL CITRATE PF 50 MCG/ML IJ SOSY
PREFILLED_SYRINGE | INTRAMUSCULAR | Status: AC
  Filled 2024-07-05: qty 2

## 2024-07-05 MED ORDER — PROPOFOL 10 MG/ML IV BOLUS
INTRAVENOUS | Status: DC | PRN
  Administered 2024-07-05: 140 mg via INTRAVENOUS

## 2024-07-05 MED ORDER — DEXAMETHASONE SODIUM PHOSPHATE 10 MG/ML IJ SOLN
INTRAMUSCULAR | Status: AC
  Filled 2024-07-05: qty 1

## 2024-07-05 SURGICAL SUPPLY — 62 items
BAG COUNTER SPONGE SURGICOUNT (BAG) IMPLANT
BAG ZIPLOCK 12X15 (MISCELLANEOUS) IMPLANT
BIT DRILL 1.6MX128 (BIT) IMPLANT
BIT DRILL 170X2.5X (BIT) IMPLANT
BLADE SAG 18X100X1.27 (BLADE) ×1 IMPLANT
COVER BACK TABLE 60X90IN (DRAPES) ×1 IMPLANT
COVER SURGICAL LIGHT HANDLE (MISCELLANEOUS) ×1 IMPLANT
DRAPE INCISE IOBAN 66X45 STRL (DRAPES) ×1 IMPLANT
DRAPE POUCH INSTRU U-SHP 10X18 (DRAPES) ×1 IMPLANT
DRAPE SHEET LG 3/4 BI-LAMINATE (DRAPES) ×1 IMPLANT
DRAPE SURG ORHT 6 SPLT 77X108 (DRAPES) ×2 IMPLANT
DRAPE TOP 10253 STERILE (DRAPES) ×1 IMPLANT
DRAPE U-SHAPE 47X51 STRL (DRAPES) ×1 IMPLANT
DRSG ADAPTIC 3X8 NADH LF (GAUZE/BANDAGES/DRESSINGS) ×1 IMPLANT
DRSG AQUACEL AG ADV 3.5X10 (GAUZE/BANDAGES/DRESSINGS) ×1 IMPLANT
DURAPREP 26ML APPLICATOR (WOUND CARE) ×1 IMPLANT
ECCENTRIC EPIPHYSI MODULAR SZ1 (Trauma) IMPLANT
ELECT BLADE TIP CTD 4 INCH (ELECTRODE) ×1 IMPLANT
ELECT NDL TIP 2.8 STRL (NEEDLE) ×1 IMPLANT
ELECT NEEDLE TIP 2.8 STRL (NEEDLE) ×1 IMPLANT
ELECT PENCIL ROCKER SW 15FT (MISCELLANEOUS) ×1 IMPLANT
ELECT REM PT RETURN 15FT ADLT (MISCELLANEOUS) ×1 IMPLANT
FACESHIELD WRAPAROUND OR TEAM (MASK) ×1 IMPLANT
GAUZE PAD ABD 8X10 STRL (GAUZE/BANDAGES/DRESSINGS) ×1 IMPLANT
GAUZE SPONGE 4X4 12PLY STRL (GAUZE/BANDAGES/DRESSINGS) ×1 IMPLANT
GLENOSPHERE DELTA XTEND LAT 38 (Miscellaneous) IMPLANT
GLOVE BIOGEL PI IND STRL 7.5 (GLOVE) ×1 IMPLANT
GLOVE BIOGEL PI IND STRL 8.5 (GLOVE) ×1 IMPLANT
GLOVE ORTHO TXT STRL SZ7.5 (GLOVE) ×1 IMPLANT
GLOVE SURG ORTHO 8.5 STRL (GLOVE) ×1 IMPLANT
GOWN STRL REUS W/ TWL XL LVL3 (GOWN DISPOSABLE) ×2 IMPLANT
KIT BASIN OR (CUSTOM PROCEDURE TRAY) ×1 IMPLANT
KIT TURNOVER KIT A (KITS) ×1 IMPLANT
MANIFOLD NEPTUNE II (INSTRUMENTS) ×1 IMPLANT
METAGLENE DELTA EXTEND (Trauma) IMPLANT
NDL MAYO CATGUT SZ4 TPR NDL (NEEDLE) IMPLANT
NEEDLE MAYO CATGUT SZ4 (NEEDLE) IMPLANT
NS IRRIG 1000ML POUR BTL (IV SOLUTION) ×1 IMPLANT
PACK SHOULDER (CUSTOM PROCEDURE TRAY) ×1 IMPLANT
PIN GUIDE 1.2 (PIN) IMPLANT
PIN GUIDE GLENOPHERE 1.5MX300M (PIN) IMPLANT
PIN METAGLENE 2.5 (PIN) IMPLANT
RESTRAINT HEAD UNIVERSAL NS (MISCELLANEOUS) ×1 IMPLANT
SCREW LOCK 42 (Screw) IMPLANT
SCREW LOCK DELTA XTEND 4.5X30 (Screw) IMPLANT
SLING ARM FOAM STRAP LRG (SOFTGOODS) IMPLANT
SPACER 38 PLUS 3 (Spacer) IMPLANT
SPIKE FLUID TRANSFER (MISCELLANEOUS) ×1 IMPLANT
STEM 12 HA (Stem) IMPLANT
STRIP CLOSURE SKIN 1/2X4 (GAUZE/BANDAGES/DRESSINGS) ×1 IMPLANT
SUT MNCRL AB 4-0 PS2 18 (SUTURE) ×1 IMPLANT
SUT VIC AB 0 CT1 36 (SUTURE) ×1 IMPLANT
SUT VIC AB 0 CT2 27 (SUTURE) ×1 IMPLANT
SUT VIC AB 2-0 CT1 TAPERPNT 27 (SUTURE) ×1 IMPLANT
SUTURE FIBERWR #2 38 T-5 BLUE (SUTURE) ×2 IMPLANT
SUTURE FIBERWR#2 38 REV NDL BL (SUTURE) IMPLANT
TAPE CLOTH SURG 4X10 WHT LF (GAUZE/BANDAGES/DRESSINGS) IMPLANT
TAPE PAPER 3X10 WHT MICROPORE (GAUZE/BANDAGES/DRESSINGS) IMPLANT
TOWEL GREEN STERILE FF (TOWEL DISPOSABLE) ×1 IMPLANT
TOWEL OR 17X26 10 PK STRL BLUE (TOWEL DISPOSABLE) ×1 IMPLANT
WATER STERILE IRR 1000ML POUR (IV SOLUTION) IMPLANT
YANKAUER SUCT BULB TIP NO VENT (SUCTIONS) ×1 IMPLANT

## 2024-07-05 NOTE — Anesthesia Procedure Notes (Signed)
 Anesthesia Regional Block: Interscalene brachial plexus block   Pre-Anesthetic Checklist: , timeout performed,  Correct Patient, Correct Site, Correct Laterality,  Correct Procedure, Correct Position, site marked,  Risks and benefits discussed,  Surgical consent,  Pre-op evaluation,  At surgeon's request and post-op pain management  Laterality: Right  Prep: chloraprep       Needles:  Injection technique: Single-shot  Needle Type: Echogenic Stimulator Needle     Needle Length: 9cm  Needle Gauge: 22     Additional Needles:   Procedures:,,,, ultrasound used (permanent image in chart),,    Narrative:  Start time: 07/05/2024 2:40 PM End time: 07/05/2024 2:48 PM Injection made incrementally with aspirations every 5 mL.  Performed by: Personally  Anesthesiologist: Corinne Garnette BRAVO, MD  Additional Notes: Functioning IV was confirmed and monitors were applied.  A 90mm 22ga echogenic stimulator needle was used. Sterile prep and drape, hand hygiene, and sterile gloves were used.  Negative aspiration and negative test dose prior to incremental administration of local anesthetic. The patient tolerated the procedure well.  Ultrasound guidance: relevent anatomy identified, needle position confirmed, local anesthetic spread visualized around nerve(s), vascular puncture avoided.  Image printed for medical record.

## 2024-07-05 NOTE — Transfer of Care (Signed)
 Immediate Anesthesia Transfer of Care Note  Patient: Brooke Duncan  Procedure(s) Performed: ARTHROPLASTY, SHOULDER, TOTAL, REVERSE (Right: Shoulder)  Patient Location: PACU  Anesthesia Type:GA combined with regional for post-op pain  Level of Consciousness: awake and patient cooperative  Airway & Oxygen Therapy: Patient Spontanous Breathing and Patient connected to face mask  Post-op Assessment: Report given to RN and Post -op Vital signs reviewed and stable  Post vital signs: Reviewed and stable  Last Vitals:  Vitals Value Taken Time  BP 127/62 07/05/24 16:51  Temp    Pulse 62 07/05/24 16:54  Resp 18 07/05/24 16:54  SpO2 100 % 07/05/24 16:54  Vitals shown include unfiled device data.  Last Pain:  Vitals:   07/05/24 1237  TempSrc:   PainSc: 0-No pain         Complications: No notable events documented.

## 2024-07-05 NOTE — Op Note (Signed)
 NAME: Brooke Duncan, Brooke Duncan MEDICAL RECORD NO: [id number] ACCOUNT NO: [id number] DATE OF BIRTH: Sep 06, 1946 FACILITY: [facility] LOCATION: [id number] PHYSICIAN: Elspeth SAUNDERS. Kay, MD  Operative Report   DATE OF PROCEDURE: 07/05/2024  PREOPERATIVE DIAGNOSIS:  Right shoulder rotator cuff tear arthropathy.  POSTOPERATIVE DIAGNOSIS:  Right shoulder rotator cuff tear arthropathy.  PROCEDURE PERFORMED:  Right reverse total shoulder arthroplasty using DePuy Delta Xtend prosthesis.  ATTENDING SURGEON:  Elspeth SAUNDERS. Kay, MD  ASSISTANT:  Debby Crock [place], [place], who was scrubbed during the entire procedure, and necessary for satisfactory completion of surgery.  ANESTHESIA:  General anesthesia was used plus interscalene block.  ESTIMATED BLOOD LOSS:  100 mL.  FLUID REPLACEMENT:  1200 mL crystalloid.  INSTRUMENT COUNTS:  Correct.  COMPLICATIONS:  None.  ANTIBIOTICS:  Perioperative antibiotics were given.  INDICATIONS:  The patient is a 78 year old female who presents with a history of worsening right shoulder pain due to rotator cuff tear arthropathy.  The patient has failed an extended period of conservative management and desires operative treatment to  eliminate pain and restore function.  She has already had her left shoulder done and has done great with that.  Informed consent obtained.  DESCRIPTION OF PROCEDURE:  After an adequate level of general anesthesia was achieved plus interscalene block, the patient was positioned in the modified beach chair position.  Right shoulder correctly identified and sterile prep and drape performed.   Timeout called verifying correct patient, correct site.  We entered the shoulder using a standard deltopectoral incision starting at the coracoid process and extending down to the anterior humerus.  Dissection down through the subcutaneous tissues using  Bovie.  Cephalic vein identified and taken lateral to the deltoid.  Pectoralis was taken medially.  Conjoint tendon  was identified and retracted medially.  We placed our deep retractors.  We then tenodesed the biceps in situ with 0 Vicryl figure-of-eight  suture x2, incorporating part of the pec tendon.  We then released the subscapularis subperiosteally off the lesser tuberosity.  This was stiff and scarred and not really amenable to repair, but we tagged it for protection of the axillary nerve.  Once  we released the inferior capsule, we extended the shoulder, delivering the humeral head out of the wound.  We then entered the proximal humerus with a 6 mm reamer.  The head was devoid of cartilage.  We reamed it up to a size 12.  We then placed our 12  mm T-handle guide and resected the head at 20 degrees of retroversion with the oscillating saw.  Next, we removed osteophytes with a rongeur.  We then subluxed the humerus posteriorly, that gave us  good exposure of the glenoid.  We removed the capsule,  labrum, and biceps anchor.  Once we had good exposure, we drilled our guide pin for the Metaglene reamer centered low on the glenoid.  We then reamed the subchondral bone.  Once we did that, we did our peripheral hand reamer and then drilled out our  central peg hole.  We impacted the HA-coated and press-fit baseplate into position.  We placed a 42 screw inferiorly, a 30 screw superiorly with good purchase of both screws.  The patient's glenoid was small enough that we can only get two screws.  The  baseplate was secured.  We selected a real 38 +0 standard glenosphere and attached that to the baseplate with a screwdriver.  Once the baseplate was secured, I did a finger sweep to make sure we had no soft tissue  caught up underneath the glenosphere.   At this point, we went back to the humeral side and reamed for the 1 right metaphysis.  We then trialed with a 12 stem and the 1 right metaphysis set on the 0 setting and trialed in 20 degrees of retroversion.  We placed a 38 +3 poly trial onto the  humeral tray and reduced the  shoulder.  We had a nice stable shoulder.  No gapping with inferior pole.  No gapping with external rotation and appropriate conjoint tensioning.  We removed all trial components, irrigated thoroughly.  We used available bone  graft from the humeral head in impaction grafting technique.  We impacted the HA-coated and press-fit 12 stem and the 1 right metaphysis set in the 0 setting and impacted in 20 degrees of retroversion.  With the stem seated and stable, we selected the  real 38 +3 poly and placed on the humeral tray and impacted that.  We reduced the shoulder with a nice little pop as it reduced and examined the shoulder throughout a full range of motion.  Excellent stability throughout.  Appropriate conjoint tension.   We irrigated again and resected the subscap remnant.  We went ahead and placed 1 g of vancomycin powder inside the shoulder joint.  We then closed the deltopectoral interval with 0 Vicryl suture followed by 2-0 Vicryl for subcutaneous closure and 4-0  Monocryl for skin.  Steri-Strips applied followed by a sterile dressing and a shoulder sling.  The patient was transported to the recovery room in stable condition, having tolerated the surgery well.   VAI D: 07/05/2024 4:48:19 pm T: 07/05/2024 11:22:00 pm  JOB: [id number]/ [id number]

## 2024-07-05 NOTE — Interval H&P Note (Signed)
 History and Physical Interval Note:  07/05/2024 1:03 PM  Brooke Duncan  has presented today for surgery, with the diagnosis of Right shoulder rotator cuff arthropathy.  The various methods of treatment have been discussed with the patient and family. After consideration of risks, benefits and other options for treatment, the patient has consented to  Procedure(s): ARTHROPLASTY, SHOULDER, TOTAL, REVERSE (Right) as a surgical intervention.  The patient's history has been reviewed, patient examined, no change in status, stable for surgery.  I have reviewed the patient's chart and labs.  Questions were answered to the patient's satisfaction.     [name]

## 2024-07-05 NOTE — Care Plan (Signed)
 Ortho Bundle Case Management Note  Patient Details  Name: Brooke Duncan MRN: [id number] Date of Birth: 01-Dec-1946  R Rev TSA on 07/05/24  DCP: Home with nephew DME: No needs PT: HEP                   DME Arranged:  N/A DME Agency:  NA  HH Arranged:    HH Agency:     Additional Comments: Please contact me with any questions of if this plan should need to change.  Lyle Pepper, [place] [facility] [phone]   07/05/2024, 11:07 AM

## 2024-07-05 NOTE — Brief Op Note (Signed)
 07/05/2024  4:40 PM  PATIENT:  Sharlet JINNY Bouche  78 y.o. female  PRE-OPERATIVE DIAGNOSIS:  Right shoulder rotator cuff arthropathy  POST-OPERATIVE DIAGNOSIS:  Right shoulder rotator cuff arthropathy  PROCEDURE:  Procedure(s): ARTHROPLASTY, SHOULDER, TOTAL, REVERSE (Right)  SURGEON:  Surgeons and Role:    DEWAINE Kay Kemps, MD - Primary  PHYSICIAN ASSISTANT:   ASSISTANTS: Debby KATHEE Fireman, PA-C   ANESTHESIA:   regional and general  EBL:  50 mL   BLOOD ADMINISTERED:none  DRAINS: none   LOCAL MEDICATIONS USED:  MARCAINE      SPECIMEN:  No Specimen  DISPOSITION OF SPECIMEN:  N/A  COUNTS:  YES  TOURNIQUET:  * No tourniquets in log *  DICTATION: .Other Dictation: Dictation Number [id number]  PLAN OF CARE: Admit for overnight observation  PATIENT DISPOSITION:  PACU - hemodynamically stable.   Delay start of Pharmacological VTE agent (>24hrs) due to surgical blood loss or risk of bleeding: not applicable

## 2024-07-05 NOTE — Anesthesia Postprocedure Evaluation (Signed)
 Anesthesia Post Note  Patient: Brooke Duncan  Procedure(s) Performed: ARTHROPLASTY, SHOULDER, TOTAL, REVERSE (Right: Shoulder)     Patient location during evaluation: PACU Anesthesia Type: General Level of consciousness: awake and alert Pain management: pain level controlled Vital Signs Assessment: post-procedure vital signs reviewed and stable Respiratory status: spontaneous breathing, nonlabored ventilation and respiratory function stable Cardiovascular status: blood pressure returned to baseline and stable Postop Assessment: no apparent nausea or vomiting Anesthetic complications: no   No notable events documented.  Last Vitals:  Vitals:   07/05/24 1815 07/05/24 1848  BP: (!) 140/74 (!) 160/73  Pulse: 67 78  Resp: 18 17  Temp:    SpO2: 97% 93%    Last Pain:  Vitals:   07/05/24 1815  TempSrc:   PainSc: 4                  Garnette FORBES Skillern

## 2024-07-05 NOTE — Discharge Instructions (Signed)
Ice to the shoulder constantly.  Keep the incision covered and clean and dry for one week, then ok to get it wet in the shower.  Do exercise as instructed several times per day.  DO NOT reach behind your back or push up out of a chair with the operative arm.  Use a sling while you are up and around for comfort, may remove while seated.  Keep pillow propped behind the operative elbow.  Follow up with Dr Ranell Patrick in two weeks in the office, call [phone] for appt  Please call Dr Ranell Patrick (cell) [phone] with any questions or concerns

## 2024-07-05 NOTE — Progress Notes (Signed)
   07/05/24 2233  BiPAP/CPAP/SIPAP  BiPAP/CPAP/SIPAP Pt Type Adult  Reason BIPAP/CPAP not in use Non-compliant

## 2024-07-05 NOTE — Anesthesia Procedure Notes (Signed)
 Procedure Name: Intubation Date/Time: 07/05/2024 3:17 PM  Performed by: Dartha Meckel, CRNAPre-anesthesia Checklist: Patient identified, Emergency Drugs available, Suction available and Patient being monitored Patient Re-evaluated:Patient Re-evaluated prior to induction Oxygen Delivery Method: Circle system utilized Preoxygenation: Pre-oxygenation with 100% oxygen Induction Type: IV induction Ventilation: Mask ventilation without difficulty Laryngoscope Size: Mac and 3 Grade View: Grade I Tube type: Oral Tube size: 7.0 mm Number of attempts: 1 Airway Equipment and Method: Stylet and Oral airway Placement Confirmation: ETT inserted through vocal cords under direct vision, positive ETCO2 and breath sounds checked- equal and bilateral Secured at: 21 cm Tube secured with: Tape Dental Injury: Teeth and Oropharynx as per pre-operative assessment

## 2024-07-06 NOTE — Discharge Summary (Signed)
 In most cases prophylactic antibiotics for Dental procdeures after total joint surgery are not necessary.  Exceptions are as follows:  1. History of prior total joint infection  2. Severely immunocompromised (Organ Transplant, cancer chemotherapy, Rheumatoid biologic meds such as Humera)  3. Poorly controlled diabetes (A1C &gt; 8.0, blood glucose over 200)  If you have one of these conditions, contact your surgeon for an antibiotic prescription, prior to your dental procedure. Orthopedic Discharge Summary        Physician Discharge Summary  Patient ID: Brooke Duncan MRN: [id number] DOB/AGE: 11-Aug-1946 78 y.o.  Admit date: 07/05/2024 Discharge date: 07/06/2024   Procedures:  Procedure(s) (LRB): ARTHROPLASTY, SHOULDER, TOTAL, REVERSE (Right)  Attending Physician:  Dr. Elspeth Her  Admission Diagnoses:   right shoulder cuff arthropathy  Discharge Diagnoses:  right shoulder cuff arthropathy   Past Medical History:  Diagnosis Date   Acquired solitary kidney    right side 2014   Arthritis    rt knee rt shoulder   Bronchitis 05/08/2023   Cancer (HCC)    renal cell carcinoma- left kidney removed and left breast cancer   Complication of anesthesia    slow to wake, hard to take a deep breath with 1 surgery several yrs ago   Foley catheter in place    Frequency of urination    Heart murmur    per told by pcp approx. 02/ 2022 heard a very faint murmur, told did need work-up done at this time   History of COVID-19 2021   all symptoms resolved   History of Hashimoto thyroiditis    s/p  total thyroidectomy 1995   History of hypercalcemia    s/p  parathryoidectomy 07/ 2021   History of kidney stones    History of renal cell carcinoma 2014   s/p  left nephrectomy, pt stated no other treatment   Hypertension    followed by pcp   Hypothyroidism, postsurgical 1995   followed by pcp in [place], moved from [place]  01/ 2021   OSA on CPAP    Thickened endometrium     UTI (urinary tract infection)    finished keflex  08-12-2022   Wears glasses     PCP: Okey Carlin Redbird, MD   Discharged Condition: good  Hospital Course:  Patient underwent the above stated procedure on 07/05/2024. Patient tolerated the procedure well and brought to the recovery room in good condition and subsequently to the floor. Patient had an uncomplicated hospital course and was stable for discharge.   Disposition: Discharge disposition: 01-Home or Self Care      with follow up in 2 weeks    Follow-up Information     Melvenia Debby Cain, PA-C. Go on 07/18/2024.   Specialty: Physician Assistant Why: You are scheduled for a post op appointment on Thursday 07/18/24 at 3:00pm Contact information: [address] [place] [address] [phone]         Her Kemps, MD. Call in 2 week(s).   Specialty: Orthopedic Surgery Why: please call for appt in the office in two weeks, [phone] Contact information: [address] [place] [address] [phone]                 Dental Antibiotics:  In most cases prophylactic antibiotics for Dental procdeures after total joint surgery are not necessary.  Exceptions are as follows:  1. History of prior total joint infection  2. Severely immunocompromised (Organ Transplant, cancer chemotherapy, Rheumatoid biologic meds such as [place])  3. Poorly controlled diabetes (A1C &gt; 8.0, blood glucose over 200)  If you have one of these conditions, contact your surgeon for an antibiotic prescription, prior to your dental procedure.  Discharge Instructions     Call MD / Call 911   Complete by: As directed    If you experience chest pain or shortness of breath, CALL 911 and be transported to the hospital emergency room.  If you develope a fever above 101 F, pus (white drainage) or increased drainage or redness at the wound, or calf pain, call your surgeon's office.   Constipation  Prevention   Complete by: As directed    Drink plenty of fluids.  Prune juice may be helpful.  You may use a stool softener, such as Colace (over the counter) 100 mg twice a day.  Use MiraLax  (over the counter) for constipation as needed.   Diet - low sodium heart healthy   Complete by: As directed    Increase activity slowly as tolerated   Complete by: As directed    Post-operative opioid taper instructions:   Complete by: As directed    POST-OPERATIVE OPIOID TAPER INSTRUCTIONS: It is important to wean off of your opioid medication as soon as possible. If you do not need pain medication after your surgery it is ok to stop day one. Opioids include: Codeine, Hydrocodone (Norco, Vicodin), Oxycodone (Percocet, oxycontin ) and hydromorphone  amongst others.  Long term and even short term use of opiods can cause: Increased pain response Dependence Constipation Depression Respiratory depression And more.  Withdrawal symptoms can include Flu like symptoms Nausea, vomiting And more Techniques to manage these symptoms Hydrate well Eat regular healthy meals Stay active Use relaxation techniques(deep breathing, meditating, yoga) Do Not substitute Alcohol to help with tapering If you have been on opioids for less than two weeks and do not have pain than it is ok to stop all together.  Plan to wean off of opioids This plan should start within one week post op of your joint replacement. Maintain the same interval or time between taking each dose and first decrease the dose.  Cut the total daily intake of opioids by one tablet each day Next start to increase the time between doses. The last dose that should be eliminated is the evening dose.          Allergies as of 07/06/2024       Reactions   Wound Dressing Adhesive Rash   Adhesive tape causes rash, can use papee tape   Tape Rash, Other (See Comments)   Blistery rash DERMABOND        Medication List     TAKE these medications     acetaminophen  500 MG tablet Commonly known as: TYLENOL  Take 2 tablets (1,000 mg total) by mouth every 6 (six) hours as needed.   albuterol  108 (90 Base) MCG/ACT inhaler Commonly known as: VENTOLIN  HFA Inhale 2 puffs into the lungs every 4 (four) hours as needed for shortness of breath or wheezing.   clotrimazole  1 % cream Commonly known as: LOTRIMIN  Apply 1 application  topically 2 (two) times daily as needed (irritation).   CRANBERRY PO Take 2 capsules by mouth daily.   cyanocobalamin  1000 MCG tablet Commonly known as: VITAMIN B12 Take 1,000 mcg by mouth daily.   D-MANNOSE PO Take 4 capsules by mouth daily.   erythromycin  ophthalmic ointment Place 1 application  into both eyes daily as needed (blepharitis).   hydrocortisone  2.5 % cream Apply 1 Application topically 2 (two)  times daily as needed (irritation).   letrozole  2.5 MG tablet Commonly known as: FEMARA  Take 1 tablet (2.5 mg total) by mouth daily.   levothyroxine  150 MCG tablet Commonly known as: SYNTHROID  Take 150 mcg by mouth daily before breakfast.   losartan  100 MG tablet Commonly known as: COZAAR  Take 100 mg by mouth daily.   methocarbamol  500 MG tablet Commonly known as: ROBAXIN  Take 1 tablet (500 mg total) by mouth every 8 (eight) hours as needed for muscle spasms.   NON FORMULARY Pt uses a cpap nightly   ondansetron  4 MG tablet Commonly known as: Zofran  Take 1 tablet (4 mg total) by mouth every 8 (eight) hours as needed for nausea, vomiting or refractory nausea / vomiting.   oxybutynin  5 MG tablet Commonly known as: DITROPAN  Take 1 tablet (5 mg total) by mouth every 8 (eight) hours as needed for bladder spasms.   PROBIOTIC DAILY PO Take 1 capsule by mouth daily.   simvastatin  10 MG tablet Commonly known as: ZOCOR  Take 10 mg by mouth at bedtime.   tamsulosin  0.4 MG Caps capsule Commonly known as: FLOMAX  Take 1 capsule (0.4 mg total) by mouth daily.   traMADol  50 MG tablet Commonly  known as: Ultram  Take 1 tablet (50 mg total) by mouth every 6 (six) hours as needed.   VITAMIN C  PO Take 1 tablet by mouth daily.   Vitamin D3 Super Strength 50 MCG (2000 UT) Caps Generic drug: Cholecalciferol  Take 2,000 Units by mouth daily.          Signed: Debby KATHEE Fireman 07/06/2024, 7:25 AM  [facility] is now [facility] [address], [address], [place], [place] [address] Phone: [phone] Facebook  Instagram  [employer]

## 2024-07-06 NOTE — Progress Notes (Signed)
 Discharge instructions discussed with patient, verbalized agreement and understanding

## 2024-07-06 NOTE — Plan of Care (Signed)
   Problem: Health Behavior/Discharge Planning: Goal: Ability to manage health-related needs will improve Outcome: Progressing   Problem: Clinical Measurements: Goal: Ability to maintain clinical measurements within normal limits will improve Outcome: Progressing Goal: Will remain free from infection Outcome: Progressing

## 2024-07-06 NOTE — Progress Notes (Signed)
   Subjective: 1 Day Post-Op Procedure(s) (LRB): ARTHROPLASTY, SHOULDER, TOTAL, REVERSE (Right)  Pt lying comfortably in no acute distress No events overnight Denies any new symptoms or issues Patient reports pain as mild.  Objective:   VITALS:   Vitals:   07/06/24 0217 07/06/24 0628  BP: (!) 161/77 (!) 168/86  Pulse: 64 77  Resp: 13 18  Temp: 97.7 F (36.5 C) 98.1 F (36.7 C)  SpO2: 96% 95%    Right shoulder: incision healing well Dressing intact No rashes or edema distally Sling in good position  LABS No results for input(s): HGB, HCT, WBC, PLT in the last 72 hours.  No results for input(s): NA, K, BUN, CREATININE, GLUCOSE in the last 72 hours.   Assessment/Plan: 1 Day Post-Op Procedure(s) (LRB): ARTHROPLASTY, SHOULDER, TOTAL, REVERSE (Right) D/c home today F/u in 2 weeks in the office Gentle activity with the right upper extremity as tolerated May use walker with the right arm   Brad Melvenia RIGGERS, MPAS [facility] is now [facility] [address], [place], [place] [address] Phone: [phone] [email or web address] Facebook  [employer]

## 2024-07-06 NOTE — Progress Notes (Signed)
   07/06/24 1020  TOC Brief Assessment  Insurance and Status Reviewed  Patient has primary care physician Yes  Home environment has been reviewed Home w/ family  Prior level of function: mod independent  Prior/Current Home Services No current home services  Social Drivers of Health Review SDOH reviewed no interventions necessary  Readmission risk has been reviewed Yes  Transition of care needs no transition of care needs at this time   DCP: Home with nephew DME: No needs PT: HEP

## 2024-07-06 NOTE — Evaluation (Signed)
 Occupational Therapy Evaluation Patient Details Name: Brooke Duncan MRN: [id number] DOB: 08/27/1946 Today's Date: 07/06/2024   History of Present Illness   Pt is a 78 yr old female who presented 07/05/24 and is s/p R reverse total shoulder shoulder arthroplasty. PMH: UTI, Total hip sx, R THA, L total shoulder     Clinical Impressions Pt reported at PLOF they were living alone using a 4WW. However, she reported she will have assistance for the first week with the return to home and then her niece will come by once a day to assist. Pt at this time went over dressing, exercises and precautions at this time. Pt reports they are going to sleep in her recliner and only wear house dresses with the return to home. Pt was able to complete sit to stand transfers with supervision to CGA and ambulated using 4WW in room with supervision with o2 84-91% on RA. Acute Occupational Therapy to follow.      If plan is discharge home, recommend the following:   A little help with walking and/or transfers;A little help with bathing/dressing/bathroom;Assistance with cooking/housework;Assist for transportation;Help with stairs or ramp for entrance     Functional Status Assessment   Patient has had a recent decline in their functional status and demonstrates the ability to make significant improvements in function in a reasonable and predictable amount of time.     Equipment Recommendations   None recommended by OT     Recommendations for Other Services         Precautions/Restrictions   Precautions Precautions: Shoulder;Fall Type of Shoulder Precautions: NWB but per ortho note able to use walker, NO AROM/PROM at shoulder and sling at all times except ADLS and therapy exercises Shoulder Interventions: Shoulder sling/immobilizer Precaution Booklet Issued: Yes (comment) Recall of Precautions/Restrictions: Intact Required Braces or Orthoses: Sling Restrictions Weight Bearing Restrictions Per  Provider Order: Yes RUE Weight Bearing Per Provider Order: Non weight bearing Other Position/Activity Restrictions: but can use walker per md note     Mobility Bed Mobility Overal bed mobility: Needs Assistance Bed Mobility: Rolling, Supine to Sit Rolling: Min assist   Supine to sit: Min assist          Transfers Overall transfer level: Needs assistance Equipment used: Rollator (4 wheels) Transfers: Sit to/from Stand Sit to Stand: Contact guard assist                  Balance Overall balance assessment: Needs assistance Sitting-balance support: Feet supported Sitting balance-Leahy Scale: Good     Standing balance support: Single extremity supported, Bilateral upper extremity supported Standing balance-Leahy Scale: Fair                             ADL either performed or assessed with clinical judgement   ADL Overall ADL's : Needs assistance/impaired Eating/Feeding: Set up;Sitting   Grooming: Contact guard assist;Sitting   Upper Body Bathing: Contact guard assist;Minimal assistance;Sitting   Lower Body Bathing: Minimal assistance;Sit to/from stand   Upper Body Dressing : Minimal assistance;Sitting   Lower Body Dressing: Minimal assistance;Sit to/from stand   Toilet Transfer: Social research officer, government (4 wheels)   Toileting- Architect and Hygiene: Contact guard assist;Sit to/from stand       Functional mobility during ADLs: Contact guard assist;Rollator (4 wheels)       Vision Baseline Vision/History: 1 Wears glasses Ability to See in Adequate Light: 0 Adequate Patient Visual Report: No change from baseline  Vision Assessment?: No apparent visual deficits     Perception Perception: Within Functional Limits       Praxis         Pertinent Vitals/Pain       Extremity/Trunk Assessment Upper Extremity Assessment Upper Extremity Assessment: RUE deficits/detail RUE Deficits / Details: R reverse total shoulder  sx RUE: Unable to fully assess due to immobilization RUE Sensation: decreased light touch (due to nerve block) RUE Coordination: decreased fine motor;decreased gross motor   Lower Extremity Assessment Lower Extremity Assessment: Defer to PT evaluation   Cervical / Trunk Assessment Cervical / Trunk Assessment: Kyphotic   Communication Communication Communication: No apparent difficulties   Cognition Arousal: Alert Behavior During Therapy: WFL for tasks assessed/performed Cognition: No apparent impairments                               Following commands: Intact       Cueing  General Comments   Cueing Techniques: Verbal cues      Exercises     Shoulder Instructions      Home Living Family/patient expects to be discharged to:: Private residence Living Arrangements: Alone Available Help at Discharge: Family Type of Home: House Home Access: Level entry     Home Layout: One level     Bathroom Shower/Tub: Runner, broadcasting/film/video: Rollator (4 wheels);Shower seat          Prior Functioning/Environment Prior Level of Function : Independent/Modified Independent             Mobility Comments: 4WW ADLs Comments: mod I    OT Problem List: Decreased strength;Decreased range of motion;Decreased activity tolerance;Impaired balance (sitting and/or standing);Decreased safety awareness;Decreased knowledge of use of DME or AE;Pain   OT Treatment/Interventions: Self-care/ADL training;Therapeutic exercise;DME and/or AE instruction;Therapeutic activities;Patient/family education;Balance training      OT Goals(Current goals can be found in the care plan section)   Acute Rehab OT Goals Patient Stated Goal: to go home OT Goal Formulation: With patient Time For Goal Achievement: 07/06/24 Potential to Achieve Goals: Good   OT Frequency:  Min 2X/week    Co-evaluation              AM-PAC OT 6 Clicks Daily Activity     Outcome  Measure Help from another person eating meals?: A Little Help from another person taking care of personal grooming?: A Little Help from another person toileting, which includes using toliet, bedpan, or urinal?: A Little Help from another person bathing (including washing, rinsing, drying)?: A Little Help from another person to put on and taking off regular upper body clothing?: A Little Help from another person to put on and taking off regular lower body clothing?: A Little 6 Click Score: 18   End of Session Equipment Utilized During Treatment: Rollator (4 wheels);Gait belt Nurse Communication: Mobility status  Activity Tolerance: Patient tolerated treatment well Patient left: in chair;with call bell/phone within reach  OT Visit Diagnosis: Unsteadiness on feet (R26.81);Other abnormalities of gait and mobility (R26.89);Repeated falls (R29.6);Muscle weakness (generalized) (M62.81);Pain Pain - Right/Left: Right Pain - part of body: Shoulder                Time: 0818-0900 OT Time Calculation (min): 42 min Charges:  OT General Charges $OT Visit: 1 Visit OT Evaluation $OT Eval Low Complexity: 1 Low OT Treatments $Self Care/Home Management : 23-37 mins  Beulah Matusek K OTR/L  Acute Rehab Services  [phone] office number   Warrick Berber 07/06/2024, 9:10 AM

## 2024-07-08 ENCOUNTER — Emergency Department (HOSPITAL_COMMUNITY)

## 2024-07-08 ENCOUNTER — Inpatient Hospital Stay (HOSPITAL_COMMUNITY)
Admission: EM | Admit: 2024-07-08 | Discharge: 2024-07-10 | DRG: 981 | Disposition: A | Discharge status: Home Health | Attending: Internal Medicine | Admitting: Internal Medicine

## 2024-07-08 ENCOUNTER — Other Ambulatory Visit: Payer: Self-pay

## 2024-07-08 ENCOUNTER — Inpatient Hospital Stay (HOSPITAL_COMMUNITY)

## 2024-07-08 ENCOUNTER — Encounter (HOSPITAL_COMMUNITY): Payer: Self-pay | Admitting: Internal Medicine

## 2024-07-08 DIAGNOSIS — I2489 Other forms of acute ischemic heart disease: Secondary | ICD-10-CM

## 2024-07-08 DIAGNOSIS — Z87891 Personal history of nicotine dependence: Secondary | ICD-10-CM | POA: Diagnosis not present

## 2024-07-08 DIAGNOSIS — I5030 Unspecified diastolic (congestive) heart failure: Secondary | ICD-10-CM

## 2024-07-08 DIAGNOSIS — I110 Hypertensive heart disease with heart failure: Secondary | ICD-10-CM | POA: Diagnosis present

## 2024-07-08 DIAGNOSIS — Z96612 Presence of left artificial shoulder joint: Secondary | ICD-10-CM | POA: Diagnosis present

## 2024-07-08 DIAGNOSIS — Z8616 Personal history of COVID-19: Secondary | ICD-10-CM | POA: Diagnosis not present

## 2024-07-08 DIAGNOSIS — Z79890 Hormone replacement therapy (postmenopausal): Secondary | ICD-10-CM

## 2024-07-08 DIAGNOSIS — Z1152 Encounter for screening for COVID-19: Secondary | ICD-10-CM

## 2024-07-08 DIAGNOSIS — E039 Hypothyroidism, unspecified: Secondary | ICD-10-CM | POA: Diagnosis present

## 2024-07-08 DIAGNOSIS — I214 Non-ST elevation (NSTEMI) myocardial infarction: Secondary | ICD-10-CM

## 2024-07-08 DIAGNOSIS — Z87442 Personal history of urinary calculi: Secondary | ICD-10-CM

## 2024-07-08 DIAGNOSIS — K5900 Constipation, unspecified: Secondary | ICD-10-CM | POA: Diagnosis present

## 2024-07-08 DIAGNOSIS — E669 Obesity, unspecified: Secondary | ICD-10-CM | POA: Diagnosis present

## 2024-07-08 DIAGNOSIS — Z85528 Personal history of other malignant neoplasm of kidney: Secondary | ICD-10-CM | POA: Diagnosis not present

## 2024-07-08 DIAGNOSIS — Z8249 Family history of ischemic heart disease and other diseases of the circulatory system: Secondary | ICD-10-CM | POA: Diagnosis not present

## 2024-07-08 DIAGNOSIS — D849 Immunodeficiency, unspecified: Secondary | ICD-10-CM | POA: Diagnosis present

## 2024-07-08 DIAGNOSIS — R7989 Other specified abnormal findings of blood chemistry: Secondary | ICD-10-CM | POA: Diagnosis not present

## 2024-07-08 DIAGNOSIS — E1165 Type 2 diabetes mellitus with hyperglycemia: Secondary | ICD-10-CM | POA: Diagnosis present

## 2024-07-08 DIAGNOSIS — I50813 Acute on chronic right heart failure: Secondary | ICD-10-CM

## 2024-07-08 DIAGNOSIS — Z853 Personal history of malignant neoplasm of breast: Secondary | ICD-10-CM | POA: Diagnosis not present

## 2024-07-08 DIAGNOSIS — G4733 Obstructive sleep apnea (adult) (pediatric): Secondary | ICD-10-CM | POA: Diagnosis present

## 2024-07-08 DIAGNOSIS — I5082 Biventricular heart failure: Secondary | ICD-10-CM | POA: Diagnosis present

## 2024-07-08 DIAGNOSIS — I10 Essential (primary) hypertension: Secondary | ICD-10-CM

## 2024-07-08 DIAGNOSIS — R0600 Dyspnea, unspecified: Secondary | ICD-10-CM

## 2024-07-08 DIAGNOSIS — E785 Hyperlipidemia, unspecified: Secondary | ICD-10-CM | POA: Diagnosis present

## 2024-07-08 DIAGNOSIS — Z6841 Body Mass Index (BMI) 40.0 and over, adult: Secondary | ICD-10-CM | POA: Diagnosis not present

## 2024-07-08 DIAGNOSIS — Z96652 Presence of left artificial knee joint: Secondary | ICD-10-CM | POA: Diagnosis present

## 2024-07-08 DIAGNOSIS — Z79899 Other long term (current) drug therapy: Secondary | ICD-10-CM | POA: Diagnosis not present

## 2024-07-08 DIAGNOSIS — I5031 Acute diastolic (congestive) heart failure: Secondary | ICD-10-CM | POA: Diagnosis present

## 2024-07-08 DIAGNOSIS — Z604 Social exclusion and rejection: Secondary | ICD-10-CM | POA: Diagnosis present

## 2024-07-08 DIAGNOSIS — Z91048 Other nonmedicinal substance allergy status: Secondary | ICD-10-CM

## 2024-07-08 DIAGNOSIS — Z96643 Presence of artificial hip joint, bilateral: Secondary | ICD-10-CM | POA: Diagnosis present

## 2024-07-08 DIAGNOSIS — Z9049 Acquired absence of other specified parts of digestive tract: Secondary | ICD-10-CM

## 2024-07-08 DIAGNOSIS — Z808 Family history of malignant neoplasm of other organs or systems: Secondary | ICD-10-CM

## 2024-07-08 DIAGNOSIS — Z8042 Family history of malignant neoplasm of prostate: Secondary | ICD-10-CM

## 2024-07-08 DIAGNOSIS — M75101 Unspecified rotator cuff tear or rupture of right shoulder, not specified as traumatic: Secondary | ICD-10-CM | POA: Diagnosis present

## 2024-07-08 DIAGNOSIS — Z905 Acquired absence of kidney: Secondary | ICD-10-CM

## 2024-07-08 DIAGNOSIS — Z96611 Presence of right artificial shoulder joint: Secondary | ICD-10-CM | POA: Diagnosis present

## 2024-07-08 DIAGNOSIS — Z91199 Patient's noncompliance with other medical treatment and regimen due to unspecified reason: Secondary | ICD-10-CM | POA: Diagnosis not present

## 2024-07-08 LAB — BASIC METABOLIC PANEL WITH GFR
Anion gap: 10 (ref 5–15)
BUN: 11 mg/dL (ref 8–23)
CO2: 26 mmol/L (ref 22–32)
Calcium: 8.8 mg/dL — ABNORMAL LOW (ref 8.9–10.3)
Chloride: 99 mmol/L (ref 98–111)
Creatinine, Ser: 0.61 mg/dL (ref 0.44–1.00)
GFR, Estimated: >60 mL/min (ref >60)
Glucose, Bld: 114 mg/dL — ABNORMAL HIGH (ref 70–99)
Potassium: 4.2 mmol/L (ref 3.5–5.1)
Sodium: 135 mmol/L (ref 135–145)

## 2024-07-08 LAB — TROPONIN I (HIGH SENSITIVITY)
Troponin I (High Sensitivity): 355 ng/L (ref <18)
Troponin I (High Sensitivity): 262 ng/L (ref <18)
Troponin I (High Sensitivity): 222 ng/L (ref <18)
Troponin I (High Sensitivity): 213 ng/L (ref <18)

## 2024-07-08 LAB — MAGNESIUM: Magnesium: 1.8 mg/dL (ref 1.7–2.4)

## 2024-07-08 LAB — CBC WITH DIFFERENTIAL/PLATELET
Abs Immature Granulocytes: 0.05 K/uL (ref 0.00–0.07)
Basophils Absolute: 0 K/uL (ref 0.0–0.1)
Basophils Relative: 0 %
Eosinophils Absolute: 0.1 K/uL (ref 0.0–0.5)
Eosinophils Relative: 1 %
HCT: 39.7 % (ref 36.0–46.0)
Hemoglobin: 12.5 g/dL (ref 12.0–15.0)
Immature Granulocytes: 1 %
Lymphocytes Relative: 14 %
Lymphs Abs: 1.4 K/uL (ref 0.7–4.0)
MCH: 28 pg (ref 26.0–34.0)
MCHC: 31.5 g/dL (ref 30.0–36.0)
MCV: 88.8 fL (ref 80.0–100.0)
Monocytes Absolute: 0.8 K/uL (ref 0.1–1.0)
Monocytes Relative: 8 %
Neutro Abs: 7.6 K/uL (ref 1.7–7.7)
Neutrophils Relative %: 76 %
Platelets: 144 K/uL — ABNORMAL LOW (ref 150–400)
RBC: 4.47 MIL/uL (ref 3.87–5.11)
RDW: 16.1 % — ABNORMAL HIGH (ref 11.5–15.5)
WBC: 10 K/uL (ref 4.0–10.5)
nRBC: 0 % (ref 0.0–0.2)

## 2024-07-08 LAB — BLOOD GAS, VENOUS
Acid-Base Excess: 3.7 mmol/L — ABNORMAL HIGH (ref 0.0–2.0)
Bicarbonate: 29.7 mmol/L — ABNORMAL HIGH (ref 20.0–28.0)
O2 Saturation: 83.1 %
Patient temperature: 37
pCO2, Ven: 49 mmHg (ref 44–60)
pH, Ven: 7.39 (ref 7.25–7.43)
pO2, Ven: 47 mmHg — ABNORMAL HIGH (ref 32–45)

## 2024-07-08 LAB — RESP PANEL BY RT-PCR (RSV, FLU A&B, COVID)  RVPGX2
Influenza A by PCR: NEGATIVE
Influenza B by PCR: NEGATIVE
Resp Syncytial Virus by PCR: NEGATIVE
SARS Coronavirus 2 by RT PCR: NEGATIVE

## 2024-07-08 LAB — GLUCOSE, CAPILLARY
Glucose-Capillary: 87 mg/dL (ref 70–99)
Glucose-Capillary: 110 mg/dL — ABNORMAL HIGH (ref 70–99)
Glucose-Capillary: 127 mg/dL — ABNORMAL HIGH (ref 70–99)

## 2024-07-08 LAB — ECHOCARDIOGRAM COMPLETE
Area-P 1/2: 3.46 cm2
S' Lateral: 2.4 cm

## 2024-07-08 LAB — BRAIN NATRIURETIC PEPTIDE: B Natriuretic Peptide: 106.6 pg/mL — ABNORMAL HIGH (ref 0.0–100.0)

## 2024-07-08 LAB — HEPARIN LEVEL (UNFRACTIONATED): Heparin Unfractionated: 0.18 [IU]/mL — ABNORMAL LOW (ref 0.30–0.70)

## 2024-07-08 MED ORDER — HEPARIN (PORCINE) 25000 UT/250ML-% IV SOLN
1500.0000 [IU]/h | INTRAVENOUS | Status: DC
  Administered 2024-07-08: 1200 [IU]/h via INTRAVENOUS
  Administered 2024-07-09: 1500 [IU]/h via INTRAVENOUS
  Filled 2024-07-08 (×2): qty 250

## 2024-07-08 MED ORDER — MORPHINE SULFATE (PF) 2 MG/ML IV SOLN
1.0000 mg | INTRAVENOUS | Status: DC | PRN
  Administered 2024-07-08: 1 mg via INTRAVENOUS
  Filled 2024-07-08: qty 1

## 2024-07-08 MED ORDER — HYDROCORTISONE 1 % EX CREA
TOPICAL_CREAM | Freq: Two times a day (BID) | CUTANEOUS | Status: DC
  Filled 2024-07-08: qty 28

## 2024-07-08 MED ORDER — LOSARTAN POTASSIUM 50 MG PO TABS
100.0000 mg | ORAL_TABLET | Freq: Every day | ORAL | Status: DC
  Administered 2024-07-09 – 2024-07-10 (×2): 100 mg via ORAL
  Filled 2024-07-08 (×2): qty 2

## 2024-07-08 MED ORDER — LETROZOLE 2.5 MG PO TABS
2.5000 mg | ORAL_TABLET | Freq: Every day | ORAL | Status: DC
  Administered 2024-07-09 – 2024-07-10 (×2): 2.5 mg via ORAL
  Filled 2024-07-08 (×2): qty 1

## 2024-07-08 MED ORDER — TRAMADOL HCL 50 MG PO TABS
50.0000 mg | ORAL_TABLET | Freq: Four times a day (QID) | ORAL | Status: DC | PRN
  Administered 2024-07-08 – 2024-07-09 (×4): 50 mg via ORAL
  Filled 2024-07-08 (×4): qty 1

## 2024-07-08 MED ORDER — ALBUTEROL SULFATE (2.5 MG/3ML) 0.083% IN NEBU: 2.5000 mg | INHALATION_SOLUTION | RESPIRATORY_TRACT | Status: DC | PRN

## 2024-07-08 MED ORDER — ACETAMINOPHEN 650 MG RE SUPP: 650.0000 mg | Freq: Four times a day (QID) | RECTAL | Status: DC | PRN

## 2024-07-08 MED ORDER — INSULIN ASPART 100 UNIT/ML IJ SOLN
0.0000 [IU] | Freq: Every day | INTRAMUSCULAR | Status: DC
  Filled 2024-07-08: qty 0.05

## 2024-07-08 MED ORDER — CHLORHEXIDINE GLUCONATE CLOTH 2 % EX PADS
6.0000 | MEDICATED_PAD | Freq: Every day | CUTANEOUS | Status: DC
  Administered 2024-07-08 – 2024-07-10 (×3): 6 via TOPICAL

## 2024-07-08 MED ORDER — ACETAMINOPHEN 325 MG PO TABS
650.0000 mg | ORAL_TABLET | Freq: Four times a day (QID) | ORAL | Status: DC | PRN
  Filled 2024-07-08: qty 2

## 2024-07-08 MED ORDER — FUROSEMIDE 10 MG/ML IJ SOLN
40.0000 mg | Freq: Once | INTRAMUSCULAR | Status: AC
  Administered 2024-07-08: 40 mg via INTRAVENOUS
  Filled 2024-07-08: qty 4

## 2024-07-08 MED ORDER — MAGNESIUM CITRATE PO SOLN
1.0000 | Freq: Once | ORAL | Status: AC
  Administered 2024-07-08: 1 via ORAL
  Filled 2024-07-08: qty 296

## 2024-07-08 MED ORDER — POLYETHYLENE GLYCOL 3350 17 G PO PACK
17.0000 g | PACK | Freq: Every day | ORAL | Status: DC
  Administered 2024-07-08: 17 g via ORAL
  Filled 2024-07-08 (×2): qty 1

## 2024-07-08 MED ORDER — HEPARIN BOLUS VIA INFUSION
2500.0000 [IU] | Freq: Once | INTRAVENOUS | Status: AC
  Administered 2024-07-08: 2500 [IU] via INTRAVENOUS
  Filled 2024-07-08: qty 2500

## 2024-07-08 MED ORDER — INSULIN ASPART 100 UNIT/ML IJ SOLN
0.0000 [IU] | Freq: Three times a day (TID) | INTRAMUSCULAR | Status: DC
  Administered 2024-07-09: 3 [IU] via SUBCUTANEOUS
  Filled 2024-07-08: qty 0.15

## 2024-07-08 MED ORDER — ORAL CARE MOUTH RINSE: 15.0000 mL | OROMUCOSAL | Status: DC | PRN

## 2024-07-08 MED ORDER — ONDANSETRON HCL 4 MG/2ML IJ SOLN
4.0000 mg | Freq: Four times a day (QID) | INTRAMUSCULAR | Status: DC | PRN
  Filled 2024-07-08: qty 2

## 2024-07-08 MED ORDER — SENNOSIDES-DOCUSATE SODIUM 8.6-50 MG PO TABS
2.0000 | ORAL_TABLET | Freq: Two times a day (BID) | ORAL | Status: DC
  Administered 2024-07-08 – 2024-07-10 (×2): 2 via ORAL
  Filled 2024-07-08 (×3): qty 2

## 2024-07-08 MED ORDER — IOHEXOL 350 MG/ML SOLN
100.0000 mL | Freq: Once | INTRAVENOUS | Status: AC | PRN
  Administered 2024-07-08: 100 mL via INTRAVENOUS

## 2024-07-08 MED ORDER — TRAZODONE HCL 50 MG PO TABS: 25.0000 mg | ORAL_TABLET | Freq: Every evening | ORAL | Status: DC | PRN

## 2024-07-08 MED ORDER — LEVOTHYROXINE SODIUM 75 MCG PO TABS
150.0000 ug | ORAL_TABLET | Freq: Every day | ORAL | Status: DC
  Administered 2024-07-09 – 2024-07-10 (×2): 150 ug via ORAL
  Filled 2024-07-08: qty 2
  Filled 2024-07-08: qty 1

## 2024-07-08 MED ORDER — ASPIRIN 325 MG PO TABS
325.0000 mg | ORAL_TABLET | Freq: Once | ORAL | Status: AC
  Administered 2024-07-08: 325 mg via ORAL
  Filled 2024-07-08: qty 1

## 2024-07-08 MED ORDER — ONDANSETRON HCL 4 MG PO TABS: 4.0000 mg | ORAL_TABLET | Freq: Four times a day (QID) | ORAL | Status: DC | PRN

## 2024-07-08 MED ORDER — HEPARIN BOLUS VIA INFUSION
4000.0000 [IU] | Freq: Once | INTRAVENOUS | Status: AC
  Administered 2024-07-08: 4000 [IU] via INTRAVENOUS
  Filled 2024-07-08: qty 4000

## 2024-07-08 MED ORDER — PERFLUTREN LIPID MICROSPHERE
1.0000 mL | INTRAVENOUS | Status: AC | PRN
  Administered 2024-07-08: 3 mL via INTRAVENOUS

## 2024-07-08 NOTE — Progress Notes (Signed)
 PHARMACY - ANTICOAGULATION CONSULT NOTE  Pharmacy Consult for heparin  Indication: chest pain/ACS  Allergies  Allergen Reactions   Wound Dressing Adhesive Rash    Adhesive tape causes rash, can use papee tape   Tape Rash and Other (See Comments)    Blistery rash DERMABOND    Patient Measurements: Height: 5' 6 (167.6 cm) Weight: 118.8 kg (261 lb 14.5 oz) IBW/kg (Calculated) : 59.3 HEPARIN  DW (KG): 87.5  Vital Signs: Temp: 98.3 F (36.8 C) (08/04 1918) Temp Source: Oral (08/04 1918) BP: 158/66 (08/04 1900) Pulse Rate: 82 (08/04 1900)  Labs: Recent Labs    07/08/24 0606 07/08/24 1220 07/08/24 1412 07/08/24 1625 07/08/24 2106  HGB 12.5  --   --   --   --   HCT 39.7  --   --   --   --   PLT 144*  --   --   --   --   HEPARINUNFRC  --   --   --   --  0.18*  CREATININE 0.61  --   --   --   --   TROPONINIHS 355* 262* 222* 213*  --     Estimated Creatinine Clearance: 77.3 mL/min (by C-G formula based on SCr of 0.61 mg/dL).   Medical History: Past Medical History:  Diagnosis Date   Acquired solitary kidney    right side 2014   Arthritis    rt knee rt shoulder   Bronchitis 05/08/2023   Cancer (HCC)    renal cell carcinoma- left kidney removed and left breast cancer   Complication of anesthesia    slow to wake, hard to take a deep breath with 1 surgery several yrs ago   Foley catheter in place    Frequency of urination    Heart murmur    per told by pcp approx. 02/ 2022 heard a very faint murmur, told did need work-up done at this time   History of COVID-19 2021   all symptoms resolved   History of Hashimoto thyroiditis    s/p  total thyroidectomy 1995   History of hypercalcemia    s/p  parathryoidectomy 07/ 2021   History of kidney stones    History of renal cell carcinoma 2014   s/p  left nephrectomy, pt stated no other treatment   Hypertension    followed by pcp   Hypothyroidism, postsurgical 1995   followed by pcp in [place], moved from [place]  01/ 2021    OSA on CPAP    Thickened endometrium    UTI (urinary tract infection)    finished keflex  08-12-2022   Wears glasses     Medications: No anticoagulants PTA  Assessment: Pt is a 53 yoF who recently underwent reverse total shoulder arthroplasty on 07/05/24. No post-op anticoagulants prescribed on discharge. Pt presented to ED on 8/4 with SOB, requiring oxygen. Troponin 355. Pharmacy consulted to dose heparin  for ACS.  Today, 07/08/24 CBC: Hgb WNL, Plt slightly low Troponin elevated (355), ASA 325 mg PO once given CTA ordered  2106 HL 0.18 subtherapeutic on 1200 units/hr No bleeding reported  Goal of Therapy:  Heparin  level 0.3-0.7 units/ml Monitor platelets by anticoagulation protocol: Yes   Plan:  Heparin  bolus of 2500 units x 1 Increase heparin  drip to 1500 units/hr Check 8 hour heparin  level CBC, heparin  level daily Monitor for signs of bleeding  Leeroy Mace RPh 07/08/2024, 9:38 PM

## 2024-07-08 NOTE — Progress Notes (Signed)
 PHARMACY - ANTICOAGULATION CONSULT NOTE  Pharmacy Consult for heparin  Indication: chest pain/ACS  Allergies  Allergen Reactions   Wound Dressing Adhesive Rash    Adhesive tape causes rash, can use papee tape   Tape Rash and Other (See Comments)    Blistery rash DERMABOND    Patient Measurements:    Vital Signs: Temp: 99.2 F (37.3 C) (08/04 0624) Temp Source: Oral (08/04 0624) BP: 138/83 (08/04 0624) Pulse Rate: 94 (08/04 0624)  Labs: Recent Labs    07/08/24 0606  HGB 12.5  HCT 39.7  PLT 144*  CREATININE 0.61  TROPONINIHS 355*    Estimated Creatinine Clearance: 76.4 mL/min (by C-G formula based on SCr of 0.61 mg/dL).   Medical History: Past Medical History:  Diagnosis Date   Acquired solitary kidney    right side 2014   Arthritis    rt knee rt shoulder   Bronchitis 05/08/2023   Cancer (HCC)    renal cell carcinoma- left kidney removed and left breast cancer   Complication of anesthesia    slow to wake, hard to take a deep breath with 1 surgery several yrs ago   Foley catheter in place    Frequency of urination    Heart murmur    per told by pcp approx. 02/ 2022 heard a very faint murmur, told did need work-up done at this time   History of COVID-19 2021   all symptoms resolved   History of Hashimoto thyroiditis    s/p  total thyroidectomy 1995   History of hypercalcemia    s/p  parathryoidectomy 07/ 2021   History of kidney stones    History of renal cell carcinoma 2014   s/p  left nephrectomy, pt stated no other treatment   Hypertension    followed by pcp   Hypothyroidism, postsurgical 1995   followed by pcp in [place], moved from [place]  01/ 2021   OSA on CPAP    Thickened endometrium    UTI (urinary tract infection)    finished keflex  08-12-2022   Wears glasses     Medications: No anticoagulants PTA  Assessment: Pt is a 23 yoF who recently underwent reverse total shoulder arthroplasty on 07/05/24. No post-op anticoagulants prescribed on  discharge. Pt presented to ED on 8/4 with SOB, requiring oxygen. Troponin 355. Pharmacy consulted to dose heparin  for ACS.  Today, 07/08/24 CBC: Hgb WNL, Plt slightly low Troponin elevated (355), ASA 325 mg PO once given CTA ordered  Goal of Therapy:  Heparin  level 0.3-0.7 units/ml Monitor platelets by anticoagulation protocol: Yes   Plan:  Heparin  bolus of 4000 units IV once Heparin  infusion of 1200 units/hr Check 8 hour heparin  level CBC, heparin  level daily Monitor for signs of bleeding  Ronal CHRISTELLA Rav, PharmD 07/08/2024,8:18 AM

## 2024-07-08 NOTE — ED Triage Notes (Signed)
 Pt BIB [facility] from home. Pt c/o SOB. Pt had shoulder surgery 3 days ago. Pt c/o right shoulder pain. Pt lungs sounds clear.  Pt 88% on RA pt placed on 5L [place] and up to 99%  101HR 19RR 133/81 130CBG

## 2024-07-08 NOTE — Consult Note (Signed)
 Cardiology Consultation   Patient ID: Brooke [place] MRN: [id number]; DOB: [birth year]  Admit date: 07/08/2024 Date of Consult: 07/08/2024  PCP:  Okey Carlin Redbird, MD   [facility] Providers Cardiologist: New to [facility] - Dr. Mona   Patient Profile: Brooke [place] is a 78 y.o. female with a hx of former tobacco use (quit in [place], Brooke smoking 10 years at 1.5 pack/day), Brooke [place], Brooke [place], Brooke [place], Brooke [place], Brooke [place] the evaluation of dyspnea and elevated troponin at the request of Dr. Zella.  Brooke of Present Illness: Brooke [place] is a 78 year old female with past medical Brooke of former tobacco use (quit in [place], Brooke smoking 10 years at 1.5 pack/day), Brooke [place], Brooke [place], Brooke [place], Brooke [place], Brooke and dilated thoracic aorta.  Patient was seen by Dr. Vinie Loop of [facility] cardiology service on 03/21/2024 due to family Brooke of CAD involving both parents and 2 of her brothers.  Subsequent echocardiogram obtained on 03/24/2024 showed EF 55 to 60%, no regional wall motion abnormality, mild posterior mitral annular calcification, mildly dilated ascending aorta measuring at 4.1 cm.  No significant valve issue was noted.  Patient underwent right shoulder surgery by Dr. Kay on 07/05/2024.  Prior to discharge, she had mild dyspnea however was feeling better on oxygen.  Brooke she was discharged, she had progressive worsening dyspnea and orthopnea.  She says she was sleeping in the recliner Duncan the past few days Brooke her shoulder surgery, however every time she tried to lay her recliner down, she will get more dyspneic.  She denies any chest pain.  She ultimately sought medical attention at [facility] this morning on 07/08/2024.  Initial troponin was 355.  BNP elevated at 106.6.  Brooke  function and electrolyte normal.  Viral panel negative Duncan COVID, RSV and influenza.  Chest x-ray showed hazy opacity in the lung bases bilaterally.  CTA of the chest showed no PE, no masses or consolidation, patchy area of atelectasis throughout bilateral lungs.  Cardiology service consulted [place] elevated troponin.  Second troponin is currently pending.  Echocardiogram has been done but not read.  At the time my interview, O2 saturation 90% on 4 L/min nasal cannula.   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 02/ 2022 heard a very faint murmur, told did need work-up done at this time   Brooke of COVID-19 2021   all symptoms resolved   Brooke of Hashimoto thyroiditis    s/p  total thyroidectomy 1995   Brooke of hypercalcemia    s/p  parathryoidectomy 07/ 2021   Brooke of kidney stones    Brooke of Brooke cell carcinoma 2014   s/p  left [place], pt stated no other treatment   Brooke [place]    followed by pcp   Brooke, postsurgical 1995   followed by pcp in [place], moved from [place]  01/ 2021  OSA on CPAP    Thickened endometrium    UTI (urinary tract infection)    finished keflex  08-12-2022   Wears glasses     Past Surgical Brooke:  Procedure Laterality Date   BREAST BIOPSY Left 08/24/2021   BREAST LUMPECTOMY WITH RADIOACTIVE SEED LOCALIZATION Left 07/14/2022   Procedure: LEFT BREAST LUMPECTOMY WITH RADIOACTIVE SEED LOCALIZATION;  Surgeon: Ebbie Cough, MD;  Location: [facility];  Service: General;  Laterality: Left;   COLON SURGERY     10/2022 at [facility]    CYSTOSCOPY  08/04/2022   in dr winter office   CYSTOSCOPY WITH BIOPSY N/A 09/02/2022   Procedure: CYSTOSCOPY WITH BLADDER BIOPSY;  Surgeon: Devere Lonni Righter, MD;  Location: [facility];  Service: Urology;  Laterality: N/A;  ONLY NEEDS 30 MIN   HYSTEROSCOPY WITH D & C N/A 04/08/2021   Procedure: DILATATION AND CURETTAGE /HYSTEROSCOPY, EXCISION OF VAGINAL LESION;  Surgeon: Darcel Pool, MD;  Location: [facility];  Service: Gynecology;  Laterality: N/A;   LAPAROSCOPIC CHOLECYSTECTOMY  2005   [place] Left 2014   PARATHYROIDECTOMY  07/ 2021  in [place]    REVERSE SHOULDER ARTHROPLASTY Left 06/16/2023   Procedure: REVERSE SHOULDER ARTHROPLASTY;  Surgeon: Kay Kemps, MD;  Location: [facility];  Service: Orthopedics;  Laterality: Left;   TONSILLECTOMY  age 77   TOTAL HIP ARTHROPLASTY Bilateral left 2015;  right 2009   TOTAL KNEE ARTHROPLASTY Left 2014   TOTAL THYROIDECTOMY Bilateral 1995   TRIGGER FINGER RELEASE Right 01/24/2024   Procedure: RELEASE TRIGGER FINGER/A-1 PULLEY MIDDLE AND RING;  Surgeon: Romona Harari, MD;  Location: [facility];  Service: Orthopedics;  Laterality: Right;  local   WISDOM TOOTH EXTRACTION Bilateral      Home Medications:  Prior to Admission medications   Medication Sig Start Date End Date Taking? Authorizing Provider  acetaminophen  (TYLENOL ) 500 MG tablet Take 2 tablets (1,000 mg total) by mouth every 6 (six) hours as needed. 04/08/21   Darcel Pool, MD  albuterol  (VENTOLIN  HFA) 108 (90 Base) MCG/ACT inhaler Inhale 2 puffs into the lungs every 4 (four) hours as needed [place] shortness of breath or wheezing. 05/09/22   [provider]  Ascorbic Acid  (VITAMIN C  PO) Take 1 tablet by mouth daily.    [provider]  Cholecalciferol  (VITAMIN D3 SUPER STRENGTH) 50 MCG (2000 UT) CAPS Take 2,000 Units by mouth daily.    [provider]  clotrimazole  (LOTRIMIN ) 1 % cream Apply 1 application   topically 2 (two) times daily as needed (irritation).    [provider]  CRANBERRY PO Take 2 capsules by mouth daily.    [provider]  D-MANNOSE PO Take 4 capsules by mouth daily.    [provider]  erythromycin  ophthalmic ointment Place 1 application  into both eyes daily as needed (blepharitis).    [provider]  hydrocortisone  2.5 % cream Apply 1 Application topically 2 (two) times daily as needed (irritation).    [provider]  letrozole  (FEMARA ) 2.5 MG tablet Take 1 tablet (2.5 mg total) by mouth daily. 04/03/24   Gudena, Vinay, MD  levothyroxine  (SYNTHROID ) 150 MCG tablet Take 150 mcg by mouth daily before breakfast.    [provider]  losartan  (COZAAR ) 100 MG tablet Take 100 mg by mouth daily.    [provider]  ondansetron  (ZOFRAN ) 4 MG tablet Take 1 tablet (4 mg total) by mouth every 8 (eight) hours as needed [place] nausea, vomiting  or refractory nausea / vomiting. 07/05/24 07/05/25  Kay Kemps, MD  oxybutynin  (DITROPAN ) 5 MG tablet Take 1 tablet (5 mg total) by mouth every 8 (eight) hours as needed [place] bladder spasms. 09/02/22   Devere Lonni Righter, MD  Probiotic Product (PROBIOTIC DAILY PO) Take 1 capsule by mouth daily.    [provider]  simvastatin  (ZOCOR ) 10 MG tablet Take 10 mg by mouth at bedtime.    [provider]  tamsulosin  (FLOMAX ) 0.4 MG CAPS capsule Take 1 capsule (0.4 mg total) by mouth daily. Patient not taking: Reported on 06/20/2024 03/10/22   Gudena, Vinay, MD  traMADol  (ULTRAM ) 50 MG tablet Take 1 tablet (50 mg total) by mouth every 6 (six) hours as needed. 07/05/24 07/05/25  Kay Kemps, MD  vitamin B-12 (CYANOCOBALAMIN ) 1000 MCG tablet Take 1,000 mcg by mouth daily.    [provider]    Scheduled Meds:  heparin   4,000 Units Intravenous Once   insulin  aspart  0-15 Units Subcutaneous TID WC   insulin  aspart  0-5 Units Subcutaneous QHS   letrozole   2.5 mg Oral Daily    [START ON 07/09/2024] levothyroxine   150 mcg Oral QAC breakfast   losartan   100 mg Oral Daily   magnesium  citrate  1 Bottle Oral Once   polyethylene glycol  17 g Oral Daily   senna-docusate  2 tablet Oral BID   Continuous Infusions:  heparin      PRN Meds: acetaminophen  **OR** acetaminophen , albuterol , morphine  injection, ondansetron  **OR** ondansetron  (ZOFRAN ) IV, perflutren  lipid microspheres (DEFINITY ) IV suspension, traMADol , traZODone   Allergies:    Allergies  Allergen Reactions   Wound Dressing Adhesive Rash    Adhesive tape causes rash, can use papee tape   Tape Rash and Other (See Comments)    Blistery rash DERMABOND    Social Brooke:   Social Brooke   Socioeconomic Brooke   Marital status: Widowed    Spouse name: Not on file   Number of children: Not on file   Years of education: Not on file   Highest education level: Not on file  Occupational Brooke   Not on file  Tobacco Use   Smoking status: Former    Current packs/day: 0.00    Average packs/day: 1.5 packs/day [place] 12.0 years (18.0 ttl pk-yrs)    Types: Cigarettes    Start date: 04/02/1962    Quit date: 04/02/1974    Years since quitting: 50.3   Smokeless tobacco: Never  Vaping Use   Vaping status: Never Used  Substance and Sexual Activity   Alcohol use: Never   Drug use: Never   Sexual activity: Not Currently    Birth control/protection: Post-menopausal  Other Topics Concern   Not on file  Social Brooke Narrative   Not on file   Social Drivers of Health   Financial Resource Strain: Low Risk  (03/20/2024)   Received from [facility]   Overall Financial Resource Strain (CARDIA)    Difficulty of Paying Living Expenses: Not very hard  Food Insecurity: No Food Insecurity (07/05/2024)   Hunger Vital Sign    Worried About Running Out of Food in the Last Year: Never true    Ran Out of Food in the Last Year: Never true  Transportation Needs: No Transportation Needs (07/05/2024)   PRAPARE -  Administrator, Civil Service (Medical): No    Lack of Transportation (Non-Medical): No  Physical Activity: Unknown (03/20/2024)   Received from [facility]   Exercise Vital Sign  On average, how many days per week do you engage in moderate to strenuous exercise (like a brisk walk)?: 0 days    Minutes of Exercise per Session: Not on file  Stress: Stress Concern Present (03/20/2024)   Received from [facility] - Occupational Stress Questionnaire    Feeling of Stress : To some extent  Social Connections: Somewhat Isolated (03/20/2024)   Received from [facility]   Social Network    How would you rate your social network (family, work, friends)?: Restricted participation with some degree of social isolation  Intimate Partner Violence: Not At Risk (07/05/2024)   Humiliation, Afraid, Rape, and Kick questionnaire    Fear of Current or Ex-Partner: No    Emotionally Abused: No    Physically Abused: No    Sexually Abused: No    Family Brooke:    Family Brooke  Problem Relation Age of Onset   Heart attack Mother    Heart attack Father    Melanoma Father    Prostate [place] Father    Heart attack Brother      ROS:  Please see the Brooke of present illness.   All other ROS reviewed and negative.     Physical Exam/Data: Vitals:   07/08/24 0624  BP: 138/83  Pulse: 94  Resp: 17  Temp: 99.2 F (37.3 C)  TempSrc: Oral  SpO2: 96%   No intake or output data in the 24 hours ending 07/08/24 1132    07/05/2024   12:37 PM 06/28/2024   10:36 AM 01/24/2024    7:08 AM  Last 3 Weights  Weight (lbs) 257 lb 252 lb 271 lb 9.7 oz  Weight (kg) 116.574 kg 114.306 kg 123.2 kg     There is no height or weight on file to calculate BMI.  General:  Well nourished, well developed, in no acute distress HEENT: normal Neck: no JVD Vascular: No carotid bruits; Distal pulses 2+ bilaterally Cardiac:  normal S1, S2; RRR; no murmur  Lungs:   clear to auscultation bilaterally, no wheezing, rhonchi or rales  Abd: soft, nontender, no hepatomegaly  Ext: no edema Musculoskeletal:  No deformities, BUE and BLE strength normal and equal Skin: warm and dry  Neuro:  CNs 2-12 intact, no focal abnormalities noted Psych:  Normal affect   EKG:  The EKG was personally reviewed and demonstrates: Sinus rhythm, poor R wave progression in anterior leads, T wave inversion in lead III but not yet adjacent lead. Telemetry:  Telemetry was personally reviewed and demonstrates: Sinus rhythm, no significant ventricular ectopy.  Relevant CV Studies:  Echo 04/01/2024 Left Ventricle: Systolic function is normal. EF: 55-60%.    Left Ventricle: Wall thickness is normal.    Left Ventricle: Left ventricle size is normal.    Left Ventricle: No regional wall motion abnormalities noted.    Left Ventricle: Doppler parameters indicate normal diastolic function.    Mitral Valve: There is mild posterior annular calcification.    Aorta: The aortic root is normal in size. The ascending aorta is mildly  dilated 4.1 cm   Laboratory Data: High Sensitivity Troponin:   Recent Labs  Lab 07/08/24 0606  TROPONINIHS 355*     Chemistry Recent Labs  Lab 07/08/24 0606  NA 135  K 4.2  CL 99  CO2 26  GLUCOSE 114*  BUN 11  CREATININE 0.61  CALCIUM 8.8*  MG 1.8  GFRNONAA >60  ANIONGAP 10    No results [place] input(s):  PROT, ALBUMIN, AST, ALT, ALKPHOS, BILITOT in the last 168 hours. Lipids No results [place] input(s): CHOL, TRIG, HDL, LABVLDL, LDLCALC, CHOLHDL in the last 168 hours.  Hematology Recent Labs  Lab 07/08/24 0606  WBC 10.0  RBC 4.47  HGB 12.5  HCT 39.7  MCV 88.8  MCH 28.0  MCHC 31.5  RDW 16.1*  PLT 144*   Thyroid No results [place] input(s): TSH, FREET4 in the last 168 hours.  BNP Recent Labs  Lab 07/08/24 0606  BNP 106.6*    DDimer No results [place] input(s): DDIMER in the last 168 hours.  Radiology/Studies:  CT  Angio Chest PE W and/or Wo Contrast Result Date: 07/08/2024 CLINICAL DATA:  Pulmonary embolism (PE) suspected, high prob. Recent shoulder surgery. Shortness of breath. EXAM: CT ANGIOGRAPHY CHEST WITH CONTRAST TECHNIQUE: Multidetector CT imaging of the chest was performed using the standard protocol during bolus administration of intravenous contrast. Multiplanar CT image reconstructions and MIPs were obtained to evaluate the vascular anatomy. RADIATION DOSE REDUCTION: This exam was performed according to the departmental dose-optimization program which includes automated exposure control, adjustment of the mA and/or kV according to patient size and/or use of iterative reconstruction technique. CONTRAST:  OMNIPAQUE  IOHEXOL  350 MG/ML SOLN COMPARISON:  None Available. FINDINGS: Cardiovascular: Evaluation of pulmonary embolism beyond the segmental branches is limited due to suboptimal contrast-enhancement and patient's respiratory motion. There is no embolism to the segmental pulmonary artery level. Mild cardiomegaly. No pericardial effusion. No aortic aneurysm. Mediastinum/Nodes: Visualized thyroid gland appears grossly unremarkable. No solid / cystic mediastinal masses. The esophagus is nondistended precluding optimal assessment. No axillary, mediastinal or hilar lymphadenopathy by size criteria. Lungs/Pleura: The central tracheo-bronchial tree is patent. There is mild, smooth, circumferential thickening of the segmental and subsegmental bronchial walls, throughout bilateral lungs, which is nonspecific. Findings are most commonly seen with bronchitis or reactive airway disease, such as asthma. There is mosaic attenuation of lungs, consistent with heterogeneous air trapping related to small airways disease. There are patchy areas of linear, plate-like atelectasis throughout bilateral lungs. No mass or consolidation. No pleural effusion or pneumothorax. No suspicious lung nodules. Upper Abdomen: Surgically absent  gallbladder. There are scattered diverticula in the splenic flexure of colon without diverticulitis. There is probable tiny sliding hiatal hernia. Remaining visualized upper abdominal viscera within normal limits. Musculoskeletal: Patient is status post recent right reverse shoulder arthroplasty. There is air in the surrounding soft tissue, related to surgery. The visualized soft tissues of the chest wall are otherwise grossly unremarkable. No suspicious osseous lesions. There are mild multilevel degenerative changes in the visualized spine. Review of the MIP images confirms the above findings. IMPRESSION: 1. No embolism to the segmental pulmonary artery level. 2. No lung mass, consolidation, pleural effusion or pneumothorax. Patchy areas of atelectasis throughout bilateral lungs. 3. Multiple other nonacute observations, as described above. Electronically Signed   By: Ree Molt M.D.   On: 07/08/2024 08:35   DG Chest Port 1 View Result Date: 07/08/2024 EXAM: 1 VIEW XRAY OF THE CHEST 07/08/2024 06:31:00 AM COMPARISON: PA and lateral radiographs of the chest dated 05/26/2022. CLINICAL Brooke: Increasing shortness of breath Duncan 24 hours, shoulder replacement 08/01. FINDINGS: LUNGS AND PLEURA: Hazy coarse opacities present within the lung bases bilaterally, likely reflecting a combination of alveolar and interstitial edema. No pleural effusion. No pneumothorax. HEART AND MEDIASTINUM: No acute abnormality of the cardiac and mediastinal silhouettes. BONES AND SOFT TISSUES: The patient is status post bilateral reverse shoulder arthroplasties. No acute osseous abnormality. ARTIFACTS: There is  a circular artifact overlying the right lower lung zone. IMPRESSION: 1. Hazy opacities in the lung bases bilaterally, likely reflecting a combination of alveolar and interstitial edema. 2. Circular artifact overlying the right lower lung zone. Electronically signed by: evalene coho 07/08/2024 07:10 AM EDT RP Workstation:  [id number]   DG Shoulder Right Port Result Date: 07/05/2024 CLINICAL DATA:  Shoulder replacement EXAM: RIGHT SHOULDER - 1 VIEW COMPARISON:  None Available. FINDINGS: Status post reverse right shoulder replacement with normal alignment. Gas in the soft tissues consistent with recent surgery. Mild AC joint degenerative change IMPRESSION: Status post reverse right shoulder replacement with expected postsurgical change. Electronically Signed   By: Luke Bun M.D.   On: 07/05/2024 17:32     Assessment and Plan: Elevated troponin: First troponin 355, pending second troponin and echocardiogram.  Denies any recent chest pain.  Underwent right shoulder surgery 3 days ago and was complaining of shortness of breath before she left the hospital which has progressed over the past 3 days.  She also admits to have orthopnea.  Suspected patient is mildly volume overloaded, when combined with the recent surgery, likely contributed to the elevation of the troponin.  Will give 40 mg IV Lasix .  If second troponin significant trend up or her EF is low, we will consider cardiac catheterization.  Dyspnea: Suspect patient is volume overloaded.  Will give 40 mg IV Lasix  x 1.  Recent right shoulder surgery  Brooke [place]: On losartan  100 mg daily at home.  Brooke of Brooke [place] status post Duncan: Solitary kidney  Mildly dilated thoracic aorta: Measuring at 4.1 cm.  Yearly repeat CT image of the chest.   Risk Assessment/Risk Scores:    TIMI Risk Score Duncan Unstable Angina or Non-ST Elevation MI:   The patient's TIMI risk score is 3, which indicates a 13% risk of all cause mortality, new or recurrent myocardial infarction or need Duncan urgent revascularization in the next 14 days.  New [place]  Heart Association (NYHA) Functional Class NYHA Class II       Dun[place] questions or updates, please contact [facility] Please consult [email or web address] [place] contact info under    Signed, Eathen [place], [place]  07/08/2024  11:32 AM

## 2024-07-08 NOTE — ED Provider Notes (Signed)
 [place] EMERGENCY DEPARTMENT AT [facility] Provider Note   CSN: [id number] Arrival date & time: 07/08/24  9394     Patient presents with: Shortness of Breath   Brooke Duncan is a 78 y.o. female.   78 year old female who recently underwent right shoulder surgery on Friday presents today for concern of shortness of breath that started Saturday.  She denies any chest pain.  She states Saturday she noticed some shortness of breath which persisted through Sunday.  She states she had a pulse ox at home when she checked and had O2 saturations of around 75.  She states her shortness of breath worsened this morning so she came in for evaluation.  Denies any other complaints.  Endorses some cough but believes that this is irritation from the intubation during surgery.  Denies any mucus production.  She denies history of CAD, CHF, COPD, or asthma.  She states that she does find it difficult to lay flat.  Does occasionally have lower extremity swelling.  The history is provided by the patient. No language interpreter was used.       Prior to Admission medications   Medication Sig Start Date End Date Taking? Authorizing Provider  acetaminophen  (TYLENOL ) 500 MG tablet Take 2 tablets (1,000 mg total) by mouth every 6 (six) hours as needed. 04/08/21   Darcel Pool, MD  albuterol  (VENTOLIN  HFA) 108 (90 Base) MCG/ACT inhaler Inhale 2 puffs into the lungs every 4 (four) hours as needed for shortness of breath or wheezing. 05/09/22   [provider]  Ascorbic Acid  (VITAMIN C  PO) Take 1 tablet by mouth daily.    [provider]  Cholecalciferol  (VITAMIN D3 SUPER STRENGTH) 50 MCG (2000 UT) CAPS Take 2,000 Units by mouth daily.    [provider]  clotrimazole  (LOTRIMIN ) 1 % cream Apply 1 application  topically 2 (two) times daily as needed (irritation).    [provider]  CRANBERRY PO Take 2 capsules by mouth daily.    [provider]  D-MANNOSE PO  Take 4 capsules by mouth daily.    [provider]  erythromycin  ophthalmic ointment Place 1 application  into both eyes daily as needed (blepharitis).    [provider]  hydrocortisone  2.5 % cream Apply 1 Application topically 2 (two) times daily as needed (irritation).    [provider]  letrozole  (FEMARA ) 2.5 MG tablet Take 1 tablet (2.5 mg total) by mouth daily. 04/03/24   Gudena, Vinay, MD  levothyroxine  (SYNTHROID ) 150 MCG tablet Take 150 mcg by mouth daily before breakfast.    [provider]  losartan  (COZAAR ) 100 MG tablet Take 100 mg by mouth daily.    [provider]  methocarbamol  (ROBAXIN ) 500 MG tablet Take 1 tablet (500 mg total) by mouth every 8 (eight) hours as needed for muscle spasms. Patient not taking: Reported on 06/20/2024 06/16/23   Kay Kemps, MD  NON FORMULARY Pt uses a cpap nightly    [provider]  ondansetron  (ZOFRAN ) 4 MG tablet Take 1 tablet (4 mg total) by mouth every 8 (eight) hours as needed for nausea, vomiting or refractory nausea / vomiting. 07/05/24 07/05/25  Kay Kemps, MD  oxybutynin  (DITROPAN ) 5 MG tablet Take 1 tablet (5 mg total) by mouth every 8 (eight) hours as needed for bladder spasms. 09/02/22   Devere Lonni Righter, MD  Probiotic Product (PROBIOTIC DAILY PO) Take 1 capsule by mouth daily.    [provider]  simvastatin  (ZOCOR ) 10  MG tablet Take 10 mg by mouth at bedtime.    [provider]  tamsulosin  (FLOMAX ) 0.4 MG CAPS capsule Take 1 capsule (0.4 mg total) by mouth daily. Patient not taking: Reported on 06/20/2024 03/10/22   Gudena, Vinay, MD  traMADol  (ULTRAM ) 50 MG tablet Take 1 tablet (50 mg total) by mouth every 6 (six) hours as needed. 07/05/24 07/05/25  Kay Kemps, MD  vitamin B-12 (CYANOCOBALAMIN ) 1000 MCG tablet Take 1,000 mcg by mouth daily.    [provider]    Allergies: Wound dressing adhesive and Tape    Review of Systems  Constitutional:   Negative for chills and fever.  Respiratory:  Positive for cough and shortness of breath. Negative for wheezing.   Cardiovascular:  Positive for leg swelling. Negative for chest pain.  All other systems reviewed and are negative.   Updated Vital Signs BP 138/83   Pulse 94   Temp 99.2 F (37.3 C) (Oral)   Resp 17   SpO2 96%   Physical Exam Vitals and nursing note reviewed.  Constitutional:      General: She is not in acute distress.    Appearance: Normal appearance. She is not ill-appearing.  HENT:     Head: Normocephalic and atraumatic.     Nose: Nose normal.  Eyes:     Conjunctiva/sclera: Conjunctivae normal.  Cardiovascular:     Rate and Rhythm: Normal rate and regular rhythm.  Pulmonary:     Effort: Pulmonary effort is normal. No respiratory distress.     Breath sounds: Normal breath sounds. No wheezing.  Musculoskeletal:        General: No deformity. Normal range of motion.     Cervical back: Normal range of motion.     Right lower leg: Edema (Trace pitting edema bilaterally) present.     Left lower leg: Edema (Trace pitting edema bilaterally) present.  Skin:    Findings: No rash.  Neurological:     Mental Status: She is alert.     (all labs ordered are listed, but only abnormal results are displayed) Labs Reviewed  BLOOD GAS, VENOUS - Abnormal; Notable for the following components:      Result Value   pO2, Ven 47 (*)    Bicarbonate 29.7 (*)    Acid-Base Excess 3.7 (*)    All other components within normal limits  RESP PANEL BY RT-PCR (RSV, FLU A&B, COVID)  RVPGX2  CBC WITH DIFFERENTIAL/PLATELET  BASIC METABOLIC PANEL WITH GFR  BRAIN NATRIURETIC PEPTIDE  MAGNESIUM   TROPONIN I (HIGH SENSITIVITY)    EKG: None  Radiology: DG Chest Port 1 View Result Date: 07/08/2024 EXAM: 1 VIEW XRAY OF THE CHEST 07/08/2024 06:31:00 AM COMPARISON: PA and lateral radiographs of the chest dated 05/26/2022. CLINICAL HISTORY: Increasing shortness of breath for 24 hours,  shoulder replacement 08/01. FINDINGS: LUNGS AND PLEURA: Hazy coarse opacities present within the lung bases bilaterally, likely reflecting a combination of alveolar and interstitial edema. No pleural effusion. No pneumothorax. HEART AND MEDIASTINUM: No acute abnormality of the cardiac and mediastinal silhouettes. BONES AND SOFT TISSUES: The patient is status post bilateral reverse shoulder arthroplasties. No acute osseous abnormality. ARTIFACTS: There is a circular artifact overlying the right lower lung zone. IMPRESSION: 1. Hazy opacities in the lung bases bilaterally, likely reflecting a combination of alveolar and interstitial edema. 2. Circular artifact overlying the right lower lung zone. Electronically signed by: evalene coho 07/08/2024 07:10 AM EDT RP Workstation: [id number]     .Critical Care  Performed by:  Hildegard Loge, PA-C Authorized by: Hildegard Loge, PA-C   Critical care provider statement:    Critical care time (minutes):  40   Critical care was necessary to treat or prevent imminent or life-threatening deterioration of the following conditions: ACS, heparin  drip.   Critical care was time spent personally by me on the following activities:  Development of treatment plan with patient or surrogate, discussions with consultants, evaluation of patient's response to treatment, examination of patient, ordering and review of laboratory studies, ordering and review of radiographic studies, ordering and performing treatments and interventions, pulse oximetry, re-evaluation of patient's condition and review of old charts   Care discussed with: admitting provider      Medications Ordered in the ED - No data to display  Clinical Course as of 07/08/24 0927  [facility] Jul 08, 2024  0804 Troponin elevated to 355.  Remains without chest pain.  Chest x-ray reveals potential opacities or fluid.  Will give dose of aspirin  and start patient on heparin  drip.  Will proceed with CT angio study. [AA]  [id number] No PE  on CT scan.  Will discuss with cardiology. [AA]  [id number] Spoke with Dr. Francyne of cardiology.  He looked over the angio study and does not see any significant coronary calcifications or plaques.  Recommends admission to medicine, trending troponin, obtaining an echo.  They will consult. [AA]    Clinical Course User Index [AA] Hildegard Loge, PA-C                                 Medical Decision Making Amount and/or Complexity of Data Reviewed Labs: ordered. Radiology: ordered.  Risk OTC drugs. Prescription drug management.   Medical Decision Making / ED Course   This patient presents to the ED for concern of shortness of breath, this involves an extensive number of treatment options, and is a complaint that carries with it a high risk of complications and morbidity.  The differential diagnosis includes ACS, PE, pneumonia, viral URI, CHF exacerbation, COPD exacerbation  MDM: 78 year old female presents today for concern of shortness of breath.  She is hypoxic.  I took her off of supplemental O2 and she desatted to 88%.  No chest pain.  CBC without leukocytosis or anemia.  BMP without acute concern.  VBG without acute concern.  Respiratory panel negative. EKG without acute ischemic changes. Chest x-ray without acute cardiopulmonary process. CT angio study without evidence of PE.  Potential bronchitis. Initial troponin of 355.  Delta pending.  BNP slightly elevated at 106.  Discussed with Dr. Francyne.  Will admit to medicine. Aspirin  and heparin  initiated.  Discussed with hospitalist.    Lab Tests: -I ordered, reviewed, and interpreted labs.   The pertinent results include:   Labs Reviewed  CBC WITH DIFFERENTIAL/PLATELET - Abnormal; Notable for the following components:      Result Value   RDW 16.1 (*)    Platelets 144 (*)    All other components within normal limits  BASIC METABOLIC PANEL WITH GFR - Abnormal; Notable for the following components:   Glucose, Bld 114 (*)     Calcium 8.8 (*)    All other components within normal limits  BRAIN NATRIURETIC PEPTIDE - Abnormal; Notable for the following components:   B Natriuretic Peptide 106.6 (*)    All other components within normal limits  BLOOD GAS, VENOUS - Abnormal; Notable for the following components:   pO2, Ven 47 (*)  Bicarbonate 29.7 (*)    Acid-Base Excess 3.7 (*)    All other components within normal limits  TROPONIN I (HIGH SENSITIVITY) - Abnormal; Notable for the following components:   Troponin I (High Sensitivity) 355 (*)    All other components within normal limits  RESP PANEL BY RT-PCR (RSV, FLU A&B, COVID)  RVPGX2  MAGNESIUM   TROPONIN I (HIGH SENSITIVITY)      EKG  EKG Interpretation Date/Time:  Monday July 08 2024 06:37:30 EDT Ventricular Rate:  94 PR Interval:  142 QRS Duration:  82 QT Interval:  314 QTC Calculation: 393 R Axis:   46  Text Interpretation: Sinus tachycardia Atrial premature complexes Low voltage, precordial leads Anteroseptal infarct, old Confirmed by Elnor Savant ([id number]) on 07/08/2024 7:39:56 AM         Imaging Studies ordered: I ordered imaging studies including chest x-ray, CT angio PE study I independently visualized and interpreted imaging. I agree with the radiologist interpretation   Medicines ordered and prescription drug management: Meds ordered this encounter  Medications   iohexol  (OMNIPAQUE ) 350 MG/ML injection 100 mL   aspirin  tablet 325 mg   heparin  bolus via infusion 4,000 Units   heparin  ADULT infusion 100 units/mL (25000 units/250mL)    -I have reviewed the patients home medicines and have made adjustments as needed  Critical interventions ACS, heparin  drip good family support  Consultations Obtained: I requested consultation with the cardiology,  and discussed lab and imaging findings as well as pertinent plan - they recommend: As above   Cardiac Monitoring: The patient was maintained on a cardiac monitor.  I personally viewed  and interpreted the cardiac monitored which showed an underlying rhythm of: Normal sinus rhythm to sinus tach  Social Determinants of Health:  Factors impacting patients care include: Good family support   Reevaluation: After the interventions noted above, I reevaluated the patient and found that they have :stayed the same  Co morbidities that complicate the patient evaluation  Past Medical History:  Diagnosis Date   Acquired solitary kidney    right side 2014   Arthritis    rt knee rt shoulder   Bronchitis 05/08/2023   Cancer (HCC)    renal cell carcinoma- left kidney removed and left breast cancer   Complication of anesthesia    slow to wake, hard to take a deep breath with 1 surgery several yrs ago   Foley catheter in place    Frequency of urination    Heart murmur    per told by pcp approx. 02/ 2022 heard a very faint murmur, told did need work-up done at this time   History of COVID-19 2021   all symptoms resolved   History of Hashimoto thyroiditis    s/p  total thyroidectomy 1995   History of hypercalcemia    s/p  parathryoidectomy 07/ 2021   History of kidney stones    History of renal cell carcinoma 2014   s/p  left nephrectomy, pt stated no other treatment   Hypertension    followed by pcp   Hypothyroidism, postsurgical 1995   followed by pcp in [place], moved from [place]  01/ 2021   OSA on CPAP    Thickened endometrium    UTI (urinary tract infection)    finished keflex  08-12-2022   Wears glasses       Dispostion: Discussed with hospitalist will evaluate patient for admission   Final diagnoses:  None    ED Discharge Orders     None  Hildegard Loge, PA-C 07/08/24 [id number]    Elnor Jayson LABOR, DO 07/15/24 [id number]

## 2024-07-08 NOTE — H&P (Signed)
 History and Physical  BRENDALY TOWNSEL FMW:[id number] DOB: 09/23/1946 DOA: 07/08/2024  PCP: Okey Carlin Redbird, MD   Chief Complaint: Chest pressure  HPI: Brooke Duncan is a 78 y.o. female with medical history significant for breast cancer on letrozole , hypertension, hypothyroidism, family history of early cardiac death being admitted to the hospital with NSTEMI.  She just had a reverse right total shoulder replacement on 8/1 and recovered well.  She had some shortness of breath which she describes as feeling like she cannot take a full breath, which started on 8/2 the day after her surgery.  She has no associated cough, denies any chest pain, nausea, fever or any other symptoms.  Workup in the emergency department which has been quite complete has ruled out pneumonia, PE, or acute EKG changes.  She has an elevated initial troponin of 355.  ER provider discussed with on-call cardiology, who recommended IV heparin  drip, admission to [facility] and echo.  Review of Systems: Please see HPI for pertinent positives and negatives. A complete 10 system review of systems are otherwise negative.  Past Medical History:  Diagnosis Date   Acquired solitary kidney    right side 2014   Arthritis    rt knee rt shoulder   Bronchitis 05/08/2023   Cancer (HCC)    renal cell carcinoma- left kidney removed and left breast cancer   Complication of anesthesia    slow to wake, hard to take a deep breath with 1 surgery several yrs ago   Foley catheter in place    Frequency of urination    Heart murmur    per told by pcp approx. 02/ 2022 heard a very faint murmur, told did need work-up done at this time   History of COVID-19 2021   all symptoms resolved   History of Hashimoto thyroiditis    s/p  total thyroidectomy 1995   History of hypercalcemia    s/p  parathryoidectomy 07/ 2021   History of kidney stones    History of renal cell carcinoma 2014   s/p  left nephrectomy, pt stated no other treatment    Hypertension    followed by pcp   Hypothyroidism, postsurgical 1995   followed by pcp in [place], moved from [place]  01/ 2021   OSA on CPAP    Thickened endometrium    UTI (urinary tract infection)    finished keflex  08-12-2022   Wears glasses    Past Surgical History:  Procedure Laterality Date   BREAST BIOPSY Left 08/24/2021   BREAST LUMPECTOMY WITH RADIOACTIVE SEED LOCALIZATION Left 07/14/2022   Procedure: LEFT BREAST LUMPECTOMY WITH RADIOACTIVE SEED LOCALIZATION;  Surgeon: Ebbie Cough, MD;  Location: [facility];  Service: General;  Laterality: Left;   COLON SURGERY     10/2022 at [facility]   CYSTOSCOPY  08/04/2022   in dr winter office   CYSTOSCOPY WITH BIOPSY N/A 09/02/2022   Procedure: CYSTOSCOPY WITH BLADDER BIOPSY;  Surgeon: Devere Lonni Righter, MD;  Location: [facility];  Service: Urology;  Laterality: N/A;  ONLY NEEDS 30 MIN   HYSTEROSCOPY WITH D & C N/A 04/08/2021   Procedure: DILATATION AND CURETTAGE /HYSTEROSCOPY, EXCISION OF VAGINAL LESION;  Surgeon: Darcel Pool, MD;  Location: [facility];  Service: Gynecology;  Laterality: N/A;   LAPAROSCOPIC CHOLECYSTECTOMY  2005   NEPHRECTOMY Left 2014   PARATHYROIDECTOMY  07/ 2021  in [place]    REVERSE SHOULDER ARTHROPLASTY Left 06/16/2023   Procedure: REVERSE SHOULDER ARTHROPLASTY;  Surgeon:  Kay Kemps, MD;  Location: [facility];  Service: Orthopedics;  Laterality: Left;   TONSILLECTOMY  age 23   TOTAL HIP ARTHROPLASTY Bilateral left 2015;  right 2009   TOTAL KNEE ARTHROPLASTY Left 2014   TOTAL THYROIDECTOMY Bilateral 1995   TRIGGER FINGER RELEASE Right 01/24/2024   Procedure: RELEASE TRIGGER FINGER/A-1 PULLEY MIDDLE AND RING;  Surgeon: Romona Harari, MD;  Location: [facility];  Service: Orthopedics;  Laterality: Right;  local   WISDOM TOOTH EXTRACTION Bilateral    Social History:  reports that she quit smoking about 50 years ago. Her smoking use  included cigarettes. She started smoking about 62 years ago. She has a 18 pack-year smoking history. She has never used smokeless tobacco. She reports that she does not drink alcohol and does not use drugs.  Allergies  Allergen Reactions   Wound Dressing Adhesive Rash    Adhesive tape causes rash, can use papee tape   Tape Rash and Other (See Comments)    Blistery rash DERMABOND    Family History  Problem Relation Age of Onset   Melanoma Father    Prostate cancer Father      Prior to Admission medications   Medication Sig Start Date End Date Taking? Authorizing Provider  acetaminophen  (TYLENOL ) 500 MG tablet Take 2 tablets (1,000 mg total) by mouth every 6 (six) hours as needed. 04/08/21   Darcel Pool, MD  albuterol  (VENTOLIN  HFA) 108 (90 Base) MCG/ACT inhaler Inhale 2 puffs into the lungs every 4 (four) hours as needed for shortness of breath or wheezing. 05/09/22   [provider]  Ascorbic Acid  (VITAMIN C  PO) Take 1 tablet by mouth daily.    [provider]  Cholecalciferol  (VITAMIN D3 SUPER STRENGTH) 50 MCG (2000 UT) CAPS Take 2,000 Units by mouth daily.    [provider]  clotrimazole  (LOTRIMIN ) 1 % cream Apply 1 application  topically 2 (two) times daily as needed (irritation).    [provider]  CRANBERRY PO Take 2 capsules by mouth daily.    [provider]  D-MANNOSE PO Take 4 capsules by mouth daily.    [provider]  erythromycin  ophthalmic ointment Place 1 application  into both eyes daily as needed (blepharitis).    [provider]  hydrocortisone  2.5 % cream Apply 1 Application topically 2 (two) times daily as needed (irritation).    [provider]  letrozole  (FEMARA ) 2.5 MG tablet Take 1 tablet (2.5 mg total) by mouth daily. 04/03/24   Gudena, Vinay, MD  levothyroxine  (SYNTHROID ) 150 MCG tablet Take 150 mcg by mouth daily before breakfast.    [provider]  losartan  (COZAAR ) 100 MG  tablet Take 100 mg by mouth daily.    [provider]  methocarbamol  (ROBAXIN ) 500 MG tablet Take 1 tablet (500 mg total) by mouth every 8 (eight) hours as needed for muscle spasms. Patient not taking: Reported on 06/20/2024 06/16/23   Kay Kemps, MD  NON FORMULARY Pt uses a cpap nightly    [provider]  ondansetron  (ZOFRAN ) 4 MG tablet Take 1 tablet (4 mg total) by mouth every 8 (eight) hours as needed for nausea, vomiting or refractory nausea / vomiting. 07/05/24 07/05/25  Kay Kemps, MD  oxybutynin  (DITROPAN ) 5 MG tablet Take 1 tablet (5 mg total) by mouth every 8 (eight) hours as needed for bladder spasms. 09/02/22   Devere Lonni Righter, MD  Probiotic Product (PROBIOTIC DAILY PO) Take 1 capsule by mouth daily.  [provider]  simvastatin  (ZOCOR ) 10 MG tablet Take 10 mg by mouth at bedtime.    [provider]  tamsulosin  (FLOMAX ) 0.4 MG CAPS capsule Take 1 capsule (0.4 mg total) by mouth daily. Patient not taking: Reported on 06/20/2024 03/10/22   Gudena, Vinay, MD  traMADol  (ULTRAM ) 50 MG tablet Take 1 tablet (50 mg total) by mouth every 6 (six) hours as needed. 07/05/24 07/05/25  Kay Kemps, MD  vitamin B-12 (CYANOCOBALAMIN ) 1000 MCG tablet Take 1,000 mcg by mouth daily.    [provider]    Physical Exam: BP 138/83   Pulse 94   Temp 99.2 F (37.3 C) (Oral)   Resp 17   SpO2 96%  General:  Alert, oriented, calm, in no acute distress, her niece is at the bedside Cardiovascular: RRR, no murmurs or rubs, no peripheral edema  Respiratory: clear to auscultation bilaterally, no wheezes, no crackles  Abdomen: soft, nontender, nondistended, normal bowel tones heard  Skin: dry, no rashes  Musculoskeletal: no joint effusions, normal range of motion  Psychiatric: appropriate affect, normal speech  Neurologic: extraocular muscles intact, clear speech, moving all extremities with intact sensorium         Labs on Admission:  Basic Metabolic  Panel: Recent Labs  Lab 07/08/24 0606  NA 135  K 4.2  CL 99  CO2 26  GLUCOSE 114*  BUN 11  CREATININE 0.61  CALCIUM 8.8*  MG 1.8   Liver Function Tests: No results for input(s): AST, ALT, ALKPHOS, BILITOT, PROT, ALBUMIN in the last 168 hours. No results for input(s): LIPASE, AMYLASE in the last 168 hours. No results for input(s): AMMONIA in the last 168 hours. CBC: Recent Labs  Lab 07/08/24 0606  WBC 10.0  NEUTROABS 7.6  HGB 12.5  HCT 39.7  MCV 88.8  PLT 144*   Cardiac Enzymes: No results for input(s): CKTOTAL, CKMB, CKMBINDEX, TROPONINI in the last 168 hours. BNP (last 3 results) Recent Labs    07/08/24 0606  BNP 106.6*    ProBNP (last 3 results) No results for input(s): PROBNP in the last 8760 hours.  CBG: No results for input(s): GLUCAP in the last 168 hours.  Radiological Exams on Admission: CT Angio Chest PE W and/or Wo Contrast Result Date: 07/08/2024 CLINICAL DATA:  Pulmonary embolism (PE) suspected, high prob. Recent shoulder surgery. Shortness of breath. EXAM: CT ANGIOGRAPHY CHEST WITH CONTRAST TECHNIQUE: Multidetector CT imaging of the chest was performed using the standard protocol during bolus administration of intravenous contrast. Multiplanar CT image reconstructions and MIPs were obtained to evaluate the vascular anatomy. RADIATION DOSE REDUCTION: This exam was performed according to the departmental dose-optimization program which includes automated exposure control, adjustment of the mA and/or kV according to patient size and/or use of iterative reconstruction technique. CONTRAST:  OMNIPAQUE  IOHEXOL  350 MG/ML SOLN COMPARISON:  None Available. FINDINGS: Cardiovascular: Evaluation of pulmonary embolism beyond the segmental branches is limited due to suboptimal contrast-enhancement and patient's respiratory motion. There is no embolism to the segmental pulmonary artery level. Mild cardiomegaly. No pericardial effusion. No  aortic aneurysm. Mediastinum/Nodes: Visualized thyroid gland appears grossly unremarkable. No solid / cystic mediastinal masses. The esophagus is nondistended precluding optimal assessment. No axillary, mediastinal or hilar lymphadenopathy by size criteria. Lungs/Pleura: The central tracheo-bronchial tree is patent. There is mild, smooth, circumferential thickening of the segmental and subsegmental bronchial walls, throughout bilateral lungs, which is nonspecific. Findings are most commonly seen with bronchitis or reactive airway disease, such as asthma. There is mosaic  attenuation of lungs, consistent with heterogeneous air trapping related to small airways disease. There are patchy areas of linear, plate-like atelectasis throughout bilateral lungs. No mass or consolidation. No pleural effusion or pneumothorax. No suspicious lung nodules. Upper Abdomen: Surgically absent gallbladder. There are scattered diverticula in the splenic flexure of colon without diverticulitis. There is probable tiny sliding hiatal hernia. Remaining visualized upper abdominal viscera within normal limits. Musculoskeletal: Patient is status post recent right reverse shoulder arthroplasty. There is air in the surrounding soft tissue, related to surgery. The visualized soft tissues of the chest wall are otherwise grossly unremarkable. No suspicious osseous lesions. There are mild multilevel degenerative changes in the visualized spine. Review of the MIP images confirms the above findings. IMPRESSION: 1. No embolism to the segmental pulmonary artery level. 2. No lung mass, consolidation, pleural effusion or pneumothorax. Patchy areas of atelectasis throughout bilateral lungs. 3. Multiple other nonacute observations, as described above. Electronically Signed   By: Ree Molt M.D.   On: 07/08/2024 08:35   DG Chest Port 1 View Result Date: 07/08/2024 EXAM: 1 VIEW XRAY OF THE CHEST 07/08/2024 06:31:00 AM COMPARISON: PA and lateral  radiographs of the chest dated 05/26/2022. CLINICAL HISTORY: Increasing shortness of breath for 24 hours, shoulder replacement 08/01. FINDINGS: LUNGS AND PLEURA: Hazy coarse opacities present within the lung bases bilaterally, likely reflecting a combination of alveolar and interstitial edema. No pleural effusion. No pneumothorax. HEART AND MEDIASTINUM: No acute abnormality of the cardiac and mediastinal silhouettes. BONES AND SOFT TISSUES: The patient is status post bilateral reverse shoulder arthroplasties. No acute osseous abnormality. ARTIFACTS: There is a circular artifact overlying the right lower lung zone. IMPRESSION: 1. Hazy opacities in the lung bases bilaterally, likely reflecting a combination of alveolar and interstitial edema. 2. Circular artifact overlying the right lower lung zone. Electronically signed by: evalene coho 07/08/2024 07:10 AM EDT RP Workstation: [id number]   Assessment/Plan BENNYE NIX is a 78 y.o. female with medical history significant for breast cancer on letrozole , hypertension, hypothyroidism, family history of early cardiac death being admitted to the hospital with NSTEMI.  NSTEMI-with dyspnea, chest pressure, no chest pain.  No EKG changes.  Initial troponin 355. -Inpatient admission to stepdown -Cardiac monitor -Trend troponin -Given ASA 325 in the ER -IV heparin  drip -ER provider discussed with cardiology who will consult -2D echo  Hypothyroidism-Synthroid   Right reverse shoulder arthroplasty-just had surgery 8/1, patient states she has been quite constipated, without abdominal pain or nausea and therefore has been trying to avoid taking pain medication -Continue tramadol  as needed -Patient will be started on aggressive bowel regimen  Hypertension-losartan   History of breast cancer-continue Femara   Hyperlipidemia-Zocor     Code Status: Full Code  Consults called: Cardiology  Admission status: The appropriate patient status for this  patient is INPATIENT. Inpatient status is judged to be reasonable and necessary in order to provide the required intensity of service to ensure the patient's safety. The patient's presenting symptoms, physical exam findings, and initial radiographic and laboratory data in the context of their chronic comorbidities is felt to place them at high risk for further clinical deterioration. Furthermore, it is not anticipated that the patient will be medically stable for discharge from the hospital within 2 midnights of admission.    I certify that at the point of admission it is my clinical judgment that the patient will require inpatient hospital care spanning beyond 2 midnights from the point of admission due to high intensity of service, high risk for further deterioration  and high frequency of surveillance required  Time spent: 49 minutes  Calel Pisarski CHRISTELLA Gail MD [facility] Pager [phone]  If 7PM-7AM, please contact night-coverage [email or web address] Password [email or web address]  07/08/2024, 9:56 AM

## 2024-07-08 NOTE — Plan of Care (Signed)
  Problem: Coping: Goal: Ability to adjust to condition or change in health will improve Outcome: Progressing   Problem: Fluid Volume: Goal: Ability to maintain a balanced intake and output will improve Outcome: Progressing   Problem: Metabolic: Goal: Ability to maintain appropriate glucose levels will improve Outcome: Progressing   Problem: Education: Goal: Knowledge of General Education information will improve Description: Including pain rating scale, medication(s)/side effects and non-pharmacologic comfort measures Outcome: Progressing   Problem: Health Behavior/Discharge Planning: Goal: Ability to manage health-related needs will improve Outcome: Progressing   Problem: Clinical Measurements: Goal: Will remain free from infection Outcome: Progressing   Problem: Activity: Goal: Risk for activity intolerance will decrease Outcome: Progressing   Problem: Nutrition: Goal: Adequate nutrition will be maintained Outcome: Progressing

## 2024-07-09 ENCOUNTER — Encounter (HOSPITAL_COMMUNITY): Payer: Self-pay | Admitting: Internal Medicine

## 2024-07-09 DIAGNOSIS — I2489 Other forms of acute ischemic heart disease: Secondary | ICD-10-CM

## 2024-07-09 DIAGNOSIS — I50813 Acute on chronic right heart failure: Secondary | ICD-10-CM

## 2024-07-09 DIAGNOSIS — G4733 Obstructive sleep apnea (adult) (pediatric): Secondary | ICD-10-CM

## 2024-07-09 DIAGNOSIS — I10 Essential (primary) hypertension: Secondary | ICD-10-CM | POA: Diagnosis not present

## 2024-07-09 DIAGNOSIS — I214 Non-ST elevation (NSTEMI) myocardial infarction: Secondary | ICD-10-CM | POA: Diagnosis not present

## 2024-07-09 LAB — GLUCOSE, CAPILLARY
Glucose-Capillary: 92 mg/dL (ref 70–99)
Glucose-Capillary: 119 mg/dL — ABNORMAL HIGH (ref 70–99)
Glucose-Capillary: 160 mg/dL — ABNORMAL HIGH (ref 70–99)
Glucose-Capillary: 154 mg/dL — ABNORMAL HIGH (ref 70–99)

## 2024-07-09 LAB — HEMOGLOBIN A1C
Hgb A1c MFr Bld: 5.6 % (ref 4.8–5.6)
Mean Plasma Glucose: 114 mg/dL

## 2024-07-09 LAB — CBC
HCT: 43.3 % (ref 36.0–46.0)
Hemoglobin: 13 g/dL (ref 12.0–15.0)
MCH: 27.2 pg (ref 26.0–34.0)
MCHC: 30 g/dL (ref 30.0–36.0)
MCV: 90.6 fL (ref 80.0–100.0)
Platelets: 136 K/uL — ABNORMAL LOW (ref 150–400)
RBC: 4.78 MIL/uL (ref 3.87–5.11)
RDW: 16.1 % — ABNORMAL HIGH (ref 11.5–15.5)
WBC: 10.5 K/uL (ref 4.0–10.5)
nRBC: 0 % (ref 0.0–0.2)

## 2024-07-09 LAB — HEPARIN LEVEL (UNFRACTIONATED): Heparin Unfractionated: 0.53 [IU]/mL (ref 0.30–0.70)

## 2024-07-09 LAB — BASIC METABOLIC PANEL WITH GFR
Anion gap: 10 (ref 5–15)
BUN: 15 mg/dL (ref 8–23)
CO2: 28 mmol/L (ref 22–32)
Calcium: 8.9 mg/dL (ref 8.9–10.3)
Chloride: 98 mmol/L (ref 98–111)
Creatinine, Ser: 0.35 mg/dL — ABNORMAL LOW (ref 0.44–1.00)
GFR, Estimated: >60 mL/min (ref >60)
Glucose, Bld: 105 mg/dL — ABNORMAL HIGH (ref 70–99)
Potassium: 4 mmol/L (ref 3.5–5.1)
Sodium: 136 mmol/L (ref 135–145)

## 2024-07-09 MED ORDER — AMLODIPINE BESYLATE 10 MG PO TABS
10.0000 mg | ORAL_TABLET | Freq: Every day | ORAL | Status: DC
  Administered 2024-07-09 – 2024-07-10 (×2): 10 mg via ORAL
  Filled 2024-07-09 (×2): qty 1

## 2024-07-09 MED ORDER — ENOXAPARIN SODIUM 60 MG/0.6ML IJ SOSY
60.0000 mg | PREFILLED_SYRINGE | INTRAMUSCULAR | Status: DC
  Administered 2024-07-10: 60 mg via SUBCUTANEOUS
  Filled 2024-07-09: qty 0.6

## 2024-07-09 NOTE — Progress Notes (Signed)
 Orthopedics Progress Note  Subjective: Patient states that she is doing well with the shoulder and feeling better today. She relayed to me her SOB that began POD #1 after her TSA last Friday and progressed Sunday. She was admitted and worked up for elevated Troponin and concern for cardiac ischemia and volume overload.  Objective:  Vitals:   07/09/24 1304 07/09/24 1319  BP:  112/63  Pulse: 79 80  Resp: 20 20  Temp:  98.3 F (36.8 C)  SpO2: 95% 95%    General: Awake and alert  Musculoskeletal: right shoulder incision CDI, dressing in place. Minimal swelling in the shoulder and the arm on the right. Pain free AROM of the shoulder Neurovascularly intact  Lab Results  Component Value Date   WBC 10.5 07/09/2024   HGB 13.0 07/09/2024   HCT 43.3 07/09/2024   MCV 90.6 07/09/2024   PLT 136 (L) 07/09/2024       Component Value Date/Time   NA 136 07/09/2024 0315   K 4.0 07/09/2024 0315   CL 98 07/09/2024 0315   CO2 28 07/09/2024 0315   GLUCOSE 105 (H) 07/09/2024 0315   BUN 15 07/09/2024 0315   CREATININE 0.35 (L) 07/09/2024 0315   CREATININE 0.77 10/28/2022 1921   CALCIUM 8.9 07/09/2024 0315   GFRNONAA >60 07/09/2024 0315   GFRNONAA >60 09/01/2021 1244    No results found for: INR, PROTIME  Assessment/Plan: POD #4 s/p Right Reverse TSA, complicated by post op SOB likely due to cardiac issues. Patient felt to be volume overloaded and has responded well to diuresis.  Symptoms have improved and she is scheduled for discharge to home tomorrow Recommend continued self directed shoulder and right arm exercises that we went over again.  No heavy push pull or lift Sling for when she is up moving around, may remove while seated.  Follow up in 1-2 weeks in the office  Elspeth R. Kay, MD 07/09/2024 5:28 PM

## 2024-07-09 NOTE — Plan of Care (Signed)
  Problem: Coping: Goal: Ability to adjust to condition or change in health will improve Outcome: Progressing   Problem: Fluid Volume: Goal: Ability to maintain a balanced intake and output will improve Outcome: Progressing   Problem: Health Behavior/Discharge Planning: Goal: Ability to manage health-related needs will improve Outcome: Progressing   Problem: Metabolic: Goal: Ability to maintain appropriate glucose levels will improve Outcome: Progressing   Problem: Nutritional: Goal: Maintenance of adequate nutrition will improve Outcome: Progressing Goal: Progress toward achieving an optimal weight will improve Outcome: Progressing   Problem: Clinical Measurements: Goal: Will remain free from infection Outcome: Progressing Goal: Diagnostic test results will improve Outcome: Progressing Goal: Respiratory complications will improve Outcome: Progressing Goal: Cardiovascular complication will be avoided Outcome: Progressing

## 2024-07-09 NOTE — Evaluation (Signed)
 Physical Therapy Evaluation Patient Details Name: Brooke Duncan MRN: [id number] DOB: 1946/07/03 Today's Date: 07/09/2024  History of Present Illness  Pt is a 78 yr old female who presented to ED on 07/08/2024 due to SOB. Pt was found to have NSTEMI and started on heparin  drip 8/4. 07/05/24 pt underwent R reverse total shoulder shoulder arthroplasty. Pt PMH includes but is not limited to: UTI, B THA, L TKA, HTN, OSA on CPAP, rencal cell carcinoma s/p L nephrectomy, L total shoulder  Clinical Impression      Pt admitted with above diagnosis.  Pt currently with functional limitations due to the deficits listed below (see PT Problem List). Pt seated in recliner when PT arrived. Family present. Pt agreeable to therapy eval and expressed concern per elevated Bp this am and new medication making her feel a little strange. Difficult to assess Bp when in standing due to placement of cuff on L distal LE. Pt required CGA for sit to stand  from recliner, min cues, good demonstration and recall for rollator brake management with use of L UE only. Gait assessed with rollator, CGA and min cues 50 feet in hallway no reports of SOB. Pt returned to recliner, family present and all needs in place.  Pt will benefit from acute skilled PT to increase their independence and safety with mobility to allow discharge.       If plan is discharge home, recommend the following: A little help with walking and/or transfers;A little help with bathing/dressing/bathroom;Assist for transportation;Assistance with cooking/housework   Can travel by private vehicle        Equipment Recommendations None recommended by PT  Recommendations for Other Services       Functional Status Assessment Patient has had a recent decline in their functional status and demonstrates the ability to make significant improvements in function in a reasonable and predictable amount of time.     Precautions / Restrictions Precautions Precautions:  Shoulder;Fall Type of Shoulder Precautions: NWB but per ortho note able to use walker, NO AROM/PROM at shoulder and sling at all times except ADLS and therapy exercises Shoulder Interventions: Shoulder sling/immobilizer Recall of Precautions/Restrictions: Intact Required Braces or Orthoses: Sling Restrictions Weight Bearing Restrictions Per Provider Order: No RUE Weight Bearing Per Provider Order: Non weight bearing Other Position/Activity Restrictions: but can use walker per md note      Mobility  Bed Mobility               General bed mobility comments: pt seated in recliner when PT arrived and returned to recliner at end of session    Transfers Overall transfer level: Needs assistance Equipment used: Rollator (4 wheels) Transfers: Sit to/from Stand Sit to Stand: Contact guard assist           General transfer comment: min cues, pt is able to adjust sling to allow for some slack to reach the handles on the rollator while maintaining sling in place    Ambulation/Gait Ambulation/Gait assistance: Contact guard assist Gait Distance (Feet): 50 Feet Assistive device: Rollator (4 wheels) Gait Pattern/deviations: Step-through pattern, Narrow base of support Gait velocity: decreased     General Gait Details: min cues pt is able to manage rollator brakes no reports of SOB and on RA 94-98%  Stairs            Wheelchair Mobility     Tilt Bed    Modified Rankin (Stroke Patients Only)       Balance Overall balance assessment: Needs  assistance Sitting-balance support: Feet supported Sitting balance-Leahy Scale: Good     Standing balance support: Single extremity supported, Bilateral upper extremity supported Standing balance-Leahy Scale: Fair Standing balance comment: static standing no UE support and no overt LOB                             Pertinent Vitals/Pain Pain Assessment Pain Assessment: 0-10 Pain Score: 5  Pain Location: R  shoulder and UE Pain Descriptors / Indicators: Aching, Constant, Discomfort, Dull, Grimacing Pain Intervention(s): Limited activity within patient's tolerance, Monitored during session, Premedicated before session, Repositioned    Home Living Family/patient expects to be discharged to:: Private residence Living Arrangements: Alone Available Help at Discharge: Family Type of Home: House Home Access: Level entry       Home Layout: One level Home Equipment: Rollator (4 wheels);Shower seat      Prior Function Prior Level of Function : Independent/Modified Independent             Mobility Comments: 4WW ADLs Comments: mod I prior to TSA on 8/1. pt reports needing some help from family and sleeping in a recliner     Extremity/Trunk Assessment   Upper Extremity Assessment RUE Deficits / Details: R reverse total shoulder sx    Lower Extremity Assessment Lower Extremity Assessment: Generalized weakness    Cervical / Trunk Assessment Cervical / Trunk Assessment: Kyphotic  Communication   Communication Communication: No apparent difficulties    Cognition Arousal: Alert Behavior During Therapy: WFL for tasks assessed/performed                             Following commands: Intact       Cueing Cueing Techniques: Verbal cues     General Comments      Exercises     Assessment/Plan    PT Assessment Patient needs continued PT services  PT Problem List Decreased strength;Decreased activity tolerance;Decreased balance;Decreased mobility;Decreased coordination;Pain       PT Treatment Interventions DME instruction;Gait training;Functional mobility training;Therapeutic activities;Therapeutic exercise;Balance training;Neuromuscular re-education;Patient/family education;Modalities    PT Goals (Current goals can be found in the Care Plan section)  Acute Rehab PT Goals Patient Stated Goal: to be able to go home and breathe PT Goal Formulation: With  patient Time For Goal Achievement: 07/23/24 Potential to Achieve Goals: Good    Frequency Min 3X/week     Co-evaluation               AM-PAC PT 6 Clicks Mobility  Outcome Measure Help needed turning from your back to your side while in a flat bed without using bedrails?: A Little Help needed moving from lying on your back to sitting on the side of a flat bed without using bedrails?: A Little Help needed moving to and from a bed to a chair (including a wheelchair)?: A Little Help needed standing up from a chair using your arms (e.g., wheelchair or bedside chair)?: A Little Help needed to walk in hospital room?: A Little Help needed climbing 3-5 steps with a railing? : Total 6 Click Score: 16    End of Session Equipment Utilized During Treatment: Gait belt (R UE sling) Activity Tolerance: Patient limited by fatigue Patient left: in chair;with call bell/phone within reach;with family/visitor present Nurse Communication: Mobility status PT Visit Diagnosis: Unsteadiness on feet (R26.81);Other abnormalities of gait and mobility (R26.89);Muscle weakness (generalized) (M62.81);Difficulty in walking, not elsewhere classified (  R26.2);Pain Pain - Right/Left: Right Pain - part of body: Shoulder    Time: 8965-8895 PT Time Calculation (min) (ACUTE ONLY): 30 min   Charges:   PT Evaluation $PT Eval Low Complexity: 1 Low PT Treatments $Gait Training: 8-22 mins PT General Charges $$ ACUTE PT VISIT: 1 Visit         [place], PT Acute Rehab   [place] VEAR Drone 07/09/2024, 11:58 AM

## 2024-07-09 NOTE — Progress Notes (Addendum)
 PROGRESS NOTE  ABBYGALE Duncan  FMW:[id number] DOB: 1946-06-09 DOA: 07/08/2024 PCP: Okey Carlin Redbird, MD   Brief Narrative: Patient is a 78 year old female with past medical history of breast cancer on letrozole , hypertension, history of sleep apnea not on CPAP, hypothyroidism, renal cell carcinoma status post nephrectomy who presented to the emergency department with complain of chest pressure.  She recently had reverse right shoulder shoulder replacement on 8/1.  She also complained of shortness of breath after the day of surgery.  Workup in the emergency department did not show any pneumonia or pulmonary embolism.  No acute change in the EKG.  Initial troponin of 355.  Started heparin  drip, cardiology consulted.  Suspected to have diastolic congestive heart failure, given  IV Lasix   Assessment & Plan:  Principal Problem:   NSTEMI (non-ST elevated myocardial infarction) (HCC)  Dyspnea/elevated troponin/diastolic CHF: Elevated troponin of 355, troponin trended down .no ischemic changes in the EKG.  Echo showed EF of 60 to 65%, no regional wall motion abnormality, mild concentric left ventricular hypertrophy, normal diastolic parameters, moderately reduced right ventricular systolic function. Given IV Lasix .  Also on heparin  drip, most likely will be discontinued today.  Hypertension: On losartan .  Currently she is hypertensive.  We will add amlodipine .  Recent right reverse shoulder arthroplasty: Had surgery on 8/1 after that she became dyspneic.  We recommend to follow-up with orthopedics as an outpatient.  Constipation: Continue bowel regimen  History of breast cancer: Continue Femara   History of renal cancer: Status post nephrectomy.  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  We recommend monitoring with CT imaging annually  Deconditioning: Consulting physical therapy          DVT prophylaxis:Place TED hose Start: 07/08/24 0853      Code Status: Full Code  Family Communication: None at bedside  Patient status:Inpatient  Patient is from :Home  Anticipated discharge un:ynfz  Estimated DC date:tomorrow   Consultants: Cardiology  Procedures:None  Antimicrobials:  Anti-infectives (From admission, onward)    None       Subjective: Patient seen and examined at bedside today.  She feels very comfortable this morning.  Not short of breath.  On room air.  Denies any chest pain.  Hypertensive this morning.  We discussed about starting on amlodipine .  She had a good diuresis yesterday.  Objective: Vitals:   07/09/24 0500 07/09/24 0532 07/09/24 0600 07/09/24 0643  BP: (!) 187/65 (!) 186/67 (!) 176/53 (!) 183/60  Pulse: 63 61 74 67  Resp: 16 12 15 17   Temp:      TempSrc:      SpO2: 97% 98% 99% 100%  Weight:      Height:        Intake/Output Summary (Last 24 hours) at 07/09/2024 0817 Last data filed at 07/09/2024 0340 Gross per 24 hour  Intake 590.26 ml  Output 3325 ml  Net -2734.74 ml   Filed Weights   07/08/24 1211  Weight: 118.8 kg    Examination:  General exam: Overall comfortable, not in distress,obese HEENT: PERRL Respiratory system:  no wheezes or crackles  Cardiovascular system: S1 & S2 heard, RRR.  Gastrointestinal system: Abdomen is nondistended, soft and nontender. Central nervous system: Alert and oriented Extremities: No edema, no clubbing ,no cyanosis, sling on the right upper extremity Skin: No rashes, no ulcers,no icterus     Data Reviewed: I have personally reviewed following labs and imaging studies  CBC: Recent Labs  Lab 07/08/24 0606  07/09/24 0315  WBC 10.0 10.5  NEUTROABS 7.6  --   HGB 12.5 13.0  HCT 39.7 43.3  MCV 88.8 90.6  PLT 144* 136*   Basic Metabolic Panel: Recent Labs  Lab 07/08/24 0606 07/09/24 0315  NA 135 136  K 4.2 4.0  CL 99 98  CO2 26 28  GLUCOSE 114* 105*  BUN 11 15  CREATININE 0.61 0.35*  CALCIUM 8.8* 8.9  MG 1.8  --      Recent  Results (from the past 240 hours)  Resp panel by RT-PCR (RSV, Flu A&B, Covid) Anterior Nasal Swab     Status: None   Collection Time: 07/08/24  6:28 AM   Specimen: Anterior Nasal Swab  Result Value Ref Range Status   SARS Coronavirus 2 by RT PCR NEGATIVE NEGATIVE Final    Comment: (NOTE) SARS-CoV-2 target nucleic acids are NOT DETECTED.  The SARS-CoV-2 RNA is generally detectable in upper respiratory specimens during the acute phase of infection. The lowest concentration of SARS-CoV-2 viral copies this assay can detect is 138 copies/mL. A negative result does not preclude SARS-Cov-2 infection and should not be used as the sole basis for treatment or other patient management decisions. A negative result may occur with  improper specimen collection/handling, submission of specimen other than nasopharyngeal swab, presence of viral mutation(s) within the areas targeted by this assay, and inadequate number of viral copies(<138 copies/mL). A negative result must be combined with clinical observations, patient history, and epidemiological information. The expected result is Negative.  Fact Sheet for Patients:  BloggerCourse.com  Fact Sheet for Healthcare Providers:  SeriousBroker.it  This test is no t yet approved or cleared by the [place]  FDA and  has been authorized for detection and/or diagnosis of SARS-CoV-2 by FDA under an Emergency Use Authorization (EUA). This EUA will remain  in effect (meaning this test can be used) for the duration of the COVID-19 declaration under Section 564(b)(1) of the Act, 21 U.S.C.section 360bbb-3(b)(1), unless the authorization is terminated  or revoked sooner.       Influenza A by PCR NEGATIVE NEGATIVE Final   Influenza B by PCR NEGATIVE NEGATIVE Final    Comment: (NOTE) The Xpert Xpress SARS-CoV-2/FLU/RSV plus assay is intended as an aid in the diagnosis of influenza from Nasopharyngeal swab  specimens and should not be used as a sole basis for treatment. Nasal washings and aspirates are unacceptable for Xpert Xpress SARS-CoV-2/FLU/RSV testing.  Fact Sheet for Patients: BloggerCourse.com  Fact Sheet for Healthcare Providers: SeriousBroker.it  This test is not yet approved or cleared by the [place]  FDA and has been authorized for detection and/or diagnosis of SARS-CoV-2 by FDA under an Emergency Use Authorization (EUA). This EUA will remain in effect (meaning this test can be used) for the duration of the COVID-19 declaration under Section 564(b)(1) of the Act, 21 U.S.C. section 360bbb-3(b)(1), unless the authorization is terminated or revoked.     Resp Syncytial Virus by PCR NEGATIVE NEGATIVE Final    Comment: (NOTE) Fact Sheet for Patients: BloggerCourse.com  Fact Sheet for Healthcare Providers: SeriousBroker.it  This test is not yet approved or cleared by the [place]  FDA and has been authorized for detection and/or diagnosis of SARS-CoV-2 by FDA under an Emergency Use Authorization (EUA). This EUA will remain in effect (meaning this test can be used) for the duration of the COVID-19 declaration under Section 564(b)(1) of the Act, 21 U.S.C. section 360bbb-3(b)(1), unless the authorization is terminated or revoked.  Performed at [facility]  [facility], [address] [place], [place] [address]      Radiology Studies: ECHOCARDIOGRAM COMPLETE Result Date: 07/08/2024    ECHOCARDIOGRAM REPORT   Patient Name:   EMILYANNE MCGOUGH Gergen Date of Exam: 07/08/2024 Medical Rec #:  [id number]          Height:       66.0 in Accession #:    [id number]         Weight:       257.0 lb Date of Birth:  1946/03/07          BSA:          2.225 m Patient Age:    78 years           BP:           157/76 mmHg Patient Gender: F                  HR:           50 bpm. Exam  Location:  Inpatient Procedure: 2D Echo, Cardiac Doppler, Color Doppler and Intracardiac            Opacification Agent (Both Spectral and Color Flow Doppler were            utilized during procedure). Indications:    NSTEMI  History:        Patient has no prior history of Echocardiogram examinations.  Sonographer:    Philomena Daring Referring Phys: [id number] MIR [facility]  Sonographer Comments: Patient is obese and suboptimal apical window. IMPRESSIONS  1. Left ventricular ejection fraction, by estimation, is 60 to 65%. The left ventricle has normal function. The left ventricle has no regional wall motion abnormalities. There is mild concentric left ventricular hypertrophy. Left ventricular diastolic parameters were normal.  2. Tethering of the apex and free wall akinesis consistent with RV dysfunction. Right ventricular systolic function is moderately reduced. The right ventricular size is moderately enlarged.  3. The mitral valve is normal in structure. No evidence of mitral valve regurgitation. No evidence of mitral stenosis.  4. The aortic valve is tricuspid. Aortic valve regurgitation is not visualized. Aortic valve sclerosis is present, with no evidence of aortic valve stenosis.  5. The inferior vena cava is normal in size with greater than 50% respiratory variability, suggesting right atrial pressure of 3 mmHg. FINDINGS  Left Ventricle: Left ventricular ejection fraction, by estimation, is 60 to 65%. The left ventricle has normal function. The left ventricle has no regional wall motion abnormalities. Definity  contrast agent was given IV to delineate the left ventricular  endocardial borders. The left ventricular internal cavity size was normal in size. There is mild concentric left ventricular hypertrophy. Left ventricular diastolic parameters were normal. Right Ventricle: Tethering of the apex and free wall akinesis consistent with RV dysfunction. The right ventricular size is moderately enlarged. No increase  in right ventricular wall thickness. Right ventricular systolic function is moderately reduced. Left Atrium: Left atrial size was normal in size. Right Atrium: Right atrial size was normal in size. Pericardium: There is no evidence of pericardial effusion. Mitral Valve: The mitral valve is normal in structure. No evidence of mitral valve regurgitation. No evidence of mitral valve stenosis. Tricuspid Valve: The tricuspid valve is normal in structure. Tricuspid valve regurgitation is not demonstrated. No evidence of tricuspid stenosis. Aortic Valve: The aortic valve is tricuspid. Aortic valve regurgitation is not visualized. Aortic valve sclerosis is present, with no evidence of aortic valve stenosis. Pulmonic  Valve: The pulmonic valve was not well visualized. Pulmonic valve regurgitation is not visualized. No evidence of pulmonic stenosis. Aorta: The aortic root is normal in size and structure. Venous: The inferior vena cava is normal in size with greater than 50% respiratory variability, suggesting right atrial pressure of 3 mmHg. IAS/Shunts: No atrial level shunt detected by color flow Doppler.  LEFT VENTRICLE PLAX 2D LVIDd:         4.10 cm   Diastology LVIDs:         2.40 cm   LV e' lateral:   5.87 cm/s LV PW:         1.20 cm   LV E/e' lateral: 14.8 LV IVS:        1.30 cm LVOT diam:     1.70 cm LV SV:         53 LV SV Index:   24 LVOT Area:     2.27 cm  RIGHT VENTRICLE             IVC RV S prime:     14.70 cm/s  IVC diam: 2.20 cm TAPSE (M-mode): 2.0 cm LEFT ATRIUM             Index        RIGHT ATRIUM           Index LA diam:        2.60 cm 1.17 cm/m   RA Area:     12.80 cm LA Vol (A2C):   27.7 ml 12.45 ml/m  RA Volume:   27.00 ml  12.14 ml/m LA Vol (A4C):   30.1 ml 13.53 ml/m LA Biplane Vol: 29.6 ml 13.31 ml/m  AORTIC VALVE LVOT Vmax:   146.00 cm/s LVOT Vmean:  97.400 cm/s LVOT VTI:    0.233 m  AORTA Ao Root diam: 3.00 cm Ao Asc diam:  3.80 cm MITRAL VALVE                TRICUSPID VALVE MV Area (PHT): 3.46  cm     TR Peak grad:   11.2 mmHg MV Decel Time: 219 msec     TR Vmax:        167.00 cm/s MV E velocity: 86.60 cm/s MV A velocity: 123.00 cm/s  SHUNTS MV E/A ratio:  0.70         Systemic VTI:  0.23 m                             Systemic Diam: 1.70 cm Morene Brownie Electronically signed by Morene Brownie Signature Date/Time: 07/08/2024/6:14:57 PM    Final    CT Angio Chest PE W and/or Wo Contrast Result Date: 07/08/2024 CLINICAL DATA:  Pulmonary embolism (PE) suspected, high prob. Recent shoulder surgery. Shortness of breath. EXAM: CT ANGIOGRAPHY CHEST WITH CONTRAST TECHNIQUE: Multidetector CT imaging of the chest was performed using the standard protocol during bolus administration of intravenous contrast. Multiplanar CT image reconstructions and MIPs were obtained to evaluate the vascular anatomy. RADIATION DOSE REDUCTION: This exam was performed according to the departmental dose-optimization program which includes automated exposure control, adjustment of the mA and/or kV according to patient size and/or use of iterative reconstruction technique. CONTRAST:  OMNIPAQUE  IOHEXOL  350 MG/ML SOLN COMPARISON:  None Available. FINDINGS: Cardiovascular: Evaluation of pulmonary embolism beyond the segmental branches is limited due to suboptimal contrast-enhancement and patient's respiratory motion. There is no embolism to the segmental pulmonary artery level. Mild  cardiomegaly. No pericardial effusion. No aortic aneurysm. Mediastinum/Nodes: Visualized thyroid gland appears grossly unremarkable. No solid / cystic mediastinal masses. The esophagus is nondistended precluding optimal assessment. No axillary, mediastinal or hilar lymphadenopathy by size criteria. Lungs/Pleura: The central tracheo-bronchial tree is patent. There is mild, smooth, circumferential thickening of the segmental and subsegmental bronchial walls, throughout bilateral lungs, which is nonspecific. Findings are most commonly seen with bronchitis  or reactive airway disease, such as asthma. There is mosaic attenuation of lungs, consistent with heterogeneous air trapping related to small airways disease. There are patchy areas of linear, plate-like atelectasis throughout bilateral lungs. No mass or consolidation. No pleural effusion or pneumothorax. No suspicious lung nodules. Upper Abdomen: Surgically absent gallbladder. There are scattered diverticula in the splenic flexure of colon without diverticulitis. There is probable tiny sliding hiatal hernia. Remaining visualized upper abdominal viscera within normal limits. Musculoskeletal: Patient is status post recent right reverse shoulder arthroplasty. There is air in the surrounding soft tissue, related to surgery. The visualized soft tissues of the chest wall are otherwise grossly unremarkable. No suspicious osseous lesions. There are mild multilevel degenerative changes in the visualized spine. Review of the MIP images confirms the above findings. IMPRESSION: 1. No embolism to the segmental pulmonary artery level. 2. No lung mass, consolidation, pleural effusion or pneumothorax. Patchy areas of atelectasis throughout bilateral lungs. 3. Multiple other nonacute observations, as described above. Electronically Signed   By: Ree Molt M.D.   On: 07/08/2024 08:35   DG Chest Port 1 View Result Date: 07/08/2024 EXAM: 1 VIEW XRAY OF THE CHEST 07/08/2024 06:31:00 AM COMPARISON: PA and lateral radiographs of the chest dated 05/26/2022. CLINICAL HISTORY: Increasing shortness of breath for 24 hours, shoulder replacement 08/01. FINDINGS: LUNGS AND PLEURA: Hazy coarse opacities present within the lung bases bilaterally, likely reflecting a combination of alveolar and interstitial edema. No pleural effusion. No pneumothorax. HEART AND MEDIASTINUM: No acute abnormality of the cardiac and mediastinal silhouettes. BONES AND SOFT TISSUES: The patient is status post bilateral reverse shoulder arthroplasties. No acute  osseous abnormality. ARTIFACTS: There is a circular artifact overlying the right lower lung zone. IMPRESSION: 1. Hazy opacities in the lung bases bilaterally, likely reflecting a combination of alveolar and interstitial edema. 2. Circular artifact overlying the right lower lung zone. Electronically signed by: timothy berrigan 07/08/2024 07:10 AM EDT RP Workstation: [id number]    Scheduled Meds:  Chlorhexidine  Gluconate Cloth  6 each Topical Daily   hydrocortisone  cream   Topical BID   insulin  aspart  0-15 Units Subcutaneous TID WC   insulin  aspart  0-5 Units Subcutaneous QHS   letrozole   2.5 mg Oral Daily   levothyroxine   150 mcg Oral QAC breakfast   losartan   100 mg Oral Daily   polyethylene glycol  17 g Oral Daily   senna-docusate  2 tablet Oral BID   Continuous Infusions:  heparin  1,500 Units/hr (07/09/24 0334)     LOS: 1 day   Ivonne Mustache, MD [facility] P8/04/2024, 8:17 AM

## 2024-07-09 NOTE — Progress Notes (Signed)
 PHARMACY - ANTICOAGULATION CONSULT NOTE  Pharmacy Consult for heparin  Indication: chest pain/ACS  Allergies  Allergen Reactions   Wound Dressing Adhesive Rash    Adhesive tape causes rash, can use papee tape   Tape Rash and Other (See Comments)    Blistery rash DERMABOND    Patient Measurements: Height: 5' 6 (167.6 cm) Weight: 118.8 kg (261 lb 14.5 oz) IBW/kg (Calculated) : 59.3 HEPARIN  DW (KG): 87.5  Vital Signs: Temp: 97.8 F (36.6 C) (08/05 0815) Temp Source: Oral (08/05 0815) BP: 187/75 (08/05 0900) Pulse Rate: 80 (08/05 0900)  Labs: Recent Labs    07/08/24 0606 07/08/24 1220 07/08/24 1412 07/08/24 1625 07/08/24 2106 07/09/24 0315 07/09/24 0733  HGB 12.5  --   --   --   --  13.0  --   HCT 39.7  --   --   --   --  43.3  --   PLT 144*  --   --   --   --  136*  --   HEPARINUNFRC  --   --   --   --  0.18*  --  0.53  CREATININE 0.61  --   --   --   --  0.35*  --   TROPONINIHS 355* 262* 222* 213*  --   --   --     Estimated Creatinine Clearance: 77.3 mL/min (A) (by C-G formula based on SCr of 0.35 mg/dL (L)).  Medications: No anticoagulants PTA  Assessment: Pt is a 11 yoF who recently underwent reverse total shoulder arthroplasty on 07/05/24. No post-op anticoagulants prescribed on discharge. Pt presented to ED on 8/4 with SOB, requiring oxygen. Troponin 355. Pharmacy consulted to dose heparin  for ACS.  07/09/2024 Heparin  level 0.53 - therapeutic after 2500 unit bolus and rate increase to 1500 units/hr Hg WNL, PLT remains low @ 136 Troponin 355>262>222> 213 No bleeding reported   Goal of Therapy:  Heparin  level 0.3-0.7 units/ml Monitor platelets by anticoagulation protocol: Yes   Plan:  Continue heparin  drip @ 1500 units/hr Check 8 hour confirmatory heparin  level CBC, heparin  level daily Monitor for signs of bleeding F/u cardiology plans for heparin > DC soon?  Rosaline IVAR Edison, Pharm.D Use secure chat for questions 07/09/2024 10:06 AM

## 2024-07-09 NOTE — TOC Progression Note (Signed)
 Transition of Care [facility]) - Progression Note    Patient Details  Name: Brooke Duncan MRN: [id number] Date of Birth: Dec 18, 1945  [facility] CM/SW Contact  Jon ONEIDA Anon, RN Phone Number: 07/09/2024, 12:40 PM  Clinical Narrative:    NCM met with pt to discuss PT recommendation for [facility] PT. Pt is agreeable to recommendation and states she would like to receive services from [facility] where she also receives [facility] OT. NCM faxed over orders to Traci at [facility] to [phone]. Received successful [place] notice at 1253. Care Management continuing to follow.                      Expected Discharge Plan and Services                                   HH Arranged: PT HH Agency: Other - See comment ([facility]) Date [facility] Agency Contacted: 07/09/24 Time HH Agency Contacted: 1240 Representative spoke with at [facility] Agency: [facility]   Social Drivers of Health (SDOH) Interventions SDOH Screenings   Food Insecurity: No Food Insecurity (07/08/2024)  Housing: Low Risk  (07/08/2024)  Transportation Needs: No Transportation Needs (07/08/2024)  Utilities: Not At Risk (07/08/2024)  Depression (PHQ2-9): Medium Risk (03/10/2021)  Financial Resource Strain: Low Risk  (03/20/2024)   Received from [facility]  Physical Activity: Unknown (03/20/2024)   Received from [facility]  Social Connections: Moderately Isolated (07/08/2024)  Stress: Stress Concern Present (03/20/2024)   Received from [facility]  Tobacco Use: Medium Risk (07/05/2024)    Readmission Risk Interventions     No data to display

## 2024-07-09 NOTE — Progress Notes (Signed)
  Progress Note  Patient Name: Brooke Duncan Date of Encounter: 07/09/2024 [facility] Cardiologist: None   Interval Summary   Reports feeling much better today Concerned about her blood pressure Amlodipine  was added today, just given recently, waiting for BP recheck Reports feeling significantly less short of breath after her Lasix  dose  Vital Signs Vitals:   07/09/24 0700 07/09/24 0800 07/09/24 0815 07/09/24 0900  BP: (!) 159/52   (!) 187/75  Pulse: 60 73  80  Resp: 14 17  19   Temp:   97.8 F (36.6 C)   TempSrc:   Oral   SpO2: 100% 95%  93%  Weight:      Height:        Intake/Output Summary (Last 24 hours) at 07/09/2024 0953 Last data filed at 07/09/2024 0900 Gross per 24 hour  Intake 685.33 ml  Output 3325 ml  Net -2639.67 ml      07/08/2024   12:11 PM 07/05/2024   12:37 PM 06/28/2024   10:36 AM  Last 3 Weights  Weight (lbs) 261 lb 14.5 oz 257 lb 252 lb  Weight (kg) 118.8 kg 116.574 kg 114.306 kg     Telemetry/ECG  Sinus rhythm, HR 70s- Personally Reviewed  Physical Exam  GEN: No acute distress.   Neck: No JVD Cardiac: RRR, no murmurs, rubs, or gallops.  Respiratory: Clear to auscultation bilaterally. GI: Soft, nontender, non-distended  MS: No edema  Assessment & Plan   Elevated troponin level  Troponin 355 > 262 > 222 > 213 Consistent with demand ischemia in the setting of volume overload with recent surgery  Echo showed normal LVEF  Denies any chest pain, shortness of breath today Patient was started on IV heparin , likely okay to discontinue today   Dyspnea Diastolic heart failure  Echo showed: EF 60-65%, mild LVH, moderately reduced RV function, normal IVC  BNP 106 CTPA showed no PE but did note mild lung parenchymal findings  She reports OSA with noncompliance with CPAP  Given IV Lasix  40 mg x 1 dose, urine output 3.3 L yesterday  Creatinine 0.61 > 0.35 Patient appears euvolemic on exam  May benefit from PRN Lasix  at discharge    Hypertension  Most recent BP 187/75 Reports some mild lightheadedness today Currently on losartan  100 mg daily  Primary added amlodipine  10 mg daily starting today Depending on BP response to amlodipine , may benefit from additional agents   Per primary Recent right shoulder surgery History of renal cancer Mildly dilated thoracic aorta  History of breast cancer Constipation Hyperlipidemia    For questions or updates, please contact [facility] Please consult [email or web address] for contact info under       Signed, Waddell DELENA Donath, PA-C

## 2024-07-10 ENCOUNTER — Other Ambulatory Visit (HOSPITAL_COMMUNITY): Payer: Self-pay

## 2024-07-10 DIAGNOSIS — I214 Non-ST elevation (NSTEMI) myocardial infarction: Secondary | ICD-10-CM | POA: Diagnosis not present

## 2024-07-10 DIAGNOSIS — I2489 Other forms of acute ischemic heart disease: Secondary | ICD-10-CM | POA: Diagnosis not present

## 2024-07-10 DIAGNOSIS — I50813 Acute on chronic right heart failure: Secondary | ICD-10-CM | POA: Diagnosis not present

## 2024-07-10 DIAGNOSIS — G4733 Obstructive sleep apnea (adult) (pediatric): Secondary | ICD-10-CM | POA: Diagnosis not present

## 2024-07-10 LAB — BASIC METABOLIC PANEL WITH GFR
Anion gap: 10 (ref 5–15)
BUN: 16 mg/dL (ref 8–23)
CO2: 28 mmol/L (ref 22–32)
Calcium: 8.8 mg/dL — ABNORMAL LOW (ref 8.9–10.3)
Chloride: 96 mmol/L — ABNORMAL LOW (ref 98–111)
Creatinine, Ser: 0.74 mg/dL (ref 0.44–1.00)
GFR, Estimated: >60 mL/min (ref >60)
Glucose, Bld: 106 mg/dL — ABNORMAL HIGH (ref 70–99)
Potassium: 4.3 mmol/L (ref 3.5–5.1)
Sodium: 134 mmol/L — ABNORMAL LOW (ref 135–145)

## 2024-07-10 LAB — GLUCOSE, CAPILLARY: Glucose-Capillary: 102 mg/dL — ABNORMAL HIGH (ref 70–99)

## 2024-07-10 MED ORDER — NYSTATIN 100000 UNIT/GM EX CREA
TOPICAL_CREAM | Freq: Two times a day (BID) | CUTANEOUS | 0 refills | Status: AC
  Filled 2024-07-10: qty 30, 30d supply, fill #0

## 2024-07-10 MED ORDER — FUROSEMIDE 40 MG PO TABS
40.0000 mg | ORAL_TABLET | Freq: Every day | ORAL | 0 refills | Status: AC | PRN
  Filled 2024-07-10: qty 30, 30d supply, fill #0

## 2024-07-10 MED ORDER — AMLODIPINE BESYLATE 10 MG PO TABS
10.0000 mg | ORAL_TABLET | Freq: Every day | ORAL | 0 refills | Status: DC
  Filled 2024-07-10: qty 60, 60d supply, fill #0

## 2024-07-10 MED ORDER — NYSTATIN 100000 UNIT/GM EX CREA
TOPICAL_CREAM | Freq: Two times a day (BID) | CUTANEOUS | Status: DC
  Filled 2024-07-10: qty 30

## 2024-07-10 NOTE — Discharge Summary (Signed)
 Physician Discharge Summary  Brooke Duncan FMW:[id number] DOB: [birth year] DOA: 07/08/2024  PCP: Okey Carlin Redbird, MD  Admit date: 07/08/2024 Discharge date: 07/10/2024  Admitted From: Home Disposition:  Home  Discharge Condition:Stable CODE STATUS:FULL Diet recommendation: Heart Healthy  Brief/Interim Summary: Patient is a 78 year old female with past medical history of breast cancer on letrozole , hypertension, history of sleep apnea not on CPAP, hypothyroidism, renal cell carcinoma status post nephrectomy who presented to the emergency department with complain of chest pressure.  She recently had reverse right shoulder shoulder replacement on 8/1.  She also complained of shortness of breath after the day of surgery.  Workup in the emergency department did not show any pneumonia or pulmonary embolism.  No acute change in the EKG.  Initial troponin of 355.  Started heparin  drip, cardiology consulted.  Suspected to have diastolic congestive heart failure, given  IV Lasix .  Respiratory status significantly improved.  Currently she is on room air.  Heparin  drip has been discontinued.  PT recommended home health.  Medically stable for discharge home today.  Following problems were addressed during the hospitalization:  Dyspnea/elevated troponin/diastolic CHF: Elevated troponin of 355, troponin trended down .no ischemic changes in the EKG.  Echo showed EF of 60 to 65%, no regional wall motion abnormality, mild concentric left ventricular hypertrophy, normal diastolic parameters, moderately reduced right ventricular systolic function. Given IV Lasix .  Currently euvolemic.  Heparin  discontinued.  She can take Lasix  40 mg daily as needed for weight gain or lower extremity edema.  She will follow-up with cardiology as an outpatient. We recommend regular use of CPAP.  OSA could be the most likely cause for her right heart failure.   Hypertension: On losartan .  Added amlodipine .  Blood pressure stable  today  Recent right reverse shoulder arthroplasty: Had surgery on 8/1 after that she became dyspneic.  We recommend to follow-up with orthopedics as an outpatient.   History of breast cancer: Continue Femara    History of renal cancer: Status post nephrectomy.   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  We recommend monitoring with CT imaging annually   Deconditioning: PT recommended home health on discharge         Discharge Diagnoses:  Principal Problem:   NSTEMI (non-ST elevated myocardial infarction) (HCC) Active Problems:   Primary hypertension   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)   OSA (obstructive sleep apnea)   Demand ischemia of myocardium [facility])    Discharge Instructions  Discharge Instructions     Diet - low sodium heart healthy   Complete by: As directed    Discharge instructions   Complete by: As directed    1)Please take your medications as instructed 2)Follow up with your PCP next week 3)You will be called by cardiology for follow-up appointment.  Follow-up with orthopedics as an outpatient   Increase activity slowly   Complete by: As directed    No wound care   Complete by: As directed       Allergies as of 07/10/2024       Reactions   Wound Dressing Adhesive Rash   Adhesive tape causes rash, can use papee tape   Tape Rash, Other (See Comments)   Blistery rash DERMABOND        Medication List     TAKE these medications    acetaminophen  500 MG tablet Commonly known as: TYLENOL  Take 2 tablets (1,000 mg total) by mouth every 6 (six) hours as  needed. What changed: reasons to take this   albuterol  108 (90 Base) MCG/ACT inhaler Commonly known as: VENTOLIN  HFA Inhale 2 puffs into the lungs every 4 (four) hours as needed for shortness of breath or wheezing.   amLODipine  10 MG tablet Commonly known as: NORVASC  Take 1 tablet (10 mg total) by mouth daily. Start taking on: July 11, 2024    clotrimazole  1 % cream Commonly known as: LOTRIMIN  Apply 1 application  topically 2 (two) times daily as needed (irritation).   CRANBERRY PO Take 2 capsules by mouth daily.   cyanocobalamin  1000 MCG tablet Commonly known as: VITAMIN B12 Take 1,000 mcg by mouth daily.   D-MANNOSE PO Take 4 capsules by mouth daily.   furosemide  40 MG tablet Commonly known as: Lasix  Take 1 tablet (40 mg total) by mouth daily as needed. Take for more than 5 pounds weight gain or lower extremity edema   HYDROcodone -acetaminophen  5-325 MG tablet Commonly known as: NORCO/VICODIN Take 1 tablet by mouth every 6 (six) hours as needed for moderate pain (pain score 4-6) or severe pain (pain score 7-10).   hydrocortisone  2.5 % cream Apply 1 Application topically 2 (two) times daily as needed (irritation).   letrozole  2.5 MG tablet Commonly known as: FEMARA  Take 1 tablet (2.5 mg total) by mouth daily.   levothyroxine  150 MCG tablet Commonly known as: SYNTHROID  Take 150 mcg by mouth daily before breakfast.   losartan  100 MG tablet Commonly known as: COZAAR  Take 100 mg by mouth daily.   nystatin  cream Commonly known as: MYCOSTATIN  Apply topically 2 (two) times daily.   ondansetron  4 MG tablet Commonly known as: Zofran  Take 1 tablet (4 mg total) by mouth every 8 (eight) hours as needed for nausea, vomiting or refractory nausea / vomiting.   PROBIOTIC DAILY PO Take 1 capsule by mouth daily.   simvastatin  10 MG tablet Commonly known as: ZOCOR  Take 10 mg by mouth at bedtime.   traMADol  50 MG tablet Commonly known as: Ultram  Take 1 tablet (50 mg total) by mouth every 6 (six) hours as needed.   VITAMIN C  PO Take 1 tablet by mouth daily.   Vitamin D3 Super Strength 50 MCG (2000 UT) Caps Generic drug: Cholecalciferol  Take 2,000 Units by mouth daily.        Follow-up Information     Okey Carlin Redbird, MD. Schedule an appointment as soon as possible for a visit in 1 week(s).   Specialty:  Family Medicine Contact information: [address] [place] [address] [phone]                Allergies  Allergen Reactions   Wound Dressing Adhesive Rash    Adhesive tape causes rash, can use papee tape   Tape Rash and Other (See Comments)    Blistery rash DERMABOND    Consultations: Cardiology   Procedures/Studies: ECHOCARDIOGRAM COMPLETE Result Date: 07/08/2024    ECHOCARDIOGRAM REPORT   Patient Name:   Brooke Duncan Edwards Date of Exam: 07/08/2024 Medical Rec #:  [id number]          Height:       66.0 in Accession #:    [id number]         Weight:       257.0 lb Date of Birth:  May 19, 1946          BSA:          2.225 m Patient Age:    67 years  BP:           157/76 mmHg Patient Gender: F                  HR:           50 bpm. Exam Location:  Inpatient Procedure: 2D Echo, Cardiac Doppler, Color Doppler and Intracardiac            Opacification Agent (Both Spectral and Color Flow Doppler were            utilized during procedure). Indications:    NSTEMI  History:        Patient has no prior history of Echocardiogram examinations.  Sonographer:    Philomena Daring Referring Phys: [id number] MIR M [facility]  Sonographer Comments: Patient is obese and suboptimal apical window. IMPRESSIONS  1. Left ventricular ejection fraction, by estimation, is 60 to 65%. The left ventricle has normal function. The left ventricle has no regional wall motion abnormalities. There is mild concentric left ventricular hypertrophy. Left ventricular diastolic parameters were normal.  2. Tethering of the apex and free wall akinesis consistent with RV dysfunction. Right ventricular systolic function is moderately reduced. The right ventricular size is moderately enlarged.  3. The mitral valve is normal in structure. No evidence of mitral valve regurgitation. No evidence of mitral stenosis.  4. The aortic valve is tricuspid. Aortic valve regurgitation is not visualized. Aortic valve sclerosis is present,  with no evidence of aortic valve stenosis.  5. The inferior vena cava is normal in size with greater than 50% respiratory variability, suggesting right atrial pressure of 3 mmHg. FINDINGS  Left Ventricle: Left ventricular ejection fraction, by estimation, is 60 to 65%. The left ventricle has normal function. The left ventricle has no regional wall motion abnormalities. Definity  contrast agent was given IV to delineate the left ventricular  endocardial borders. The left ventricular internal cavity size was normal in size. There is mild concentric left ventricular hypertrophy. Left ventricular diastolic parameters were normal. Right Ventricle: Tethering of the apex and free wall akinesis consistent with RV dysfunction. The right ventricular size is moderately enlarged. No increase in right ventricular wall thickness. Right ventricular systolic function is moderately reduced. Left Atrium: Left atrial size was normal in size. Right Atrium: Right atrial size was normal in size. Pericardium: There is no evidence of pericardial effusion. Mitral Valve: The mitral valve is normal in structure. No evidence of mitral valve regurgitation. No evidence of mitral valve stenosis. Tricuspid Valve: The tricuspid valve is normal in structure. Tricuspid valve regurgitation is not demonstrated. No evidence of tricuspid stenosis. Aortic Valve: The aortic valve is tricuspid. Aortic valve regurgitation is not visualized. Aortic valve sclerosis is present, with no evidence of aortic valve stenosis. Pulmonic Valve: The pulmonic valve was not well visualized. Pulmonic valve regurgitation is not visualized. No evidence of pulmonic stenosis. Aorta: The aortic root is normal in size and structure. Venous: The inferior vena cava is normal in size with greater than 50% respiratory variability, suggesting right atrial pressure of 3 mmHg. IAS/Shunts: No atrial level shunt detected by color flow Doppler.  LEFT VENTRICLE PLAX 2D LVIDd:         4.10 cm    Diastology LVIDs:         2.40 cm   LV e' lateral:   5.87 cm/s LV PW:         1.20 cm   LV E/e' lateral: 14.8 LV IVS:        1.30 cm  LVOT diam:     1.70 cm LV SV:         53 LV SV Index:   24 LVOT Area:     2.27 cm  RIGHT VENTRICLE             IVC RV S prime:     14.70 cm/s  IVC diam: 2.20 cm TAPSE (M-mode): 2.0 cm LEFT ATRIUM             Index        RIGHT ATRIUM           Index LA diam:        2.60 cm 1.17 cm/m   RA Area:     12.80 cm LA Vol (A2C):   27.7 ml 12.45 ml/m  RA Volume:   27.00 ml  12.14 ml/m LA Vol (A4C):   30.1 ml 13.53 ml/m LA Biplane Vol: 29.6 ml 13.31 ml/m  AORTIC VALVE LVOT Vmax:   146.00 cm/s LVOT Vmean:  97.400 cm/s LVOT VTI:    0.233 m  AORTA Ao Root diam: 3.00 cm Ao Asc diam:  3.80 cm MITRAL VALVE                TRICUSPID VALVE MV Area (PHT): 3.46 cm     TR Peak grad:   11.2 mmHg MV Decel Time: 219 msec     TR Vmax:        167.00 cm/s MV E velocity: 86.60 cm/s MV A velocity: 123.00 cm/s  SHUNTS MV E/A ratio:  0.70         Systemic VTI:  0.23 m                             Systemic Diam: 1.70 cm Morene Brownie Electronically signed by Morene Brownie Signature Date/Time: 07/08/2024/6:14:57 PM    Final    CT Angio Chest PE W and/or Wo Contrast Result Date: 07/08/2024 CLINICAL DATA:  Pulmonary embolism (PE) suspected, high prob. Recent shoulder surgery. Shortness of breath. EXAM: CT ANGIOGRAPHY CHEST WITH CONTRAST TECHNIQUE: Multidetector CT imaging of the chest was performed using the standard protocol during bolus administration of intravenous contrast. Multiplanar CT image reconstructions and MIPs were obtained to evaluate the vascular anatomy. RADIATION DOSE REDUCTION: This exam was performed according to the departmental dose-optimization program which includes automated exposure control, adjustment of the mA and/or kV according to patient size and/or use of iterative reconstruction technique. CONTRAST:  OMNIPAQUE  IOHEXOL  350 MG/ML SOLN COMPARISON:  None Available. FINDINGS:  Cardiovascular: Evaluation of pulmonary embolism beyond the segmental branches is limited due to suboptimal contrast-enhancement and patient's respiratory motion. There is no embolism to the segmental pulmonary artery level. Mild cardiomegaly. No pericardial effusion. No aortic aneurysm. Mediastinum/Nodes: Visualized thyroid gland appears grossly unremarkable. No solid / cystic mediastinal masses. The esophagus is nondistended precluding optimal assessment. No axillary, mediastinal or hilar lymphadenopathy by size criteria. Lungs/Pleura: The central tracheo-bronchial tree is patent. There is mild, smooth, circumferential thickening of the segmental and subsegmental bronchial walls, throughout bilateral lungs, which is nonspecific. Findings are most commonly seen with bronchitis or reactive airway disease, such as asthma. There is mosaic attenuation of lungs, consistent with heterogeneous air trapping related to small airways disease. There are patchy areas of linear, plate-like atelectasis throughout bilateral lungs. No mass or consolidation. No pleural effusion or pneumothorax. No suspicious lung nodules. Upper Abdomen: Surgically absent gallbladder. There are scattered diverticula in the splenic flexure  of colon without diverticulitis. There is probable tiny sliding hiatal hernia. Remaining visualized upper abdominal viscera within normal limits. Musculoskeletal: Patient is status post recent right reverse shoulder arthroplasty. There is air in the surrounding soft tissue, related to surgery. The visualized soft tissues of the chest wall are otherwise grossly unremarkable. No suspicious osseous lesions. There are mild multilevel degenerative changes in the visualized spine. Review of the MIP images confirms the above findings. IMPRESSION: 1. No embolism to the segmental pulmonary artery level. 2. No lung mass, consolidation, pleural effusion or pneumothorax. Patchy areas of atelectasis throughout bilateral lungs.  3. Multiple other nonacute observations, as described above. Electronically Signed   By: Ree Molt M.D.   On: 07/08/2024 08:35   DG Chest Port 1 View Result Date: 07/08/2024 EXAM: 1 VIEW XRAY OF THE CHEST 07/08/2024 06:31:00 AM COMPARISON: PA and lateral radiographs of the chest dated 05/26/2022. CLINICAL HISTORY: Increasing shortness of breath for 24 hours, shoulder replacement 08/01. FINDINGS: LUNGS AND PLEURA: Hazy coarse opacities present within the lung bases bilaterally, likely reflecting a combination of alveolar and interstitial edema. No pleural effusion. No pneumothorax. HEART AND MEDIASTINUM: No acute abnormality of the cardiac and mediastinal silhouettes. BONES AND SOFT TISSUES: The patient is status post bilateral reverse shoulder arthroplasties. No acute osseous abnormality. ARTIFACTS: There is a circular artifact overlying the right lower lung zone. IMPRESSION: 1. Hazy opacities in the lung bases bilaterally, likely reflecting a combination of alveolar and interstitial edema. 2. Circular artifact overlying the right lower lung zone. Electronically signed by: evalene coho 07/08/2024 07:10 AM EDT RP Workstation: [id number]   DG Shoulder Right Port Result Date: 07/05/2024 CLINICAL DATA:  Shoulder replacement EXAM: RIGHT SHOULDER - 1 VIEW COMPARISON:  None Available. FINDINGS: Status post reverse right shoulder replacement with normal alignment. Gas in the soft tissues consistent with recent surgery. Mild AC joint degenerative change IMPRESSION: Status post reverse right shoulder replacement with expected postsurgical change. Electronically Signed   By: Luke Bun M.D.   On: 07/05/2024 17:32      Subjective: Patient seen and examined at bedside today.  Hemodynamically stable.  Room air.  Denies any shortness of breath or cough or chest pain.  Feels much better.  Blood pressure stable this morning.  Eager to go home.  Medically stable for discharge  Discharge Exam: Vitals:    07/10/24 0128 07/10/24 0522  BP: 122/66 124/62  Pulse: 69 64  Resp: 16 18  Temp: 98.6 F (37 C) 97.6 F (36.4 C)  SpO2: 94% 96%   Vitals:   07/09/24 1735 07/09/24 2053 07/10/24 0128 07/10/24 0522  BP: (!) 143/62 121/68 122/66 124/62  Pulse: 86 74 69 64  Resp: 20 16 16 18   Temp: 98.1 F (36.7 C) (!) 97.5 F (36.4 C) 98.6 F (37 C) 97.6 F (36.4 C)  TempSrc: Oral Oral Oral Oral  SpO2: 99% 95% 94% 96%  Weight:      Height:        General: Pt is alert, awake, not in acute distress, obese Cardiovascular: RRR, S1/S2 +, no rubs, no gallops Respiratory: CTA bilaterally, no wheezing, no rhonchi Abdominal: Soft, NT, ND, bowel sounds + Extremities: no edema, no cyanosis    The results of significant diagnostics from this hospitalization (including imaging, microbiology, ancillary and laboratory) are listed below for reference.     Microbiology: Recent Results (from the past 240 hours)  Resp panel by RT-PCR (RSV, Flu A&B, Covid) Anterior Nasal Swab     Status: None  Collection Time: 07/08/24  6:28 AM   Specimen: Anterior Nasal Swab  Result Value Ref Range Status   SARS Coronavirus 2 by RT PCR NEGATIVE NEGATIVE Final    Comment: (NOTE) SARS-CoV-2 target nucleic acids are NOT DETECTED.  The SARS-CoV-2 RNA is generally detectable in upper respiratory specimens during the acute phase of infection. The lowest concentration of SARS-CoV-2 viral copies this assay can detect is 138 copies/mL. A negative result does not preclude SARS-Cov-2 infection and should not be used as the sole basis for treatment or other patient management decisions. A negative result may occur with  improper specimen collection/handling, submission of specimen other than nasopharyngeal swab, presence of viral mutation(s) within the areas targeted by this assay, and inadequate number of viral copies(<138 copies/mL). A negative result must be combined with clinical observations, patient history, and  epidemiological information. The expected result is Negative.  Fact Sheet for Patients:  BloggerCourse.com  Fact Sheet for Healthcare Providers:  SeriousBroker.it  This test is no t yet approved or cleared by the [place]  FDA and  has been authorized for detection and/or diagnosis of SARS-CoV-2 by FDA under an Emergency Use Authorization (EUA). This EUA will remain  in effect (meaning this test can be used) for the duration of the COVID-19 declaration under Section 564(b)(1) of the Act, 21 U.S.C.section 360bbb-3(b)(1), unless the authorization is terminated  or revoked sooner.       Influenza A by PCR NEGATIVE NEGATIVE Final   Influenza B by PCR NEGATIVE NEGATIVE Final    Comment: (NOTE) The Xpert Xpress SARS-CoV-2/FLU/RSV plus assay is intended as an aid in the diagnosis of influenza from Nasopharyngeal swab specimens and should not be used as a sole basis for treatment. Nasal washings and aspirates are unacceptable for Xpert Xpress SARS-CoV-2/FLU/RSV testing.  Fact Sheet for Patients: BloggerCourse.com  Fact Sheet for Healthcare Providers: SeriousBroker.it  This test is not yet approved or cleared by the [place]  FDA and has been authorized for detection and/or diagnosis of SARS-CoV-2 by FDA under an Emergency Use Authorization (EUA). This EUA will remain in effect (meaning this test can be used) for the duration of the COVID-19 declaration under Section 564(b)(1) of the Act, 21 U.S.C. section 360bbb-3(b)(1), unless the authorization is terminated or revoked.     Resp Syncytial Virus by PCR NEGATIVE NEGATIVE Final    Comment: (NOTE) Fact Sheet for Patients: BloggerCourse.com  Fact Sheet for Healthcare Providers: SeriousBroker.it  This test is not yet approved or cleared by the [place]  FDA and has been  authorized for detection and/or diagnosis of SARS-CoV-2 by FDA under an Emergency Use Authorization (EUA). This EUA will remain in effect (meaning this test can be used) for the duration of the COVID-19 declaration under Section 564(b)(1) of the Act, 21 U.S.C. section 360bbb-3(b)(1), unless the authorization is terminated or revoked.  Performed at [facility], [address], [place], [place] [address]      Labs: BNP (last 3 results) Recent Labs    07/08/24 0606  BNP 106.6*   Basic Metabolic Panel: Recent Labs  Lab 07/08/24 0606 07/09/24 0315 07/10/24 0507  NA 135 136 134*  K 4.2 4.0 4.3  CL 99 98 96*  CO2 26 28 28   GLUCOSE 114* 105* 106*  BUN 11 15 16   CREATININE 0.61 0.35* 0.74  CALCIUM 8.8* 8.9 8.8*  MG 1.8  --   --    Liver Function Tests: No results for input(s): AST, ALT, ALKPHOS, BILITOT, PROT, ALBUMIN in  the last 168 hours. No results for input(s): LIPASE, AMYLASE in the last 168 hours. No results for input(s): AMMONIA in the last 168 hours. CBC: Recent Labs  Lab 07/08/24 0606 07/09/24 0315  WBC 10.0 10.5  NEUTROABS 7.6  --   HGB 12.5 13.0  HCT 39.7 43.3  MCV 88.8 90.6  PLT 144* 136*   Cardiac Enzymes: No results for input(s): CKTOTAL, CKMB, CKMBINDEX, TROPONINI in the last 168 hours. BNP: Invalid input(s): POCBNP CBG: Recent Labs  Lab 07/09/24 0800 07/09/24 1128 07/09/24 1616 07/09/24 2054 07/10/24 0748  GLUCAP 92 119* 160* 154* 102*   D-Dimer No results for input(s): DDIMER in the last 72 hours. Hgb A1c Recent Labs    07/08/24 1220  HGBA1C 5.6   Lipid Profile No results for input(s): CHOL, HDL, LDLCALC, TRIG, CHOLHDL, LDLDIRECT in the last 72 hours. Thyroid function studies No results for input(s): TSH, T4TOTAL, T3FREE, THYROIDAB in the last 72 hours.  Invalid input(s): FREET3 Anemia work up No results for input(s): VITAMINB12, FOLATE, FERRITIN,  TIBC, IRON, RETICCTPCT in the last 72 hours. Urinalysis No results found for: COLORURINE, APPEARANCEUR, LABSPEC, PHURINE, GLUCOSEU, HGBUR, BILIRUBINUR, KETONESUR, PROTEINUR, UROBILINOGEN, NITRITE, LEUKOCYTESUR Sepsis Labs Recent Labs  Lab 07/08/24 0606 07/09/24 0315  WBC 10.0 10.5   Microbiology Recent Results (from the past 240 hours)  Resp panel by RT-PCR (RSV, Flu A&B, Covid) Anterior Nasal Swab     Status: None   Collection Time: 07/08/24  6:28 AM   Specimen: Anterior Nasal Swab  Result Value Ref Range Status   SARS Coronavirus 2 by RT PCR NEGATIVE NEGATIVE Final    Comment: (NOTE) SARS-CoV-2 target nucleic acids are NOT DETECTED.  The SARS-CoV-2 RNA is generally detectable in upper respiratory specimens during the acute phase of infection. The lowest concentration of SARS-CoV-2 viral copies this assay can detect is 138 copies/mL. A negative result does not preclude SARS-Cov-2 infection and should not be used as the sole basis for treatment or other patient management decisions. A negative result may occur with  improper specimen collection/handling, submission of specimen other than nasopharyngeal swab, presence of viral mutation(s) within the areas targeted by this assay, and inadequate number of viral copies(<138 copies/mL). A negative result must be combined with clinical observations, patient history, and epidemiological information. The expected result is Negative.  Fact Sheet for Patients:  BloggerCourse.com  Fact Sheet for Healthcare Providers:  SeriousBroker.it  This test is no t yet approved or cleared by the [place]  FDA and  has been authorized for detection and/or diagnosis of SARS-CoV-2 by FDA under an Emergency Use Authorization (EUA). This EUA will remain  in effect (meaning this test can be used) for the duration of the COVID-19 declaration under Section 564(b)(1) of the  Act, 21 U.S.C.section 360bbb-3(b)(1), unless the authorization is terminated  or revoked sooner.       Influenza A by PCR NEGATIVE NEGATIVE Final   Influenza B by PCR NEGATIVE NEGATIVE Final    Comment: (NOTE) The Xpert Xpress SARS-CoV-2/FLU/RSV plus assay is intended as an aid in the diagnosis of influenza from Nasopharyngeal swab specimens and should not be used as a sole basis for treatment. Nasal washings and aspirates are unacceptable for Xpert Xpress SARS-CoV-2/FLU/RSV testing.  Fact Sheet for Patients: BloggerCourse.com  Fact Sheet for Healthcare Providers: SeriousBroker.it  This test is not yet approved or cleared by the [place]  FDA and has been authorized for detection and/or diagnosis of SARS-CoV-2 by FDA under an Emergency Use Authorization (EUA). This  EUA will remain in effect (meaning this test can be used) for the duration of the COVID-19 declaration under Section 564(b)(1) of the Act, 21 U.S.C. section 360bbb-3(b)(1), unless the authorization is terminated or revoked.     Resp Syncytial Virus by PCR NEGATIVE NEGATIVE Final    Comment: (NOTE) Fact Sheet for Patients: BloggerCourse.com  Fact Sheet for Healthcare Providers: SeriousBroker.it  This test is not yet approved or cleared by the [place]  FDA and has been authorized for detection and/or diagnosis of SARS-CoV-2 by FDA under an Emergency Use Authorization (EUA). This EUA will remain in effect (meaning this test can be used) for the duration of the COVID-19 declaration under Section 564(b)(1) of the Act, 21 U.S.C. section 360bbb-3(b)(1), unless the authorization is terminated or revoked.  Performed at [facility], [address] [place], [place] [address]     Please note: You were cared for by a hospitalist during your hospital stay. Once you are discharged, your  primary care physician will handle any further medical issues. Please note that NO REFILLS for any discharge medications will be authorized once you are discharged, as it is imperative that you return to your primary care physician (or establish a relationship with a primary care physician if you do not have one) for your post hospital discharge needs so that they can reassess your need for medications and monitor your lab values.    Time coordinating discharge: 40 minutes  SIGNED:   Ivonne Mustache, MD  [facility] 07/10/2024, 10:32 AM Pager [phone]  If 7PM-7AM, please contact night-coverage [email or web address] Password [email or web address]

## 2024-07-10 NOTE — Progress Notes (Signed)
 Discharge medication delivered to patient at bedside [facility]

## 2024-07-10 NOTE — Progress Notes (Signed)
  Progress Note  Patient Name: Brooke Duncan Date of Encounter: 07/10/2024 [facility] Cardiologist: None   Interval Summary   Reports feeling good, no acute complaints Only having expected shoulder pain Went over the pathophysiology of RV dysfunction in the setting of OSA without using her CPAP She does report that she has been not using her CPAP due to multiple ortho injuries making it difficult for her to put on and plans on being compliant in the future   Vital Signs Vitals:   07/09/24 1735 07/09/24 2053 07/10/24 0128 07/10/24 0522  BP: (!) 143/62 121/68 122/66 124/62  Pulse: 86 74 69 64  Resp: 20 16 16 18   Temp: 98.1 F (36.7 C) (!) 97.5 F (36.4 C) 98.6 F (37 C) 97.6 F (36.4 C)  TempSrc: Oral Oral Oral Oral  SpO2: 99% 95% 94% 96%  Weight:      Height:        Intake/Output Summary (Last 24 hours) at 07/10/2024 0752 Last data filed at 07/10/2024 0531 Gross per 24 hour  Intake 295.07 ml  Output 3175 ml  Net -2879.93 ml      07/08/2024   12:11 PM 07/05/2024   12:37 PM 06/28/2024   10:36 AM  Last 3 Weights  Weight (lbs) 261 lb 14.5 oz 257 lb 252 lb  Weight (kg) 118.8 kg 116.574 kg 114.306 kg     Telemetry/ECG  Sinus rhythm, HR 70s - Personally Reviewed  Physical Exam  GEN: No acute distress.   Neck: No JVD Cardiac: RRR, no murmurs, rubs, or gallops.  Respiratory: Clear to auscultation bilaterally. GI: Soft, nontender, non-distended  MS: No edema  Assessment & Plan  Elevated troponin level  Troponin 355 > 262 > 222 > 213 Consistent with demand ischemia in the setting of volume overload with recent surgery  Echo showed normal LVEF  Denies any chest pain, shortness of breath today Previously on IV heparin , has been discontinued    Dyspnea Diastolic heart failure  Echo showed: EF 60-65%, mild LVH, moderately reduced RV function, normal IVC  BNP 106 CTPA showed no PE but did note mild lung parenchymal findings  She reports OSA with noncompliance  with CPAP (due to multiple ortho injuries and difficulty putting the CPAP on) Given IV Lasix  40 mg x 1 dose, urine output 3.3 L yesterday  Creatinine stable Patient appears euvolemic on exam  May benefit from PRN Lasix  at discharge  Re-educated on the importance of CPAP compliance  Will arrange for outpatient general cardiology follow up   Hypertension  Most recent BP 124/62 Reports some mild lightheadedness today Currently on losartan  100 mg daily  Currently on amlodipine  10 mg daily    Per primary Recent right shoulder surgery History of renal cancer Mildly dilated thoracic aorta  History of breast cancer Constipation Hyperlipidemia    For questions or updates, please contact [facility] Please consult [email or web address] for contact info under       Signed, Waddell DELENA Donath, PA-C

## 2024-07-10 NOTE — Progress Notes (Signed)
 Physical Therapy Treatment Patient Details Name: Brooke Duncan MRN: [id number] DOB: [birth year] Today's Date: 07/10/2024   History of Present Illness Pt is a 78 yr old female who presented to ED on 07/08/2024 due to SOB. Pt was found to have NSTEMI and started on heparin  drip 8/4. 07/05/24 pt underwent R reverse total shoulder shoulder arthroplasty. Pt PMH includes but is not limited to: UTI, B THA, L TKA, HTN, OSA on CPAP, rencal cell carcinoma s/p L nephrectomy, L total shoulder    PT Comments   Pt admitted with above diagnosis.  Pt currently with functional limitations due to the deficits listed below (see PT Problem List). Pt has transitioned out of ICU. PT arrived and pt in bathroom with nurse tech assisting pt with ADLs, pt agreeable to therapy intervention. Pt is S for transfer tasks with RW and with rollator with pt performing return demonstration with rollator brake management, pt progressed with gait tolerance to 110 feet with rollator and S with min cues. Pt inquired per activity in home setting PT recommends getting up and amb about every hr to 1.5 hr during the day and encouraged to perform 10-20 ankle pumps per hr when seated. PT recommends HH services at time of d/c and pt will have family support. Pt left seated in recliner and all needs in place.  Pt will benefit from acute skilled PT to increase their independence and safety with mobility to allow discharge.      If plan is discharge home, recommend the following: A little help with walking and/or transfers;A little help with bathing/dressing/bathroom;Assist for transportation;Assistance with cooking/housework   Can travel by private vehicle        Equipment Recommendations  None recommended by PT    Recommendations for Other Services       Precautions / Restrictions Precautions Precautions: Shoulder;Fall Type of Shoulder Precautions: NWB but per ortho note able to use walker, NO AROM/PROM at shoulder and sling at all times  except ADLS, when seated and therapy exercises Shoulder Interventions: Shoulder sling/immobilizer Precaution Booklet Issued: Yes (comment) Recall of Precautions/Restrictions: Intact Required Braces or Orthoses: Sling Restrictions Weight Bearing Restrictions Per Provider Order: No RUE Weight Bearing Per Provider Order: Non weight bearing Other Position/Activity Restrictions: but can use walker per md note     Mobility  Bed Mobility               General bed mobility comments: pt seated in recliner when PT arrived and returned to recliner at end of session    Transfers Overall transfer level: Needs assistance Equipment used: Rollator (4 wheels) Transfers: Sit to/from Stand Sit to Stand: Supervision           General transfer comment: min cues for transfers commode and recliner    Ambulation/Gait Ambulation/Gait assistance: Supervision Gait Distance (Feet): 110 Feet Assistive device: Rollator (4 wheels) Gait Pattern/deviations: Step-through pattern, Narrow base of support Gait velocity: decreased     General Gait Details: min cues pt is able to manage rollator brakes no reports of SOB   Stairs             Wheelchair Mobility     Tilt Bed    Modified Rankin (Stroke Patients Only)       Balance Overall balance assessment: Needs assistance Sitting-balance support: Feet supported Sitting balance-Leahy Scale: Good     Standing balance support: Single extremity supported, Bilateral upper extremity supported Standing balance-Leahy Scale: Fair Standing balance comment: static standing no UE support and  no overt LOB                            Communication Communication Communication: No apparent difficulties  Cognition Arousal: Alert Behavior During Therapy: WFL for tasks assessed/performed                             Following commands: Intact      Cueing Cueing Techniques: Verbal cues  Exercises      General  Comments        Pertinent Vitals/Pain Pain Assessment Pain Assessment: 0-10 Pain Score: 5  Pain Location: R shoulder and UE Pain Descriptors / Indicators: Aching, Constant, Discomfort, Dull, Grimacing Pain Intervention(s): Limited activity within patient's tolerance, Monitored during session, Repositioned, Ice applied    Home Living                          Prior Function            PT Goals (current goals can now be found in the care plan section) Acute Rehab PT Goals Patient Stated Goal: to be able to go home and breathe PT Goal Formulation: With patient Time For Goal Achievement: 07/23/24 Potential to Achieve Goals: Good Progress towards PT goals: Progressing toward goals    Frequency    Min 3X/week      PT Plan      Co-evaluation              AM-PAC PT 6 Clicks Mobility   Outcome Measure  Help needed turning from your back to your side while in a flat bed without using bedrails?: A Little Help needed moving from lying on your back to sitting on the side of a flat bed without using bedrails?: A Little Help needed moving to and from a bed to a chair (including a wheelchair)?: A Little Help needed standing up from a chair using your arms (e.g., wheelchair or bedside chair)?: A Little Help needed to walk in hospital room?: A Little Help needed climbing 3-5 steps with a railing? : Total 6 Click Score: 16    End of Session Equipment Utilized During Treatment: Gait belt (R UE sling) Activity Tolerance: Patient tolerated treatment well;No increased pain Patient left: in chair;with call bell/phone within reach Nurse Communication: Mobility status PT Visit Diagnosis: Unsteadiness on feet (R26.81);Other abnormalities of gait and mobility (R26.89);Muscle weakness (generalized) (M62.81);Difficulty in walking, not elsewhere classified (R26.2);Pain Pain - Right/Left: Right Pain - part of body: Shoulder     Time: 8941-8877 PT Time Calculation (min)  (ACUTE ONLY): 24 min  Charges:    $Gait Training: 8-22 mins $Therapeutic Activity: 8-22 mins PT General Charges $$ ACUTE PT VISIT: 1 Visit                     [place], PT Acute Rehab    [place] VEAR Drone 07/10/2024, 12:14 PM

## 2024-07-10 NOTE — TOC Transition Note (Signed)
 Transition of Care [facility]) - Discharge Note   Patient Details  Name: Brooke Duncan MRN: [id number] Date of Birth: 1946-07-23  [facility]) CM/SW Contact:  Bascom Service, RN Phone Number: 07/10/2024, 10:54 AM   Clinical Narrative:  confirmed with rep Shawn for [facility] fax#[phone] received HHPT/OT, face to face orders.No further CM needs.     Final next level of care: Home w Home Health Services Barriers to Discharge: No Barriers Identified   Patient Goals and CMS Choice     Choice offered to / list presented to : Patient [place] ownership interest in [facility].provided to:: Patient    Discharge Placement                       Discharge Plan and Services Additional resources added to the After Visit Summary for       Post Acute Care Choice: Home Health                    HH Arranged: PT, OT [facility] Agency: Other - See comment ([facility]) Date [facility] Agency Contacted: 07/10/24 Time HH Agency Contacted: 1054 Representative spoke with at [facility] Agency: Traci/Shawn  Social Drivers of Health (SDOH) Interventions SDOH Screenings   Food Insecurity: No Food Insecurity (07/08/2024)  Housing: Low Risk  (07/08/2024)  Transportation Needs: No Transportation Needs (07/08/2024)  Utilities: Not At Risk (07/08/2024)  Depression (PHQ2-9): Medium Risk (03/10/2021)  Financial Resource Strain: Low Risk  (03/20/2024)   Received from [facility]  Physical Activity: Unknown (03/20/2024)   Received from [facility]  Social Connections: Moderately Isolated (07/08/2024)  Stress: Stress Concern Present (03/20/2024)   Received from [facility]  Tobacco Use: Medium Risk (07/09/2024)     Readmission Risk Interventions     No data to display

## 2024-07-12 ENCOUNTER — Telehealth: Payer: Self-pay | Admitting: Internal Medicine

## 2024-07-12 NOTE — Telephone Encounter (Signed)
 Patient want provider switch from Dr. Mona to Dr. Lonni.

## 2024-07-16 ENCOUNTER — Other Ambulatory Visit (HOSPITAL_COMMUNITY): Payer: Self-pay

## 2024-08-04 NOTE — Progress Notes (Unsigned)
 Cardiology Office Note:    Date:  08/08/2024   ID:  Brooke Duncan, DOB 1945/12/19, MRN [id number]  PCP:  Okey Carlin Redbird, MD   [facility] Providers Cardiologist:  Shelda Bruckner, MD     Referring MD: Okey Carlin Redbird, MD   Chief complaint: Follow up hospital visit  History of Present Illness:    Brooke Duncan is a 78 y.o. female with a hx of renal cell carcinoma s/p left nephrectomy, Hashimoto thyroiditis, hypothyroidism, OSA, HTN, presenting to the office today following hospital admission for NSTEMI.  Patient presented to the ED on 07/08/2024 c/o SOB and orthopnea with O2 sats at 75% at home.  Recently had right shoulder surgery the Friday before arriving to the ED.  Troponin elevation to 355>> 262>> 222>> 213, BNP 106, chest x-ray showed no masses or consolidation or significant edema but patchy areas of atelectasis were noted, EKG sinus rhythm with PACs at 94 bpm, CT angio PE was negative for PE, cardiology recommended admission and obtaining an echo.  Echo on 07/08/2024 showed LVEF 60-65%, normal LV function, no RWMA, mild concentric left ventricular hypertrophy.  Tethering of the apex and free wall akinesis consistent with RV dysfunction, RV systolic function moderately reduced and enlarged in size.  Aortic valve sclerosis present no evidence of aortic stenosis.  Patient was admitted, diuresed with symptom improvement, discharged 07/10/2024 to follow-up with cardiology.  Presents to the clinic with her nephew today, appears to be doing well from a cardiovascular standpoint.  States shortly after leaving the hospital she developed right lower extremity edema and redness, followed up with urgent care and got started on Keflex  for cellulitis.  Required a second dose of Keflex , no improvement.  Was evaluated yesterday and got started on doxycycline , took yesterday's dose and believes it may be slightly better today.  Erythema, warmth, swelling, encompassing right foot  extending up to just below right knee, +2 edema RLE.  Denies fever, cough, shortness of breath, DOE, palpitations, nausea, vomiting, dark/tarry/bloody stools, dizziness, lightheadedness, near syncope.  Reports she felt a sharp chest pain just underneath her left breast area that comes and goes, has occurred 3 times since leaving the hospital.  Not associated with eating, exertion, deep breathing.  Last less than 60 seconds when they occur, localized, does not radiate, no associated symptoms like shortness of breath, cough, nausea with this.  States she has taken her Lasix  1 time since leaving the hospital for a weight gain of 5 pounds with associated bilateral lower extremity edema.     Past Medical History:  Diagnosis Date   Acquired solitary kidney    right side 2014   Arthritis    rt knee rt shoulder   Bronchitis 05/08/2023   Cancer (HCC)    renal cell carcinoma- left kidney removed and left breast cancer   Complication of anesthesia    slow to wake, hard to take a deep breath with 1 surgery several yrs ago   Foley catheter in place    Frequency of urination    Heart murmur    per told by pcp approx. 02/ 2022 heard a very faint murmur, told did need work-up done at this time   History of COVID-19 2021   all symptoms resolved   History of Hashimoto thyroiditis    s/p  total thyroidectomy 1995   History of hypercalcemia    s/p  parathryoidectomy 07/ 2021   History of kidney stones    History of renal cell  carcinoma 2014   s/p  left nephrectomy, pt stated no other treatment   Hypertension    followed by pcp   Hypothyroidism, postsurgical 1995   followed by pcp in , moved from [place]  01/ 2021   OSA on CPAP    Thickened endometrium    UTI (urinary tract infection)    finished keflex  08-12-2022   Wears glasses     Past Surgical History:  Procedure Laterality Date   BREAST BIOPSY Left 08/24/2021   BREAST LUMPECTOMY WITH RADIOACTIVE SEED LOCALIZATION Left 07/14/2022    Procedure: LEFT BREAST LUMPECTOMY WITH RADIOACTIVE SEED LOCALIZATION;  Surgeon: Ebbie Cough, MD;  Location: [facility];  Service: General;  Laterality: Left;   COLON SURGERY     10/2022 at [facility]   CYSTOSCOPY  08/04/2022   in dr winter office   CYSTOSCOPY WITH BIOPSY N/A 09/02/2022   Procedure: CYSTOSCOPY WITH BLADDER BIOPSY;  Surgeon: Devere Lonni Righter, MD;  Location: [facility];  Service: Urology;  Laterality: N/A;  ONLY NEEDS 30 MIN   HYSTEROSCOPY WITH D & C N/A 04/08/2021   Procedure: DILATATION AND CURETTAGE /HYSTEROSCOPY, EXCISION OF VAGINAL LESION;  Surgeon: Darcel Pool, MD;  Location: [facility];  Service: Gynecology;  Laterality: N/A;   LAPAROSCOPIC CHOLECYSTECTOMY  2005   NEPHRECTOMY Left 2014   PARATHYROIDECTOMY  07/ 2021  in [place]    REVERSE SHOULDER ARTHROPLASTY Left 06/16/2023   Procedure: REVERSE SHOULDER ARTHROPLASTY;  Surgeon: Kay Kemps, MD;  Location: [facility];  Service: Orthopedics;  Laterality: Left;   REVERSE SHOULDER ARTHROPLASTY Right 07/05/2024   Procedure: ARTHROPLASTY, SHOULDER, TOTAL, REVERSE;  Surgeon: Kay Kemps, MD;  Location: [facility];  Service: Orthopedics;  Laterality: Right;   TONSILLECTOMY  age 65   TOTAL HIP ARTHROPLASTY Bilateral left 2015;  right 2009   TOTAL KNEE ARTHROPLASTY Left 2014   TOTAL THYROIDECTOMY Bilateral 1995   TRIGGER FINGER RELEASE Right 01/24/2024   Procedure: RELEASE TRIGGER FINGER/A-1 PULLEY MIDDLE AND RING;  Surgeon: Romona Harari, MD;  Location: [facility];  Service: Orthopedics;  Laterality: Right;  local   WISDOM TOOTH EXTRACTION Bilateral     Current Medications: Current Meds  Medication Sig   acetaminophen  (TYLENOL ) 500 MG tablet Take 2 tablets (1,000 mg total) by mouth every 6 (six) hours as needed. (Patient taking differently: Take 1,000 mg by mouth every 6 (six) hours as needed for mild pain (pain score 1-3) or moderate pain (pain  score 4-6).)   albuterol  (VENTOLIN  HFA) 108 (90 Base) MCG/ACT inhaler Inhale 2 puffs into the lungs every 4 (four) hours as needed for shortness of breath or wheezing.   amLODipine  (NORVASC ) 10 MG tablet Take 1 tablet (10 mg total) by mouth daily.   Ascorbic Acid  (VITAMIN C  PO) Take 1 tablet by mouth daily.   Cholecalciferol  (VITAMIN D3 SUPER STRENGTH) 50 MCG (2000 UT) CAPS Take 2,000 Units by mouth daily.   clotrimazole  (LOTRIMIN ) 1 % cream Apply 1 application  topically 2 (two) times daily as needed (irritation).   CRANBERRY PO Take 2 capsules by mouth daily.   D-MANNOSE PO Take 4 capsules by mouth daily.   doxycycline  (VIBRAMYCIN ) 100 MG capsule 1 capsule 2 (two) times daily.   furosemide  (LASIX ) 40 MG tablet Take 1 tablet (40 mg total) by mouth daily as needed. Take for more than 5 pounds weight gain or lower extremity edema   HYDROcodone -acetaminophen  (NORCO/VICODIN) 5-325 MG tablet Take 1 tablet by mouth every 6 (six) hours as  needed for moderate pain (pain score 4-6) or severe pain (pain score 7-10).   hydrocortisone  2.5 % cream Apply 1 Application topically 2 (two) times daily as needed (irritation).   letrozole  (FEMARA ) 2.5 MG tablet Take 1 tablet (2.5 mg total) by mouth daily.   levothyroxine  (SYNTHROID ) 150 MCG tablet Take 150 mcg by mouth daily before breakfast.   losartan  (COZAAR ) 100 MG tablet Take 100 mg by mouth daily.   nystatin  cream (MYCOSTATIN ) Apply topically 2 (two) times daily.   Probiotic Product (PROBIOTIC DAILY PO) Take 1 capsule by mouth daily.   simvastatin  (ZOCOR ) 10 MG tablet Take 10 mg by mouth at bedtime.   traMADol  (ULTRAM ) 50 MG tablet Take 1 tablet (50 mg total) by mouth every 6 (six) hours as needed.   vitamin B-12 (CYANOCOBALAMIN ) 1000 MCG tablet Take 1,000 mcg by mouth daily.     Allergies:   Wound dressing adhesive and Tape   Social History   Socioeconomic History   Marital status: Widowed    Spouse name: Not on file   Number of children: Not on  file   Years of education: Not on file   Highest education level: Not on file  Occupational History   Not on file  Tobacco Use   Smoking status: Former    Current packs/day: 0.00    Average packs/day: 1.5 packs/day for 12.0 years (18.0 ttl pk-yrs)    Types: Cigarettes    Start date: 04/02/1962    Quit date: 04/02/1974    Years since quitting: 50.3   Smokeless tobacco: Never  Vaping Use   Vaping status: Never Used  Substance and Sexual Activity   Alcohol use: Never   Drug use: Never   Sexual activity: Not Currently    Birth control/protection: Post-menopausal  Other Topics Concern   Not on file  Social History Narrative   Not on file   Social Drivers of Health   Financial Resource Strain: Low Risk  (03/20/2024)   Received from [facility]   Overall Financial Resource Strain (CARDIA)    Difficulty of Paying Living Expenses: Not very hard  Food Insecurity: No Food Insecurity (07/08/2024)   Hunger Vital Sign    Worried About Running Out of Food in the Last Year: Never true    Ran Out of Food in the Last Year: Never true  Transportation Needs: No Transportation Needs (07/08/2024)   PRAPARE - Administrator, Civil Service (Medical): No    Lack of Transportation (Non-Medical): No  Physical Activity: Unknown (03/20/2024)   Received from [facility]   Exercise Vital Sign    On average, how many days per week do you engage in moderate to strenuous exercise (like a brisk walk)?: 0 days    Minutes of Exercise per Session: Not on file  Stress: Stress Concern Present (03/20/2024)   Received from [facility] - Occupational Stress Questionnaire    Feeling of Stress : To some extent  Social Connections: Moderately Isolated (07/08/2024)   Social Connection and Isolation Panel    Frequency of Communication with Friends and Family: More than three times a week    Frequency of Social Gatherings with Friends and Family: Twice a week     Attends Religious Services: More than 4 times per year    Active Member of [employer] or Organizations: No    Attends Banker Meetings: Never    Marital Status: Widowed     Family  History: The patient's family history includes Heart attack in her brother, father, and mother; Melanoma in her father; Prostate cancer in her father.  ROS:   Please see the history of present illness.    All other systems reviewed and are negative.  EKGs/Labs/Other Studies Reviewed:    The following studies were reviewed today:  Recent Labs: 07/08/2024: B Natriuretic Peptide 106.6; Magnesium  1.8 07/09/2024: Hemoglobin 13.0; Platelets 136 07/10/2024: BUN 16; Creatinine, Ser 0.74; Potassium 4.3; Sodium 134  Recent Lipid Panel No results found for: CHOL, TRIG, HDL, CHOLHDL, VLDL, LDLCALC, LDLDIRECT   Physical Exam:    VS:  BP 118/60   Pulse 73   Ht 5' 6 (1.676 m)   Wt 260 lb (117.9 kg)   SpO2 96%   BMI 41.97 kg/m     Wt Readings from Last 3 Encounters:  08/08/24 260 lb (117.9 kg)  07/08/24 261 lb 14.5 oz (118.8 kg)  07/05/24 257 lb (116.6 kg)    GEN:  Well nourished, well developed in no acute distress HEENT: Normal NECK: No carotid bruits CARDIAC: S1-S2 normal, RRR, systolic murmur present, no rubs, gallops RESPIRATORY:  Clear to auscultation without rales, wheezing or rhonchi  MUSCULOSKELETAL: RLE 2+ edema, erythema encompassing entire foot with warmth extending circumferentially to just below right knee.  No deformity  SKIN: Warm and dry NEUROLOGIC:  Alert and oriented x 3 PSYCHIATRIC:  Normal affect   ASSESSMENT:    1. Edema of right lower extremity   2. NSTEMI (non-ST elevated myocardial infarction) (HCC)   3. Acute diastolic heart failure (HCC)   4. Essential hypertension    PLAN:    In order of problems listed above:  This exam was conducted in the presence of Scot Ford, [place], who agrees with the assessment and plan created today. Edema of RLE Erythema,  warmth, swelling, encompassing right foot extending up to just below right knee, +2 edema RLE.   Denies fever, cough, shortness of breath, palpitations Considering recent shoulder surgery and hospitalization with prolonged periods of sitting, no improvement on keflex , just starting on doxy, will order bilateral venous duplex to r/o DVT If positive, will start on Eliquis 10 mg BID x 7 days, followed by 5 mg BID  Supply/demand mismatch NSTEMI  Secondary to volume overload s/p recent surgery  Flat troponins (355>> 262>> 222>> 213) not felt consistent with ischemic event  LVEF 60-65%, normal LV function Reports 3 isolated, < 60 second episodes of sharp pain under left breast area, localized and non-radiating, no exacerbating factors, no associated symptoms, no alleviating factors, not present today. Chest pain appears atypical, does not sound ischemic in nature.  Will continue to monitor, patient instructed to contact the office for further workup if this changes in nature, increases in severity/frequency, or develops associated symptoms.  Proceed to ED if these changes occur suddenly without warning.   Diastolic heart failure Echo showed tethering of the apex and free wall akinesis consistent with RV dysfunction, RV systolic function moderately reduced and enlarged in size. Taken lasix  once since D/C for weight gain of 5 lb, feels as though this needs to be taken again today. Per patient, PCP recommended to consider scheduled dosing of lasix , upon further investigation, patient says she has only taken a single dose of 40mg  lasix  since discharge 3 weeks ago, given her solitary kidney, hesitant to be too aggressive, will leave lasix  as PRN for now unless she requires more frequent dosing of lasix  in the future. Continue Lasix  40 mg as  needed for weight gain of greater than 5 pounds or lower extremity edema. Continue losartan  100 mg daily  Hypertension  Patient reports BPs normally well controlled at  home  Managed by PCP, does not have BP log, but denies any readings over/near 140/90 Continue losartan  100 mg daily Continue amlodipine  10 mg daily, may consider lessening dose or changing in the future if lower extremity edema worsens  Hyperlipidemia  Managed by PCP  03/15/2024: tchol 156, hdl 83, ldl 61, trig 66  Continue simvastatin  10 mg  OSA with CPAP  Reports strict compliance with CPAP since recovery from shoulder surgery.   Follow up with Dr. WENDI Bruckner in 4 months, or sooner if symptoms change/worsen     Medication Adjustments/Labs and Tests Ordered: Current medicines are reviewed at length with the patient today.  Concerns regarding medicines are outlined above.  Orders Placed This Encounter  Procedures   VAS US  LOWER EXTREMITY VENOUS (DVT)   No orders of the defined types were placed in this encounter.   Patient Instructions  Medication Instructions:  Your physician recommends that you continue on your current medications as directed. Please refer to the Current Medication list given to you today.  *If you need a refill on your cardiac medications before your next appointment, please call your pharmacy*  Lab Work: None ordered.  If you have labs (blood work) drawn today and your tests are completely normal, you will receive your results only by: MyChart Message (if you have MyChart) OR A paper copy in the mail If you have any lab test that is abnormal or we need to change your treatment, we will call you to review the results.  Testing/Procedures: Your physician has requested that you have a lower or upper extremity venous duplex. This test is an ultrasound of the veins in the legs or arms. It looks at venous blood flow that carries blood from the heart to the legs or arms. Allow one hour for a Lower Venous exam. Allow thirty minutes for an Upper Venous exam. There are no restrictions or special instructions.  Please note: We ask at that you not bring children  with you during ultrasound (echo/ vascular) testing. Due to room size and safety concerns, children are not allowed in the ultrasound rooms during exams. Our front office staff cannot provide observation of children in our lobby area while testing is being conducted. An adult accompanying a patient to their appointment will only be allowed in the ultrasound room at the discretion of the ultrasound technician under special circumstances. We apologize for any inconvenience.   Follow-Up: At [facility], you and your health needs are our priority.  As part of our continuing mission to provide you with exceptional heart care, our providers are all part of one team.  This team includes your primary Cardiologist (physician) and Advanced Practice Providers or APPs (Physician Assistants and Nurse Practitioners) who all work together to provide you with the care you need, when you need it.  Your next appointment:   4 months with Dr Bruckner       Signed, Miriam FORBES Shams, NP  08/08/2024 9:51 AM    [facility]

## 2024-08-06 ENCOUNTER — Encounter (HOSPITAL_BASED_OUTPATIENT_CLINIC_OR_DEPARTMENT_OTHER): Payer: Self-pay

## 2024-08-08 ENCOUNTER — Encounter: Payer: Self-pay | Admitting: Physician Assistant

## 2024-08-08 ENCOUNTER — Ambulatory Visit: Attending: Physician Assistant | Admitting: Emergency Medicine

## 2024-08-08 VITALS — BP 118/60 | HR 73 | Ht 66.0 in | Wt 260.0 lb

## 2024-08-08 DIAGNOSIS — R600 Localized edema: Secondary | ICD-10-CM | POA: Insufficient documentation

## 2024-08-08 DIAGNOSIS — I5031 Acute diastolic (congestive) heart failure: Secondary | ICD-10-CM | POA: Insufficient documentation

## 2024-08-08 DIAGNOSIS — I10 Essential (primary) hypertension: Secondary | ICD-10-CM | POA: Insufficient documentation

## 2024-08-08 DIAGNOSIS — E785 Hyperlipidemia, unspecified: Secondary | ICD-10-CM | POA: Insufficient documentation

## 2024-08-08 DIAGNOSIS — I214 Non-ST elevation (NSTEMI) myocardial infarction: Secondary | ICD-10-CM | POA: Diagnosis not present

## 2024-08-08 NOTE — Patient Instructions (Addendum)
 Medication Instructions:  Your physician recommends that you continue on your current medications as directed. Please refer to the Current Medication list given to you today.  *If you need a refill on your cardiac medications before your next appointment, please call your pharmacy*  Lab Work: None ordered.  If you have labs (blood work) drawn today and your tests are completely normal, you will receive your results only by: MyChart Message (if you have MyChart) OR A paper copy in the mail If you have any lab test that is abnormal or we need to change your treatment, we will call you to review the results.  Testing/Procedures: Your physician has requested that you have a lower or upper extremity venous duplex. This test is an ultrasound of the veins in the legs or arms. It looks at venous blood flow that carries blood from the heart to the legs or arms. Allow one hour for a Lower Venous exam. Allow thirty minutes for an Upper Venous exam. There are no restrictions or special instructions.  Please note: We ask at that you not bring children with you during ultrasound (echo/ vascular) testing. Due to room size and safety concerns, children are not allowed in the ultrasound rooms during exams. Our front office staff cannot provide observation of children in our lobby area while testing is being conducted. An adult accompanying a patient to their appointment will only be allowed in the ultrasound room at the discretion of the ultrasound technician under special circumstances. We apologize for any inconvenience.   Follow-Up: At [facility], you and your health needs are our priority.  As part of our continuing mission to provide you with exceptional heart care, our providers are all part of one team.  This team includes your primary Cardiologist (physician) and Advanced Practice Providers or APPs (Physician Assistants and Nurse Practitioners) who all work together to provide you with the care  you need, when you need it.  Your next appointment:   4 months with Dr Lonni

## 2024-08-12 ENCOUNTER — Encounter: Payer: Self-pay | Admitting: Hematology and Oncology

## 2024-08-12 ENCOUNTER — Other Ambulatory Visit: Payer: Self-pay | Admitting: Hematology and Oncology

## 2024-08-12 DIAGNOSIS — Z9889 Other specified postprocedural states: Secondary | ICD-10-CM

## 2024-08-18 NOTE — Therapy (Signed)
 OUTPATIENT PHYSICAL THERAPY SHOULDER EVALUATION   Patient Name: Brooke Duncan MRN: [id number] DOB:[birth year], 78 y.o., female Today's Date: 08/19/2024  END OF SESSION:  PT End of Session - 08/19/24 1451     Visit Number 1    Number of Visits 16    Date for PT Re-Evaluation 10/14/24    Authorization Type medicare BCN+BS supplement    Progress Note Due on Visit 10    PT Start Time 1400    PT Stop Time 1445    PT Time Calculation (min) 45 min    Activity Tolerance Patient tolerated treatment well          Past Medical History:  Diagnosis Date   Acquired solitary kidney    right side 2014   Arthritis    rt knee rt shoulder   Bronchitis 05/08/2023   Cancer (HCC)    renal cell carcinoma- left kidney removed and left breast cancer   Complication of anesthesia    slow to wake, hard to take a deep breath with 1 surgery several yrs ago   Foley catheter in place    Frequency of urination    Heart murmur    per told by pcp approx. 02/ 2022 heard a very faint murmur, told did need work-up done at this time   History of COVID-19 2021   all symptoms resolved   History of Hashimoto thyroiditis    s/p  total thyroidectomy 1995   History of hypercalcemia    s/p  parathryoidectomy 07/ 2021   History of kidney stones    History of renal cell carcinoma 2014   s/p  left nephrectomy, pt stated no other treatment   Hypertension    followed by pcp   Hypothyroidism, postsurgical 1995   followed by pcp in [place], moved from [place]  01/ 2021   OSA on CPAP    Thickened endometrium    UTI (urinary tract infection)    finished keflex  08-12-2022   Wears glasses    Past Surgical History:  Procedure Laterality Date   BREAST BIOPSY Left 08/24/2021   BREAST LUMPECTOMY WITH RADIOACTIVE SEED LOCALIZATION Left 07/14/2022   Procedure: LEFT BREAST LUMPECTOMY WITH RADIOACTIVE SEED LOCALIZATION;  Surgeon: Ebbie Cough, MD;  Location: [facility];  Service: General;   Laterality: Left;   COLON SURGERY     10/2022 at [facility]   CYSTOSCOPY  08/04/2022   in dr winter office   CYSTOSCOPY WITH BIOPSY N/A 09/02/2022   Procedure: CYSTOSCOPY WITH BLADDER BIOPSY;  Surgeon: Devere Lonni Righter, MD;  Location: [facility];  Service: Urology;  Laterality: N/A;  ONLY NEEDS 30 MIN   HYSTEROSCOPY WITH D & C N/A 04/08/2021   Procedure: DILATATION AND CURETTAGE /HYSTEROSCOPY, EXCISION OF VAGINAL LESION;  Surgeon: Darcel Pool, MD;  Location: [facility];  Service: Gynecology;  Laterality: N/A;   LAPAROSCOPIC CHOLECYSTECTOMY  2005   NEPHRECTOMY Left 2014   PARATHYROIDECTOMY  07/ 2021  in [place]    REVERSE SHOULDER ARTHROPLASTY Left 06/16/2023   Procedure: REVERSE SHOULDER ARTHROPLASTY;  Surgeon: Kay Kemps, MD;  Location: [facility];  Service: Orthopedics;  Laterality: Left;   REVERSE SHOULDER ARTHROPLASTY Right 07/05/2024   Procedure: ARTHROPLASTY, SHOULDER, TOTAL, REVERSE;  Surgeon: Kay Kemps, MD;  Location: [facility];  Service: Orthopedics;  Laterality: Right;   TONSILLECTOMY  age 24   TOTAL HIP ARTHROPLASTY Bilateral left 2015;  right 2009   TOTAL KNEE ARTHROPLASTY Left 2014   TOTAL THYROIDECTOMY Bilateral 1995  TRIGGER FINGER RELEASE Right 01/24/2024   Procedure: RELEASE TRIGGER FINGER/A-1 PULLEY MIDDLE AND RING;  Surgeon: Romona Harari, MD;  Location: [facility];  Service: Orthopedics;  Laterality: Right;  local   WISDOM TOOTH EXTRACTION Bilateral    Patient Active Problem List   Diagnosis Date Noted   Primary hypertension 07/09/2024   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) 07/09/2024   OSA (obstructive sleep apnea) 07/09/2024   Demand ischemia of myocardium (HCC) 07/09/2024   NSTEMI (non-ST elevated myocardial infarction) (HCC) 07/08/2024   S/P shoulder replacement, right 07/05/2024   H/O total shoulder replacement, left 06/16/2023   Colonic mass 11/03/2022   Malignant neoplasm of  upper-outer quadrant of left breast in female, estrogen receptor positive (HCC) 08/27/2021   Abnormal ultrasound of endometrium 04/08/2021    PCP: Dr Carlin Dale Gull  REFERRING PROVIDER: Dr Marcey Her   REFERRING DIAG: R reverse total shoulder   THERAPY DIAG:  Other symptoms and signs involving the musculoskeletal system - Plan: PT plan of care cert/re-cert  Muscle weakness (generalized) - Plan: PT plan of care cert/re-cert  Chronic right shoulder pain - Plan: PT plan of care cert/re-cert  Abnormal posture - Plan: PT plan of care cert/re-cert  Rationale for Evaluation and Treatment: Rehabilitation  ONSET DATE: 07/05/24  SUBJECTIVE:                                                                                                                                                                                      SUBJECTIVE STATEMENT: Patient underwent R reverse total shoulder 07/05/24 but ended up back in the hospital 07/08/24 with difficulty breathing. She was diagnosed with slightly enlarged heart. She will be followed by cardiology for cardiac problems. She has had cellulitis for R LE. She continues to have problems with retaining fluids in bilat LE's, R > L which is bing followed by PCP, Dr Gull. She will have a reflux vascular test 11/26/24.  Her R shoulder is doing well. She has done better with the R shoulder replacement than the L TSA. She is 6 weeks 3 days post op. Xrays at follow up last week looked good     Hand dominance: Right  PERTINENT HISTORY: Chronic LBP; Arthritis; HTN; gall bladder removed; L TKA 2014; L posterior hip pain; L shoulder pain; L reverse TSA 06/25/23; L breast cancer continues on estrogen inhibitor treatment; chronic foley catheter   PAIN:  Are you having pain? Yes: NPRS scale: 1-2/10; best 0/10  Pain location: R shoulder  Pain description: nagging, aching, sore    Aggravating factors: using arm  Relieving factors: rubbing, ice   PRECAUTIONS:  Shoulder reverse total  shoulder   RED FLAGS: None   WEIGHT BEARING RESTRICTIONS: Yes no weight bearing   FALLS:  Has patient fallen in last 6 months? No  LIVING ENVIRONMENT: Lives with: lives alone Lives in: House/apartment Stairs: Yes: External: 1 steps; none Has following equipment at home: Vannie - 4 wheeled, Shower bench, bed side commode, and Grab bars  OCCUPATION: Retired from office work  Household chores; cooking; Pharmacologist; reading; tv   PLOF: Independent with basic ADLs  PATIENT GOALS:more ROM in R shoulder; strength R UE  NEXT MD VISIT: 09/24/24  OBJECTIVE:  Note: Objective measures were completed at Evaluation unless otherwise noted.  DIAGNOSTIC FINDINGS:  Xray R shoulder 07/06/23: Status post reverse right shoulder replacement with normal alignment. Gas in the soft tissues consistent with recent surgery. Mild AC joint degenerative change   IMPRESSION: Status post reverse right shoulder replacement with expected postsurgical change.  PATIENT SURVEYS:  PSFS: THE PATIENT SPECIFIC FUNCTIONAL SCALE  Place score of 0-10 (0 = unable to perform activity and 10 = able to perform activity at the same level as before injury or problem)  Activity Date: 08/19/24    Reaching back or behind back  0    2. Reaching across body to put seat belt on 1    3. Reaching to top of head  6    4. Cutting food  5    Total Score 12      Total Score = Sum of activity scores/number of activities 12/4 = 3  Minimally Detectable Change: 3 points (for single activity); 2 points (for average score)  [facility] (nd). The Patient Specific Functional Scale . Retrieved from [email or web address]   COGNITION: Overall cognitive status: Within functional limits for tasks assessed     SENSATION: WFL  POSTURE: Head forward; shoulders rounded and elevated; head of the humerus anterior in orientation  UPPER EXTREMITY ROM:    Active ROM Right eval Left eval  Shoulder flexion 94 122  Shoulder extension    Shoulder abduction 98 137  Shoulder adduction    Shoulder internal rotation Lateral hip  Buttocks   Shoulder external rotation 30 30  Elbow flexion    Elbow extension    Wrist flexion    Wrist extension    Wrist ulnar deviation    Wrist radial deviation    Wrist pronation    Wrist supination    (Blank rows = not tested)  UPPER EXTREMITY MMT: not tested resistively due to surgery   MMT Right eval Left eval  Shoulder flexion    Shoulder extension    Shoulder abduction    Shoulder adduction    Shoulder internal rotation    Shoulder external rotation    Middle trapezius    Lower trapezius    Elbow flexion    Elbow extension    Wrist flexion    Wrist extension    Wrist ulnar deviation    Wrist radial deviation    Wrist pronation    Wrist supination    Grip strength (lbs)    (Blank rows = not tested)  PALPATION:  Muscular tightness R shoulder girdle - upper trap, leveator; pecs; deltoid; biceps  TREATMENT DATE: 08/19/24   PATIENT EDUCATION: Education details: POC; HEP  Person educated: Patient Education method: Programmer, multimedia, Demonstration, Actor cues, Verbal cues, and Handouts Education comprehension: verbalized understanding, returned demonstration, verbal cues required, tactile cues required, and needs further education  HOME EXERCISE PROGRAM: Access Code: [email or web address]  - exercises from prior treatment following L reverse TSA - exercises issued today noted in bold type  URL: [email or web address] Date: 08/19/2024 Prepared by: Leanard Dimaio  Exercises - Circular Shoulder Pendulum with Table Support  - 3-4 x daily - 7 x weekly - 1 sets - 20-30 reps - Seated Shoulder Flexion AAROM with Pulley Behind  - 2 x daily - 7 x weekly - 1 sets - 10 reps - 10  sec  hold - Seated Shoulder Scaption AAROM with Pulley at Side  - 2 x daily - 7 x weekly - 1 sets - 10 reps - 10sec  hold - Seated Scapular Retraction  - 2 x daily - 7 x weekly - 1-2 sets - 10 reps - 10 sec  hold - Seated Bilateral Shoulder Flexion Towel Slide at Table Top  - 2 x daily - 7 x weekly - 1 sets - 5-10 reps - 10 sec  hold - Seated Shoulder External Rotation AAROM with Cane and Hand in Neutral  - 2 x daily - 7 x weekly - 1 sets - 5-10 reps - 5-10 sec  hold - Isometric Shoulder Extension at Wall  - 2 x daily - 7 x weekly - 1 sets - 5-10 reps - 5 sec  hold - Standing Isometric Shoulder Abduction with Doorway - Arm Bent  - 2 x daily - 7 x weekly - 1 sets - 5-10 reps - 5 sec  hold - Standing Isometric Shoulder External Rotation with Doorway  - 2 x daily - 7 x weekly - 1 sets - 5-10 reps - 5 sec  hold - Standing Isometric Shoulder Internal Rotation at Doorway  - 2 x daily - 7 x weekly - 1 sets - 5-10 reps - 5 sec  hold - Standing Bilateral Low Shoulder Row with Anchored Resistance  - 1-2 x daily - 7 x weekly - 1-2 sets - 10 reps - 2-3 sec  hold - Shoulder extension with resistance - Neutral  - 1-2 x daily - 7 x weekly - 1-2 sets - 10 reps - 3-5 sec  hold - Seated Sidebending  - 2 x daily - 7 x weekly - 1 sets - 3-5 reps - 15-20sec  hold - Seated Trunk Rotation - Arms Crossed  - 2 x daily - 7 x weekly - 1 sets - 3-5 reps - 5-10sec  hold - Supine Alternating Shoulder Flexion  - 2 x daily - 7 x weekly - 1-2 sets - 10 reps - 2 sec  hold - Supine Single Arm Shoulder Protraction  - 2 x daily - 7 x weekly - 1 sets - 10-15 reps -   hold - Supine Shoulder Rhythmic Stabilization- Horizontal Abduction/Adduction  - 2 x daily - 7 x weekly - 1 sets - 10-20 reps - Shoulder External Rotation and Scapular Retraction with Resistance  - 2 x daily - 7 x weekly - 1 sets - 10 reps - 3-5 sec  hold - Standing Shoulder Flexion to 90 Degrees  - 2 x daily - 7 x weekly - 1 sets - 10 reps - 3 sec  hold - Standing  Shoulder Scaption  - 2 x daily - 7 x weekly -  1 sets - 10 reps - 3 sec  hold - Supine Shoulder Rhythmic Stabilization- Horizontal Abduction/Adduction  - 2 x daily - 7 x weekly - 1 sets - 5-10 reps - Shoulder External Rotation with Anchored Resistance  - 2 x daily - 7 x weekly - 1-2 sets - 10 reps - 3 sec  hold - Shoulder Internal Rotation with Resistance  - 2 x daily - 7 x weekly - 2 sets - 10 reps - 3 sec  hold - Shoulder External Rotation and Scapular Retraction with Resistance  - 2 x daily - 7 x weekly - 1 sets - 10 reps - 3-5 sec  hold  ASSESSMENT:  CLINICAL IMPRESSION: Patient is a 78 y.o. female who was seen today for physical therapy evaluation and treatment s/p R reverse TSA 07/05/24. She presents with poor posture and alignment; limited shoulder ROM, strength, function; pain on an intermittent basis; limited functional and ADL activity tolerance. Patient will benefit from PT to address problems identified.   OBJECTIVE IMPAIRMENTS: Abnormal gait, decreased activity tolerance, decreased balance, decreased mobility, decreased ROM, decreased strength, increased fascial restrictions, increased muscle spasms, impaired UE functional use, improper body mechanics, postural dysfunction, obesity, and pain.   ACTIVITY LIMITATIONS: carrying, lifting, bending, sitting, standing, squatting, sleeping, bathing, toileting, dressing, reach over head, and hygiene/grooming  PARTICIPATION LIMITATIONS: meal prep, cleaning, laundry, driving, shopping, and community activity  PERSONAL FACTORS: Behavior pattern, Fitness, Past/current experiences, Time since onset of injury/illness/exacerbation, and comorbidities as noted above are also affecting patient's functional outcome.   REHAB POTENTIAL: Good  CLINICAL DECISION MAKING: Evolving/moderate complexity  EVALUATION COMPLEXITY: Moderate   GOALS: Goals reviewed with patient? Yes  SHORT TERM GOALS: Target date: 09/16/2024   Independent in initial HEP   Baseline: Goal status: INITIAL  2.  Patient reports ability to cur food with minimal to no difficulty  Baseline:  Goal status: INITIAL   LONG TERM GOALS: Target date: 10/14/2024   Patient demonstrates increase in AROM R shoulder to equal or greater than AROM L shoulder  Baseline:  Goal status: INITIAL  2.  Patient demonstrates 3/5 to 4/5 strength to use R UE for functional activities  Baseline:  Goal status: INITIAL  3.  Patient reports ability to fasten and unfasten seatbelt in care with minimal difficulty  Baseline:  Goal status: INITIAL  4.  Patient demonstrates ability to reach behind back and to head level for personal care  Baseline:  Goal status: INITIAL  5.  INCREASE PSFS: THE PATIENT SPECIFIC FUNCTIONAL SCALE SCORE BY 2 POINTS   Place score of 0-10 (0 = unable to perform activity and 10 = able to perform activity at the same level as before injury or problem)  Activity Date: 08/19/24    Reaching back or behind back  0    2. Reaching across body to put seat belt on 1    3. Reaching to top of head  6    4. Cutting food  5    Total Score 12      Total Score = Sum of activity scores/number of activities 12/4 = 3  Minimally Detectable Change: 3 points (for single activity); 2 points (for average score) Baseline: 3 Goal status: INITIAL  6.  Independent in advanced HEP  Baseline:  Goal status: INITIAL  PLAN:  PT FREQUENCY: 2x/week  PT DURATION: 8 weeks  PLANNED INTERVENTIONS: 97164- PT Re-evaluation, 97110-Therapeutic exercises, 97530- Therapeutic activity, [id number]- Neuromuscular re-education, 97535- Self Care, [id number]- Manual therapy, Patient/Family education, Taping, and  Joint mobilization  PLAN FOR NEXT SESSION: review and progress exercises; continue with education and home instruction; manual work and modalities as indicated    Owain Eckerman P Brach Birdsall, PT 08/19/2024, 3:36 PM

## 2024-08-19 ENCOUNTER — Other Ambulatory Visit: Payer: Self-pay

## 2024-08-19 ENCOUNTER — Encounter: Payer: Self-pay | Admitting: Rehabilitative and Restorative Service Providers"

## 2024-08-19 ENCOUNTER — Ambulatory Visit: Payer: Self-pay | Attending: Orthopedic Surgery | Admitting: Rehabilitative and Restorative Service Providers"

## 2024-08-19 DIAGNOSIS — M6281 Muscle weakness (generalized): Secondary | ICD-10-CM | POA: Insufficient documentation

## 2024-08-19 DIAGNOSIS — M25511 Pain in right shoulder: Secondary | ICD-10-CM | POA: Insufficient documentation

## 2024-08-19 DIAGNOSIS — R293 Abnormal posture: Secondary | ICD-10-CM | POA: Diagnosis present

## 2024-08-19 DIAGNOSIS — G8929 Other chronic pain: Secondary | ICD-10-CM | POA: Insufficient documentation

## 2024-08-19 DIAGNOSIS — R29898 Other symptoms and signs involving the musculoskeletal system: Secondary | ICD-10-CM | POA: Diagnosis present

## 2024-08-20 ENCOUNTER — Inpatient Hospital Stay: Payer: Medicare Other | Admitting: Hematology and Oncology

## 2024-08-20 ENCOUNTER — Ambulatory Visit (HOSPITAL_COMMUNITY)

## 2024-08-21 ENCOUNTER — Ambulatory Visit: Admitting: Rehabilitative and Restorative Service Providers"

## 2024-08-21 DIAGNOSIS — M6281 Muscle weakness (generalized): Secondary | ICD-10-CM

## 2024-08-21 DIAGNOSIS — R29898 Other symptoms and signs involving the musculoskeletal system: Secondary | ICD-10-CM

## 2024-08-21 DIAGNOSIS — G8929 Other chronic pain: Secondary | ICD-10-CM

## 2024-08-21 DIAGNOSIS — R293 Abnormal posture: Secondary | ICD-10-CM

## 2024-08-21 NOTE — Therapy (Addendum)
 OUTPATIENT PHYSICAL THERAPY SHOULDER EVALUATION   Patient Name: Brooke Duncan MRN: [id number] DOB:10/13/1946, 78 y.o., female Today's Date: 08/21/2024  END OF SESSION:  PT End of Session - 08/21/24 1319     Visit Number 2    Number of Visits 16    Date for PT Re-Evaluation 10/14/24    Authorization Type medicare BCN+BS supplement    Progress Note Due on Visit 10    PT Start Time 1315    PT Stop Time 1400    PT Time Calculation (min) 45 min    Activity Tolerance Patient tolerated treatment well          Past Medical History:  Diagnosis Date   Acquired solitary kidney    right side 2014   Arthritis    rt knee rt shoulder   Bronchitis 05/08/2023   Cancer (HCC)    renal cell carcinoma- left kidney removed and left breast cancer   Complication of anesthesia    slow to wake, hard to take a deep breath with 1 surgery several yrs ago   Foley catheter in place    Frequency of urination    Heart murmur    per told by pcp approx. 02/ 2022 heard a very faint murmur, told did need work-up done at this time   History of COVID-19 2021   all symptoms resolved   History of Hashimoto thyroiditis    s/p  total thyroidectomy 1995   History of hypercalcemia    s/p  parathryoidectomy 07/ 2021   History of kidney stones    History of renal cell carcinoma 2014   s/p  left nephrectomy, pt stated no other treatment   Hypertension    followed by pcp   Hypothyroidism, postsurgical 1995   followed by pcp in , moved from [place]  01/ 2021   OSA on CPAP    Thickened endometrium    UTI (urinary tract infection)    finished keflex  08-12-2022   Wears glasses    Past Surgical History:  Procedure Laterality Date   BREAST BIOPSY Left 08/24/2021   BREAST LUMPECTOMY WITH RADIOACTIVE SEED LOCALIZATION Left 07/14/2022   Procedure: LEFT BREAST LUMPECTOMY WITH RADIOACTIVE SEED LOCALIZATION;  Surgeon: Ebbie Cough, MD;  Location: [facility];  Service: General;   Laterality: Left;   COLON SURGERY     10/2022 at [facility]   CYSTOSCOPY  08/04/2022   in dr winter office   CYSTOSCOPY WITH BIOPSY N/A 09/02/2022   Procedure: CYSTOSCOPY WITH BLADDER BIOPSY;  Surgeon: Devere Lonni Righter, MD;  Location: [facility];  Service: Urology;  Laterality: N/A;  ONLY NEEDS 30 MIN   HYSTEROSCOPY WITH D & C N/A 04/08/2021   Procedure: DILATATION AND CURETTAGE /HYSTEROSCOPY, EXCISION OF VAGINAL LESION;  Surgeon: Darcel Pool, MD;  Location: [facility];  Service: Gynecology;  Laterality: N/A;   LAPAROSCOPIC CHOLECYSTECTOMY  2005   NEPHRECTOMY Left 2014   PARATHYROIDECTOMY  07/ 2021  in [place]    REVERSE SHOULDER ARTHROPLASTY Left 06/16/2023   Procedure: REVERSE SHOULDER ARTHROPLASTY;  Surgeon: Kay Kemps, MD;  Location: [facility];  Service: Orthopedics;  Laterality: Left;   REVERSE SHOULDER ARTHROPLASTY Right 07/05/2024   Procedure: ARTHROPLASTY, SHOULDER, TOTAL, REVERSE;  Surgeon: Kay Kemps, MD;  Location: [facility];  Service: Orthopedics;  Laterality: Right;   TONSILLECTOMY  age 41   TOTAL HIP ARTHROPLASTY Bilateral left 2015;  right 2009   TOTAL KNEE ARTHROPLASTY Left 2014   TOTAL THYROIDECTOMY Bilateral 1995  TRIGGER FINGER RELEASE Right 01/24/2024   Procedure: RELEASE TRIGGER FINGER/A-1 PULLEY MIDDLE AND RING;  Surgeon: Romona Harari, MD;  Location: [facility];  Service: Orthopedics;  Laterality: Right;  local   WISDOM TOOTH EXTRACTION Bilateral    Patient Active Problem List   Diagnosis Date Noted   Primary hypertension 07/09/2024   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) 07/09/2024   OSA (obstructive sleep apnea) 07/09/2024   Demand ischemia of myocardium (HCC) 07/09/2024   NSTEMI (non-ST elevated myocardial infarction) (HCC) 07/08/2024   S/P shoulder replacement, right 07/05/2024   H/O total shoulder replacement, left 06/16/2023   Colonic mass 11/03/2022   Malignant neoplasm of  upper-outer quadrant of left breast in female, estrogen receptor positive (HCC) 08/27/2021   Abnormal ultrasound of endometrium 04/08/2021    PCP: Dr Carlin Dale Gull  REFERRING PROVIDER: Dr Marcey Her   REFERRING DIAG: R reverse total shoulder   THERAPY DIAG:  Other symptoms and signs involving the musculoskeletal system  Muscle weakness (generalized)  Chronic right shoulder pain  Abnormal posture  Rationale for Evaluation and Treatment: Rehabilitation  ONSET DATE: 07/05/24  SUBJECTIVE:                                                                                                                                                                                      SUBJECTIVE STATEMENT: Patient is 6 weeks, 5 days post reverse TSA. Patient reports that she is doing well and exercises went well at home.   Eval: Patient underwent R reverse total shoulder 07/05/24 but ended up back in the hospital 07/08/24 with difficulty breathing. She was diagnosed with slightly enlarged heart. She will be followed by cardiology for cardiac problems. She has had cellulitis for R LE. She continues to have problems with retaining fluids in bilat LE's, R > L which is bing followed by PCP, Dr Gull. She will have a reflux vascular test 11/26/24.  Her R shoulder is doing well. She has done better with the R shoulder replacement than the L TSA. She is 6 weeks 3 days post op. Xrays at follow up last week looked good     Hand dominance: Right  PERTINENT HISTORY: Chronic LBP; Arthritis; HTN; gall bladder removed; L TKA 2014; L posterior hip pain; L shoulder pain; L reverse TSA 06/25/23; L breast cancer continues on estrogen inhibitor treatment; chronic foley catheter   PAIN:  Are you having pain? Yes: NPRS scale: 1/10; best 0/10  Pain location: R shoulder  Pain description: nagging, aching, sore    Aggravating factors: using arm  Relieving factors: rubbing, ice   PRECAUTIONS: Shoulder reverse total  shoulder  WEIGHT BEARING RESTRICTIONS: Yes no weight bearing   FALLS:  Has patient fallen in last 6 months? No  LIVING ENVIRONMENT: Lives with: lives alone Lives in: House/apartment Stairs: Yes: External: 1 steps; none Has following equipment at home: Vannie - 4 wheeled, Shower bench, bed side commode, and Grab bars  OCCUPATION: Retired from office work  Household chores; cooking; Pharmacologist; reading; tv   PATIENT GOALS:more ROM in R shoulder; strength R UE  NEXT MD VISIT: 09/24/24  OBJECTIVE:  Note: Objective measures were completed at Evaluation unless otherwise noted.  DIAGNOSTIC FINDINGS:  Xray R shoulder 07/06/23: Status post reverse right shoulder replacement with normal alignment. Gas in the soft tissues consistent with recent surgery. Mild AC joint degenerative change   IMPRESSION: Status post reverse right shoulder replacement with expected postsurgical change.  PATIENT SURVEYS:  PSFS: THE PATIENT SPECIFIC FUNCTIONAL SCALE  Place score of 0-10 (0 = unable to perform activity and 10 = able to perform activity at the same level as before injury or problem)  Activity Date: 08/19/24    Reaching back or behind back  0    2. Reaching across body to put seat belt on 1    3. Reaching to top of head  6    4. Cutting food  5    Total Score 12      Total Score = Sum of activity scores/number of activities 12/4 = 3  Minimally Detectable Change: 3 points (for single activity); 2 points (for average score)  [facility] (nd). The Patient Specific Functional Scale . Retrieved from [email or web address]   COGNITION: Overall cognitive status: Within functional limits for tasks assessed     SENSATION: WFL  POSTURE: Head forward; shoulders rounded and elevated; head of the humerus anterior in orientation  UPPER EXTREMITY ROM:   Active ROM Right eval Left eval  Shoulder flexion 94 122  Shoulder  extension    Shoulder abduction 98 137  Shoulder adduction    Shoulder internal rotation Lateral hip  Buttocks   Shoulder external rotation 30 30  Elbow flexion    Elbow extension    Wrist flexion    Wrist extension    Wrist ulnar deviation    Wrist radial deviation    Wrist pronation    Wrist supination    (Blank rows = not tested)  UPPER EXTREMITY MMT: not tested resistively due to surgery   MMT Right eval Left eval  Shoulder flexion    Shoulder extension    Shoulder abduction    Shoulder adduction    Shoulder internal rotation    Shoulder external rotation    Middle trapezius    Lower trapezius    Elbow flexion    Elbow extension    Wrist flexion    Wrist extension    Wrist ulnar deviation    Wrist radial deviation    Wrist pronation    Wrist supination    Grip strength (lbs)    (Blank rows = not tested)  PALPATION:  Muscular tightness R shoulder girdle - upper trap, leveator; pecs; deltoid; biceps    [facility] Adult PT Treatment:                                                DATE: 08/21/24 Therapeutic Exercise: Standing  Pendulum circles CW/CCW Sitting  Pulley flexion 10 sec x  10  Manual Therapy: STM R shoulder girdle  Neuromuscular re-ed: Scap squeeze with noodle sitting 10 sec x 10  Therapeutic Activity: Sitting  ER with cane sitting 10 sec x 10  Shoulder flexion assisted with rolling walker  Standing  Isometric abduction 5 sec x 10  Isometric ER 5 sec x 10   Modalities:  Self Care: Encouraged consistent exercise at home     PATIENT EDUCATION: Education details: POC; HEP  Person educated: Patient Education method: Programmer, multimedia, Demonstration, Actor cues, Verbal cues, and Handouts Education comprehension: verbalized understanding, returned demonstration, verbal cues required, tactile cues required, and needs further education  HOME EXERCISE PROGRAM: Access Code: [email or web address]  - exercises from prior treatment following L reverse TSA - exercises  issued today noted in bold type  URL: [email or web address] Date: 08/19/2024 Prepared by: Christhoper Busbee  Exercises - Circular Shoulder Pendulum with Table Support  - 3-4 x daily - 7 x weekly - 1 sets - 20-30 reps - Seated Shoulder Flexion AAROM with Pulley Behind  - 2 x daily - 7 x weekly - 1 sets - 10 reps - 10 sec  hold - Seated Shoulder Scaption AAROM with Pulley at Side  - 2 x daily - 7 x weekly - 1 sets - 10 reps - 10sec  hold - Seated Scapular Retraction  - 2 x daily - 7 x weekly - 1-2 sets - 10 reps - 10 sec  hold - Seated Bilateral Shoulder Flexion Towel Slide at Table Top  - 2 x daily - 7 x weekly - 1 sets - 5-10 reps - 10 sec  hold - Seated Shoulder External Rotation AAROM with Cane and Hand in Neutral  - 2 x daily - 7 x weekly - 1 sets - 5-10 reps - 5-10 sec  hold - Isometric Shoulder Extension at Wall  - 2 x daily - 7 x weekly - 1 sets - 5-10 reps - 5 sec  hold - Standing Isometric Shoulder Abduction with Doorway - Arm Bent  - 2 x daily - 7 x weekly - 1 sets - 5-10 reps - 5 sec  hold - Standing Isometric Shoulder External Rotation with Doorway  - 2 x daily - 7 x weekly - 1 sets - 5-10 reps - 5 sec  hold - Standing Isometric Shoulder Internal Rotation at Doorway  - 2 x daily - 7 x weekly - 1 sets - 5-10 reps - 5 sec  hold - Standing Bilateral Low Shoulder Row with Anchored Resistance  - 1-2 x daily - 7 x weekly - 1-2 sets - 10 reps - 2-3 sec  hold - Shoulder extension with resistance - Neutral  - 1-2 x daily - 7 x weekly - 1-2 sets - 10 reps - 3-5 sec  hold - Seated Sidebending  - 2 x daily - 7 x weekly - 1 sets - 3-5 reps - 15-20sec  hold - Seated Trunk Rotation - Arms Crossed  - 2 x daily - 7 x weekly - 1 sets - 3-5 reps - 5-10sec  hold - Supine Alternating Shoulder Flexion  - 2 x daily - 7 x weekly - 1-2 sets - 10 reps - 2 sec  hold - Supine Single Arm Shoulder Protraction  - 2 x daily - 7 x weekly - 1 sets - 10-15 reps -   hold - Supine Shoulder Rhythmic Stabilization-  Horizontal Abduction/Adduction  - 2 x daily - 7 x weekly - 1 sets - 10-20 reps - Shoulder External  Rotation and Scapular Retraction with Resistance  - 2 x daily - 7 x weekly - 1 sets - 10 reps - 3-5 sec  hold - Standing Shoulder Flexion to 90 Degrees  - 2 x daily - 7 x weekly - 1 sets - 10 reps - 3 sec  hold - Standing Shoulder Scaption  - 2 x daily - 7 x weekly - 1 sets - 10 reps - 3 sec  hold - Supine Shoulder Rhythmic Stabilization- Horizontal Abduction/Adduction  - 2 x daily - 7 x weekly - 1 sets - 5-10 reps - Shoulder External Rotation with Anchored Resistance  - 2 x daily - 7 x weekly - 1-2 sets - 10 reps - 3 sec  hold - Shoulder Internal Rotation with Resistance  - 2 x daily - 7 x weekly - 2 sets - 10 reps - 3 sec  hold - Shoulder External Rotation and Scapular Retraction with Resistance  - 2 x daily - 7 x weekly - 1 sets - 10 reps - 3-5 sec  hold  ASSESSMENT:  CLINICAL IMPRESSION: Good tolerance of initial HEP. Reviewed and progressed exercises with focus on AAROM and scapular stabilization. Excellent progress with AAROM shoulder flexion. Trial of vaso to control irritation from exercises.  Eval: Patient is a 78 y.o. female who was seen today for physical therapy evaluation and treatment s/p R reverse TSA 07/05/24. She presents with poor posture and alignment; limited shoulder ROM, strength, function; pain on an intermittent basis; limited functional and ADL activity tolerance. Patient will benefit from PT to address problems identified.   OBJECTIVE IMPAIRMENTS: Abnormal gait, decreased activity tolerance, decreased balance, decreased mobility, decreased ROM, decreased strength, increased fascial restrictions, increased muscle spasms, impaired UE functional use, improper body mechanics, postural dysfunction, obesity, and pain.    GOALS: Goals reviewed with patient? Yes  SHORT TERM GOALS: Target date: 09/16/2024   Independent in initial HEP  Baseline: Goal status: INITIAL  2.   Patient reports ability to cur food with minimal to no difficulty  Baseline:  Goal status: INITIAL   LONG TERM GOALS: Target date: 10/14/2024   Patient demonstrates increase in AROM R shoulder to equal or greater than AROM L shoulder  Baseline:  Goal status: INITIAL  2.  Patient demonstrates 3/5 to 4/5 strength to use R UE for functional activities  Baseline:  Goal status: INITIAL  3.  Patient reports ability to fasten and unfasten seatbelt in care with minimal difficulty  Baseline:  Goal status: INITIAL  4.  Patient demonstrates ability to reach behind back and to head level for personal care  Baseline:  Goal status: INITIAL  5.  INCREASE PSFS: THE PATIENT SPECIFIC FUNCTIONAL SCALE SCORE BY 2 POINTS   Place score of 0-10 (0 = unable to perform activity and 10 = able to perform activity at the same level as before injury or problem)  Activity Date: 08/19/24    Reaching back or behind back  0    2. Reaching across body to put seat belt on 1    3. Reaching to top of head  6    4. Cutting food  5    Total Score 12      Total Score = Sum of activity scores/number of activities 12/4 = 3  Minimally Detectable Change: 3 points (for single activity); 2 points (for average score) Baseline: 3 Goal status: INITIAL  6.  Independent in advanced HEP  Baseline:  Goal status: INITIAL  PLAN:  PT FREQUENCY:  2x/week  PT DURATION: 8 weeks  PLANNED INTERVENTIONS: 97164- PT Re-evaluation, 97110-Therapeutic exercises, 97530- Therapeutic activity, 97112- Neuromuscular re-education, 97535- Self Care, [id number]- Manual therapy, Patient/Family education, Taping, and Joint mobilization  PLAN FOR NEXT SESSION: review and progress exercises; continue with education and home instruction; manual work and modalities as indicated    [employer], PT 08/21/2024, 1:20 PM   Addendum: TC to [facility] requesting protocol for reverse TSA for Dr Kay. Left message with Abundio

## 2024-08-26 ENCOUNTER — Ambulatory Visit (HOSPITAL_COMMUNITY)
Admission: RE | Admit: 2024-08-26 | Discharge: 2024-08-26 | Disposition: A | Discharge status: Home / Self Care | Source: Ambulatory Visit | Attending: Emergency Medicine | Admitting: Emergency Medicine

## 2024-08-26 ENCOUNTER — Ambulatory Visit: Payer: Self-pay | Admitting: Emergency Medicine

## 2024-08-26 DIAGNOSIS — R600 Localized edema: Secondary | ICD-10-CM | POA: Insufficient documentation

## 2024-08-26 NOTE — Telephone Encounter (Signed)
-----   Message from Miriam FORBES Shams sent at 08/26/2024  5:13 PM EDT ----- Please let Brooke Duncan know she does not have a blood clot in her leg. There does appear to be a cyst around her right knee. If she's continuing to have symptoms, she will need to follow up with orthopedics  or her PCP regarding this issue. Take care. ----- Message ----- From: Interface, [employer] Sent: 08/26/2024   2:40 PM EDT To: Kenzie E Campbell, NP

## 2024-08-26 NOTE — Telephone Encounter (Signed)
 Patient notified

## 2024-08-28 ENCOUNTER — Ambulatory Visit

## 2024-08-28 DIAGNOSIS — R29898 Other symptoms and signs involving the musculoskeletal system: Secondary | ICD-10-CM

## 2024-08-28 DIAGNOSIS — G8929 Other chronic pain: Secondary | ICD-10-CM

## 2024-08-28 DIAGNOSIS — R293 Abnormal posture: Secondary | ICD-10-CM

## 2024-08-28 DIAGNOSIS — M6281 Muscle weakness (generalized): Secondary | ICD-10-CM

## 2024-08-28 NOTE — Therapy (Signed)
 OUTPATIENT PHYSICAL THERAPY SHOULDER TREATMENT   Patient Name: Brooke Duncan MRN: [id number] DOB:1946-03-05, 78 y.o., female Today's Date: 08/28/2024  END OF SESSION:  PT End of Session - 08/28/24 1358     Visit Number 3    Number of Visits 16    Date for Recertification  10/14/24    Progress Note Due on Visit 10    PT Start Time 1400    PT Stop Time 1452    PT Time Calculation (min) 52 min    Activity Tolerance Patient tolerated treatment well    Behavior During Therapy [facility] for tasks assessed/performed          Past Medical History:  Diagnosis Date   Acquired solitary kidney    right side 2014   Arthritis    rt knee rt shoulder   Bronchitis 05/08/2023   Cancer (HCC)    renal cell carcinoma- left kidney removed and left breast cancer   Complication of anesthesia    slow to wake, hard to take a deep breath with 1 surgery several yrs ago   Foley catheter in place    Frequency of urination    Heart murmur    per told by pcp approx. 02/ 2022 heard a very faint murmur, told did need work-up done at this time   History of COVID-19 2021   all symptoms resolved   History of Hashimoto thyroiditis    s/p  total thyroidectomy 1995   History of hypercalcemia    s/p  parathryoidectomy 07/ 2021   History of kidney stones    History of renal cell carcinoma 2014   s/p  left nephrectomy, pt stated no other treatment   Hypertension    followed by pcp   Hypothyroidism, postsurgical 1995   followed by pcp in [place], moved from [place]  01/ 2021   OSA on CPAP    Thickened endometrium    UTI (urinary tract infection)    finished keflex  08-12-2022   Wears glasses    Past Surgical History:  Procedure Laterality Date   BREAST BIOPSY Left 08/24/2021   BREAST LUMPECTOMY WITH RADIOACTIVE SEED LOCALIZATION Left 07/14/2022   Procedure: LEFT BREAST LUMPECTOMY WITH RADIOACTIVE SEED LOCALIZATION;  Surgeon: Ebbie Cough, MD;  Location: [facility];  Service: General;   Laterality: Left;   COLON SURGERY     10/2022 at [facility]   CYSTOSCOPY  08/04/2022   in dr winter office   CYSTOSCOPY WITH BIOPSY N/A 09/02/2022   Procedure: CYSTOSCOPY WITH BLADDER BIOPSY;  Surgeon: Devere Lonni Righter, MD;  Location: [facility];  Service: Urology;  Laterality: N/A;  ONLY NEEDS 30 MIN   HYSTEROSCOPY WITH D & C N/A 04/08/2021   Procedure: DILATATION AND CURETTAGE /HYSTEROSCOPY, EXCISION OF VAGINAL LESION;  Surgeon: Darcel Pool, MD;  Location: [facility];  Service: Gynecology;  Laterality: N/A;   LAPAROSCOPIC CHOLECYSTECTOMY  2005   NEPHRECTOMY Left 2014   PARATHYROIDECTOMY  07/ 2021  in [place]    REVERSE SHOULDER ARTHROPLASTY Left 06/16/2023   Procedure: REVERSE SHOULDER ARTHROPLASTY;  Surgeon: Kay Kemps, MD;  Location: [facility];  Service: Orthopedics;  Laterality: Left;   REVERSE SHOULDER ARTHROPLASTY Right 07/05/2024   Procedure: ARTHROPLASTY, SHOULDER, TOTAL, REVERSE;  Surgeon: Kay Kemps, MD;  Location: [facility];  Service: Orthopedics;  Laterality: Right;   TONSILLECTOMY  age 61   TOTAL HIP ARTHROPLASTY Bilateral left 2015;  right 2009   TOTAL KNEE ARTHROPLASTY Left 2014   TOTAL THYROIDECTOMY Bilateral  1995   TRIGGER FINGER RELEASE Right 01/24/2024   Procedure: RELEASE TRIGGER FINGER/A-1 PULLEY MIDDLE AND RING;  Surgeon: Romona Harari, MD;  Location: [facility];  Service: Orthopedics;  Laterality: Right;  local   WISDOM TOOTH EXTRACTION Bilateral    Patient Active Problem List   Diagnosis Date Noted   Primary hypertension 07/09/2024   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) 07/09/2024   OSA (obstructive sleep apnea) 07/09/2024   Demand ischemia of myocardium (HCC) 07/09/2024   NSTEMI (non-ST elevated myocardial infarction) (HCC) 07/08/2024   S/P shoulder replacement, right 07/05/2024   H/O total shoulder replacement, left 06/16/2023   Colonic mass 11/03/2022   Malignant neoplasm of  upper-outer quadrant of left breast in female, estrogen receptor positive (HCC) 08/27/2021   Abnormal ultrasound of endometrium 04/08/2021    PCP: Dr Carlin Dale Gull  REFERRING PROVIDER: Dr Marcey Her   REFERRING DIAG: R reverse total shoulder   THERAPY DIAG:  Other symptoms and signs involving the musculoskeletal system  Muscle weakness (generalized)  Chronic right shoulder pain  Abnormal posture  Rationale for Evaluation and Treatment: Rehabilitation  ONSET DATE: 07/05/24  SUBJECTIVE:                                                                                                                                                                                      SUBJECTIVE STATEMENT: Patient reports she has more soreness than pain in shoulder.   Eval: Patient underwent R reverse total shoulder 07/05/24 but ended up back in the hospital 07/08/24 with difficulty breathing. She was diagnosed with slightly enlarged heart. She will be followed by cardiology for cardiac problems. She has had cellulitis for R LE. She continues to have problems with retaining fluids in bilat LE's, R > L which is bing followed by PCP, Dr Gull. She will have a reflux vascular test 11/26/24.  Her R shoulder is doing well. She has done better with the R shoulder replacement than the L TSA. She is 6 weeks 3 days post op. Xrays at follow up last week looked good     Hand dominance: Right  PERTINENT HISTORY: Chronic LBP; Arthritis; HTN; gall bladder removed; L TKA 2014; L posterior hip pain; L shoulder pain; L reverse TSA 06/25/23; L breast cancer continues on estrogen inhibitor treatment; chronic foley catheter   PAIN:  Are you having pain? Yes: NPRS scale: 1/10; best 0/10  Pain location: R shoulder  Pain description: nagging, aching, sore    Aggravating factors: using arm  Relieving factors: rubbing, ice   PRECAUTIONS: Shoulder reverse total shoulder    WEIGHT BEARING RESTRICTIONS: No; caution with  lifting  FALLS:  Has patient fallen in last 6 months? No  LIVING ENVIRONMENT: Lives with: lives alone Lives in: House/apartment Stairs: Yes: External: 1 steps; none Has following equipment at home: Vannie - 4 wheeled, Shower bench, bed side commode, and Grab bars  OCCUPATION: Retired from office work  Household chores; cooking; Pharmacologist; reading; tv   PATIENT GOALS:more ROM in R shoulder; strength R UE  NEXT MD VISIT: 09/24/24  OBJECTIVE:  Note: Objective measures were completed at Evaluation unless otherwise noted.  DIAGNOSTIC FINDINGS:  Xray R shoulder 07/06/23: Status post reverse right shoulder replacement with normal alignment. Gas in the soft tissues consistent with recent surgery. Mild AC joint degenerative change   IMPRESSION: Status post reverse right shoulder replacement with expected postsurgical change.  PATIENT SURVEYS:  PSFS: THE PATIENT SPECIFIC FUNCTIONAL SCALE  Place score of 0-10 (0 = unable to perform activity and 10 = able to perform activity at the same level as before injury or problem)  Activity Date: 08/19/24    Reaching back or behind back  0    2. Reaching across body to put seat belt on 1    3. Reaching to top of head  6    4. Cutting food  5    Total Score 12      Total Score = Sum of activity scores/number of activities 12/4 = 3  Minimally Detectable Change: 3 points (for single activity); 2 points (for average score)  [facility] (nd). The Patient Specific Functional Scale . Retrieved from [email or web address]   COGNITION: Overall cognitive status: Within functional limits for tasks assessed     SENSATION: WFL  POSTURE: Head forward; shoulders rounded and elevated; head of the humerus anterior in orientation  UPPER EXTREMITY ROM:   Active ROM Right eval Left eval  Shoulder flexion 94 122  Shoulder extension    Shoulder abduction 98 137  Shoulder adduction     Shoulder internal rotation Lateral hip  Buttocks   Shoulder external rotation 30 30  Elbow flexion    Elbow extension    Wrist flexion    Wrist extension    Wrist ulnar deviation    Wrist radial deviation    Wrist pronation    Wrist supination    (Blank rows = not tested)  UPPER EXTREMITY MMT: not tested resistively due to surgery   MMT Right eval Left eval  Shoulder flexion    Shoulder extension    Shoulder abduction    Shoulder adduction    Shoulder internal rotation    Shoulder external rotation    Middle trapezius    Lower trapezius    Elbow flexion    Elbow extension    Wrist flexion    Wrist extension    Wrist ulnar deviation    Wrist radial deviation    Wrist pronation    Wrist supination    Grip strength (lbs)    (Blank rows = not tested)  PALPATION:  Muscular tightness R shoulder girdle - upper trap, leveator; pecs; deltoid; biceps     [facility] Adult PT Treatment:                                                DATE: 08/28/2024 Neuromuscular re-ed: Shoulder isometrics: flexion, abd, ER Seated scapula retraction Therapeutic Activity: Standing pendulums Pulleys shoulder flexion 10x10 Seated shoulder PROM per  protocol to tolerance --> flexion, abd, ER Modalities: Ice pack on Rt shoulder x 10 min Self Care: Review of protocol guidelines   [facility] Adult PT Treatment:                                                DATE: 08/21/24 Therapeutic Exercise: Standing  Pendulum circles CW/CCW Sitting  Pulley flexion 10 sec x 10  Manual Therapy: STM R shoulder girdle  Neuromuscular re-ed: Scap squeeze with noodle sitting 10 sec x 10  Therapeutic Activity: Sitting  ER with cane sitting 10 sec x 10  Shoulder flexion assisted with rolling walker  Standing  Isometric abduction 5 sec x 10  Isometric ER 5 sec x 10   Modalities:  Self Care: Encouraged consistent exercise at home     PATIENT EDUCATION: Education details: POC; HEP  Person educated:  Patient Education method: Programmer, multimedia, Facilities manager, Actor cues, Verbal cues, and Handouts Education comprehension: verbalized understanding, returned demonstration, verbal cues required, tactile cues required, and needs further education  HOME EXERCISE PROGRAM: Access Code: [email or web address]  - exercises from prior treatment following L reverse TSA - exercises issued today noted in bold type  URL: [email or web address] Date: 08/19/2024 Prepared by: Celyn Holt  Exercises - Circular Shoulder Pendulum with Table Support  - 3-4 x daily - 7 x weekly - 1 sets - 20-30 reps - Seated Shoulder Flexion AAROM with Pulley Behind  - 2 x daily - 7 x weekly - 1 sets - 10 reps - 10 sec  hold - Seated Shoulder Scaption AAROM with Pulley at Side  - 2 x daily - 7 x weekly - 1 sets - 10 reps - 10sec  hold - Seated Scapular Retraction  - 2 x daily - 7 x weekly - 1-2 sets - 10 reps - 10 sec  hold - Seated Bilateral Shoulder Flexion Towel Slide at Table Top  - 2 x daily - 7 x weekly - 1 sets - 5-10 reps - 10 sec  hold - Seated Shoulder External Rotation AAROM with Cane and Hand in Neutral  - 2 x daily - 7 x weekly - 1 sets - 5-10 reps - 5-10 sec  hold - Isometric Shoulder Extension at Wall  - 2 x daily - 7 x weekly - 1 sets - 5-10 reps - 5 sec  hold - Standing Isometric Shoulder Abduction with Doorway - Arm Bent  - 2 x daily - 7 x weekly - 1 sets - 5-10 reps - 5 sec  hold - Standing Isometric Shoulder External Rotation with Doorway  - 2 x daily - 7 x weekly - 1 sets - 5-10 reps - 5 sec  hold - Standing Isometric Shoulder Internal Rotation at Doorway  - 2 x daily - 7 x weekly - 1 sets - 5-10 reps - 5 sec  hold - Standing Bilateral Low Shoulder Row with Anchored Resistance  - 1-2 x daily - 7 x weekly - 1-2 sets - 10 reps - 2-3 sec  hold - Shoulder extension with resistance - Neutral  - 1-2 x daily - 7 x weekly - 1-2 sets - 10 reps - 3-5 sec  hold - Seated Sidebending  - 2 x daily - 7 x weekly - 1 sets - 3-5  reps - 15-20sec  hold - Seated Trunk Rotation - Arms Crossed  - 2  x daily - 7 x weekly - 1 sets - 3-5 reps - 5-10sec  hold - Supine Alternating Shoulder Flexion  - 2 x daily - 7 x weekly - 1-2 sets - 10 reps - 2 sec  hold - Supine Single Arm Shoulder Protraction  - 2 x daily - 7 x weekly - 1 sets - 10-15 reps -   hold - Supine Shoulder Rhythmic Stabilization- Horizontal Abduction/Adduction  - 2 x daily - 7 x weekly - 1 sets - 10-20 reps - Shoulder External Rotation and Scapular Retraction with Resistance  - 2 x daily - 7 x weekly - 1 sets - 10 reps - 3-5 sec  hold - Standing Shoulder Flexion to 90 Degrees  - 2 x daily - 7 x weekly - 1 sets - 10 reps - 3 sec  hold - Standing Shoulder Scaption  - 2 x daily - 7 x weekly - 1 sets - 10 reps - 3 sec  hold - Supine Shoulder Rhythmic Stabilization- Horizontal Abduction/Adduction  - 2 x daily - 7 x weekly - 1 sets - 5-10 reps - Shoulder External Rotation with Anchored Resistance  - 2 x daily - 7 x weekly - 1-2 sets - 10 reps - 3 sec  hold - Shoulder Internal Rotation with Resistance  - 2 x daily - 7 x weekly - 2 sets - 10 reps - 3 sec  hold - Shoulder External Rotation and Scapular Retraction with Resistance  - 2 x daily - 7 x weekly - 1 sets - 10 reps - 3-5 sec  hold  ASSESSMENT:  CLINICAL IMPRESSION: Continued phase 2 of protocol, adding shoulder isometrics today with no exacerbation of pain. Noted muscle guarding during shoulder PROM; good tolerance with ER and abd PROM. Reviewed protocol guidelines with patient, clarifying no stressing on muscle tissue or aggressive pushing with exercises.   Eval: Patient is a 78 y.o. female who was seen today for physical therapy evaluation and treatment s/p R reverse TSA 07/05/24. She presents with poor posture and alignment; limited shoulder ROM, strength, function; pain on an intermittent basis; limited functional and ADL activity tolerance. Patient will benefit from PT to address problems identified.   OBJECTIVE  IMPAIRMENTS: Abnormal gait, decreased activity tolerance, decreased balance, decreased mobility, decreased ROM, decreased strength, increased fascial restrictions, increased muscle spasms, impaired UE functional use, improper body mechanics, postural dysfunction, obesity, and pain.    GOALS: Goals reviewed with patient? Yes  SHORT TERM GOALS: Target date: 09/16/2024  Independent in initial HEP  Baseline: Goal status: INITIAL  2.  Patient reports ability to cur food with minimal to no difficulty  Baseline:  Goal status: INITIAL   LONG TERM GOALS: Target date: 10/14/2024  Patient demonstrates increase in AROM R shoulder to equal or greater than AROM L shoulder  Baseline:  Goal status: INITIAL  2.  Patient demonstrates 3/5 to 4/5 strength to use R UE for functional activities  Baseline:  Goal status: INITIAL  3.  Patient reports ability to fasten and unfasten seatbelt in care with minimal difficulty  Baseline:  Goal status: INITIAL  4.  Patient demonstrates ability to reach behind back and to head level for personal care  Baseline:  Goal status: INITIAL  5.  INCREASE PSFS: THE PATIENT SPECIFIC FUNCTIONAL SCALE SCORE BY 2 POINTS   Place score of 0-10 (0 = unable to perform activity and 10 = able to perform activity at the same level as before injury or problem)  Activity Date:  08/19/24    Reaching back or behind back  0    2. Reaching across body to put seat belt on 1    3. Reaching to top of head  6    4. Cutting food  5    Total Score 12      Total Score = Sum of activity scores/number of activities 12/4 = 3  Minimally Detectable Change: 3 points (for single activity); 2 points (for average score) Baseline: 3 Goal status: INITIAL  6.  Independent in advanced HEP  Baseline:  Goal status: INITIAL  PLAN:  PT FREQUENCY: 2x/week  PT DURATION: 8 weeks  PLANNED INTERVENTIONS: 97164- PT Re-evaluation, 97110-Therapeutic exercises, 97530- Therapeutic activity, 97112-  Neuromuscular re-education, 97535- Self Care, [id number]- Manual therapy, Patient/Family education, Taping, and Joint mobilization  PLAN FOR NEXT SESSION: Phase 2: shoulder isometrics, PROM flex - 115, abd 90, ER - 45; pulleys to tolerance; pendulums. No aggressive push on ROM. Progress AAROM and AROM in all planes.    Lamarr GORMAN Price, PTA 08/28/2024, 2:48 PM

## 2024-09-02 ENCOUNTER — Encounter: Payer: Self-pay | Admitting: Rehabilitative and Restorative Service Providers"

## 2024-09-02 ENCOUNTER — Ambulatory Visit: Admitting: Rehabilitative and Restorative Service Providers"

## 2024-09-02 DIAGNOSIS — M6281 Muscle weakness (generalized): Secondary | ICD-10-CM

## 2024-09-02 DIAGNOSIS — G8929 Other chronic pain: Secondary | ICD-10-CM

## 2024-09-02 DIAGNOSIS — R293 Abnormal posture: Secondary | ICD-10-CM

## 2024-09-02 DIAGNOSIS — R29898 Other symptoms and signs involving the musculoskeletal system: Secondary | ICD-10-CM | POA: Diagnosis not present

## 2024-09-02 NOTE — Therapy (Signed)
 OUTPATIENT PHYSICAL THERAPY SHOULDER TREATMENT   Patient Name: Brooke Duncan MRN: [id number] DOB:June 13, 1946, 78 y.o., female Today's Date: 09/02/2024  END OF SESSION:  PT End of Session - 09/02/24 1306     Visit Number 4    Number of Visits 16    Date for Recertification  10/14/24    Authorization Type medicare BCN+BS supplement    Progress Note Due on Visit 10    PT Start Time 1310    PT Stop Time 1400    PT Time Calculation (min) 50 min    Activity Tolerance Patient tolerated treatment well          Past Medical History:  Diagnosis Date   Acquired solitary kidney    right side 2014   Arthritis    rt knee rt shoulder   Bronchitis 05/08/2023   Cancer (HCC)    renal cell carcinoma- left kidney removed and left breast cancer   Complication of anesthesia    slow to wake, hard to take a deep breath with 1 surgery several yrs ago   Foley catheter in place    Frequency of urination    Heart murmur    per told by pcp approx. 02/ 2022 heard a very faint murmur, told did need work-up done at this time   History of COVID-19 2021   all symptoms resolved   History of Hashimoto thyroiditis    s/p  total thyroidectomy 1995   History of hypercalcemia    s/p  parathryoidectomy 07/ 2021   History of kidney stones    History of renal cell carcinoma 2014   s/p  left nephrectomy, pt stated no other treatment   Hypertension    followed by pcp   Hypothyroidism, postsurgical 1995   followed by pcp in [place], moved from [place]  01/ 2021   OSA on CPAP    Thickened endometrium    UTI (urinary tract infection)    finished keflex  08-12-2022   Wears glasses    Past Surgical History:  Procedure Laterality Date   BREAST BIOPSY Left 08/24/2021   BREAST LUMPECTOMY WITH RADIOACTIVE SEED LOCALIZATION Left 07/14/2022   Procedure: LEFT BREAST LUMPECTOMY WITH RADIOACTIVE SEED LOCALIZATION;  Surgeon: Ebbie Cough, MD;  Location: [facility];  Service: General;   Laterality: Left;   COLON SURGERY     10/2022 at [facility]   CYSTOSCOPY  08/04/2022   in dr winter office   CYSTOSCOPY WITH BIOPSY N/A 09/02/2022   Procedure: CYSTOSCOPY WITH BLADDER BIOPSY;  Surgeon: Devere Lonni Righter, MD;  Location: [facility];  Service: Urology;  Laterality: N/A;  ONLY NEEDS 30 MIN   HYSTEROSCOPY WITH D & C N/A 04/08/2021   Procedure: DILATATION AND CURETTAGE /HYSTEROSCOPY, EXCISION OF VAGINAL LESION;  Surgeon: Darcel Pool, MD;  Location: [facility];  Service: Gynecology;  Laterality: N/A;   LAPAROSCOPIC CHOLECYSTECTOMY  2005   NEPHRECTOMY Left 2014   PARATHYROIDECTOMY  07/ 2021  in [place]    REVERSE SHOULDER ARTHROPLASTY Left 06/16/2023   Procedure: REVERSE SHOULDER ARTHROPLASTY;  Surgeon: Kay Kemps, MD;  Location: [facility];  Service: Orthopedics;  Laterality: Left;   REVERSE SHOULDER ARTHROPLASTY Right 07/05/2024   Procedure: ARTHROPLASTY, SHOULDER, TOTAL, REVERSE;  Surgeon: Kay Kemps, MD;  Location: [facility];  Service: Orthopedics;  Laterality: Right;   TONSILLECTOMY  age 43   TOTAL HIP ARTHROPLASTY Bilateral left 2015;  right 2009   TOTAL KNEE ARTHROPLASTY Left 2014   TOTAL THYROIDECTOMY Bilateral 1995  TRIGGER FINGER RELEASE Right 01/24/2024   Procedure: RELEASE TRIGGER FINGER/A-1 PULLEY MIDDLE AND RING;  Surgeon: Romona Harari, MD;  Location:  SURGERY CENTER;  Service: Orthopedics;  Laterality: Right;  local   WISDOM TOOTH EXTRACTION Bilateral    Patient Active Problem List   Diagnosis Date Noted   Primary hypertension 07/09/2024   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) 07/09/2024   OSA (obstructive sleep apnea) 07/09/2024   Demand ischemia of myocardium (HCC) 07/09/2024   NSTEMI (non-ST elevated myocardial infarction) (HCC) 07/08/2024   S/P shoulder replacement, right 07/05/2024   H/O total shoulder replacement, left 06/16/2023   Colonic mass 11/03/2022   Malignant neoplasm of  upper-outer quadrant of left breast in female, estrogen receptor positive (HCC) 08/27/2021   Abnormal ultrasound of endometrium 04/08/2021    PCP: Dr Carlin Dale Gull  REFERRING PROVIDER: Dr Marcey Her   REFERRING DIAG: R reverse total shoulder   THERAPY DIAG:  Other symptoms and signs involving the musculoskeletal system  Muscle weakness (generalized)  Chronic right shoulder pain  Abnormal posture  Rationale for Evaluation and Treatment: Rehabilitation  ONSET DATE: 07/05/24  SUBJECTIVE:                                                                                                                                                                                      SUBJECTIVE STATEMENT: Patient reports she has been doing her exercises at home. She has some soreness, not much pain in R shoulder.   Eval: Patient underwent R reverse total shoulder 07/05/24 but ended up back in the hospital 07/08/24 with difficulty breathing. She was diagnosed with slightly enlarged heart. She will be followed by cardiology for cardiac problems. She has had cellulitis for R LE. She continues to have problems with retaining fluids in bilat LE's, R > L which is bing followed by PCP, Dr Gull. She will have a reflux vascular test 11/26/24.  Her R shoulder is doing well. She has done better with the R shoulder replacement than the L TSA. She is 6 weeks 3 days post op. Xrays at follow up last week looked good     Hand dominance: Right  PERTINENT HISTORY: Chronic LBP; Arthritis; HTN; gall bladder removed; L TKA 2014; L posterior hip pain; L shoulder pain; L reverse TSA 06/25/23; L breast cancer continues on estrogen inhibitor treatment; chronic foley catheter   PAIN:  Are you having pain? Yes: NPRS scale: 0/10; best 0/10  Pain location: R shoulder  Pain description: nagging, aching, sore    Aggravating factors: using arm  Relieving factors: rubbing, ice   PRECAUTIONS: Shoulder reverse total shoulder     WEIGHT  BEARING RESTRICTIONS: No; caution with lifting   FALLS:  Has patient fallen in last 6 months? No  LIVING ENVIRONMENT: Lives with: lives alone Lives in: House/apartment Stairs: Yes: External: 1 steps; none Has following equipment at home: Vannie - 4 wheeled, Shower bench, bed side commode, and Grab bars  OCCUPATION: Retired from office work  Household chores; cooking; Pharmacologist; reading; tv   PATIENT GOALS:more ROM in R shoulder; strength R UE  NEXT MD VISIT: 09/24/24  OBJECTIVE:  Note: Objective measures were completed at Evaluation unless otherwise noted.  DIAGNOSTIC FINDINGS:  Xray R shoulder 07/06/23: Status post reverse right shoulder replacement with normal alignment. Gas in the soft tissues consistent with recent surgery. Mild AC joint degenerative change   IMPRESSION: Status post reverse right shoulder replacement with expected postsurgical change.  PATIENT SURVEYS:  PSFS: THE PATIENT SPECIFIC FUNCTIONAL SCALE  Place score of 0-10 (0 = unable to perform activity and 10 = able to perform activity at the same level as before injury or problem)  Activity Date: 08/19/24    Reaching back or behind back  0    2. Reaching across body to put seat belt on 1    3. Reaching to top of head  6    4. Cutting food  5    Total Score 12      Total Score = Sum of activity scores/number of activities 12/4 = 3  Minimally Detectable Change: 3 points (for single activity); 2 points (for average score)  [facility] (nd). The Patient Specific Functional Scale . Retrieved from [email or web address]    POSTURE: Head forward; shoulders rounded and elevated; head of the humerus anterior in orientation  UPPER EXTREMITY ROM:   Active ROM Right eval Left eval  Shoulder flexion 94 122  Shoulder extension    Shoulder abduction 98 137  Shoulder adduction    Shoulder internal rotation Lateral hip  Buttocks    Shoulder external rotation 30 30  Elbow flexion    Elbow extension    Wrist flexion    Wrist extension    Wrist ulnar deviation    Wrist radial deviation    Wrist pronation    Wrist supination    (Blank rows = not tested)  UPPER EXTREMITY MMT: not tested resistively due to surgery   MMT Right eval Left eval  Shoulder flexion    Shoulder extension    Shoulder abduction    Shoulder adduction    Shoulder internal rotation    Shoulder external rotation    Middle trapezius    Lower trapezius    Elbow flexion    Elbow extension    Wrist flexion    Wrist extension    Wrist ulnar deviation    Wrist radial deviation    Wrist pronation    Wrist supination    Grip strength (lbs)    (Blank rows = not tested)  PALPATION:  Muscular tightness R shoulder girdle - upper trap, leveator; pecs; deltoid; biceps     [facility] Adult PT Treatment:                                                DATE: 09/02/2024 Neuromuscular re-ed: Shoulder isometrics: flexion, abd, ER 5 sec x 10 each Seated scapula retraction 5 sec x 10 Therapeutic Activity: Standing pendulums Pulleys shoulder flexion 10 sec x 10  Pulley scaption 10 sec x 10  Shoulder flexion pushing rolling walker forward  Isometric abduction; ER; extension red TB x 10 PT providing resistance  Manual:  STM R shoulder girdle  Seated shoulder PROM per protocol =  flexion 115 deg, abd 90 deg, ER 45 deg Modalities: Ice pack on Rt shoulder x 10 min Self Care: Review of protocol guidelines  [facility] Adult PT Treatment:                                                DATE: 08/28/2024 Neuromuscular re-ed: Shoulder isometrics: flexion, abd, ER Seated scapula retraction Therapeutic Activity: Standing pendulums Pulleys shoulder flexion 10x10 Seated shoulder PROM per protocol to tolerance --> flexion, abd, ER Modalities: Ice pack on Rt shoulder x 10 min Self Care: Review of protocol guidelines   [facility] Adult PT Treatment:                                                 DATE: 08/21/24 Therapeutic Exercise: Standing  Pendulum circles CW/CCW Sitting  Pulley flexion 10 sec x 10  Manual Therapy: STM R shoulder girdle  Neuromuscular re-ed: Scap squeeze with noodle sitting 10 sec x 10  Therapeutic Activity: Sitting  ER with cane sitting 10 sec x 10  Shoulder flexion assisted with rolling walker  Standing  Isometric abduction 5 sec x 10  Isometric ER 5 sec x 10   Modalities:  Self Care: Encouraged consistent exercise at home     PATIENT EDUCATION: Education details: POC; HEP  Person educated: Patient Education method: Programmer, multimedia, Facilities manager, Actor cues, Verbal cues, and Handouts Education comprehension: verbalized understanding, returned demonstration, verbal cues required, tactile cues required, and needs further education  HOME EXERCISE PROGRAM: Access Code: [email or web address]  - exercises from prior treatment following L reverse TSA - exercises issued today noted in bold type  URL: [email or web address] Date: 08/19/2024 Prepared by: Crosby Oriordan  Exercises - Circular Shoulder Pendulum with Table Support  - 3-4 x daily - 7 x weekly - 1 sets - 20-30 reps - Seated Shoulder Flexion AAROM with Pulley Behind  - 2 x daily - 7 x weekly - 1 sets - 10 reps - 10 sec  hold - Seated Shoulder Scaption AAROM with Pulley at Side  - 2 x daily - 7 x weekly - 1 sets - 10 reps - 10sec  hold - Seated Scapular Retraction  - 2 x daily - 7 x weekly - 1-2 sets - 10 reps - 10 sec  hold - Seated Bilateral Shoulder Flexion Towel Slide at Table Top  - 2 x daily - 7 x weekly - 1 sets - 5-10 reps - 10 sec  hold - Seated Shoulder External Rotation AAROM with Cane and Hand in Neutral  - 2 x daily - 7 x weekly - 1 sets - 5-10 reps - 5-10 sec  hold - Standing Isometric Shoulder Abduction with Doorway - Arm Bent  - 2 x daily - 7 x weekly - 1 sets - 5-10 reps - 5 sec  hold - Standing Isometric Shoulder External Rotation with Doorway  - 2 x  daily - 7 x weekly - 1 sets - 5-10 reps - 5 sec  hold  ASSESSMENT:  CLINICAL IMPRESSION: Patient reports soreness but minimal to no pain in the R shoulder. Continued with shoulder isometrics adding resisted isometrics red TB with PT providing resistance. PROM is ~ equal to protocol with no exacerbation of pain. Progressing well toward goals of therapy.   Eval: Patient is a 78 y.o. female who was seen today for physical therapy evaluation and treatment s/p R reverse TSA 07/05/24. She presents with poor posture and alignment; limited shoulder ROM, strength, function; pain on an intermittent basis; limited functional and ADL activity tolerance. Patient will benefit from PT to address problems identified.   OBJECTIVE IMPAIRMENTS: Abnormal gait, decreased activity tolerance, decreased balance, decreased mobility, decreased ROM, decreased strength, increased fascial restrictions, increased muscle spasms, impaired UE functional use, improper body mechanics, postural dysfunction, obesity, and pain.    GOALS: Goals reviewed with patient? Yes  SHORT TERM GOALS: Target date: 09/16/2024  Independent in initial HEP  Baseline: Goal status: INITIAL  2.  Patient reports ability to cur food with minimal to no difficulty  Baseline:  Goal status: INITIAL   LONG TERM GOALS: Target date: 10/14/2024  Patient demonstrates increase in AROM R shoulder to equal or greater than AROM L shoulder  Baseline:  Goal status: INITIAL  2.  Patient demonstrates 3/5 to 4/5 strength to use R UE for functional activities  Baseline:  Goal status: INITIAL  3.  Patient reports ability to fasten and unfasten seatbelt in care with minimal difficulty  Baseline:  Goal status: INITIAL  4.  Patient demonstrates ability to reach behind back and to head level for personal care  Baseline:  Goal status: INITIAL  5.  INCREASE PSFS: THE PATIENT SPECIFIC FUNCTIONAL SCALE SCORE BY 2 POINTS   Place score of 0-10 (0 = unable to  perform activity and 10 = able to perform activity at the same level as before injury or problem)  Activity Date: 08/19/24    Reaching back or behind back  0    2. Reaching across body to put seat belt on 1    3. Reaching to top of head  6    4. Cutting food  5    Total Score 12      Total Score = Sum of activity scores/number of activities 12/4 = 3  Minimally Detectable Change: 3 points (for single activity); 2 points (for average score) Baseline: 3 Goal status: INITIAL  6.  Independent in advanced HEP  Baseline:  Goal status: INITIAL  PLAN:  PT FREQUENCY: 2x/week  PT DURATION: 8 weeks  PLANNED INTERVENTIONS: 97164- PT Re-evaluation, 97110-Therapeutic exercises, 97530- Therapeutic activity, 97112- Neuromuscular re-education, 97535- Self Care, [id number]- Manual therapy, Patient/Family education, Taping, and Joint mobilization  PLAN FOR NEXT SESSION: Phase 2: shoulder isometrics, PROM flex - 115, abd 90, ER - 45; pulleys to tolerance; pendulums. No aggressive push on ROM. Progress AAROM and AROM in all planes.    Jalene Lacko SHAUNNA Baptist, PT 09/02/2024, 1:07 PM

## 2024-09-04 ENCOUNTER — Ambulatory Visit: Attending: Orthopedic Surgery | Admitting: Rehabilitative and Restorative Service Providers"

## 2024-09-04 ENCOUNTER — Encounter: Payer: Self-pay | Admitting: Rehabilitative and Restorative Service Providers"

## 2024-09-04 DIAGNOSIS — R293 Abnormal posture: Secondary | ICD-10-CM | POA: Diagnosis present

## 2024-09-04 DIAGNOSIS — M6281 Muscle weakness (generalized): Secondary | ICD-10-CM | POA: Insufficient documentation

## 2024-09-04 DIAGNOSIS — R29898 Other symptoms and signs involving the musculoskeletal system: Secondary | ICD-10-CM | POA: Diagnosis present

## 2024-09-04 DIAGNOSIS — M25511 Pain in right shoulder: Secondary | ICD-10-CM | POA: Insufficient documentation

## 2024-09-04 DIAGNOSIS — G8929 Other chronic pain: Secondary | ICD-10-CM | POA: Diagnosis present

## 2024-09-04 NOTE — Therapy (Signed)
 OUTPATIENT PHYSICAL THERAPY SHOULDER TREATMENT   Patient Name: Brooke Duncan MRN: [id number] DOB:1946/05/22, 78 y.o., female Today's Date: 09/04/2024  END OF SESSION:  PT End of Session - 09/04/24 1404     Visit Number 5    Number of Visits 16    Date for Recertification  10/14/24    Authorization Type medicare BCN+BS supplement    Progress Note Due on Visit 10    PT Start Time 1400    PT Stop Time 1450    PT Time Calculation (min) 50 min    Activity Tolerance Patient tolerated treatment well          Past Medical History:  Diagnosis Date   Acquired solitary kidney    right side 2014   Arthritis    rt knee rt shoulder   Bronchitis 05/08/2023   Cancer (HCC)    renal cell carcinoma- left kidney removed and left breast cancer   Complication of anesthesia    slow to wake, hard to take a deep breath with 1 surgery several yrs ago   Foley catheter in place    Frequency of urination    Heart murmur    per told by pcp approx. 02/ 2022 heard a very faint murmur, told did need work-up done at this time   History of COVID-19 2021   all symptoms resolved   History of Hashimoto thyroiditis    s/p  total thyroidectomy 1995   History of hypercalcemia    s/p  parathryoidectomy 07/ 2021   History of kidney stones    History of renal cell carcinoma 2014   s/p  left nephrectomy, pt stated no other treatment   Hypertension    followed by pcp   Hypothyroidism, postsurgical 1995   followed by pcp in [place], moved from [place]  01/ 2021   OSA on CPAP    Thickened endometrium    UTI (urinary tract infection)    finished keflex  08-12-2022   Wears glasses    Past Surgical History:  Procedure Laterality Date   BREAST BIOPSY Left 08/24/2021   BREAST LUMPECTOMY WITH RADIOACTIVE SEED LOCALIZATION Left 07/14/2022   Procedure: LEFT BREAST LUMPECTOMY WITH RADIOACTIVE SEED LOCALIZATION;  Surgeon: Ebbie Cough, MD;  Location: [facility];  Service: General;   Laterality: Left;   COLON SURGERY     10/2022 at [facility]   CYSTOSCOPY  08/04/2022   in dr winter office   CYSTOSCOPY WITH BIOPSY N/A 09/02/2022   Procedure: CYSTOSCOPY WITH BLADDER BIOPSY;  Surgeon: Devere Lonni Righter, MD;  Location: [facility];  Service: Urology;  Laterality: N/A;  ONLY NEEDS 30 MIN   HYSTEROSCOPY WITH D & C N/A 04/08/2021   Procedure: DILATATION AND CURETTAGE /HYSTEROSCOPY, EXCISION OF VAGINAL LESION;  Surgeon: Darcel Pool, MD;  Location: [facility];  Service: Gynecology;  Laterality: N/A;   LAPAROSCOPIC CHOLECYSTECTOMY  2005   NEPHRECTOMY Left 2014   PARATHYROIDECTOMY  07/ 2021  in [place]    REVERSE SHOULDER ARTHROPLASTY Left 06/16/2023   Procedure: REVERSE SHOULDER ARTHROPLASTY;  Surgeon: Kay Kemps, MD;  Location: [facility];  Service: Orthopedics;  Laterality: Left;   REVERSE SHOULDER ARTHROPLASTY Right 07/05/2024   Procedure: ARTHROPLASTY, SHOULDER, TOTAL, REVERSE;  Surgeon: Kay Kemps, MD;  Location: [facility];  Service: Orthopedics;  Laterality: Right;   TONSILLECTOMY  age 60   TOTAL HIP ARTHROPLASTY Bilateral left 2015;  right 2009   TOTAL KNEE ARTHROPLASTY Left 2014   TOTAL THYROIDECTOMY Bilateral 1995  TRIGGER FINGER RELEASE Right 01/24/2024   Procedure: RELEASE TRIGGER FINGER/A-1 PULLEY MIDDLE AND RING;  Surgeon: Romona Harari, MD;  Location: [facility];  Service: Orthopedics;  Laterality: Right;  local   WISDOM TOOTH EXTRACTION Bilateral    Patient Active Problem List   Diagnosis Date Noted   Primary hypertension 07/09/2024   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) 07/09/2024   OSA (obstructive sleep apnea) 07/09/2024   Demand ischemia of myocardium (HCC) 07/09/2024   NSTEMI (non-ST elevated myocardial infarction) (HCC) 07/08/2024   S/P shoulder replacement, right 07/05/2024   H/O total shoulder replacement, left 06/16/2023   Colonic mass 11/03/2022   Malignant neoplasm of  upper-outer quadrant of left breast in female, estrogen receptor positive (HCC) 08/27/2021   Abnormal ultrasound of endometrium 04/08/2021    PCP: Dr Carlin Dale Gull  REFERRING PROVIDER: Dr Marcey Her   REFERRING DIAG: R reverse total shoulder   THERAPY DIAG:  Other symptoms and signs involving the musculoskeletal system  Muscle weakness (generalized)  Chronic right shoulder pain  Abnormal posture  Rationale for Evaluation and Treatment: Rehabilitation  ONSET DATE: 07/05/24  SUBJECTIVE:                                                                                                                                                                                      SUBJECTIVE STATEMENT: Patient reports that her shoulder is doing well. She is working on exercises at home.   Eval: Patient underwent R reverse total shoulder 07/05/24 but ended up back in the hospital 07/08/24 with difficulty breathing. She was diagnosed with slightly enlarged heart. She will be followed by cardiology for cardiac problems. She has had cellulitis for R LE. She continues to have problems with retaining fluids in bilat LE's, R > L which is bing followed by PCP, Dr Gull. She will have a reflux vascular test 11/26/24.  Her R shoulder is doing well. She has done better with the R shoulder replacement than the L TSA. She is 6 weeks 3 days post op. Xrays at follow up last week looked good     Hand dominance: Right  PERTINENT HISTORY: Chronic LBP; Arthritis; HTN; gall bladder removed; L TKA 2014; L posterior hip pain; L shoulder pain; L reverse TSA 06/25/23; L breast cancer continues on estrogen inhibitor treatment; chronic foley catheter   PAIN:  Are you having pain? Yes: NPRS scale: 0/10; best 0/10  Pain location: R shoulder  Pain description:  sore    Aggravating factors: using arm  Relieving factors: rubbing, ice   PRECAUTIONS: Shoulder reverse total shoulder    WEIGHT BEARING RESTRICTIONS: No;  caution with lifting  FALLS:  Has patient fallen in last 6 months? No  LIVING ENVIRONMENT: Lives with: lives alone Lives in: House/apartment Stairs: Yes: External: 1 steps; none Has following equipment at home: Vannie - 4 wheeled, Shower bench, bed side commode, and Grab bars  OCCUPATION: Retired from office work  Household chores; cooking; Pharmacologist; reading; tv   PATIENT GOALS:more ROM in R shoulder; strength R UE  NEXT MD VISIT: 09/24/24  OBJECTIVE:  Note: Objective measures were completed at Evaluation unless otherwise noted.  DIAGNOSTIC FINDINGS:  Xray R shoulder 07/06/23: Status post reverse right shoulder replacement with normal alignment. Gas in the soft tissues consistent with recent surgery. Mild AC joint degenerative change   IMPRESSION: Status post reverse right shoulder replacement with expected postsurgical change.  PATIENT SURVEYS:  PSFS: THE PATIENT SPECIFIC FUNCTIONAL SCALE  Place score of 0-10 (0 = unable to perform activity and 10 = able to perform activity at the same level as before injury or problem)  Activity Date: 08/19/24    Reaching back or behind back  0    2. Reaching across body to put seat belt on 1    3. Reaching to top of head  6    4. Cutting food  5    Total Score 12      Total Score = Sum of activity scores/number of activities 12/4 = 3  Minimally Detectable Change: 3 points (for single activity); 2 points (for average score)  [facility] (nd). The Patient Specific Functional Scale . Retrieved from [email or web address]    POSTURE: Head forward; shoulders rounded and elevated; head of the humerus anterior in orientation  UPPER EXTREMITY ROM:   Active ROM Right eval Left eval  Shoulder flexion 94 122  Shoulder extension    Shoulder abduction 98 137  Shoulder adduction    Shoulder internal rotation Lateral hip  Buttocks   Shoulder external rotation 30 30  Elbow  flexion    Elbow extension    Wrist flexion    Wrist extension    Wrist ulnar deviation    Wrist radial deviation    Wrist pronation    Wrist supination    (Blank rows = not tested)  UPPER EXTREMITY MMT: not tested resistively due to surgery   MMT Right eval Left eval  Shoulder flexion    Shoulder extension    Shoulder abduction    Shoulder adduction    Shoulder internal rotation    Shoulder external rotation    Middle trapezius    Lower trapezius    Elbow flexion    Elbow extension    Wrist flexion    Wrist extension    Wrist ulnar deviation    Wrist radial deviation    Wrist pronation    Wrist supination    Grip strength (lbs)    (Blank rows = not tested)  PALPATION:  Muscular tightness R shoulder girdle - upper trap, leveator; pecs; deltoid; biceps     [facility] Adult PT Treatment:                                                DATE: 09/04/2024 Neuromuscular re-ed: Shoulder isometrics: flexion, abd, ER 5 sec x 10 each Seated scapula retraction 5 sec x 10 Therapeutic Activity: Standing pendulums Pulleys shoulder flexion 10 sec x 10  Pulley scaption 10 sec x 10  Shoulder flexion pushing rolling walker forward  Istonic row; extension; ER red TB x 10   Therapeutic exercise: Isometrics standing 5 sec x 10 abduction, ER, flexion, extension   Manual:  STM R shoulder girdle focus on anterior shoulder specifically pecs and biceps  Modalities: Moist heat on Rt shoulder x 10 min Self Care:    [facility] Adult PT Treatment:                                                DATE: 09/02/2024 Neuromuscular re-ed: Shoulder isometrics: flexion, abd, ER 5 sec x 10 each Seated scapula retraction with coregeous ball 5 sec x 10 Therapeutic Activity: Standing pendulums Pulleys shoulder flexion 10 sec x 10  Pulley scaption 10 sec x 10  Shoulder flexion pushing rolling walker forward  Isometric abduction; ER; extension red TB x 10 PT providing resistance  Manual:  STM R shoulder  girdle  Seated shoulder PROM per protocol =  flexion 115 deg, abd 90 deg, ER 45 deg Modalities: Ice pack on Rt shoulder x 10 min Self Care: Review of protocol guidelines   PATIENT EDUCATION: Education details: POC; HEP  Person educated: Patient Education method: Explanation, Demonstration, Tactile cues, Verbal cues, and Handouts Education comprehension: verbalized understanding, returned demonstration, verbal cues required, tactile cues required, and needs further education  HOME EXERCISE PROGRAM: Access Code: [email or web address] URL: [email or web address] Date: 09/04/2024 Prepared by: Lloyd Ayo  Exercises - Circular Shoulder Pendulum with Table Support  - 3-4 x daily - 7 x weekly - 1 sets - 20-30 reps - Seated Shoulder Flexion AAROM with Pulley Behind  - 2 x daily - 7 x weekly - 1 sets - 10 reps - 10 sec  hold - Seated Shoulder Scaption AAROM with Pulley at Side  - 2 x daily - 7 x weekly - 1 sets - 10 reps - 10sec  hold - Seated Scapular Retraction  - 2 x daily - 7 x weekly - 1-2 sets - 10 reps - 10 sec  hold - Seated Bilateral Shoulder Flexion Towel Slide at Table Top  - 2 x daily - 7 x weekly - 1 sets - 5-10 reps - 10 sec  hold - Seated Shoulder External Rotation AAROM with Cane and Hand in Neutral  - 2 x daily - 7 x weekly - 1 sets - 5-10 reps - 5-10 sec  hold - Isometric Shoulder Extension at Wall  - 2 x daily - 7 x weekly - 1 sets - 5-10 reps - 5 sec  hold - Isometric Shoulder Flexion at Wall  - 2 x daily - 7 x weekly - 1 sets - 5-10 reps - 5 sec  hold - Standing Isometric Shoulder Abduction with Doorway - Arm Bent  - 2 x daily - 7 x weekly - 1 sets - 5-10 reps - 5 sec  hold - Standing Isometric Shoulder External Rotation with Doorway  - 2 x daily - 7 x weekly - 1 sets - 5-10 reps - 5 sec  hold - Standing Isometric Shoulder Internal Rotation at Doorway  - 2 x daily - 7 x weekly - 1 sets - 5-10 reps - 5 sec  hold - Standing Bilateral Low Shoulder Row with Anchored Resistance   - 1-2 x daily - 7 x weekly - 1-2 sets - 10 reps - 2-3 sec  hold -  Shoulder extension with resistance - Neutral  - 1-2 x daily - 7 x weekly - 1-2 sets - 10 reps - 3-5 sec  hold - Shoulder External Rotation and Scapular Retraction with Resistance  - 2 x daily - 7 x weekly - 1 sets - 10 reps - 3-5 sec  hold - Standing Shoulder Flexion to 90 Degrees  - 2 x daily - 7 x weekly - 1 sets - 10 reps - 3 sec  hold - Supine Shoulder Rhythmic Stabilization- Horizontal Abduction/Adduction  - 2 x daily - 7 x weekly - 1 sets - 10-20 reps    ASSESSMENT:  CLINICAL IMPRESSION: Patient reports soreness but minimal to no pain in the R shoulder.  Added isotonic TB exercises with rowing, shoulder extension, ER patient sitting which were tolerated well. Noted muscular tightness in R pecs/anterior shoulder following treatment today. Addressed with manual work to patient tolerance followed by moist heat. Patient will hold on isotonic TB exercises until soreness subsides.    Eval: Patient is a 78 y.o. female who was seen today for physical therapy evaluation and treatment s/p R reverse TSA 07/05/24. She presents with poor posture and alignment; limited shoulder ROM, strength, function; pain on an intermittent basis; limited functional and ADL activity tolerance. Patient will benefit from PT to address problems identified.   OBJECTIVE IMPAIRMENTS: Abnormal gait, decreased activity tolerance, decreased balance, decreased mobility, decreased ROM, decreased strength, increased fascial restrictions, increased muscle spasms, impaired UE functional use, improper body mechanics, postural dysfunction, obesity, and pain.    GOALS: Goals reviewed with patient? Yes  SHORT TERM GOALS: Target date: 09/16/2024  Independent in initial HEP  Baseline: Goal status: INITIAL  2.  Patient reports ability to cur food with minimal to no difficulty  Baseline:  Goal status: INITIAL   LONG TERM GOALS: Target date: 10/14/2024  Patient  demonstrates increase in AROM R shoulder to equal or greater than AROM L shoulder  Baseline:  Goal status: INITIAL  2.  Patient demonstrates 3/5 to 4/5 strength to use R UE for functional activities  Baseline:  Goal status: INITIAL  3.  Patient reports ability to fasten and unfasten seatbelt in care with minimal difficulty  Baseline:  Goal status: INITIAL  4.  Patient demonstrates ability to reach behind back and to head level for personal care  Baseline:  Goal status: INITIAL  5.  INCREASE PSFS: THE PATIENT SPECIFIC FUNCTIONAL SCALE SCORE BY 2 POINTS   Place score of 0-10 (0 = unable to perform activity and 10 = able to perform activity at the same level as before injury or problem)  Activity Date: 08/19/24    Reaching back or behind back  0    2. Reaching across body to put seat belt on 1    3. Reaching to top of head  6    4. Cutting food  5    Total Score 12      Total Score = Sum of activity scores/number of activities 12/4 = 3  Minimally Detectable Change: 3 points (for single activity); 2 points (for average score) Baseline: 3 Goal status: INITIAL  6.  Independent in advanced HEP  Baseline:  Goal status: INITIAL  PLAN:  PT FREQUENCY: 2x/week  PT DURATION: 8 weeks  PLANNED INTERVENTIONS: 97164- PT Re-evaluation, 97110-Therapeutic exercises, 97530- Therapeutic activity, 97112- Neuromuscular re-education, 97535- Self Care, [id number]- Manual therapy, Patient/Family education, Taping, and Joint mobilization  PLAN FOR NEXT SESSION: Phase 2: shoulder isometrics, PROM flex - 115, abd  90, ER - 45; pulleys to tolerance; pendulums. No aggressive push on ROM. Progress AAROM and AROM in all planes.    Kavitha Lansdale P Glema Takaki, PT 09/04/2024, 4:03 PM

## 2024-09-07 ENCOUNTER — Encounter (HOSPITAL_BASED_OUTPATIENT_CLINIC_OR_DEPARTMENT_OTHER): Payer: Self-pay

## 2024-09-07 DIAGNOSIS — I10 Essential (primary) hypertension: Secondary | ICD-10-CM

## 2024-09-07 DIAGNOSIS — Z5181 Encounter for therapeutic drug level monitoring: Secondary | ICD-10-CM

## 2024-09-09 ENCOUNTER — Ambulatory Visit: Admitting: Rehabilitative and Restorative Service Providers"

## 2024-09-11 ENCOUNTER — Ambulatory Visit: Admitting: Rehabilitative and Restorative Service Providers"

## 2024-09-11 NOTE — Telephone Encounter (Signed)
 Pt called in for an update on this message. Please advise.

## 2024-09-11 NOTE — Telephone Encounter (Signed)
 Called spoke with patient   Will reach out to Kenzie E NP tomorrow

## 2024-09-12 MED ORDER — HYDROCHLOROTHIAZIDE 25 MG PO TABS: 25.0000 mg | ORAL_TABLET | Freq: Every day | ORAL | 3 refills | Status: AC

## 2024-09-25 ENCOUNTER — Ambulatory Visit: Payer: Self-pay | Admitting: Emergency Medicine

## 2024-09-25 LAB — BASIC METABOLIC PANEL WITH GFR
BUN/Creatinine Ratio: 22 (ref 12–28)
BUN: 17 mg/dL (ref 8–27)
CO2: 25 mmol/L (ref 20–29)
Calcium: 9.8 mg/dL (ref 8.7–10.3)
Chloride: 99 mmol/L (ref 96–106)
Creatinine, Ser: 0.76 mg/dL (ref 0.57–1.00)
Glucose: 98 mg/dL (ref 70–99)
Potassium: 4.2 mmol/L (ref 3.5–5.2)
Sodium: 140 mmol/L (ref 134–144)
eGFR: 81 mL/min/1.73 (ref >59)

## 2024-09-27 ENCOUNTER — Ambulatory Visit
Admission: RE | Admit: 2024-09-27 | Discharge: 2024-09-27 | Disposition: A | Discharge status: Home / Self Care | Source: Ambulatory Visit | Attending: Hematology and Oncology | Admitting: Hematology and Oncology

## 2024-09-27 DIAGNOSIS — Z9889 Other specified postprocedural states: Secondary | ICD-10-CM

## 2024-10-04 ENCOUNTER — Ambulatory Visit (HOSPITAL_BASED_OUTPATIENT_CLINIC_OR_DEPARTMENT_OTHER): Admitting: Cardiology

## 2024-10-04 ENCOUNTER — Other Ambulatory Visit: Payer: Self-pay | Admitting: Vascular Surgery

## 2024-10-04 DIAGNOSIS — M7989 Other specified soft tissue disorders: Secondary | ICD-10-CM

## 2024-10-07 ENCOUNTER — Inpatient Hospital Stay: Attending: Hematology and Oncology | Admitting: Hematology and Oncology

## 2024-10-07 VITALS — BP 138/78 | HR 75 | Temp 97.8°F | Resp 18 | Ht 66.0 in | Wt 253.5 lb

## 2024-10-07 DIAGNOSIS — Z96611 Presence of right artificial shoulder joint: Secondary | ICD-10-CM | POA: Diagnosis not present

## 2024-10-07 DIAGNOSIS — N951 Menopausal and female climacteric states: Secondary | ICD-10-CM | POA: Diagnosis not present

## 2024-10-07 DIAGNOSIS — M8580 Other specified disorders of bone density and structure, unspecified site: Secondary | ICD-10-CM | POA: Diagnosis not present

## 2024-10-07 DIAGNOSIS — Z780 Asymptomatic menopausal state: Secondary | ICD-10-CM | POA: Diagnosis not present

## 2024-10-07 DIAGNOSIS — Z79899 Other long term (current) drug therapy: Secondary | ICD-10-CM | POA: Insufficient documentation

## 2024-10-07 DIAGNOSIS — Z170 Estrogen receptor positive status [ER+]: Secondary | ICD-10-CM | POA: Diagnosis not present

## 2024-10-07 DIAGNOSIS — Z79811 Long term (current) use of aromatase inhibitors: Secondary | ICD-10-CM | POA: Diagnosis not present

## 2024-10-07 DIAGNOSIS — Z96612 Presence of left artificial shoulder joint: Secondary | ICD-10-CM | POA: Insufficient documentation

## 2024-10-07 DIAGNOSIS — Z1721 Progesterone receptor positive status: Secondary | ICD-10-CM | POA: Diagnosis not present

## 2024-10-07 DIAGNOSIS — C50412 Malignant neoplasm of upper-outer quadrant of left female breast: Secondary | ICD-10-CM | POA: Insufficient documentation

## 2024-10-07 DIAGNOSIS — Z1732 Human epidermal growth factor receptor 2 negative status: Secondary | ICD-10-CM | POA: Insufficient documentation

## 2024-10-07 NOTE — Assessment & Plan Note (Signed)
 08/24/2021: Screening mammogram revealed left breast asymmetry at 2 o'clock position measuring 1.6 cm.  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  MRI detected 2 additional masses measuring 5.2 cm and 2.4 cm (need biopsied) no lymphadenopathy   Recommendations:  1.  Neoadjuvant antiestrogen therapy with letrozole  (until additional biopsies were performed) 2. 07/14/2022: Left lumpectomy: No residual cancer identified.  All margins are negative 3. Oncotype DX: 21 (7% distant recurrence at 9 years) 4.  Radiation omitted because of shoulder issues 5. Adjuvant antiestrogen therapy Letrozole  started 01/09/23 ---------------------------------------------------------------------------------------------------------------------------------------- Letrozole  toxicities: Mild hot flashes   Patient underwent a left shoulder replacement surgery and she is doing very well.  She think she might need a right shoulder replacement surgery.   Breast cancer surveillance: Breast exam 10/07/2024: Benign Mammogram 09/27/2024: Benign breast density category B  bone density 09/21/2023: Osteopenia T-score -1.4: Recommend calcium and vitamin D    Return to clinic in 1 year for follow-up

## 2024-10-07 NOTE — Progress Notes (Signed)
 Patient Care Team: Okey Carlin Redbird, MD as PCP - General (Family Medicine) Lonni Slain, MD as PCP - Cardiology Tyree Nanetta SAILOR, RN as Oncology Nurse Navigator Ebbie Cough, MD as Consulting Physician (General Surgery) Odean Potts, MD as Consulting Physician (Hematology and Oncology) Shannon Agent, MD as Consulting Physician (Radiation Oncology)  DIAGNOSIS:  Encounter Diagnosis  Name Primary?   Malignant neoplasm of upper-outer quadrant of left breast in female, estrogen receptor positive (HCC) Yes    SUMMARY OF ONCOLOGIC HISTORY: Oncology History  Malignant neoplasm of upper-outer quadrant of left breast in female, estrogen receptor positive (HCC)  08/24/2021 Initial Diagnosis   MRI Breast: a triangular shaped area of enhancement measuring 4.4 x 2.2 x 1.8 cm, a linear area of low level non mass enhancement spanning 5.2 x 1.8 x 0.9 cm, and smaller rounded area of non mass enhancement spanning 2.3 x 2.4 x 1.6 cm in the left breast. 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   08/25/2021 -  Anti-estrogen oral therapy   Neoadjuvant letrozole    09/01/2021 Cancer Staging   Staging form: Breast, AJCC 8th Edition - Clinical stage from 09/01/2021: Stage IIA (cT3, cN0, cM0, G2, ER+, PR+, HER2-) - Signed by Odean Potts, MD on 09/01/2021 Stage prefix: Initial diagnosis Histologic grading system: 3 grade system     CHIEF COMPLIANT:   HISTORY OF PRESENT ILLNESS: Discussed the use of AI scribe software for clinical note transcription with the patient, who gave verbal consent to proceed.  History of Present Illness Brooke Duncan is a 78 year old female with a history of breast cancer who presents for a follow-up visit.  She has been on letrozole  for over three years for estrogen receptor-positive breast cancer. She experiences occasional hot flashes and joint stiffness, likely related to the  medication and her arthritis. There is no breast pain or discomfort. A recent mammogram indicated a B density.  Her bone density was last assessed in 2024, showing mild osteopenia.     ALLERGIES:  is allergic to wound dressing adhesive, norvasc  [amlodipine ], and tape.  MEDICATIONS:  Current Outpatient Medications  Medication Sig Dispense Refill   acetaminophen  (TYLENOL ) 500 MG tablet Take 2 tablets (1,000 mg total) by mouth every 6 (six) hours as needed. 30 tablet 0   albuterol  (VENTOLIN  HFA) 108 (90 Base) MCG/ACT inhaler Inhale 2 puffs into the lungs every 4 (four) hours as needed for shortness of breath or wheezing.     Ascorbic Acid  (VITAMIN C  PO) Take 1 tablet by mouth daily.     Cholecalciferol  (VITAMIN D3 SUPER STRENGTH) 50 MCG (2000 UT) CAPS Take 2,000 Units by mouth daily.     clotrimazole  (LOTRIMIN ) 1 % cream Apply 1 application  topically 2 (two) times daily as needed (irritation).     CRANBERRY PO Take 2 capsules by mouth daily.     D-MANNOSE PO Take 4 capsules by mouth daily.     furosemide  (LASIX ) 40 MG tablet Take 1 tablet (40 mg total) by mouth daily as needed. Take for more than 5 pounds weight gain or lower extremity edema 30 tablet 0   hydrochlorothiazide (HYDRODIURIL) 25 MG tablet Take 1 tablet (25 mg total) by mouth daily. 90 tablet 3   HYDROcodone -acetaminophen  (NORCO/VICODIN) 5-325 MG tablet Take 1 tablet by mouth every 6 (six) hours as needed for moderate pain (pain score 4-6) or severe pain (pain score 7-10).     hydrocortisone  2.5 % cream Apply 1 Application  topically 2 (two) times daily as needed (irritation).     letrozole  (FEMARA ) 2.5 MG tablet Take 1 tablet (2.5 mg total) by mouth daily. 90 tablet 3   levothyroxine  (SYNTHROID ) 150 MCG tablet Take 150 mcg by mouth daily before breakfast.     losartan  (COZAAR ) 100 MG tablet Take 100 mg by mouth daily.     nystatin  cream (MYCOSTATIN ) Apply topically 2 (two) times daily. 30 g 0   Probiotic Product (PROBIOTIC DAILY  PO) Take 1 capsule by mouth daily.     simvastatin  (ZOCOR ) 10 MG tablet Take 10 mg by mouth at bedtime.     vitamin B-12 (CYANOCOBALAMIN ) 1000 MCG tablet Take 1,000 mcg by mouth daily.     traMADol  (ULTRAM ) 50 MG tablet Take 1 tablet (50 mg total) by mouth every 6 (six) hours as needed. 20 tablet 0   No current facility-administered medications for this visit.    PHYSICAL EXAMINATION: ECOG PERFORMANCE STATUS: 1 - Symptomatic but completely ambulatory  Vitals:   10/07/24 1035  BP: 138/78  Pulse: 75  Resp: 18  Temp: 97.8 F (36.6 C)  SpO2: 100%   Filed Weights   10/07/24 1035  Weight: 253 lb 8 oz (115 kg)    LABORATORY DATA:  I have reviewed the data as listed    Latest Ref Rng & Units 09/25/2024    9:07 AM 07/10/2024    5:07 AM 07/09/2024    3:15 AM  CMP  Glucose 70 - 99 mg/dL 98  893  894   BUN 8 - 27 mg/dL 17  16  15    Creatinine 0.57 - 1.00 mg/dL 9.23  9.25  9.64   Sodium 134 - 144 mmol/L 140  134  136   Potassium 3.5 - 5.2 mmol/L 4.2  4.3  4.0   Chloride 96 - 106 mmol/L 99  96  98   CO2 20 - 29 mmol/L 25  28  28    Calcium 8.7 - 10.3 mg/dL 9.8  8.8  8.9     Lab Results  Component Value Date   WBC 10.5 07/09/2024   HGB 13.0 07/09/2024   HCT 43.3 07/09/2024   MCV 90.6 07/09/2024   PLT 136 (L) 07/09/2024   NEUTROABS 7.6 07/08/2024    ASSESSMENT & PLAN:  Malignant neoplasm of upper-outer quadrant of left breast in female, estrogen receptor positive (HCC) 08/24/2021: Screening mammogram revealed left breast asymmetry at 2 o'clock position measuring 1.6 cm.  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  MRI detected 2 additional masses measuring 5.2 cm and 2.4 cm (need biopsied) no lymphadenopathy   Recommendations:  1.  Neoadjuvant antiestrogen therapy with letrozole  (until additional biopsies were performed) 2. 07/14/2022: Left lumpectomy: No  residual cancer identified.  All margins are negative 3. Oncotype DX: 21 (7% distant recurrence at 9 years) 4.  Radiation omitted because of shoulder issues 5. Adjuvant antiestrogen therapy Letrozole  started September 2023 as neoadjuvant therapy ---------------------------------------------------------------------------------------------------------------------------------------- Letrozole  toxicities: Mild hot flashes Chronic osteoarthritis: Therefore she cannot tell if any issues are happening with letrozole  in terms of musculoskeletal aches or pains.   Patient underwent bilateral shoulder replacements and she is doing well with that. 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  Breast cancer surveillance: Mammogram 09/27/2024: Benign breast density category B  bone density 09/21/2023: Osteopenia T-score -1.4: Recommend calcium  and vitamin D    Return to clinic in 1 year for follow-up   No orders of the defined types were placed in this encounter.  The patient has a good understanding of the overall plan. she agrees with it. she will call with any problems that may develop before the next visit here.  I personally spent a total of 30 minutes in the care of the patient today including preparing to see the patient, getting/reviewing separately obtained history, performing a medically appropriate exam/evaluation, counseling and educating, placing orders, referring and communicating with other health care professionals, documenting clinical information in the EHR, independently interpreting results, communicating results, and coordinating care.   Viinay K Pius Byrom, MD 10/07/24

## 2024-10-08 ENCOUNTER — Ambulatory Visit: Attending: Cardiovascular Disease | Admitting: Rehabilitative and Restorative Service Providers"

## 2024-10-08 ENCOUNTER — Encounter: Payer: Self-pay | Admitting: Rehabilitative and Restorative Service Providers"

## 2024-10-08 DIAGNOSIS — R29898 Other symptoms and signs involving the musculoskeletal system: Secondary | ICD-10-CM | POA: Insufficient documentation

## 2024-10-08 DIAGNOSIS — M25511 Pain in right shoulder: Secondary | ICD-10-CM | POA: Insufficient documentation

## 2024-10-08 DIAGNOSIS — R293 Abnormal posture: Secondary | ICD-10-CM | POA: Insufficient documentation

## 2024-10-08 DIAGNOSIS — G8929 Other chronic pain: Secondary | ICD-10-CM | POA: Insufficient documentation

## 2024-10-08 DIAGNOSIS — M6281 Muscle weakness (generalized): Secondary | ICD-10-CM | POA: Insufficient documentation
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
## 2024-10-16 ENCOUNTER — Encounter: Payer: Self-pay | Admitting: Rehabilitative and Restorative Service Providers"

## 2024-10-16 ENCOUNTER — Ambulatory Visit: Payer: Self-pay | Admitting: Rehabilitative and Restorative Service Providers"

## 2024-10-16 DIAGNOSIS — M6281 Muscle weakness (generalized): Secondary | ICD-10-CM | POA: Diagnosis present

## 2024-10-16 DIAGNOSIS — G8929 Other chronic pain: Secondary | ICD-10-CM

## 2024-10-16 DIAGNOSIS — R29898 Other symptoms and signs involving the musculoskeletal system: Secondary | ICD-10-CM | POA: Diagnosis present

## 2024-10-16 DIAGNOSIS — M25511 Pain in right shoulder: Secondary | ICD-10-CM | POA: Diagnosis present

## 2024-10-16 DIAGNOSIS — R293 Abnormal posture: Secondary | ICD-10-CM

## 2024-10-16 NOTE — Therapy (Signed)
 OUTPATIENT PHYSICAL THERAPY SHOULDER TREATMENT   Patient Name: Brooke Duncan MRN: [id number] DOB:02/12/1946, 78 y.o., female Today's Date: 10/16/2024  END OF SESSION:  PT End of Session - 10/16/24 1106     Visit Number 7    Number of Visits 16    Date for Recertification  12/03/24    Authorization Type medicare BCN+BS supplement    Progress Note Due on Visit 10    PT Start Time 1100    PT Stop Time 1145    PT Time Calculation (min) 45 min    Activity Tolerance Patient tolerated treatment well          Past Medical History:  Diagnosis Date   Acquired solitary kidney    right side 2014   Arthritis    rt knee rt shoulder   Bronchitis 05/08/2023   Cancer (HCC)    renal cell carcinoma- left kidney removed and left breast cancer   Complication of anesthesia    slow to wake, hard to take a deep breath with 1 surgery several yrs ago   Foley catheter in place    Frequency of urination    Heart murmur    per told by pcp approx. 02/ 2022 heard a very faint murmur, told did need work-up done at this time   History of COVID-19 2021   all symptoms resolved   History of Hashimoto thyroiditis    s/p  total thyroidectomy 1995   History of hypercalcemia    s/p  parathryoidectomy 07/ 2021   History of kidney stones    History of renal cell carcinoma 2014   s/p  left nephrectomy, pt stated no other treatment   Hypertension    followed by pcp   Hypothyroidism, postsurgical 1995   followed by pcp in [place], moved from [place]  01/ 2021   OSA on CPAP    Thickened endometrium    UTI (urinary tract infection)    finished keflex  08-12-2022   Wears glasses    Past Surgical History:  Procedure Laterality Date   BREAST BIOPSY Left 08/24/2021   BREAST LUMPECTOMY WITH RADIOACTIVE SEED LOCALIZATION Left 07/14/2022   Procedure: LEFT BREAST LUMPECTOMY WITH RADIOACTIVE SEED LOCALIZATION;  Surgeon: Ebbie Cough, MD;  Location: [facility];  Service: General;   Laterality: Left;   COLON SURGERY     10/2022 at [facility]   CYSTOSCOPY  08/04/2022   in dr winter office   CYSTOSCOPY WITH BIOPSY N/A 09/02/2022   Procedure: CYSTOSCOPY WITH BLADDER BIOPSY;  Surgeon: Devere Lonni Righter, MD;  Location: [facility];  Service: Urology;  Laterality: N/A;  ONLY NEEDS 30 MIN   HYSTEROSCOPY WITH D & C N/A 04/08/2021   Procedure: DILATATION AND CURETTAGE /HYSTEROSCOPY, EXCISION OF VAGINAL LESION;  Surgeon: Darcel Pool, MD;  Location: [facility];  Service: Gynecology;  Laterality: N/A;   LAPAROSCOPIC CHOLECYSTECTOMY  2005   NEPHRECTOMY Left 2014   PARATHYROIDECTOMY  07/ 2021  in [place]    REVERSE SHOULDER ARTHROPLASTY Left 06/16/2023   Procedure: REVERSE SHOULDER ARTHROPLASTY;  Surgeon: Kay Kemps, MD;  Location: [facility];  Service: Orthopedics;  Laterality: Left;   REVERSE SHOULDER ARTHROPLASTY Right 07/05/2024   Procedure: ARTHROPLASTY, SHOULDER, TOTAL, REVERSE;  Surgeon: Kay Kemps, MD;  Location: [facility];  Service: Orthopedics;  Laterality: Right;   TONSILLECTOMY  age 10   TOTAL HIP ARTHROPLASTY Bilateral left 2015;  right 2009   TOTAL KNEE ARTHROPLASTY Left 2014   TOTAL THYROIDECTOMY Bilateral 1995  TRIGGER FINGER RELEASE Right 01/24/2024   Procedure: RELEASE TRIGGER FINGER/A-1 PULLEY MIDDLE AND RING;  Surgeon: Romona Harari, MD;  Location: [facility];  Service: Orthopedics;  Laterality: Right;  local   WISDOM TOOTH EXTRACTION Bilateral    Patient Active Problem List   Diagnosis Date Noted   Primary hypertension 07/09/2024   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) 07/09/2024   OSA (obstructive sleep apnea) 07/09/2024   Demand ischemia of myocardium (HCC) 07/09/2024   NSTEMI (non-ST elevated myocardial infarction) (HCC) 07/08/2024   S/P shoulder replacement, right 07/05/2024   H/O total shoulder replacement, left 06/16/2023   Colonic mass 11/03/2022   Malignant neoplasm of  upper-outer quadrant of left breast in female, estrogen receptor positive (HCC) 08/27/2021   Abnormal ultrasound of endometrium 04/08/2021    PCP: Dr Carlin Dale Gull  REFERRING PROVIDER: Dr Marcey Her   REFERRING DIAG: R reverse total shoulder   THERAPY DIAG:  Other symptoms and signs involving the musculoskeletal system  Muscle weakness (generalized)  Chronic right shoulder pain  Abnormal posture  Rationale for Evaluation and Treatment: Rehabilitation  ONSET DATE: 07/05/24  SUBJECTIVE:                                                                                                                                                                                      SUBJECTIVE STATEMENT: Patient reports that her shoulder is feeling good. She has no problems with exercises. Ready for her to work on IR/bringing hand behind back. MD was pleased with her progress and will not see her again until January.   Eval: Patient underwent R reverse total shoulder 07/05/24 but ended up back in the hospital 07/08/24 with difficulty breathing. She was diagnosed with slightly enlarged heart. She will be followed by cardiology for cardiac problems. She has had cellulitis for R LE. She continues to have problems with retaining fluids in bilat LE's, R > L which is bing followed by PCP, Dr Gull. She will have a reflux vascular test 11/26/24.  Her R shoulder is doing well. She has done better with the R shoulder replacement than the L TSA. She is 6 weeks 3 days post op. Xrays at follow up last week looked good     Hand dominance: Right  PERTINENT HISTORY: Chronic LBP; Arthritis; HTN; gall bladder removed; L TKA 2014; L posterior hip pain; L shoulder pain; L reverse TSA 06/25/23; L breast cancer continues on estrogen inhibitor treatment; chronic foley catheter   PAIN:  Are you having pain? Yes: NPRS scale: 0/10; best 0/10  Pain location: R shoulder  Pain description:  sore    Aggravating factors:  using  arm  Relieving factors: rubbing, ice   PRECAUTIONS: Shoulder reverse total shoulder    WEIGHT BEARING RESTRICTIONS: No; caution with lifting   FALLS:  Has patient fallen in last 6 months? No  LIVING ENVIRONMENT: Lives with: lives alone Lives in: House/apartment Stairs: Yes: External: 1 steps; none Has following equipment at home: Vannie - 4 wheeled, Shower bench, bed side commode, and Grab bars  OCCUPATION: Retired from office work  Household chores; cooking; pharmacologist; reading; tv   PATIENT GOALS:more ROM in R shoulder; strength R UE  NEXT MD VISIT: 09/24/24  OBJECTIVE:  Note: Objective measures were completed at Evaluation unless otherwise noted.  DIAGNOSTIC FINDINGS:  Xray R shoulder 07/06/23: Status post reverse right shoulder replacement with normal alignment. Gas in the soft tissues consistent with recent surgery. Mild AC joint degenerative change   IMPRESSION: Status post reverse right shoulder replacement with expected postsurgical change.  PATIENT SURVEYS:  PSFS: THE PATIENT SPECIFIC FUNCTIONAL SCALE  Place score of 0-10 (0 = unable to perform activity and 10 = able to perform activity at the same level as before injury or problem)  Activity Date: 08/19/24 Date:  10/08/24   Reaching back or behind back  0 1   2. Reaching across body to put seat belt on 1 8   3. Reaching to top of head  6 10   4. Cutting food  5 10   Total Score 12 19     Total Score = Sum of activity scores/number of activities 12/4 = 3  10/08/24: 4.75   Minimally Detectable Change: 3 points (for single activity); 2 points (for average score)  [facility] (nd). The Patient Specific Functional Scale . Retrieved from [email or web address]    POSTURE: Head forward; shoulders rounded and elevated; head of the humerus anterior in orientation  UPPER EXTREMITY ROM:   Active ROM Right eval Right  10/08/24  Left eval   Shoulder flexion 94 122 122  Shoulder extension     Shoulder abduction 98 112 137  Shoulder adduction     Shoulder internal rotation Lateral hip  Lateral hip Buttocks   Shoulder external rotation 30 54 30  Elbow flexion     Elbow extension     Wrist flexion     Wrist extension     Wrist ulnar deviation     Wrist radial deviation     Wrist pronation     Wrist supination     (Blank rows = not tested)  UPPER EXTREMITY MMT: not tested resistively due to surgery   MMT Right eval Left eval  Shoulder flexion    Shoulder extension    Shoulder abduction    Shoulder adduction    Shoulder internal rotation    Shoulder external rotation    Middle trapezius    Lower trapezius    Elbow flexion    Elbow extension    Wrist flexion    Wrist extension    Wrist ulnar deviation    Wrist radial deviation    Wrist pronation    Wrist supination    Grip strength (lbs)    (Blank rows = not tested)  PALPATION:  Muscular tightness R shoulder girdle - upper trap, leveator; pecs; deltoid; biceps     [facility] Adult PT Treatment:  DATE: 10/16/2024 Neuromuscular re-ed: Shoulder isometrics: flexion, abd, ER 5 sec x 10 each Seated scapula retraction 5 sec x 10 Therapeutic Activity:  Row green 3 sec x 10  Shoulder extension green TB 3 sec x 10 Shoulder ER red TB 3 sec x 10  Shoulder IR red TB 3 sec x 10  Therapeutic exercise: Shoulder extension with cane 5 sec x 10  Shoulder horizontal abduction with cane to L 5 sec x 5  Shoulder IR with cane 5 sec x 10    Manual:  STM R shoulder girdle focus on anterior shoulder specifically pecs and biceps  P to AAROM R shoulder sitting    [facility] Adult PT Treatment:                                                DATE: 10/08/2024 Neuromuscular re-ed: Shoulder isometrics: flexion, abd, ER 5 sec x 10 each Seated scapula retraction 5 sec x 10 Therapeutic Activity:  Shoulder flexion pushing rolling walker forward   Istonic row; extension; ER red TB x 10   Therapeutic exercise: Shoulder extension with cane 5 sec x 10  Shoulder horizontal abduction with cane to L 5 sec x 5  Shoulder IR with cane 5 sec x 10    Manual:  STM R shoulder girdle focus on anterior shoulder specifically pecs and biceps     PATIENT EDUCATION: Education details: POC; HEP  Person educated: Patient Education method: Programmer, Multimedia, Demonstration, Actor cues, Verbal cues, and Handouts Education comprehension: verbalized understanding, returned demonstration, verbal cues required, tactile cues required, and needs further education  HOME EXERCISE PROGRAM: Access Code: [email or web address] URL: [email or web address] Date: 10/16/2024 Prepared by: Nobel Brar  Exercises - Circular Shoulder Pendulum with Table Support  - 3-4 x daily - 7 x weekly - 1 sets - 20-30 reps - Seated Shoulder Flexion AAROM with Pulley Behind  - 2 x daily - 7 x weekly - 1 sets - 10 reps - 10 sec  hold - Seated Shoulder Scaption AAROM with Pulley at Side  - 2 x daily - 7 x weekly - 1 sets - 10 reps - 10sec  hold - Seated Scapular Retraction  - 2 x daily - 7 x weekly - 1-2 sets - 10 reps - 10 sec  hold - Seated Bilateral Shoulder Flexion Towel Slide at Table Top  - 2 x daily - 7 x weekly - 1 sets - 5-10 reps - 10 sec  hold - Seated Shoulder External Rotation AAROM with Cane and Hand in Neutral  - 2 x daily - 7 x weekly - 1 sets - 5-10 reps - 5-10 sec  hold - Isometric Shoulder Extension at Wall  - 2 x daily - 7 x weekly - 1 sets - 5-10 reps - 5 sec  hold - Isometric Shoulder Flexion at Wall  - 2 x daily - 7 x weekly - 1 sets - 5-10 reps - 5 sec  hold - Standing Isometric Shoulder Abduction with Doorway - Arm Bent  - 2 x daily - 7 x weekly - 1 sets - 5-10 reps - 5 sec  hold - Standing Isometric Shoulder External Rotation with Doorway  - 2 x daily - 7 x weekly - 1 sets - 5-10 reps - 5 sec  hold - Standing Isometric Shoulder Internal Rotation at Doorway  - 2  x  daily - 7 x weekly - 1 sets - 5-10 reps - 5 sec  hold - Standing Bilateral Low Shoulder Row with Anchored Resistance  - 1-2 x daily - 7 x weekly - 1-2 sets - 10 reps - 2-3 sec  hold - Shoulder extension with resistance - Neutral  - 1-2 x daily - 7 x weekly - 1-2 sets - 10 reps - 3-5 sec  hold - Shoulder External Rotation and Scapular Retraction with Resistance  - 1 x daily - 7 x weekly - 1 sets - 10 reps - 3-5 sec  hold - Standing Shoulder Flexion to 90 Degrees  - 1 x daily - 7 x weekly - 1 sets - 10 reps - 3 sec  hold - Supine Shoulder Rhythmic Stabilization- Horizontal Abduction/Adduction  - 1 x daily - 7 x weekly - 1 sets - 10-20 reps - Supine Alternating Shoulder Flexion  - 1 x daily - 7 x weekly - 1-2 sets - 10 reps - 2 sec  hold - Supine Single Arm Shoulder Protraction  - 1 x daily - 7 x weekly - 1 sets - 10-15 reps -   hold - Standing Shoulder Scaption  - 1 x daily - 7 x weekly - 1 sets - 10 reps - 3 sec  hold - Supine Shoulder Rhythmic Stabilization- Horizontal Abduction/Adduction  - 1 x daily - 7 x weekly - 1 sets - 5-10 reps - Shoulder Internal Rotation with Resistance  - 1 x daily - 7 x weekly - 2 sets - 10 reps - 3 sec  hold - Seated Single Arm Elbow Flexion with Resistance  - 1 x daily - 7 x weekly - 1-3 sets - 10 reps - 2-3 sec  hold - Standing Shoulder Extension with Dowel  - 2 x daily - 7 x weekly - 1 sets - 5-10 reps - 3-5 sec  hold - Standing Shoulder Internal Rotation AAROM with Dowel  - 1 x daily - 7 x weekly - 1 sets - 5-10 reps - 3 sec  hold - Standing Bilateral Shoulder Internal Rotation AAROM with Dowel  - 1 x daily - 7 x weekly - 1 sets - 5-10 reps - 5 sec  hold - Shoulder External Rotation with Anchored Resistance  - 2 x daily - 7 x weekly - 1-2 sets - 10 reps - 3 sec  hold - Shoulder Internal Rotation with Resistance  - 2 x daily - 7 x weekly - 2 sets - 10 reps - 3 sec  hold    ASSESSMENT:  CLINICAL IMPRESSION: Patient reports continued progress with shoulder  rehab. She has some soreness but minimal to no pain in the R shoulder. [name] has pain with lying on either side due to history of bilat THA. She has been working on her exercises at home on a regular basis. Continued work on [employer] and added strengthening exercises with TB. Patient tolerated treatment well.    Eval: Patient is a 78 y.o. female who was seen today for physical therapy evaluation and treatment s/p R reverse TSA 07/05/24. She presents with poor posture and alignment; limited shoulder ROM, strength, function; pain on an intermittent basis; limited functional and ADL activity tolerance. Patient will benefit from PT to address problems identified.   OBJECTIVE IMPAIRMENTS: Abnormal gait, decreased activity tolerance, decreased balance, decreased mobility, decreased ROM, decreased strength, increased fascial restrictions, increased muscle spasms, impaired UE functional use, improper body mechanics, postural dysfunction, obesity, and pain.  GOALS: Goals reviewed with patient? Yes  SHORT TERM GOALS: Target date: 09/16/2024  Independent in initial HEP  Baseline: Goal status: met  2.  Patient reports ability to cut food with minimal to no difficulty  Baseline:  Goal status: met   LONG TERM GOALS: Target date: 12/03/2024  Patient demonstrates increase in AROM R shoulder to equal or greater than AROM L shoulder  Baseline:  Goal status: on going   2.  Patient demonstrates 3/5 to 4/5 strength to use R UE for functional activities  Baseline:  Goal status: on going   3.  Patient reports ability to fasten and unfasten seatbelt in care with minimal difficulty  Baseline:  Goal status: met  4.  Patient demonstrates ability to reach behind back and to head level for personal care  Baseline:  Goal status: on going  5.  INCREASE PSFS: THE PATIENT SPECIFIC FUNCTIONAL SCALE SCORE BY 2 POINTS   Place score of 0-10 (0 = unable to perform activity and 10 = able to perform activity at the same  level as before injury or problem)  Activity Date: 08/19/24 10/08/24   Reaching back or behind back  0 1   2. Reaching across body to put seat belt on 1 8   3. Reaching to top of head  6 10   4. Cutting food  5 10   Total Score 12 19     Total Score = Sum of activity scores/number of activities 12/4 = 3 10/08/24: 4.75  Minimally Detectable Change: 3 points (for single activity); 2 points (for average score) Baseline: 3 Goal status: met  6.  Independent in advanced HEP  Baseline:  Goal status: on going   PLAN:  PT FREQUENCY: 2x/week  PT DURATION: 8 weeks  PLANNED INTERVENTIONS: 97164- PT Re-evaluation, 97110-Therapeutic exercises, 97530- Therapeutic activity, 97112- Neuromuscular re-education, 97535- Self Care, [id number]- Manual therapy, Patient/Family education, Taping, and Joint mobilization  PLAN FOR NEXT SESSION: Phase 3: continue with phase 2; shoulder isometrics, progress PROM and AROM to tolerance; pulleys to tolerance;  Progress AAROM and AROM in all planes. Progress strengthening, gradually progress TB and resistive exercises positions as tolerated    Harrell Niehoff P Vernadine Coombs, PT 10/16/2024, 11:06 AM

## 2024-10-24 ENCOUNTER — Encounter: Payer: Self-pay | Admitting: Rehabilitative and Restorative Service Providers"

## 2024-10-24 ENCOUNTER — Ambulatory Visit: Payer: Self-pay | Admitting: Rehabilitative and Restorative Service Providers"

## 2024-10-24 DIAGNOSIS — R293 Abnormal posture: Secondary | ICD-10-CM

## 2024-10-24 DIAGNOSIS — M6281 Muscle weakness (generalized): Secondary | ICD-10-CM

## 2024-10-24 DIAGNOSIS — R29898 Other symptoms and signs involving the musculoskeletal system: Secondary | ICD-10-CM

## 2024-10-24 DIAGNOSIS — G8929 Other chronic pain: Secondary | ICD-10-CM

## 2024-10-24 NOTE — Therapy (Addendum)
 " OUTPATIENT PHYSICAL THERAPY SHOULDER TREATMENT PHYSICAL THERAPY DISCHARGE SUMMARY  Visits from Start of Care: 8  Current functional level related to goals / functional outcomes: See progress note for discharge status    Remaining deficits: Needs to continue with HEP    Education / Equipment: HEP    Patient agrees to discharge. Patient goals were partially met. Patient is being discharged due to not returning since the last visit.  Brycen Bean P. Ina PT, MPH 11/25/2024 1:37 PM     Patient Name: Brooke Duncan MRN: [id number] DOB:1946/06/12, 78 y.o., female Today's Date: 10/24/2024  END OF SESSION:  PT End of Session - 10/24/24 1528     Visit Number 8    Number of Visits 16    Date for Recertification  12/03/24    Authorization Type medicare BCN+BS supplement    Progress Note Due on Visit 10    PT Start Time 1529    PT Stop Time 1615    PT Time Calculation (min) 46 min    Activity Tolerance Patient tolerated treatment well          Past Medical History:  Diagnosis Date   Acquired solitary kidney    right side 2014   Arthritis    rt knee rt shoulder   Bronchitis 05/08/2023   Cancer (HCC)    renal cell carcinoma- left kidney removed and left breast cancer   Complication of anesthesia    slow to wake, hard to take a deep breath with 1 surgery several yrs ago   Foley catheter in place    Frequency of urination    Heart murmur    per told by pcp approx. 02/ 2022 heard a very faint murmur, told did need work-up done at this time   History of COVID-19 2021   all symptoms resolved   History of Hashimoto thyroiditis    s/p  total thyroidectomy 1995   History of hypercalcemia    s/p  parathryoidectomy 07/ 2021   History of kidney stones    History of renal cell carcinoma 2014   s/p  left nephrectomy, pt stated no other treatment   Hypertension    followed by pcp   Hypothyroidism, postsurgical 1995   followed by pcp in [place], moved from [place]  01/ 2021   OSA  on CPAP    Thickened endometrium    UTI (urinary tract infection)    finished keflex  08-12-2022   Wears glasses    Past Surgical History:  Procedure Laterality Date   BREAST BIOPSY Left 08/24/2021   BREAST LUMPECTOMY WITH RADIOACTIVE SEED LOCALIZATION Left 07/14/2022   Procedure: LEFT BREAST LUMPECTOMY WITH RADIOACTIVE SEED LOCALIZATION;  Surgeon: Ebbie Cough, MD;  Location: [facility];  Service: General;  Laterality: Left;   COLON SURGERY     10/2022 at [facility]   CYSTOSCOPY  08/04/2022   in dr winter office   CYSTOSCOPY WITH BIOPSY N/A 09/02/2022   Procedure: CYSTOSCOPY WITH BLADDER BIOPSY;  Surgeon: Devere Lonni Righter, MD;  Location: [facility];  Service: Urology;  Laterality: N/A;  ONLY NEEDS 30 MIN   HYSTEROSCOPY WITH D & C N/A 04/08/2021   Procedure: DILATATION AND CURETTAGE /HYSTEROSCOPY, EXCISION OF VAGINAL LESION;  Surgeon: Darcel Pool, MD;  Location:  SURGERY CENTER;  Service: Gynecology;  Laterality: N/A;   LAPAROSCOPIC CHOLECYSTECTOMY  2005   NEPHRECTOMY Left 2014   PARATHYROIDECTOMY  07/ 2021  in [place]    REVERSE SHOULDER ARTHROPLASTY Left 06/16/2023  Procedure: REVERSE SHOULDER ARTHROPLASTY;  Surgeon: Kay Kemps, MD;  Location: [facility];  Service: Orthopedics;  Laterality: Left;   REVERSE SHOULDER ARTHROPLASTY Right 07/05/2024   Procedure: ARTHROPLASTY, SHOULDER, TOTAL, REVERSE;  Surgeon: Kay Kemps, MD;  Location: [facility];  Service: Orthopedics;  Laterality: Right;   TONSILLECTOMY  age 55   TOTAL HIP ARTHROPLASTY Bilateral left 2015;  right 2009   TOTAL KNEE ARTHROPLASTY Left 2014   TOTAL THYROIDECTOMY Bilateral 1995   TRIGGER FINGER RELEASE Right 01/24/2024   Procedure: RELEASE TRIGGER FINGER/A-1 PULLEY MIDDLE AND RING;  Surgeon: Romona Harari, MD;  Location: [facility];  Service: Orthopedics;  Laterality: Right;  local   WISDOM TOOTH EXTRACTION Bilateral    Patient Active Problem  List   Diagnosis Date Noted   Primary hypertension 07/09/2024   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) 07/09/2024   OSA (obstructive sleep apnea) 07/09/2024   Demand ischemia of myocardium (HCC) 07/09/2024   NSTEMI (non-ST elevated myocardial infarction) (HCC) 07/08/2024   S/P shoulder replacement, right 07/05/2024   H/O total shoulder replacement, left 06/16/2023   Colonic mass 11/03/2022   Malignant neoplasm of upper-outer quadrant of left breast in female, estrogen receptor positive (HCC) 08/27/2021   Abnormal ultrasound of endometrium 04/08/2021    PCP: Dr Carlin Dale Gull  REFERRING PROVIDER: Dr Kemps Kay   REFERRING DIAG: R reverse total shoulder   THERAPY DIAG:  Other symptoms and signs involving the musculoskeletal system  Muscle weakness (generalized)  Chronic right shoulder pain  Abnormal posture  Rationale for Evaluation and Treatment: Rehabilitation  ONSET DATE: 07/05/24  SUBJECTIVE:                                                                                                                                                                                      SUBJECTIVE STATEMENT: Patient reports that her shoulder is doing well. She has no problems with exercises. A little soreness but no problem.  Ready for her to work on IR/bringing hand behind back. MD was pleased with her progress and will not see her again until January.   Eval: Patient underwent R reverse total shoulder 07/05/24 but ended up back in the hospital 07/08/24 with difficulty breathing. She was diagnosed with slightly enlarged heart. She will be followed by cardiology for cardiac problems. She has had cellulitis for R LE. She continues to have problems with retaining fluids in bilat LE's, R > L which is bing followed by PCP, Dr Gull. She will have a reflux vascular test 11/26/24.  Her R shoulder is doing well. She has done better with the R shoulder replacement than the L TSA. She is  6  weeks 3 days post op. Xrays at follow up last week looked good     Hand dominance: Right  PERTINENT HISTORY: Chronic LBP; Arthritis; HTN; gall bladder removed; L TKA 2014; L posterior hip pain; L shoulder pain; L reverse TSA 06/25/23; L breast cancer continues on estrogen inhibitor treatment; chronic foley catheter   PAIN:  Are you having pain? Yes: NPRS scale: 0/10; best 0/10  Pain location: R shoulder  Pain description:  sore    Aggravating factors: using arm  Relieving factors: rubbing, ice   PRECAUTIONS: Shoulder reverse total shoulder    WEIGHT BEARING RESTRICTIONS: No; caution with lifting   FALLS:  Has patient fallen in last 6 months? No  LIVING ENVIRONMENT: Lives with: lives alone Lives in: House/apartment Stairs: Yes: External: 1 steps; none Has following equipment at home: Vannie - 4 wheeled, Shower bench, bed side commode, and Grab bars  OCCUPATION: Retired from office work  Household chores; cooking; pharmacologist; reading; tv   PATIENT GOALS:more ROM in R shoulder; strength R UE  NEXT MD VISIT: 09/24/24  OBJECTIVE:  Note: Objective measures were completed at Evaluation unless otherwise noted.  DIAGNOSTIC FINDINGS:  Xray R shoulder 07/06/23: Status post reverse right shoulder replacement with normal alignment. Gas in the soft tissues consistent with recent surgery. Mild AC joint degenerative change   IMPRESSION: Status post reverse right shoulder replacement with expected postsurgical change.  PATIENT SURVEYS:  PSFS: THE PATIENT SPECIFIC FUNCTIONAL SCALE  Place score of 0-10 (0 = unable to perform activity and 10 = able to perform activity at the same level as before injury or problem)  Activity Date: 08/19/24 Date:  10/08/24   Reaching back or behind back  0 1   2. Reaching across body to put seat belt on 1 8   3. Reaching to top of head  6 10   4. Cutting food  5 10   Total Score 12 19     Total Score = Sum of activity scores/number of activities  12/4 = 3  10/08/24: 4.75   Minimally Detectable Change: 3 points (for single activity); 2 points (for average score)  [facility] (nd). The Patient Specific Functional Scale . Retrieved from [email or web address]    POSTURE: Head forward; shoulders rounded and elevated; head of the humerus anterior in orientation  UPPER EXTREMITY ROM:   Active ROM Right eval Right  10/08/24  Left eval  Shoulder flexion 94 122 122  Shoulder extension     Shoulder abduction 98 112 137  Shoulder adduction     Shoulder internal rotation Lateral hip  Lateral hip Buttocks   Shoulder external rotation 30 54 30  Elbow flexion     Elbow extension     Wrist flexion     Wrist extension     Wrist ulnar deviation     Wrist radial deviation     Wrist pronation     Wrist supination     (Blank rows = not tested)  UPPER EXTREMITY MMT: not tested resistively due to surgery   MMT Right eval Left eval  Shoulder flexion    Shoulder extension    Shoulder abduction    Shoulder adduction    Shoulder internal rotation    Shoulder external rotation    Middle trapezius    Lower trapezius    Elbow flexion    Elbow extension    Wrist flexion    Wrist extension    Wrist ulnar deviation  Wrist radial deviation    Wrist pronation    Wrist supination    Grip strength (lbs)    (Blank rows = not tested)  PALPATION:  Muscular tightness R shoulder girdle - upper trap, leveator; pecs; deltoid; biceps     [facility] Adult PT Treatment:                                                DATE: 10/24/2024 Neuromuscular re-ed: Shoulder isometrics: flexion, abd, ER 5 sec x 10 each Seated scapula retraction 5 sec x 10 Therapeutic Activity:  Row green TB 3 sec x 10  High row green TB 3 sec x 10  Shoulder extension green TB 3 sec x 10 Shoulder ER green TB 3 sec x 10  Shoulder IR green TB 3 sec x 10  Shoulder adduction at side green TB 3 sec x 10  Lat pull  green TB 3 sec x10  Therapeutic exercise: Shoulder extension with cane 5 sec x 10  Shoulder horizontal abduction with cane to L 5 sec x 10  Shoulder IR with cane 5 sec x 10   Shoulder ITR with strap 3-5 sec x 10   Manual:  STM R shoulder girdle focus on anterior shoulder specifically pecs and biceps     [facility] Adult PT Treatment:                                                DATE: 10/16/2024 Neuromuscular re-ed: Shoulder isometrics: flexion, abd, ER 5 sec x 10 each Seated scapula retraction 5 sec x 10 Therapeutic Activity:  Row green 3 sec x 10  Shoulder extension green TB 3 sec x 10 Shoulder ER red TB 3 sec x 10  Shoulder IR red TB 3 sec x 10  Therapeutic exercise: Shoulder extension with cane 5 sec x 10  Shoulder horizontal abduction with cane to L 5 sec x 5  Shoulder IR with cane 5 sec x 10    Manual:  STM R shoulder girdle focus on anterior shoulder specifically pecs and biceps  P to AAROM R shoulder sitting    PATIENT EDUCATION: Education details: POC; HEP  Person educated: Patient Education method: Programmer, Multimedia, Demonstration, Actor cues, Verbal cues, and Handouts Education comprehension: verbalized understanding, returned demonstration, verbal cues required, tactile cues required, and needs further education  HOME EXERCISE PROGRAM: Access Code: [email or web address] URL: [email or web address] Date: 10/24/2024 Prepared by: Mariateresa Batra  Exercises - Circular Shoulder Pendulum with Table Support  - 3-4 x daily - 7 x weekly - 1 sets - 20-30 reps - Seated Shoulder Flexion AAROM with Pulley Behind  - 2 x daily - 7 x weekly - 1 sets - 10 reps - 10 sec  hold - Seated Shoulder Scaption AAROM with Pulley at Side  - 2 x daily - 7 x weekly - 1 sets - 10 reps - 10sec  hold - Seated Scapular Retraction  - 2 x daily - 7 x weekly - 1-2 sets - 10 reps - 10 sec  hold - Seated Bilateral Shoulder Flexion Towel Slide at Table Top  - 2 x daily - 7 x weekly - 1 sets - 5-10 reps - 10  sec  hold - Seated Shoulder  External Rotation AAROM with Cane and Hand in Neutral  - 2 x daily - 7 x weekly - 1 sets - 5-10 reps - 5-10 sec  hold - Isometric Shoulder Extension at Wall  - 2 x daily - 7 x weekly - 1 sets - 5-10 reps - 5 sec  hold - Isometric Shoulder Flexion at Wall  - 2 x daily - 7 x weekly - 1 sets - 5-10 reps - 5 sec  hold - Standing Isometric Shoulder Abduction with Doorway - Arm Bent  - 2 x daily - 7 x weekly - 1 sets - 5-10 reps - 5 sec  hold - Standing Isometric Shoulder External Rotation with Doorway  - 2 x daily - 7 x weekly - 1 sets - 5-10 reps - 5 sec  hold - Standing Isometric Shoulder Internal Rotation at Doorway  - 2 x daily - 7 x weekly - 1 sets - 5-10 reps - 5 sec  hold - Standing Bilateral Low Shoulder Row with Anchored Resistance  - 1-2 x daily - 7 x weekly - 1-2 sets - 10 reps - 2-3 sec  hold - Shoulder extension with resistance - Neutral  - 1-2 x daily - 7 x weekly - 1-2 sets - 10 reps - 3-5 sec  hold - Shoulder External Rotation and Scapular Retraction with Resistance  - 1 x daily - 7 x weekly - 1 sets - 10 reps - 3-5 sec  hold - Standing Shoulder Flexion to 90 Degrees  - 1 x daily - 7 x weekly - 1 sets - 10 reps - 3 sec  hold - Supine Shoulder Rhythmic Stabilization- Horizontal Abduction/Adduction  - 1 x daily - 7 x weekly - 1 sets - 10-20 reps - Supine Alternating Shoulder Flexion  - 1 x daily - 7 x weekly - 1-2 sets - 10 reps - 2 sec  hold - Supine Single Arm Shoulder Protraction  - 1 x daily - 7 x weekly - 1 sets - 10-15 reps -   hold - Standing Shoulder Scaption  - 1 x daily - 7 x weekly - 1 sets - 10 reps - 3 sec  hold - Supine Shoulder Rhythmic Stabilization- Horizontal Abduction/Adduction  - 1 x daily - 7 x weekly - 1 sets - 5-10 reps - Shoulder Internal Rotation with Resistance  - 1 x daily - 7 x weekly - 2 sets - 10 reps - 3 sec  hold - Seated Single Arm Elbow Flexion with Resistance  - 1 x daily - 7 x weekly - 1-3 sets - 10 reps - 2-3 sec  hold -  Standing Shoulder Extension with Dowel  - 2 x daily - 7 x weekly - 1 sets - 5-10 reps - 3-5 sec  hold - Standing Shoulder Internal Rotation AAROM with Dowel  - 1 x daily - 7 x weekly - 1 sets - 5-10 reps - 3 sec  hold - Standing Bilateral Shoulder Internal Rotation AAROM with Dowel  - 1 x daily - 7 x weekly - 1 sets - 5-10 reps - 5 sec  hold - Shoulder External Rotation with Anchored Resistance  - 2 x daily - 7 x weekly - 1-2 sets - 10 reps - 3 sec  hold - Shoulder Internal Rotation with Resistance  - 2 x daily - 7 x weekly - 2 sets - 10 reps - 3 sec  hold - Standing High Row with Resistance  - 1 x daily -  7 x weekly - 1 sets - 10 reps - 3sec  hold - Standing Shoulder Adduction Reactive Isometrics with Resistance and Elbow Extended  - 1 x daily - 7 x weekly - 1-2 sets - 10 reps - 3 sec  hold    ASSESSMENT:  CLINICAL IMPRESSION: Patient reports continued progress with shoulder rehab.she is using the R UE for most functional activities. She has difficulties reaching behind her back. She has some soreness but minimal to no pain in the R shoulder. [name] has pain with lying on either side due to history of bilat THA. She has been working on her exercises at home on a regular basis. Continued work on [employer] and added strengthening exercises with TB. Patient tolerated treatment well.    Eval: Patient is a 78 y.o. female who was seen today for physical therapy evaluation and treatment s/p R reverse TSA 07/05/24. She presents with poor posture and alignment; limited shoulder ROM, strength, function; pain on an intermittent basis; limited functional and ADL activity tolerance. Patient will benefit from PT to address problems identified.   OBJECTIVE IMPAIRMENTS: Abnormal gait, decreased activity tolerance, decreased balance, decreased mobility, decreased ROM, decreased strength, increased fascial restrictions, increased muscle spasms, impaired UE functional use, improper body mechanics, postural dysfunction, obesity,  and pain.    GOALS: Goals reviewed with patient? Yes  SHORT TERM GOALS: Target date: 09/16/2024  Independent in initial HEP  Baseline: Goal status: met  2.  Patient reports ability to cut food with minimal to no difficulty  Baseline:  Goal status: met   LONG TERM GOALS: Target date: 12/03/2024  Patient demonstrates increase in AROM R shoulder to equal or greater than AROM L shoulder  Baseline:  Goal status: on going   2.  Patient demonstrates 3/5 to 4/5 strength to use R UE for functional activities  Baseline:  Goal status: on going   3.  Patient reports ability to fasten and unfasten seatbelt in care with minimal difficulty  Baseline:  Goal status: met  4.  Patient demonstrates ability to reach behind back and to head level for personal care  Baseline:  Goal status: on going  5.  INCREASE PSFS: THE PATIENT SPECIFIC FUNCTIONAL SCALE SCORE BY 2 POINTS   Place score of 0-10 (0 = unable to perform activity and 10 = able to perform activity at the same level as before injury or problem)  Activity Date: 08/19/24 10/08/24   Reaching back or behind back  0 1   2. Reaching across body to put seat belt on 1 8   3. Reaching to top of head  6 10   4. Cutting food  5 10   Total Score 12 19     Total Score = Sum of activity scores/number of activities 12/4 = 3 10/08/24: 4.75  Minimally Detectable Change: 3 points (for single activity); 2 points (for average score) Baseline: 3 Goal status: met  6.  Independent in advanced HEP  Baseline:  Goal status: on going   PLAN:  PT FREQUENCY: 2x/week  PT DURATION: 8 weeks  PLANNED INTERVENTIONS: 97164- PT Re-evaluation, 97110-Therapeutic exercises, 97530- Therapeutic activity, 97112- Neuromuscular re-education, 97535- Self Care, [id number]- Manual therapy, Patient/Family education, Taping, and Joint mobilization  PLAN FOR NEXT SESSION: Phase 3: continue with phase 2; shoulder isometrics, progress PROM and AROM to tolerance; pulleys  to tolerance;  Progress AAROM and AROM in all planes. Progress strengthening, gradually progress TB and resistive exercises positions as tolerated    Athan Casalino P  Ahuva Poynor, PT 10/24/2024, 3:29 PM   "

## 2024-10-29 ENCOUNTER — Ambulatory Visit: Payer: Self-pay | Admitting: Rehabilitative and Restorative Service Providers"

## 2024-11-05 ENCOUNTER — Ambulatory Visit: Admitting: Rehabilitative and Restorative Service Providers"

## 2024-11-05 NOTE — Therapy (Deleted)
 OUTPATIENT PHYSICAL THERAPY SHOULDER TREATMENT   Patient Name: Brooke Duncan MRN: [id number] DOB:May 13, 1946, 78 y.o., female Today's Date: 11/05/2024  END OF SESSION:    Past Medical History:  Diagnosis Date   Acquired solitary kidney    right side 2014   Arthritis    rt knee rt shoulder   Bronchitis 05/08/2023   Cancer (HCC)    renal cell carcinoma- left kidney removed and left breast cancer   Complication of anesthesia    slow to wake, hard to take a deep breath with 1 surgery several yrs ago   Foley catheter in place    Frequency of urination    Heart murmur    per told by pcp approx. 02/ 2022 heard a very faint murmur, told did need work-up done at this time   History of COVID-19 2021   all symptoms resolved   History of Hashimoto thyroiditis    s/p  total thyroidectomy 1995   History of hypercalcemia    s/p  parathryoidectomy 07/ 2021   History of kidney stones    History of renal cell carcinoma 2014   s/p  left nephrectomy, pt stated no other treatment   Hypertension    followed by pcp   Hypothyroidism, postsurgical 1995   followed by pcp in [place], moved from [place]  01/ 2021   OSA on CPAP    Thickened endometrium    UTI (urinary tract infection)    finished keflex  08-12-2022   Wears glasses    Past Surgical History:  Procedure Laterality Date   BREAST BIOPSY Left 08/24/2021   BREAST LUMPECTOMY WITH RADIOACTIVE SEED LOCALIZATION Left 07/14/2022   Procedure: LEFT BREAST LUMPECTOMY WITH RADIOACTIVE SEED LOCALIZATION;  Surgeon: Ebbie Cough, MD;  Location: [facility];  Service: General;  Laterality: Left;   COLON SURGERY     10/2022 at [facility]   CYSTOSCOPY  08/04/2022   in dr winter office   CYSTOSCOPY WITH BIOPSY N/A 09/02/2022   Procedure: CYSTOSCOPY WITH BLADDER BIOPSY;  Surgeon: Devere Lonni Righter, MD;  Location: [facility];  Service: Urology;  Laterality: N/A;  ONLY NEEDS 30 MIN   HYSTEROSCOPY WITH D &  C N/A 04/08/2021   Procedure: DILATATION AND CURETTAGE /HYSTEROSCOPY, EXCISION OF VAGINAL LESION;  Surgeon: Darcel Pool, MD;  Location: [facility];  Service: Gynecology;  Laterality: N/A;   LAPAROSCOPIC CHOLECYSTECTOMY  2005   NEPHRECTOMY Left 2014   PARATHYROIDECTOMY  07/ 2021  in [place]    REVERSE SHOULDER ARTHROPLASTY Left 06/16/2023   Procedure: REVERSE SHOULDER ARTHROPLASTY;  Surgeon: Kay Kemps, MD;  Location: [facility];  Service: Orthopedics;  Laterality: Left;   REVERSE SHOULDER ARTHROPLASTY Right 07/05/2024   Procedure: ARTHROPLASTY, SHOULDER, TOTAL, REVERSE;  Surgeon: Kay Kemps, MD;  Location: [facility];  Service: Orthopedics;  Laterality: Right;   TONSILLECTOMY  age 78   TOTAL HIP ARTHROPLASTY Bilateral left 2015;  right 2009   TOTAL KNEE ARTHROPLASTY Left 2014   TOTAL THYROIDECTOMY Bilateral 1995   TRIGGER FINGER RELEASE Right 01/24/2024   Procedure: RELEASE TRIGGER FINGER/A-1 PULLEY MIDDLE AND RING;  Surgeon: Romona Harari, MD;  Location: [facility];  Service: Orthopedics;  Laterality: Right;  local   WISDOM TOOTH EXTRACTION Bilateral    Patient Active Problem List   Diagnosis Date Noted   Primary hypertension 07/09/2024   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  Rationale for Evaluation and Treatment: Rehabilitation ONSET DATE:  07/05/24  SUBJECTIVE:                                                                                                                                                                                     SUBJECTIVE STATEMENT: ***Patient reports that her shoulder is doing well. She has no problems with exercises. A little soreness but no problem.  Ready for her to work on IR/bringing hand behind back. MD was pleased with her progress and will not see her again until January.   Eval: Patient underwent R reverse total shoulder 07/05/24 but ended up back in the hospital 07/08/24 with difficulty breathing. She was diagnosed with slightly enlarged heart. She will be followed by cardiology for cardiac problems. She has had cellulitis for R LE. She continues to have problems with retaining fluids in bilat LE's, R > L which is bing followed by PCP, Dr Gull. She will have a reflux vascular test 11/26/24.  Her R shoulder is doing well. She has done better with the R shoulder replacement than the L TSA. She is 6 weeks 3 days post op. Xrays at follow up last week looked good   Hand dominance: Right  PERTINENT HISTORY: Chronic LBP; Arthritis; HTN; gall bladder removed; L TKA 2014; L posterior hip pain; L shoulder pain; L reverse TSA 06/25/23; L breast cancer continues on estrogen inhibitor treatment; chronic foley catheter   PAIN:  Are you having pain? Yes: NPRS scale: 0/10; best 0/10  Pain location: R shoulder  Pain description:  sore    Aggravating factors: using arm  Relieving factors: rubbing, ice   PRECAUTIONS: Shoulder reverse total shoulder   WEIGHT BEARING RESTRICTIONS: No; caution with lifting   FALLS:  Has patient fallen in last 6 months? No  LIVING ENVIRONMENT: Lives with: lives alone Lives in: House/apartment Stairs: Yes: External: 1 steps; none Has following equipment at home: Vannie - 4 wheeled, Shower bench, bed side commode, and Grab bars  OCCUPATION: Retired from office work   Household chores; cooking; pharmacologist; reading; tv   PATIENT GOALS:more ROM in R shoulder; strength R UE  NEXT MD VISIT: 09/24/24  OBJECTIVE:  Note: Objective  measures were completed at Evaluation unless otherwise noted.  DIAGNOSTIC FINDINGS:  Xray R shoulder 07/06/23: Status post reverse right shoulder replacement with normal alignment. Gas in the soft tissues consistent with recent surgery. Mild AC joint degenerative change   IMPRESSION: Status post reverse right shoulder replacement with expected postsurgical change.  PATIENT SURVEYS:  PSFS: THE PATIENT SPECIFIC FUNCTIONAL SCALE  Place score of 0-10 (0 = unable to perform activity and 10 = able to perform activity at the same level as before injury or problem)  Activity Date: 08/19/24 Date:  10/08/24   Reaching back or behind back  0 1   2. Reaching across body to put seat belt on 1 8   3. Reaching to top of head  6 10   4. Cutting food  5 10   Total Score 12 19    Total Score = Sum of activity scores/number of activities 12/4 = 3  10/08/24: 4.75   Minimally Detectable Change: 3 points (for single activity); 2 points (for average score)  [facility] (nd). The Patient Specific Functional Scale . Retrieved from [email or web address]   POSTURE: Head forward; shoulders rounded and elevated; head of the humerus anterior in orientation  UPPER EXTREMITY ROM:   Active ROM Right eval Right  10/08/24  Left eval  Shoulder flexion 94 122 122  Shoulder extension     Shoulder abduction 98 112 137  Shoulder adduction     Shoulder internal rotation Lateral hip  Lateral hip Buttocks   Shoulder external rotation 30 54 30  Elbow flexion     Elbow extension     Wrist flexion     Wrist extension     Wrist ulnar deviation     Wrist radial deviation     Wrist pronation     Wrist supination     (Blank rows = not tested)  UPPER EXTREMITY MMT: not tested resistively due  to surgery   MMT Right eval Left eval  Shoulder flexion    Shoulder extension    Shoulder abduction    Shoulder adduction    Shoulder internal rotation    Shoulder external rotation    Middle trapezius    Lower trapezius    Elbow flexion    Elbow extension    Wrist flexion    Wrist extension    Wrist ulnar deviation    Wrist radial deviation    Wrist pronation    Wrist supination    Grip strength (lbs)    (Blank rows = not tested)  PALPATION:  Muscular tightness R shoulder girdle - upper trap, leveator; pecs; deltoid; biceps     [facility] Adult PT Treatment:                                                DATE: 11/05/24 Therapeutic Exercise: *** Manual Therapy: *** Neuromuscular re-ed: *** Therapeutic Activity: *** Gait: *** Modalities: *** Self Care: ***  [facility] Adult PT Treatment:                                                DATE: 10/24/2024 Neuromuscular re-ed: Shoulder isometrics: flexion, abd, ER 5 sec x 10 each Seated scapula retraction 5 sec x 10  Therapeutic Activity:  Row green TB 3 sec x 10  High row green TB 3 sec x 10  Shoulder extension green TB 3 sec x 10 Shoulder ER green TB 3 sec x 10  Shoulder IR green TB 3 sec x 10  Shoulder adduction at side green TB 3 sec x 10  Lat pull green TB 3 sec x10  Therapeutic exercise: Shoulder extension with cane 5 sec x 10  Shoulder horizontal abduction with cane to L 5 sec x 10  Shoulder IR with cane 5 sec x 10   Shoulder ITR with strap 3-5 sec x 10  Manual:  STM R shoulder girdle focus on anterior shoulder specifically pecs and biceps   [facility] Adult PT Treatment:                                                DATE: 10/16/2024 Neuromuscular re-ed: Shoulder isometrics: flexion, abd, ER 5 sec x 10 each Seated scapula retraction 5 sec x 10 Therapeutic Activity:  Row green 3 sec x 10  Shoulder extension green TB 3 sec x 10 Shoulder ER red TB 3 sec x 10  Shoulder IR red TB 3 sec x 10  Therapeutic  exercise: Shoulder extension with cane 5 sec x 10  Shoulder horizontal abduction with cane to L 5 sec x 5  Shoulder IR with cane 5 sec x 10    Manual:  STM R shoulder girdle focus on anterior shoulder specifically pecs and biceps  P to AAROM R shoulder sitting    PATIENT EDUCATION: Education details: POC; HEP  Person educated: Patient Education method: Programmer, Multimedia, Demonstration, Actor cues, Verbal cues, and Handouts Education comprehension: verbalized understanding, returned demonstration, verbal cues required, tactile cues required, and needs further education  HOME EXERCISE PROGRAM: Access Code: [email or web address] URL: [email or web address] Date: 10/24/2024 Prepared by: Celyn Holt  Exercises - Circular Shoulder Pendulum with Table Support  - 3-4 x daily - 7 x weekly - 1 sets - 20-30 reps - Seated Shoulder Flexion AAROM with Pulley Behind  - 2 x daily - 7 x weekly - 1 sets - 10 reps - 10 sec  hold - Seated Shoulder Scaption AAROM with Pulley at Side  - 2 x daily - 7 x weekly - 1 sets - 10 reps - 10sec  hold - Seated Scapular Retraction  - 2 x daily - 7 x weekly - 1-2 sets - 10 reps - 10 sec  hold - Seated Bilateral Shoulder Flexion Towel Slide at Table Top  - 2 x daily - 7 x weekly - 1 sets - 5-10 reps - 10 sec  hold - Seated Shoulder External Rotation AAROM with Cane and Hand in Neutral  - 2 x daily - 7 x weekly - 1 sets - 5-10 reps - 5-10 sec  hold - Isometric Shoulder Extension at Wall  - 2 x daily - 7 x weekly - 1 sets - 5-10 reps - 5 sec  hold - Isometric Shoulder Flexion at Wall  - 2 x daily - 7 x weekly - 1 sets - 5-10 reps - 5 sec  hold - Standing Isometric Shoulder Abduction with Doorway - Arm Bent  - 2 x daily - 7 x weekly - 1 sets - 5-10 reps - 5 sec  hold - Standing Isometric Shoulder External Rotation with Doorway  - 2  x daily - 7 x weekly - 1 sets - 5-10 reps - 5 sec  hold - Standing Isometric Shoulder Internal Rotation at Doorway  - 2 x daily - 7 x weekly - 1  sets - 5-10 reps - 5 sec  hold - Standing Bilateral Low Shoulder Row with Anchored Resistance  - 1-2 x daily - 7 x weekly - 1-2 sets - 10 reps - 2-3 sec  hold - Shoulder extension with resistance - Neutral  - 1-2 x daily - 7 x weekly - 1-2 sets - 10 reps - 3-5 sec  hold - Shoulder External Rotation and Scapular Retraction with Resistance  - 1 x daily - 7 x weekly - 1 sets - 10 reps - 3-5 sec  hold - Standing Shoulder Flexion to 90 Degrees  - 1 x daily - 7 x weekly - 1 sets - 10 reps - 3 sec  hold - Supine Shoulder Rhythmic Stabilization- Horizontal Abduction/Adduction  - 1 x daily - 7 x weekly - 1 sets - 10-20 reps - Supine Alternating Shoulder Flexion  - 1 x daily - 7 x weekly - 1-2 sets - 10 reps - 2 sec  hold - Supine Single Arm Shoulder Protraction  - 1 x daily - 7 x weekly - 1 sets - 10-15 reps -   hold - Standing Shoulder Scaption  - 1 x daily - 7 x weekly - 1 sets - 10 reps - 3 sec  hold - Supine Shoulder Rhythmic Stabilization- Horizontal Abduction/Adduction  - 1 x daily - 7 x weekly - 1 sets - 5-10 reps - Shoulder Internal Rotation with Resistance  - 1 x daily - 7 x weekly - 2 sets - 10 reps - 3 sec  hold - Seated Single Arm Elbow Flexion with Resistance  - 1 x daily - 7 x weekly - 1-3 sets - 10 reps - 2-3 sec  hold - Standing Shoulder Extension with Dowel  - 2 x daily - 7 x weekly - 1 sets - 5-10 reps - 3-5 sec  hold - Standing Shoulder Internal Rotation AAROM with Dowel  - 1 x daily - 7 x weekly - 1 sets - 5-10 reps - 3 sec  hold - Standing Bilateral Shoulder Internal Rotation AAROM with Dowel  - 1 x daily - 7 x weekly - 1 sets - 5-10 reps - 5 sec  hold - Shoulder External Rotation with Anchored Resistance  - 2 x daily - 7 x weekly - 1-2 sets - 10 reps - 3 sec  hold - Shoulder Internal Rotation with Resistance  - 2 x daily - 7 x weekly - 2 sets - 10 reps - 3 sec  hold - Standing High Row with Resistance  - 1 x daily - 7 x weekly - 1 sets - 10 reps - 3sec  hold - Standing Shoulder  Adduction Reactive Isometrics with Resistance and Elbow Extended  - 1 x daily - 7 x weekly - 1-2 sets - 10 reps - 3 sec  hold  ASSESSMENT: CLINICAL IMPRESSION: ***Patient reports continued progress with shoulder rehab.she is using the R UE for most functional activities. She has difficulties reaching behind her back. She has some soreness but minimal to no pain in the R shoulder. [name] has pain with lying on either side due to history of bilat THA. She has been working on her exercises at home on a regular basis. Continued work on [employer] and added strengthening exercises with TB.  Patient tolerated treatment well.   Eval: Patient is a 78 y.o. female who was seen today for physical therapy evaluation and treatment s/p R reverse TSA 07/05/24. She presents with poor posture and alignment; limited shoulder ROM, strength, function; pain on an intermittent basis; limited functional and ADL activity tolerance. Patient will benefit from PT to address problems identified.   OBJECTIVE IMPAIRMENTS: Abnormal gait, decreased activity tolerance, decreased balance, decreased mobility, decreased ROM, decreased strength, increased fascial restrictions, increased muscle spasms, impaired UE functional use, improper body mechanics, postural dysfunction, obesity, and pain.   GOALS: Goals reviewed with patient? Yes  SHORT TERM GOALS: Target date: 09/16/2024  Independent in initial HEP  Baseline: Goal status: met  2.  Patient reports ability to cut food with minimal to no difficulty  Baseline:  Goal status: met  LONG TERM GOALS: Target date: 12/03/2024  Patient demonstrates increase in AROM R shoulder to equal or greater than AROM L shoulder  Baseline:  Goal status: on going   2.  Patient demonstrates 3/5 to 4/5 strength to use R UE for functional activities  Baseline:  Goal status: on going   3.  Patient reports ability to fasten and unfasten seatbelt in care with minimal difficulty  Baseline:  Goal status:  met  4.  Patient demonstrates ability to reach behind back and to head level for personal care  Baseline:  Goal status: on going  5.  INCREASE PSFS: THE PATIENT SPECIFIC FUNCTIONAL SCALE SCORE BY 2 POINTS   Place score of 0-10 (0 = unable to perform activity and 10 = able to perform activity at the same level as before injury or problem)  Activity Date: 08/19/24 10/08/24   Reaching back or behind back  0 1   2. Reaching across body to put seat belt on 1 8   3. Reaching to top of head  6 10   4. Cutting food  5 10   Total Score 12 19   Total Score = Sum of activity scores/number of activities 12/4 = 3 10/08/24: 4.75  Minimally Detectable Change: 3 points (for single activity); 2 points (for average score) Baseline: 3 Goal status: met  6.  Independent in advanced HEP  Baseline:  Goal status: on going   PLAN:  PT FREQUENCY: 2x/week  PT DURATION: 8 weeks  PLANNED INTERVENTIONS: 97164- PT Re-evaluation, 97110-Therapeutic exercises, 97530- Therapeutic activity, 97112- Neuromuscular re-education, 97535- Self Care, 02859- Manual therapy, Patient/Family education, Taping, and Joint mobilization  PLAN FOR NEXT SESSION: Phase 3: continue with phase 2; shoulder isometrics, progress PROM and AROM to tolerance; pulleys to tolerance;  Progress AAROM and AROM in all planes. Progress strengthening, gradually progress TB and resistive exercises positions as tolerated ***   Rehana Uncapher, PT 11/05/2024, 8:55 AM

## 2024-11-26 ENCOUNTER — Encounter (HOSPITAL_COMMUNITY)

## 2024-11-26 ENCOUNTER — Encounter

## 2024-12-09 ENCOUNTER — Ambulatory Visit (HOSPITAL_BASED_OUTPATIENT_CLINIC_OR_DEPARTMENT_OTHER): Admitting: Cardiology

## 2024-12-09 NOTE — Progress Notes (Incomplete)
" °  Cardiology Office Note:  .   Date:  12/09/2024  ID:  Brooke Duncan, DOB [birth year], MRN [id number] PCP: Okey Carlin Redbird, MD  [facility] Providers Cardiologist:  Shelda Bruckner, MD {  History of Present Illness: .   Brooke Duncan is a 79 y.o. female with PMH breast cancer, hypertension, OSA not on CPAP, renal cell carcinoma s/p nephrectomy. She was previously seen by [facility] and established care with me on 12/09/24.  Pertinent CV history: Admitted 07/08/24 after presenting with shortness of breath after recent shoulder surgery. O2 sats 88% on room air. CTPE negative. hsTn initial 355, downtrended. Echo with preserved EF, no wall motion abnormalities, normal diastolic function. Noted to have RV dysfunction, RAP 3. She was diuresed, etiology felt to be 2/2 RV dysfunction from untreated sleep apnea.  ROS: Denies chest pain, shortness of breath at rest or with normal exertion. No PND, orthopnea, LE edema or unexpected weight gain. No syncope or palpitations. ROS otherwise negative except as noted.   Studies Reviewed: SABRA    EKG:       Physical Exam:   VS:  There were no vitals taken for this visit.   Wt Readings from Last 3 Encounters:  10/07/24 253 lb 8 oz (115 kg)  08/08/24 260 lb (117.9 kg)  07/08/24 261 lb 14.5 oz (118.8 kg)    GEN: Well nourished, well developed in no acute distress HEENT: Normal, moist mucous membranes NECK: No JVD CARDIAC: regular rhythm, normal S1 and S2, no rubs or gallops. No murmur. VASCULAR: Radial and DP pulses 2+ bilaterally. No carotid bruits RESPIRATORY:  Clear to auscultation without rales, wheezing or rhonchi  ABDOMEN: Soft, non-tender, non-distended MUSCULOSKELETAL:  Ambulates independently SKIN: Warm and dry, no edema NEUROLOGIC:  Alert and oriented x 3. No focal neuro deficits noted. PSYCHIATRIC:  Normal affect    ASSESSMENT AND PLAN: .      CV risk counseling and prevention -recommend heart  healthy/Mediterranean diet, with whole grains, fruits, vegetable, fish, lean meats, nuts, and olive oil. Limit salt. -recommend moderate walking, 3-5 times/week for 30-50 minutes each session. Aim for at least 150 minutes/week. Goal should be pace of 3 miles/hours, or walking 1.5 miles in 30 minutes -recommend avoidance of tobacco products. Avoid excess alcohol.  Dispo: ***  Signed, Shelda Bruckner, MD   Shelda Bruckner, MD, PhD, [facility]  [facility]  [place]  Heart & Vascular at [facility] at [facility] [address] [place], [place] [address] [phone]   "

## 2025-01-06 ENCOUNTER — Ambulatory Visit (HOSPITAL_BASED_OUTPATIENT_CLINIC_OR_DEPARTMENT_OTHER): Admitting: Cardiology

## 2025-01-06 NOTE — Progress Notes (Incomplete)
" °  Cardiology Office Note:  .   Date:  01/06/2025  ID:  Brooke Duncan, DOB 11-08-1946, MRN [id number] PCP: Okey Carlin Redbird, MD  [facility] Providers Cardiologist:  Shelda Bruckner, MD {  History of Present Illness: .   Brooke Duncan is a 79 y.o. female with PMH breast cancer, hypertension, OSA not on CPAP, renal cell carcinoma s/p nephrectomy. She was previously seen by [facility] and established care with me on 01/06/25.  Pertinent CV history: Admitted 07/08/24 after presenting with shortness of breath after recent shoulder surgery. O2 sats 88% on room air. CTPE negative. hsTn initial 355, downtrended. Echo with preserved EF, no wall motion abnormalities, normal diastolic function. Noted to have RV dysfunction, RAP 3. She was diuresed, etiology felt to be 2/2 RV dysfunction from untreated sleep apnea.  ROS: Denies chest pain, shortness of breath at rest or with normal exertion. No PND, orthopnea, LE edema or unexpected weight gain. No syncope or palpitations. ROS otherwise negative except as noted.   Studies Reviewed: SABRA    EKG:       Physical Exam:   VS:  There were no vitals taken for this visit.   Wt Readings from Last 3 Encounters:  10/07/24 253 lb 8 oz (115 kg)  08/08/24 260 lb (117.9 kg)  07/08/24 261 lb 14.5 oz (118.8 kg)    GEN: Well nourished, well developed in no acute distress HEENT: Normal, moist mucous membranes NECK: No JVD CARDIAC: regular rhythm, normal S1 and S2, no rubs or gallops. No murmur. VASCULAR: Radial and DP pulses 2+ bilaterally. No carotid bruits RESPIRATORY:  Clear to auscultation without rales, wheezing or rhonchi  ABDOMEN: Soft, non-tender, non-distended MUSCULOSKELETAL:  Ambulates independently SKIN: Warm and dry, no edema NEUROLOGIC:  Alert and oriented x 3. No focal neuro deficits noted. PSYCHIATRIC:  Normal affect    ASSESSMENT AND PLAN: .      CV risk counseling and prevention -recommend heart  healthy/Mediterranean diet, with whole grains, fruits, vegetable, fish, lean meats, nuts, and olive oil. Limit salt. -recommend moderate walking, 3-5 times/week for 30-50 minutes each session. Aim for at least 150 minutes/week. Goal should be pace of 3 miles/hours, or walking 1.5 miles in 30 minutes -recommend avoidance of tobacco products. Avoid excess alcohol.  Dispo: ***  Signed, Shelda Bruckner, MD   Shelda Bruckner, MD, PhD, [facility]  [facility]  [place]  Heart & Vascular at [facility] at [facility] [address], [address] [place], [place] [address] [phone]   "

## 2025-01-14 ENCOUNTER — Other Ambulatory Visit (HOSPITAL_BASED_OUTPATIENT_CLINIC_OR_DEPARTMENT_OTHER)

## 2025-01-30 ENCOUNTER — Ambulatory Visit: Admitting: Urgent Care

## 2025-03-18 ENCOUNTER — Ambulatory Visit (HOSPITAL_BASED_OUTPATIENT_CLINIC_OR_DEPARTMENT_OTHER): Admitting: Cardiology

## 2025-10-07 ENCOUNTER — Inpatient Hospital Stay: Admitting: Hematology and Oncology
